# Patient Record
Sex: Male | Born: 1965 | Race: White | Hispanic: No | Marital: Married | Smoking: Never smoker
Health system: Southern US, Community
[De-identification: ages and names within clinical notes are randomized; demographics above are authoritative.]

## PROBLEM LIST (undated history)

## (undated) DIAGNOSIS — D700 Congenital agranulocytosis: Secondary | ICD-10-CM

## (undated) DIAGNOSIS — K219 Gastro-esophageal reflux disease without esophagitis: Secondary | ICD-10-CM

## (undated) DIAGNOSIS — T7840XA Allergy, unspecified, initial encounter: Secondary | ICD-10-CM

## (undated) DIAGNOSIS — Z79899 Other long term (current) drug therapy: Secondary | ICD-10-CM

## (undated) DIAGNOSIS — G546 Phantom limb syndrome with pain: Secondary | ICD-10-CM

## (undated) DIAGNOSIS — I10 Essential (primary) hypertension: Secondary | ICD-10-CM

## (undated) HISTORY — DX: Other long term (current) drug therapy: Z79.899

## (undated) HISTORY — DX: Congenital agranulocytosis: D70.0

## (undated) HISTORY — DX: Gastro-esophageal reflux disease without esophagitis: K21.9

## (undated) HISTORY — PX: AMPUTATION: SHX166

## (undated) HISTORY — DX: Allergy, unspecified, initial encounter: T78.40XA

## (undated) HISTORY — DX: Phantom limb syndrome with pain: G54.6

## (undated) HISTORY — DX: Essential (primary) hypertension: I10

## (undated) HISTORY — PX: APPENDECTOMY: SHX54

---

## 1990-08-27 DIAGNOSIS — D700 Congenital agranulocytosis: Secondary | ICD-10-CM | POA: Insufficient documentation

## 1991-08-02 HISTORY — PX: LEG AMPUTATION THROUGH KNEE: SHX696

## 1991-08-02 HISTORY — PX: ABOVE ELBOW ARM AMPUTATION: SUR27

## 1992-08-01 HISTORY — PX: COLON SURGERY: SHX602

## 2013-08-21 ENCOUNTER — Ambulatory Visit: Payer: Self-pay | Admitting: Family Medicine

## 2014-09-24 DIAGNOSIS — J029 Acute pharyngitis, unspecified: Secondary | ICD-10-CM | POA: Diagnosis not present

## 2014-10-14 DIAGNOSIS — D700 Congenital agranulocytosis: Secondary | ICD-10-CM | POA: Diagnosis not present

## 2014-12-17 DIAGNOSIS — J302 Other seasonal allergic rhinitis: Secondary | ICD-10-CM | POA: Diagnosis not present

## 2014-12-17 DIAGNOSIS — K219 Gastro-esophageal reflux disease without esophagitis: Secondary | ICD-10-CM | POA: Diagnosis not present

## 2014-12-17 DIAGNOSIS — I10 Essential (primary) hypertension: Secondary | ICD-10-CM | POA: Diagnosis not present

## 2014-12-17 DIAGNOSIS — D700 Congenital agranulocytosis: Secondary | ICD-10-CM | POA: Diagnosis not present

## 2014-12-17 DIAGNOSIS — G547 Phantom limb syndrome without pain: Secondary | ICD-10-CM | POA: Diagnosis not present

## 2015-02-22 ENCOUNTER — Other Ambulatory Visit: Payer: Self-pay | Admitting: Family Medicine

## 2015-03-10 DIAGNOSIS — H5500 Unspecified nystagmus: Secondary | ICD-10-CM | POA: Diagnosis not present

## 2015-03-31 ENCOUNTER — Other Ambulatory Visit: Payer: Self-pay | Admitting: Family Medicine

## 2015-04-22 ENCOUNTER — Ambulatory Visit: Payer: Self-pay | Admitting: Family Medicine

## 2015-04-27 ENCOUNTER — Ambulatory Visit (INDEPENDENT_AMBULATORY_CARE_PROVIDER_SITE_OTHER): Payer: Commercial Managed Care - HMO | Admitting: Family Medicine

## 2015-04-27 ENCOUNTER — Encounter (INDEPENDENT_AMBULATORY_CARE_PROVIDER_SITE_OTHER): Payer: Self-pay

## 2015-04-27 ENCOUNTER — Encounter: Payer: Self-pay | Admitting: Family Medicine

## 2015-04-27 VITALS — BP 130/80 | HR 73 | Temp 98.8°F | Resp 16 | Ht 68.0 in | Wt 172.0 lb

## 2015-04-27 DIAGNOSIS — I10 Essential (primary) hypertension: Secondary | ICD-10-CM | POA: Diagnosis not present

## 2015-04-27 DIAGNOSIS — Z23 Encounter for immunization: Secondary | ICD-10-CM | POA: Diagnosis not present

## 2015-04-27 DIAGNOSIS — K219 Gastro-esophageal reflux disease without esophagitis: Secondary | ICD-10-CM | POA: Insufficient documentation

## 2015-04-27 DIAGNOSIS — J302 Other seasonal allergic rhinitis: Secondary | ICD-10-CM

## 2015-04-27 MED ORDER — ENALAPRIL MALEATE 5 MG PO TABS: 5.0000 mg | ORAL_TABLET | Freq: Every day | ORAL | Status: DC

## 2015-04-27 NOTE — Patient Instructions (Signed)
Continue current meds 

## 2015-04-27 NOTE — Progress Notes (Signed)
Name: Brandon Myers   MRN: [id number]    DOB: [birth year]   Date:04/27/2015       Progress Note  Subjective  Chief Complaint  Chief Complaint  Patient presents with  . Hypertension    HPI  Here for f/u of HBP.  Not checked at ome.  He is taking BP med. And feels well.  Phantom pain still doing ok with Gabapentin. No problem-specific assessment & plan notes found for this encounter.   Past Medical History  Diagnosis Date  . Hypertension   . Allergy   . GERD (gastroesophageal reflux disease)   . Neutropenia, congenital     Social History  Substance Use Topics  . Smoking status: Never Smoker   . Smokeless tobacco: Never Used  . Alcohol Use: No     Current outpatient prescriptions:  .  acetaminophen-codeine (TYLENOL #3) 300-30 MG per tablet, Take 1 tablet by mouth at bedtime., Disp: , Rfl: 3 .  doxylamine, Sleep, (UNISOM) 25 MG tablet, Take 25 mg by mouth at bedtime., Disp: , Rfl:  .  enalapril (VASOTEC) 5 MG tablet, TAKE 1 TABLET BY MOUTH DAILY., Disp: 30 tablet, Rfl: 6 .  Filgrastim-sndz (ZARXIO) 480 MCG/0.8ML SOSY, Inject 0.8 mLs into the muscle every other day., Disp: , Rfl:  .  fluticasone (FLONASE) 50 MCG/ACT nasal spray, Place 2 sprays into both nostrils daily., Disp: , Rfl:  .  gabapentin (NEURONTIN) 800 MG tablet, TAKE 1 TABLET BY MOUTH THREE TIMES DAILY, Disp: 90 tablet, Rfl: 6 .  omeprazole (PRILOSEC) 40 MG capsule, Take 40 mg by mouth daily., Disp: , Rfl:  .  VENTOLIN HFA 108 (90 BASE) MCG/ACT inhaler, Inhale 2 puffs into the lungs as needed., Disp: , Rfl: 5  Allergies  Allergen Reactions  . Prochlorperazine     Other reaction(s): Other (See Comments) As a child unable to recall  . Sulfa Antibiotics     Other reaction(s): Other (See Comments) As a child unable to recall  . Strawberry Rash    Review of Systems  Constitutional: Negative for fever, chills, weight loss and malaise/fatigue.  HENT: Negative for hearing loss.   Eyes: Negative for blurred vision  and double vision.  Respiratory: Negative for cough, sputum production, shortness of breath and wheezing.   Cardiovascular: Negative for chest pain, palpitations, orthopnea and leg swelling.  Gastrointestinal: Negative for heartburn, nausea, vomiting, abdominal pain and blood in stool.  Genitourinary: Negative for dysuria, urgency and frequency.  Neurological: Negative for weakness and headaches.      Objective  Filed Vitals:   04/27/15 1340  BP: 156/83  Pulse: 73  Temp: 98.8 F (37.1 C)  TempSrc: Oral  Resp: 16  Height:  (1.727 m)  Weight: 172 lb (78.019 kg)     Physical Exam  Constitutional: He is oriented to person, place, and time and well-developed, well-nourished, and in no distress. No distress.  HENT:  Head: Normocephalic and atraumatic.  Eyes: Conjunctivae and EOM are normal. Pupils are equal, round, and reactive to light. No scleral icterus.  Neck: Normal range of motion. Neck supple. Carotid bruit is not present. No thyromegaly present.  Cardiovascular: Normal rate, regular rhythm, normal heart sounds and intact distal pulses.  Exam reveals no gallop and no friction rub.   No murmur heard. Pulmonary/Chest: Effort normal and breath sounds normal. No respiratory distress. He has no wheezes. He has no rales.  Abdominal: Soft. Bowel sounds are normal. He exhibits no distension, no abdominal bruit and no mass.  There is no tenderness.  Musculoskeletal: He exhibits no edema (Left).  R. Arm surgically absent above the elbow.  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  Lymphadenopathy:    He has no cervical adenopathy.  Neurological: He is alert and oriented to person, place, and time.  Vitals reviewed.     No results found for this or any previous visit (from the past 2160 hour(s)).   Assessment & Plan  1. Essential hypertension, malignant  - enalapril (VASOTEC) 5 MG tablet; Take 1 tablet (5 mg total) by mouth daily.  Dispense: 90  tablet; Refill: 3  2. Seasonal allergic rhinitis -stable  3. Gastroesophageal reflux disease, esophagitis presence not specified -cont. Current meds.  4. Need for influenza vaccination  - Flu Vaccine QUAD 36+ mos PF IM (Fluarix & Fluzone Quad PF)

## 2015-06-09 ENCOUNTER — Other Ambulatory Visit: Payer: Self-pay | Admitting: Family Medicine

## 2015-07-15 ENCOUNTER — Ambulatory Visit (INDEPENDENT_AMBULATORY_CARE_PROVIDER_SITE_OTHER): Payer: Commercial Managed Care - HMO | Admitting: Family Medicine

## 2015-07-15 ENCOUNTER — Encounter: Payer: Self-pay | Admitting: Family Medicine

## 2015-07-15 VITALS — BP 152/92 | HR 68 | Temp 97.5°F | Resp 16 | Ht 68.0 in | Wt 176.0 lb

## 2015-07-15 DIAGNOSIS — J069 Acute upper respiratory infection, unspecified: Secondary | ICD-10-CM

## 2015-07-15 DIAGNOSIS — I10 Essential (primary) hypertension: Secondary | ICD-10-CM | POA: Diagnosis not present

## 2015-07-15 DIAGNOSIS — J209 Acute bronchitis, unspecified: Secondary | ICD-10-CM

## 2015-07-15 MED ORDER — OXYMETAZOLINE HCL 0.05 % NA SOLN: 2.0000 | Freq: Two times a day (BID) | NASAL | Status: DC

## 2015-07-15 MED ORDER — BENZONATATE 100 MG PO CAPS: 100.0000 mg | ORAL_CAPSULE | Freq: Three times a day (TID) | ORAL | Status: DC | PRN

## 2015-07-15 MED ORDER — AZITHROMYCIN 250 MG PO TABS: ORAL_TABLET | ORAL | Status: DC

## 2015-07-15 NOTE — Assessment & Plan Note (Addendum)
Bp elevated likely due to systemic decongestant use. Pt encouraged to watch blood pressure carefully when using any sort of decongestant. Advised topically decongestant only such as Afrin.

## 2015-07-15 NOTE — Progress Notes (Signed)
Subjective:    Patient ID: Brandon Myers, male    DOB: 1966-07-23, 48 y.o.   MRN: [id number]  HPI: Brandon Myers is a 49 y.o. male presenting on 07/15/2015 for Nasal Congestion   HPI  Pt presents for nasal congestion and chest tightness. Symptoms began about last Monday (12/5)- first symptom was sinus drainage and congestion. Facial pressure and pain- relieved by sudafed. He feels symptoms have progressed to his chest- hard to take deep breath, chest is tight. No SOB.  Lots of sinus drainage. Some dry cough.  Home treatment: Sudafed, wal-tussin chest congestion.  Mild relief.   Past Medical History  Diagnosis Date  . Hypertension   . Allergy   . GERD (gastroesophageal reflux disease)   . Neutropenia, congenital (HCC)     Current Outpatient Prescriptions on File Prior to Visit  Medication Sig  . acetaminophen-codeine (TYLENOL #3) 300-30 MG tablet TAKE 1 TABLET BY MOUTH THREE TIMES DAILY AS NEEDED.  Marland Kitchen doxylamine, Sleep, (UNISOM) 25 MG tablet Take 25 mg by mouth at bedtime.  . enalapril (VASOTEC) 5 MG tablet Take 1 tablet (5 mg total) by mouth daily.  Seward Speck (ZARXIO) 480 MCG/0.8ML SOSY Inject 0.8 mLs into the muscle every other day.  . fluticasone (FLONASE) 50 MCG/ACT nasal spray Place 2 sprays into both nostrils daily.  Marland Kitchen gabapentin (NEURONTIN) 800 MG tablet TAKE 1 TABLET BY MOUTH THREE TIMES DAILY  . omeprazole (PRILOSEC) 40 MG capsule Take 40 mg by mouth daily.  . VENTOLIN HFA 108 (90 BASE) MCG/ACT inhaler Inhale 2 puffs into the lungs as needed.   No current facility-administered medications on file prior to visit.    Review of Systems  Constitutional: Negative for fever and chills.  HENT: Positive for congestion, rhinorrhea, sinus pressure and sore throat. Negative for ear pain, postnasal drip and sneezing.   Respiratory: Positive for cough and chest tightness. Negative for shortness of breath and wheezing.   Cardiovascular: Negative for chest pain, palpitations  and leg swelling.  Gastrointestinal: Negative for nausea and vomiting.  Musculoskeletal: Negative for neck pain and neck stiffness.  Skin: Negative.   Allergic/Immunologic: Positive for environmental allergies.  Neurological: Negative for headaches.   Per HPI unless specifically indicated above     Objective:    BP 152/92 mmHg  Pulse 68  Temp(Src) 97.5 F (36.4 C) (Oral)  Resp 16  Ht  (1.727 m)  Wt 176 lb (79.833 kg)  BMI 26.77 kg/m2  Wt Readings from Last 3 Encounters:  07/15/15 176 lb (79.833 kg)  04/27/15 172 lb (78.019 kg)    Physical Exam  Constitutional: He appears well-developed and well-nourished. No distress.  HENT:  Head: Normocephalic and atraumatic.  Right Ear: Hearing and tympanic membrane normal. Tympanic membrane is not erythematous and not bulging.  Left Ear: Hearing and tympanic membrane normal. Tympanic membrane is not erythematous and not bulging.  Nose: Mucosal edema and rhinorrhea present. No sinus tenderness. Right sinus exhibits no maxillary sinus tenderness and no frontal sinus tenderness. Left sinus exhibits no maxillary sinus tenderness and no frontal sinus tenderness.  Mouth/Throat: Uvula is midline and mucous membranes are normal. No uvula swelling. Posterior oropharyngeal erythema present. No posterior oropharyngeal edema.  Neck: Normal range of motion. Neck supple. No Brudzinski's sign and no Kernig's sign noted.  Cardiovascular: Normal rate, regular rhythm and normal heart sounds.   Pulmonary/Chest: No accessory muscle usage. No tachypnea. No respiratory distress. He has no wheezes. He has rhonchi (clear with coughing. )  in the right lower field and the left lower field. He has no rales.  Lymphadenopathy:    He has no cervical adenopathy.   No results found for this or any previous visit.    Assessment & Plan:   Problem List Items Addressed This Visit      Cardiovascular and Mediastinum   Essential hypertension, malignant    Bp  elevated likely due to systemic decongestant use. Pt encouraged to watch blood pressure carefully when using any sort of decongestant. Advised topically decongestant only such as Afrin.        Other Visit Diagnoses    Acute bronchitis, unspecified organism    -  Primary    Given duration of symptoms and clinical exam: treat with antibiotic- Zpak. Supportive care at home. Inhaler for chest tightness. Alarm symptoms reviewed.    Upper respiratory infection        Advised againt systemic decongestant due to history of hypertension. Topical decongestant such as AFrin suggested.     Relevant Medications    azithromycin (ZITHROMAX) 250 MG tablet       Meds ordered this encounter  Medications  . azithromycin (ZITHROMAX) 250 MG tablet    Sig: Dispense as Zpak: Take 2 pills today and 1 pill everyday until the bottle is empty.    Dispense:  6 tablet    Refill:  0    Order Specific Question:  Supervising Provider    Answer:  Janeann ForehandHAWKINS JR, JAMES H [[id number]]  . benzonatate (TESSALON) 100 MG capsule    Sig: Take 1 capsule (100 mg total) by mouth 3 (three) times daily as needed for cough.    Dispense:  30 capsule    Refill:  0    Order Specific Question:  Supervising Provider    Answer:  Janeann ForehandHAWKINS JR, JAMES H [[id number]]  . oxymetazoline (AFRIN NASAL SPRAY) 0.05 % nasal spray    Sig: Place 2 sprays into both nostrils 2 (two) times daily.    Dispense:  30 mL    Refill:  0    Order Specific Question:  Supervising Provider    Answer:  Janeann ForehandHAWKINS JR, JAMES H [[id number]]      Follow up plan: Return if symptoms worsen or fail to improve.

## 2015-07-15 NOTE — Patient Instructions (Signed)
You can use supportive care at home to help with your symptoms.You have generic Mucinex DM at home to help break up the congestion and soothe your cough. You can takes this twice daily.  I have also sent tesslon perles to your pharmacy to help with the cough- you can take these 3 times daily as needed. Honey is a natural cough suppressant- so add it to your tea in the morning.  If you have a humidifer, set that up in your bedroom at night.   Please seek immediate medical attention if you develop shortness of breath not relieve by inhaler, chest pain/tightness, fever > 103 F or other concerning symptoms.

## 2015-07-20 ENCOUNTER — Other Ambulatory Visit: Payer: Self-pay | Admitting: Family Medicine

## 2015-07-20 NOTE — Telephone Encounter (Signed)
Patient completed abx. Patient would like to be seen again. Patient scheduled for 07/21/15.

## 2015-07-20 NOTE — Telephone Encounter (Signed)
Pt. Wife  called wanted to  Know if  Pt could get something stronger    Pt was given  A z-pac pt call back # is  [phone]

## 2015-07-20 NOTE — Telephone Encounter (Signed)
Please let him know that the Zpak is the best antibiotic for the symptoms he presented with- it will cover for most lung infections including pneumonia. It is still active in his system for 5 days after he finishes the antibiotic- he should have finished today. If he is not having a fever, the infection is likely gone. The cough and congestion can linger after the infection is gone. Continue with mucinex DM OTC for cough. Humified air.  If he develops a fever, chest tightness despite inhaler use, he probably needs to be seen again. If symptoms are worsening or not better on Wednesday/thursday he may need to be seen again prior to the holiday.  Thanks! AK

## 2015-07-21 ENCOUNTER — Ambulatory Visit
Admission: RE | Admit: 2015-07-21 | Discharge: 2015-07-21 | Disposition: A | Discharge status: Home / Self Care | Payer: Commercial Managed Care - HMO | Source: Ambulatory Visit | Attending: Family Medicine | Admitting: Family Medicine

## 2015-07-21 ENCOUNTER — Ambulatory Visit (INDEPENDENT_AMBULATORY_CARE_PROVIDER_SITE_OTHER): Payer: Commercial Managed Care - HMO | Admitting: Family Medicine

## 2015-07-21 ENCOUNTER — Encounter: Payer: Self-pay | Admitting: Family Medicine

## 2015-07-21 DIAGNOSIS — R059 Cough, unspecified: Secondary | ICD-10-CM

## 2015-07-21 DIAGNOSIS — R0602 Shortness of breath: Secondary | ICD-10-CM | POA: Diagnosis not present

## 2015-07-21 DIAGNOSIS — D700 Congenital agranulocytosis: Secondary | ICD-10-CM

## 2015-07-21 DIAGNOSIS — J9811 Atelectasis: Secondary | ICD-10-CM | POA: Diagnosis not present

## 2015-07-21 DIAGNOSIS — R05 Cough: Secondary | ICD-10-CM

## 2015-07-21 MED ORDER — LEVOFLOXACIN 500 MG PO TABS: 500.0000 mg | ORAL_TABLET | Freq: Every day | ORAL | Status: DC

## 2015-07-21 MED ORDER — PREDNISONE 20 MG PO TABS: 40.0000 mg | ORAL_TABLET | Freq: Every day | ORAL | Status: DC

## 2015-07-21 NOTE — Progress Notes (Signed)
Subjective:    Patient ID: Brandon GrapesKevin L Eddie, male    DOB: 06/08/1966, 49 y.o.   MRN: [id number]  HPI: Brandon Myers is a 49 y.o. male presenting on 07/21/2015 for Cough   HPI  Pt was seen last week for cough/congestion bronchitis. Finished Zpak on Sunday. Still c/o shortness of breath. Cough with productive sputum- brown. Fevers unknown. Given inhaler last week- 4 times used yesterday and twice today. Using mucinex and tessalon perles at home.   Past Medical History  Diagnosis Date  . Hypertension   . Allergy   . GERD (gastroesophageal reflux disease)   . Neutropenia, congenital (HCC)     Current Outpatient Prescriptions on File Prior to Visit  Medication Sig  . acetaminophen-codeine (TYLENOL #3) 300-30 MG tablet TAKE 1 TABLET BY MOUTH THREE TIMES DAILY AS NEEDED.  . benzonatate (TESSALON) 100 MG capsule Take 1 capsule (100 mg total) by mouth 3 (three) times daily as needed for cough.  . doxylamine, Sleep, (UNISOM) 25 MG tablet Take 25 mg by mouth at bedtime.  . enalapril (VASOTEC) 5 MG tablet Take 1 tablet (5 mg total) by mouth daily.  Seward Speck. Filgrastim-sndz (ZARXIO) 480 MCG/0.8ML SOSY Inject 0.8 mLs into the muscle every other day.  . fluticasone (FLONASE) 50 MCG/ACT nasal spray Place 2 sprays into both nostrils daily.  Marland Kitchen. gabapentin (NEURONTIN) 800 MG tablet TAKE 1 TABLET BY MOUTH THREE TIMES DAILY  . omeprazole (PRILOSEC) 40 MG capsule Take 40 mg by mouth daily.  Marland Kitchen. oxymetazoline (AFRIN NASAL SPRAY) 0.05 % nasal spray Place 2 sprays into both nostrils 2 (two) times daily.  . VENTOLIN HFA 108 (90 BASE) MCG/ACT inhaler Inhale 2 puffs into the lungs as needed.   No current facility-administered medications on file prior to visit.    Review of Systems  Constitutional: Negative for fever and chills.  HENT: Positive for congestion and sinus pressure. Negative for dental problem, postnasal drip and sore throat.   Respiratory: Positive for cough, chest tightness, shortness of breath and  wheezing.   Cardiovascular: Negative for chest pain, palpitations and leg swelling.  Gastrointestinal: Negative.   Genitourinary: Negative.   Musculoskeletal: Negative for neck pain and neck stiffness.  Neurological: Negative for dizziness, light-headedness and numbness.  Psychiatric/Behavioral: Negative.    Per HPI unless specifically indicated above     Objective:    BP 138/94 mmHg  Pulse 108  Temp(Src) 98.2 F (36.8 C) (Oral)  Resp 16  Ht 5\' 8"  (1.727 m)  Wt 177 lb (80.287 kg)  BMI 26.92 kg/m2  SpO2 98%  Wt Readings from Last 3 Encounters:  07/21/15 177 lb (80.287 kg)  07/15/15 176 lb (79.833 kg)  04/27/15 172 lb (78.019 kg)    Physical Exam  Constitutional: He appears well-developed and well-nourished. No distress.  HENT:  Head: Normocephalic and atraumatic.  Nose: Mucosal edema and rhinorrhea present. Right sinus exhibits no maxillary sinus tenderness and no frontal sinus tenderness. Left sinus exhibits no maxillary sinus tenderness and no frontal sinus tenderness.  Mouth/Throat: Uvula is midline and oropharynx is clear and moist.  Neck: Normal range of motion. Neck supple.  Cardiovascular: Normal rate, regular rhythm and normal heart sounds.  Exam reveals no gallop and no friction rub.   No murmur heard. Pulmonary/Chest: No respiratory distress. He has decreased breath sounds in the right middle field and the right lower field. He has wheezes in the right upper field. He has no rhonchi. He has no rales. Chest wall is dull to  percussion (RLL). He exhibits no mass and no tenderness.  Lymphadenopathy:    He has no cervical adenopathy.  Skin: He is diaphoretic.  Psychiatric: His speech is normal and behavior is normal. Judgment and thought content normal. His mood appears anxious. Cognition and memory are normal.   No results found for this or any previous visit.    Assessment & Plan:   Problem List Items Addressed This Visit      Other   Congenital neutropenia  (HCC)    Other Visit Diagnoses    Cough        Prednisone  once daily for cough and inflammation.     Relevant Orders    DG Chest 2 View (Completed)    Atelectasis of right lung        CXR shows atelectatsis of R lung. Given decreased lung sounds and increasing sputum and immunosupression, treat for pneumonia. Discussed risks of Levaquin administration so close to finishing Zpak. Medication discussed with clinical pharmacist at [facility]- risk is still present, however given clinical presentation we will treat. Risk and symptoms to watch out for discussed with patient.   Risks vs. Benefits of levaquin in this case discussed with supervising physician.     Relevant Medications    predniSONE (DELTASONE) 20 MG tablet    levofloxacin (LEVAQUIN) 500 MG tablet    Other Relevant Orders    DG Chest 2 View (Completed)       Meds ordered this encounter  Medications  . predniSONE (DELTASONE) 20 MG tablet    Sig: Take 2 tablets (40 mg total) by mouth daily with breakfast.    Dispense:  10 tablet    Refill:  0    Order Specific Question:  Supervising Provider    Answer:  Janeann Forehand [phone]  . levofloxacin (LEVAQUIN) 500 MG tablet    Sig: Take 1 tablet (500 mg total) by mouth daily.    Dispense:  7 tablet    Refill:  0    Order Specific Question:  Supervising Provider    Answer:  Janeann Forehand [[id number]]      Follow up plan: Return if symptoms worsen or fail to improve.

## 2015-07-21 NOTE — Patient Instructions (Signed)
Please seek immediate medical attention if you develop shortness of breath not relieve by inhaler, chest pain/tightness, fever > 103 F or other concerning symptoms.   Please seek immediate medical attention at ER or Urgent Care if you develop: Chest pain, pressure or tightness. Shortness of breath accompanied by nausea or diaphoresis Visual changes Numbness or tingling on one side of the body Facial droop Altered mental status Or any concerning symptoms.  Please be aware of the risk of taking levaquin so close to the azithromycin. It can cause funny heart rhythms. I have spoken with a pharmacist and it should be okay taken together but there is still a risk. If you develop chest pain, SOB, dizziness or pass out, please go to the ER.

## 2015-08-12 ENCOUNTER — Telehealth: Payer: Self-pay | Admitting: Family Medicine

## 2015-08-12 NOTE — Telephone Encounter (Signed)
Pt needs a refill on acetaminophem-codeine [place] [facility] [place].  He is out

## 2015-08-12 NOTE — Telephone Encounter (Signed)
Let patient know that Dr. Juanetta GoslingHawkins out of office today.

## 2015-08-13 MED ORDER — ACETAMINOPHEN-CODEINE #3 300-30 MG PO TABS: 1.0000 | ORAL_TABLET | Freq: Two times a day (BID) | ORAL | Status: DC

## 2015-08-13 NOTE — Telephone Encounter (Signed)
I will write limited supply until he conmes in for next office visit.-jh

## 2015-08-13 NOTE — Telephone Encounter (Signed)
Notified patient RX ready for p/u.

## 2015-08-31 ENCOUNTER — Ambulatory Visit (INDEPENDENT_AMBULATORY_CARE_PROVIDER_SITE_OTHER): Payer: Commercial Managed Care - HMO | Admitting: Family Medicine

## 2015-08-31 ENCOUNTER — Encounter: Payer: Self-pay | Admitting: Family Medicine

## 2015-08-31 VITALS — BP 135/80 | HR 82 | Temp 98.2°F | Resp 16 | Ht 68.0 in | Wt 180.0 lb

## 2015-08-31 DIAGNOSIS — Z899 Acquired absence of limb, unspecified: Secondary | ICD-10-CM

## 2015-08-31 DIAGNOSIS — S88919A Complete traumatic amputation of unspecified lower leg, level unspecified, initial encounter: Secondary | ICD-10-CM | POA: Insufficient documentation

## 2015-08-31 DIAGNOSIS — K219 Gastro-esophageal reflux disease without esophagitis: Secondary | ICD-10-CM | POA: Diagnosis not present

## 2015-08-31 DIAGNOSIS — I10 Essential (primary) hypertension: Secondary | ICD-10-CM | POA: Diagnosis not present

## 2015-08-31 DIAGNOSIS — M778 Other enthesopathies, not elsewhere classified: Secondary | ICD-10-CM | POA: Diagnosis not present

## 2015-08-31 DIAGNOSIS — Z89201 Acquired absence of right upper limb, unspecified level: Secondary | ICD-10-CM

## 2015-08-31 DIAGNOSIS — S48911A Complete traumatic amputation of right shoulder and upper arm, level unspecified, initial encounter: Secondary | ICD-10-CM | POA: Insufficient documentation

## 2015-08-31 MED ORDER — ENALAPRIL MALEATE 5 MG PO TABS: 5.0000 mg | ORAL_TABLET | Freq: Every day | ORAL | Status: DC

## 2015-08-31 MED ORDER — ACETAMINOPHEN-CODEINE #3 300-30 MG PO TABS: ORAL_TABLET | ORAL | Status: DC

## 2015-08-31 MED ORDER — OMEPRAZOLE 40 MG PO CPDR: 40.0000 mg | DELAYED_RELEASE_CAPSULE | Freq: Every day | ORAL | Status: DC

## 2015-08-31 MED ORDER — MELOXICAM 15 MG PO TABS: 15.0000 mg | ORAL_TABLET | Freq: Every day | ORAL | Status: DC

## 2015-08-31 NOTE — Progress Notes (Signed)
Name: Brandon Myers   MRN: [id number]    DOB: 04-29-1966   Date:08/31/2015       Progress Note  Subjective  Chief Complaint  Chief Complaint  Patient presents with  . Hypertension    HPI Here for f/u of HBP.  Has hx. 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  No problem-specific assessment & plan notes found for this encounter.   Past Medical History  Diagnosis Date  . Hypertension   . Allergy   . GERD (gastroesophageal reflux disease)   . Neutropenia, congenital [facility])     Past Surgical History  Procedure Laterality Date  . Amputation    . Appendectomy      Family History  Problem Relation Age of Onset  . Heart Problems Mother   . Stroke Maternal Grandmother     Social History   Social History  . Marital Status: Married    Spouse Name: N/A  . Number of Children: N/A  . Years of Education: N/A   Occupational History  . Not on file.   Social History Main Topics  . Smoking status: Never Smoker   . Smokeless tobacco: Never Used  . Alcohol Use: No  . Drug Use: No  . Sexual Activity: Not on file   Other Topics Concern  . Not on file   Social History Narrative     Current outpatient prescriptions:  .  acetaminophen-codeine (TYLENOL #3) 300-30 MG tablet, Take 1 tablet at bedtime as needed for pain, Disp: 30 tablet, Rfl: 4 .  enalapril (VASOTEC) 5 MG tablet, Take 1 tablet (5 mg total) by mouth daily., Disp: 90 tablet, Rfl: 3 .  Filgrastim-sndz (ZARXIO) 480 MCG/0.8ML SOSY, Inject 0.8 mLs into the muscle every other day., Disp: , Rfl:  .  fluticasone (FLONASE) 50 MCG/ACT nasal spray, Place 2 sprays into both nostrils daily., Disp: , Rfl:  .  gabapentin (NEURONTIN) 800 MG tablet, TAKE 1 TABLET BY MOUTH THREE TIMES DAILY, Disp: 90 tablet, Rfl: 6 .  omeprazole (PRILOSEC) 40 MG capsule, Take 1 capsule (40 mg total) by mouth daily., Disp: 90 capsule, Rfl: 3 .  VENTOLIN HFA 108 (90 BASE) MCG/ACT inhaler, Inhale 2 puffs into the lungs as needed., Disp: , Rfl:  5 .  meloxicam (MOBIC) 15 MG tablet, Take 1 tablet (15 mg total) by mouth daily., Disp: 30 tablet, Rfl: 0  Allergies  Allergen Reactions  . Prochlorperazine     Other reaction(s): Other (See Comments) As a child unable to recall  . Sulfa Antibiotics     Other reaction(s): Other (See Comments) As a child unable to recall  . Strawberry Extract Rash     Review of Systems  Constitutional: Negative for fever, chills, weight loss and malaise/fatigue.  HENT: Negative for hearing loss.   Eyes: Negative for blurred vision and double vision.  Respiratory: Negative for cough, shortness of breath and wheezing.   Cardiovascular: Negative for chest pain, palpitations and leg swelling.  Gastrointestinal: Negative for heartburn, abdominal pain and blood in stool.  Genitourinary: Negative for dysuria, urgency and frequency.  Musculoskeletal:       Throbbing pain of R stump after taking prosthesis off.  C/o some pain in L wrist with movement over past 2 weeks.  Skin: Negative for rash.  Neurological: Negative for dizziness, tremors, weakness and headaches.      Objective  Filed Vitals:   08/31/15 1334 08/31/15 1400  BP: 137/91 135/80  Pulse: 82   Temp: 98.2  F (36.8 C)   TempSrc: Oral   Resp: 16   Height:  (1.727 m)   Weight: 180 lb (81.647 kg)     Physical Exam  Constitutional: He is oriented to person, place, and time and well-developed, well-nourished, and in no distress. No distress.  HENT:  Head: Normocephalic and atraumatic.  Eyes: Conjunctivae and EOM are normal. Pupils are equal, round, and reactive to light. No scleral icterus.  Neck: Normal range of motion. Neck supple. Carotid bruit is not present. No thyromegaly present.  Cardiovascular: Normal rate, regular rhythm and normal heart sounds.  Exam reveals no gallop and no friction rub.   No murmur heard. Pulmonary/Chest: Effort normal and breath sounds normal. No respiratory distress. He has no wheezes. He has no  rales.  Abdominal: Soft. Bowel sounds are normal. He exhibits no distension and no mass. There is no tenderness.  Musculoskeletal: He exhibits no edema.  S/p amputation of R arm above elbow and R leg above knee.  Lymphadenopathy:    He has no cervical adenopathy.  Neurological: He is alert and oriented to person, place, and time.  Vitals reviewed.      No results found for this or any previous visit (from the past 2160 hour(s)).   Assessment & Plan  Problem List Items Addressed This Visit      Cardiovascular and Mediastinum   Essential hypertension, malignant   Relevant Medications   enalapril (VASOTEC) 5 MG tablet     Digestive   Esophageal reflux   Relevant Medications   omeprazole (PRILOSEC) 40 MG capsule     Musculoskeletal and Integument   Tendonitis of wrist, left   Relevant Medications   meloxicam (MOBIC) 15 MG tablet     Other   Amputation of arm, right (HCC) - Primary   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Relevant Medications   acetaminophen-codeine (TYLENOL #3) 300-30 MG tablet      Meds ordered this encounter  Medications  . acetaminophen-codeine (TYLENOL #3) 300-30 MG tablet    Sig: Take 1 tablet at bedtime as needed for pain    Dispense:  30 tablet    Refill:  4  . meloxicam (MOBIC) 15 MG tablet    Sig: Take 1 tablet (15 mg total) by mouth daily.    Dispense:  30 tablet    Refill:  0  . enalapril (VASOTEC) 5 MG tablet    Sig: Take 1 tablet (5 mg total) by mouth daily.    Dispense:  90 tablet    Refill:  3  . omeprazole (PRILOSEC) 40 MG capsule    Sig: Take 1 capsule (40 mg total) by mouth daily.    Dispense:  90 capsule    Refill:  3  1. Amputation of arm, right (HCC) Cont. Gabapentin  2. 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- acetaminophen-codeine (TYLENOL #3) 300-30 MG tablet; Take 1 tablet at bedtime as needed for pain  Dispense: 30 tablet; Refill: 4  3. Essential hypertension, malignant  - enalapril (VASOTEC) 5 MG tablet; Take 1 tablet (5 mg total) by mouth daily.   Dispense: 90 tablet; Refill: 3  4. Gastroesophageal reflux disease, esophagitis presence not specified  - omeprazole (PRILOSEC) 40 MG capsule; Take 1 capsule (40 mg total) by mouth daily.  Dispense: 90 capsule; Refill: 3  5. Tendonitis of wrist, left  - meloxicam (MOBIC) 15 MG tablet; Take 1 tablet (15 mg total) by mouth daily.  Dispense: 30 tablet; Refill: 0

## 2015-10-01 ENCOUNTER — Other Ambulatory Visit: Payer: Self-pay | Admitting: Family Medicine

## 2015-10-29 ENCOUNTER — Encounter: Payer: Self-pay | Admitting: Family Medicine

## 2015-10-29 ENCOUNTER — Ambulatory Visit (INDEPENDENT_AMBULATORY_CARE_PROVIDER_SITE_OTHER): Payer: Commercial Managed Care - HMO | Admitting: Family Medicine

## 2015-10-29 VITALS — BP 150/90 | HR 87 | Temp 98.6°F | Resp 16 | Ht 68.0 in | Wt 179.0 lb

## 2015-10-29 DIAGNOSIS — G546 Phantom limb syndrome with pain: Secondary | ICD-10-CM | POA: Insufficient documentation

## 2015-10-29 DIAGNOSIS — R69 Illness, unspecified: Principal | ICD-10-CM

## 2015-10-29 DIAGNOSIS — J111 Influenza due to unidentified influenza virus with other respiratory manifestations: Secondary | ICD-10-CM | POA: Diagnosis not present

## 2015-10-29 DIAGNOSIS — J40 Bronchitis, not specified as acute or chronic: Secondary | ICD-10-CM

## 2015-10-29 MED ORDER — AZITHROMYCIN 250 MG PO TABS: ORAL_TABLET | ORAL | Status: AC

## 2015-10-29 MED ORDER — GABAPENTIN 800 MG PO TABS: 800.0000 mg | ORAL_TABLET | Freq: Three times a day (TID) | ORAL | Status: DC

## 2015-10-29 MED ORDER — OSELTAMIVIR PHOSPHATE 75 MG PO CAPS: 75.0000 mg | ORAL_CAPSULE | Freq: Two times a day (BID) | ORAL | Status: AC

## 2015-10-29 NOTE — Progress Notes (Signed)
Name: Brandon Myers   MRN: [id number]    DOB: 03/25/1966   Date:10/29/2015       Progress Note  Subjective  Chief Complaint  Chief Complaint  Patient presents with  . Cough    Congestion, sinus drainage, yellow phlegm; onset of sx Monday    HPI Feeling weak x 2-3 days.  Some aches and headache and some chills.  No fever.  Cough started 2 days ago.   Some yellow phlegm.  + nasal congestion.  Some scratchy throat.  No problem-specific assessment & plan notes found for this encounter.   Past Medical History  Diagnosis Date  . Hypertension   . Allergy   . GERD (gastroesophageal reflux disease)   . Neutropenia, congenital [facility])     Social History  Substance Use Topics  . Smoking status: Never Smoker   . Smokeless tobacco: Never Used  . Alcohol Use: No     Current outpatient prescriptions:  .  acetaminophen-codeine (TYLENOL #3) 300-30 MG tablet, Take 1 tablet at bedtime as needed for pain, Disp: 30 tablet, Rfl: 4 .  enalapril (VASOTEC) 5 MG tablet, Take 1 tablet (5 mg total) by mouth daily., Disp: 90 tablet, Rfl: 3 .  Filgrastim-sndz (ZARXIO) 480 MCG/0.8ML SOSY, Inject 0.8 mLs into the muscle every other day., Disp: , Rfl:  .  fluticasone (FLONASE) 50 MCG/ACT nasal spray, Place 2 sprays into both nostrils daily., Disp: , Rfl:  .  gabapentin (NEURONTIN) 800 MG tablet, Take 1 tablet (800 mg total) by mouth 3 (three) times daily., Disp: 270 tablet, Rfl: 3 .  meloxicam (MOBIC) 15 MG tablet, TAKE 1 TABLET(15 MG) BY MOUTH DAILY, Disp: 30 tablet, Rfl: 6 .  omeprazole (PRILOSEC) 40 MG capsule, Take 1 capsule (40 mg total) by mouth daily., Disp: 90 capsule, Rfl: 3 .  VENTOLIN HFA 108 (90 BASE) MCG/ACT inhaler, Inhale 2 puffs into the lungs as needed., Disp: , Rfl: 5 .  azithromycin (ZITHROMAX) 250 MG tablet, Take 2 tabs on day 1, hen 1 tab daily on days 2-5, Disp: 6 tablet, Rfl: 0 .  oseltamivir (TAMIFLU) 75 MG capsule, Take 1 capsule (75 mg total) by mouth 2 (two) times daily., Disp: 10  capsule, Rfl: 0  Allergies  Allergen Reactions  . Prochlorperazine     Other reaction(s): Other (See Comments) As a child unable to recall  . Sulfa Antibiotics     Other reaction(s): Other (See Comments) As a child unable to recall  . Strawberry Extract Rash    Review of Systems  Constitutional: Positive for chills and malaise/fatigue. Negative for fever and weight loss.  HENT: Positive for congestion.   Eyes: Negative for blurred vision and double vision.  Respiratory: Positive for cough. Negative for shortness of breath and wheezing.   Cardiovascular: Negative for chest pain, palpitations and leg swelling.  Gastrointestinal: Negative for heartburn, abdominal pain and blood in stool.  Genitourinary: Negative for dysuria, urgency and frequency.  Musculoskeletal: Positive for myalgias.  Skin: Negative for rash.  Neurological: Positive for weakness and headaches.      Objective  Filed Vitals:   10/29/15 1433 10/29/15 1454  BP: 139/101 150/90  Pulse: 87   Temp: 98.6 F (37 C)   TempSrc: Oral   Resp: 16   Height:  (1.727 m)   Weight: 179 lb (81.194 kg)   SpO2: 97%      Physical Exam  Constitutional: He is oriented to person, place, and time and well-developed, well-nourished, and in no  distress. No distress.  HENT:  Head: Normocephalic and atraumatic.  Right Ear: External ear normal.  Left Ear: External ear normal.  Nose: Rhinorrhea (clear) present.  Mouth/Throat: Oropharynx is clear and moist.  Eyes: Conjunctivae and EOM are normal. Pupils are equal, round, and reactive to light.  Cardiovascular: Normal rate, regular rhythm and normal heart sounds.   Pulmonary/Chest: Effort normal and breath sounds normal. No respiratory distress. He has no wheezes. He has no rales.  Some coarseness of breath sounds  Neurological: He is alert and oriented to person, place, and time.  Skin: Skin is warm and dry.  Vitals reviewed.     No results found for this or any  previous visit (from the past 2160 hour(s)).   Assessment & Plan  1. Influenza-like illness  - oseltamivir (TAMIFLU) 75 MG capsule; Take 1 capsule (75 mg total) by mouth 2 (two) times daily.  Dispense: 10 capsule; Refill: 0  2. Bronchitis  - azithromycin (ZITHROMAX) 250 MG tablet; Take 2 tabs on day 1, hen 1 tab daily on days 2-5  Dispense: 6 tablet; Refill: 0  3. Phantom limb pain (HCC)  - gabapentin (NEURONTIN) 800 MG tablet; Take 1 tablet (800 mg total) by mouth 3 (three) times daily.  Dispense: 270 tablet; Refill: 3

## 2015-10-29 NOTE — Patient Instructions (Signed)
Use Tylenol for pain, Mucinex DM for cough and Claritin for congestion.  Drink fluids and rest.

## 2015-11-23 ENCOUNTER — Other Ambulatory Visit: Payer: Self-pay | Admitting: Family Medicine

## 2016-01-07 ENCOUNTER — Ambulatory Visit (INDEPENDENT_AMBULATORY_CARE_PROVIDER_SITE_OTHER): Payer: Commercial Managed Care - HMO | Admitting: Family Medicine

## 2016-01-07 ENCOUNTER — Encounter: Payer: Self-pay | Admitting: Family Medicine

## 2016-01-07 VITALS — BP 125/80 | HR 90 | Temp 98.0°F | Resp 16 | Ht 68.0 in | Wt 185.0 lb

## 2016-01-07 DIAGNOSIS — I10 Essential (primary) hypertension: Secondary | ICD-10-CM

## 2016-01-07 DIAGNOSIS — S88919A Complete traumatic amputation of unspecified lower leg, level unspecified, initial encounter: Secondary | ICD-10-CM

## 2016-01-07 DIAGNOSIS — Z899 Acquired absence of limb, unspecified: Secondary | ICD-10-CM

## 2016-01-07 DIAGNOSIS — G546 Phantom limb syndrome with pain: Secondary | ICD-10-CM

## 2016-01-07 MED ORDER — ACETAMINOPHEN-CODEINE #3 300-30 MG PO TABS
ORAL_TABLET | ORAL | Status: DC
Start: 2016-01-07 — End: 2016-07-19

## 2016-01-07 NOTE — Progress Notes (Signed)
Name: Brandon GrapesKevin L Russum   MRN: [id number]    DOB: 01/15/1966   Date:01/07/2016       Progress Note  Subjective  Chief Complaint  Chief Complaint  Patient presents with  . Hypertension    HPI Here for f/u of HBP and phantom pain.  Phantom pain improved with the Gabapentin, but still having some stump pain .  Takes Tylenol #3 for this about every night for thjis stump pain. No problem-specific assessment & plan notes found for this encounter.   Past Medical History  Diagnosis Date  . Hypertension   . Allergy   . GERD (gastroesophageal reflux disease)   . Neutropenia, congenital [facility](HCC)     Past Surgical History  Procedure Laterality Date  . Amputation    . Appendectomy      Family History  Problem Relation Age of Onset  . Heart Problems Mother   . Stroke Maternal Grandmother     Social History   Social History  . Marital Status: Married    Spouse Name: N/A  . Number of Children: N/A  . Years of Education: N/A   Occupational History  . Not on file.   Social History Main Topics  . Smoking status: Never Smoker   . Smokeless tobacco: Never Used  . Alcohol Use: No  . Drug Use: No  . Sexual Activity: Not on file   Other Topics Concern  . Not on file   Social History Narrative     Current outpatient prescriptions:  .  acetaminophen-codeine (TYLENOL #3) 300-30 MG tablet, Take 1 tablet at bedtime as needed for pain, Disp: 30 tablet, Rfl: 3 .  enalapril (VASOTEC) 5 MG tablet, Take 1 tablet (5 mg total) by mouth daily., Disp: 90 tablet, Rfl: 3 .  Filgrastim-sndz (ZARXIO) 480 MCG/0.8ML SOSY, Inject 0.8 mLs into the muscle every other day., Disp: , Rfl:  .  fluticasone (FLONASE) 50 MCG/ACT nasal spray, INSTILL 1 TO 2 SPRAYS IN EACH NOSTRIL EVERY NIGHT AT BEDTIME, Disp: 16 g, Rfl: 12 .  gabapentin (NEURONTIN) 800 MG tablet, Take 1 tablet (800 mg total) by mouth 3 (three) times daily., Disp: 270 tablet, Rfl: 3 .  meloxicam (MOBIC) 15 MG tablet, TAKE 1 TABLET(15 MG) BY MOUTH  DAILY, Disp: 30 tablet, Rfl: 6 .  omeprazole (PRILOSEC) 40 MG capsule, Take 1 capsule (40 mg total) by mouth daily., Disp: 90 capsule, Rfl: 3 .  VENTOLIN HFA 108 (90 BASE) MCG/ACT inhaler, Inhale 2 puffs into the lungs as needed., Disp: , Rfl: 5  Allergies  Allergen Reactions  . Prochlorperazine     Other reaction(s): Other (See Comments) As a child unable to recall  . Sulfa Antibiotics     Other reaction(s): Other (See Comments) As a child unable to recall  . Strawberry Extract Rash     Review of Systems  Constitutional: Negative for fever, chills, weight loss and malaise/fatigue.  HENT: Negative for hearing loss.   Eyes: Negative for blurred vision and double vision.  Respiratory: Negative for cough, shortness of breath and wheezing.   Cardiovascular: Negative for chest pain, palpitations and leg swelling.  Gastrointestinal: Negative for heartburn, abdominal pain and blood in stool.  Genitourinary: Negative for dysuria, urgency and frequency.  Musculoskeletal:       R leg stump pain after removing prosthesis  Skin: Negative for rash.  Neurological: Negative for dizziness, tremors, weakness and headaches.      Objective  Filed Vitals:   01/07/16 1342 01/07/16 1406  BP:  154/90 125/80  Pulse: 90   Temp: 98 F (36.7 C)   TempSrc: Oral   Resp: 16   Height:  (1.727 m)   Weight: 185 lb (83.915 kg)     Physical Exam  Constitutional: He is oriented to person, place, and time and well-developed, well-nourished, and in no distress. No distress.  HENT:  Head: Normocephalic and atraumatic.  Neck: Normal range of motion. Neck supple. Carotid bruit is not present. No thyromegaly present.  Cardiovascular: Normal rate, regular rhythm and normal heart sounds.  Exam reveals no gallop and no friction rub.   No murmur heard. Pulmonary/Chest: Effort normal and breath sounds normal. No respiratory distress. He has no wheezes. He has no rales.  Musculoskeletal: He exhibits no  edema.  R arm ands leg s/p amputation, with prostehesis of R leg.  Lymphadenopathy:    He has no cervical adenopathy.  Neurological: He is alert and oriented to person, place, and time.  Vitals reviewed.      No results found for this or any previous visit (from the past 2160 hour(s)).   Assessment & Plan  Problem List Items Addressed This Visit      Cardiovascular and Mediastinum   Essential hypertension, malignant     Nervous and Auditory   Phantom limb pain (HCC)     Other   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- Primary   Relevant Medications   acetaminophen-codeine (TYLENOL #3) 300-30 MG tablet      Meds ordered this encounter  Medications  . acetaminophen-codeine (TYLENOL #3) 300-30 MG tablet    Sig: Take 1 tablet at bedtime as needed for pain    Dispense:  30 tablet    Refill:  3    Try to take every other night   1. 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- acetaminophen-codeine (TYLENOL #3) 300-30 MG tablet; Take 1 tablet at bedtime as needed for pain  Dispense: 30 tablet; Refill: 3  2. Essential hypertension, malignant  Cont Vasotec.  3. Phantom limb pain (HCC) Cont. Gabapentin

## 2016-05-10 ENCOUNTER — Ambulatory Visit (INDEPENDENT_AMBULATORY_CARE_PROVIDER_SITE_OTHER): Payer: Commercial Managed Care - HMO | Admitting: Family Medicine

## 2016-05-10 ENCOUNTER — Encounter: Payer: Self-pay | Admitting: Family Medicine

## 2016-05-10 VITALS — BP 140/90 | HR 76 | Temp 98.0°F | Ht 68.0 in | Wt 175.0 lb

## 2016-05-10 DIAGNOSIS — I10 Essential (primary) hypertension: Secondary | ICD-10-CM

## 2016-05-10 DIAGNOSIS — G546 Phantom limb syndrome with pain: Secondary | ICD-10-CM

## 2016-05-10 DIAGNOSIS — Z23 Encounter for immunization: Secondary | ICD-10-CM

## 2016-05-10 MED ORDER — ENALAPRIL MALEATE 10 MG PO TABS: 10.0000 mg | ORAL_TABLET | Freq: Every day | ORAL | 3 refills | Status: DC

## 2016-05-10 MED ORDER — GABAPENTIN 300 MG PO CAPS: ORAL_CAPSULE | ORAL | 3 refills | Status: DC

## 2016-05-10 NOTE — Progress Notes (Signed)
Name: Brandon Myers   MRN: [id number]    DOB: August 14, 1965   Date:05/10/2016       Progress Note  Subjective  Chief Complaint  Chief Complaint  Patient presents with  . Follow-up  . Leg Pain    HPI  Here for f/u of phantom pain in R stump.  Some trouble falling asleep secondary to stinging burning pain in R leg and "foot".  Keeps him from going to sleep.   No problem-specific Assessment & Plan notes found for this encounter.   Past Medical History:  Diagnosis Date  . Allergy   . GERD (gastroesophageal reflux disease)   . Hypertension   . Neutropenia, congenital [facility])     Past Surgical History:  Procedure Laterality Date  . AMPUTATION    . APPENDECTOMY      Family History  Problem Relation Age of Onset  . Heart Problems Mother   . Stroke Maternal Grandmother     Social History   Social History  . Marital status: Married    Spouse name: N/A  . Number of children: N/A  . Years of education: N/A   Occupational History  . Not on file.   Social History Main Topics  . Smoking status: Never Smoker  . Smokeless tobacco: Never Used  . Alcohol use No  . Drug use: No  . Sexual activity: Not on file   Other Topics Concern  . Not on file   Social History Narrative  . No narrative on file     Current Outpatient Prescriptions:  .  acetaminophen-codeine (TYLENOL #3) 300-30 MG tablet, Take 1 tablet at bedtime as needed for pain, Disp: 30 tablet, Rfl: 3 .  enalapril (VASOTEC) 10 MG tablet, Take 1 tablet (10 mg total) by mouth daily., Disp: 90 tablet, Rfl: 3 .  Filgrastim-sndz (ZARXIO) 480 MCG/0.8ML SOSY, Inject 0.8 mLs into the muscle every other day., Disp: , Rfl:  .  fluticasone (FLONASE) 50 MCG/ACT nasal spray, INSTILL 1 TO 2 SPRAYS IN EACH NOSTRIL EVERY NIGHT AT BEDTIME, Disp: 16 g, Rfl: 12 .  gabapentin (NEURONTIN) 800 MG tablet, Take 1 tablet (800 mg total) by mouth 3 (three) times daily., Disp: 270 tablet, Rfl: 3 .  meloxicam (MOBIC) 15 MG tablet, TAKE 1  TABLET(15 MG) BY MOUTH DAILY, Disp: 30 tablet, Rfl: 6 .  omeprazole (PRILOSEC) 40 MG capsule, Take 1 capsule (40 mg total) by mouth daily., Disp: 90 capsule, Rfl: 3 .  VENTOLIN HFA 108 (90 BASE) MCG/ACT inhaler, Inhale 2 puffs into the lungs as needed., Disp: , Rfl: 5 .  gabapentin (NEURONTIN) 300 MG capsule, Take 1 capsule once day in mid day, Disp: 90 capsule, Rfl: 3  Allergies  Allergen Reactions  . Prochlorperazine     Other reaction(s): Other (See Comments) As a child unable to recall  . Sulfa Antibiotics     Other reaction(s): Other (See Comments) As a child unable to recall  . Strawberry Extract Rash     Review of Systems  Constitutional: Negative for chills, diaphoresis, fever and weight loss.  HENT: Negative for hearing loss.   Eyes: Negative for blurred vision and double vision.  Respiratory: Negative for cough, shortness of breath and wheezing.   Cardiovascular: Negative for chest pain, palpitations and leg swelling.  Gastrointestinal: Negative for abdominal pain, constipation, heartburn and nausea.  Genitourinary: Negative for dysuria, frequency and urgency.  Musculoskeletal: Positive for myalgias. Negative for joint pain.  Skin: Negative for rash.  Neurological: Negative for dizziness,  tremors, weakness and headaches.       Phantom pain R leg/"foot"      Objective  Vitals:   05/10/16 1347 05/10/16 1421  BP: (!) 147/99 140/90  Pulse: 76   Temp: 98 F (36.7 C)   TempSrc: Oral   Weight: 175 lb (79.4 kg)   Height: 5\' 8"  (1.727 m)     Physical Exam  Constitutional: He is oriented to person, place, and time and well-developed, well-nourished, and in no distress. No distress.  HENT:  Head: Normocephalic and atraumatic.  Eyes: Conjunctivae and EOM are normal. Pupils are equal, round, and reactive to light. No scleral icterus.  Neck: Normal range of motion. Neck supple. Carotid bruit is not present. No thyromegaly present.  Cardiovascular: Normal rate, regular  rhythm and normal heart sounds.  Exam reveals no gallop and no friction rub.   No murmur heard. Pulmonary/Chest: Effort normal and breath sounds normal. No respiratory distress. He has no wheezes. He has no rales.  Musculoskeletal: He exhibits no edema.  R arm above elbow amputation .  R leg AK amputation with prosthesis.  Lymphadenopathy:    He has no cervical adenopathy.  Neurological: He is alert and oriented to person, place, and time.  Vitals reviewed.     No results found for this or any previous visit (from the past 2160 hour(s)).   Assessment & Plan  Problem List Items Addressed This Visit      Cardiovascular and Mediastinum   Essential hypertension, malignant   Relevant Medications   enalapril (VASOTEC) 10 MG tablet     Nervous and Auditory   Phantom limb pain (HCC) - Primary   Relevant Medications   gabapentin (NEURONTIN) 300 MG capsule    Other Visit Diagnoses    Immunization due       Relevant Orders   Flu Vaccine QUAD 36+ mos PF IM (Fluarix & Fluzone Quad PF)      Meds ordered this encounter  Medications  . enalapril (VASOTEC) 10 MG tablet    Sig: Take 1 tablet (10 mg total) by mouth daily.    Dispense:  90 tablet    Refill:  3  . gabapentin (NEURONTIN) 300 MG capsule    Sig: Take 1 capsule once day in mid day    Dispense:  90 capsule    Refill:  3   1. Essential hypertension, malignant  - enalapril (VASOTEC) 10 MG tablet; Take 1 tablet (10 mg total) by mouth daily.  Dispense: 90 tablet; Refill: 3  2. Phantom limb pain (HCC)  - gabapentin (NEURONTIN) 300 MG capsule; Take 1 capsule once day in mid day  Dispense: 90 capsule; Refill: 3 Add to 800 mg Gabapentin three times a day 3. Immunization due Flu shot

## 2016-06-07 ENCOUNTER — Ambulatory Visit (INDEPENDENT_AMBULATORY_CARE_PROVIDER_SITE_OTHER): Payer: Commercial Managed Care - HMO | Admitting: Family Medicine

## 2016-06-07 ENCOUNTER — Encounter: Payer: Self-pay | Admitting: Family Medicine

## 2016-06-07 VITALS — BP 135/88 | HR 93 | Temp 98.4°F | Resp 16 | Ht 68.0 in | Wt 177.0 lb

## 2016-06-07 DIAGNOSIS — G546 Phantom limb syndrome with pain: Secondary | ICD-10-CM

## 2016-06-07 DIAGNOSIS — I10 Essential (primary) hypertension: Secondary | ICD-10-CM | POA: Diagnosis not present

## 2016-06-07 DIAGNOSIS — J302 Other seasonal allergic rhinitis: Secondary | ICD-10-CM | POA: Diagnosis not present

## 2016-06-07 NOTE — Progress Notes (Signed)
Name: Brandon GrapesKevin L Covell   MRN: [id number]    DOB: 05/17/1966   Date:06/07/2016       Progress Note  Subjective  Chief Complaint  Chief Complaint  Patient presents with  . Hypertension    HPI Here for f/u of HBP.  Also has phantom pain that is pretty well controlled. Overall feeling pretty good. No problem-specific Assessment & Plan notes found for this encounter.   Past Medical History:  Diagnosis Date  . Allergy   . GERD (gastroesophageal reflux disease)   . Hypertension   . Neutropenia, congenital [facility](HCC)     Past Surgical History:  Procedure Laterality Date  . AMPUTATION    . APPENDECTOMY      Family History  Problem Relation Age of Onset  . Heart Problems Mother   . Stroke Maternal Grandmother     Social History   Social History  . Marital status: Married    Spouse name: N/A  . Number of children: N/A  . Years of education: N/A   Occupational History  . Not on file.   Social History Main Topics  . Smoking status: Never Smoker  . Smokeless tobacco: Never Used  . Alcohol use No  . Drug use: No  . Sexual activity: Not on file   Other Topics Concern  . Not on file   Social History Narrative  . No narrative on file     Current Outpatient Prescriptions:  .  acetaminophen-codeine (TYLENOL #3) 300-30 MG tablet, Take 1 tablet at bedtime as needed for pain, Disp: 30 tablet, Rfl: 3 .  enalapril (VASOTEC) 10 MG tablet, Take 1 tablet (10 mg total) by mouth daily., Disp: 90 tablet, Rfl: 3 .  Filgrastim-sndz (ZARXIO) 480 MCG/0.8ML SOSY, Inject 0.8 mLs into the muscle every other day., Disp: , Rfl:  .  fluticasone (FLONASE) 50 MCG/ACT nasal spray, INSTILL 1 TO 2 SPRAYS IN EACH NOSTRIL EVERY NIGHT AT BEDTIME, Disp: 16 g, Rfl: 12 .  gabapentin (NEURONTIN) 300 MG capsule, Take 1 capsule once day in mid day, Disp: 90 capsule, Rfl: 3 .  gabapentin (NEURONTIN) 800 MG tablet, Take 1 tablet (800 mg total) by mouth 3 (three) times daily., Disp: 270 tablet, Rfl: 3 .   meloxicam (MOBIC) 15 MG tablet, TAKE 1 TABLET(15 MG) BY MOUTH DAILY, Disp: 30 tablet, Rfl: 6 .  omeprazole (PRILOSEC) 40 MG capsule, Take 1 capsule (40 mg total) by mouth daily., Disp: 90 capsule, Rfl: 3 .  VENTOLIN HFA 108 (90 BASE) MCG/ACT inhaler, Inhale 2 puffs into the lungs as needed., Disp: , Rfl: 5  Allergies  Allergen Reactions  . Prochlorperazine     Other reaction(s): Other (See Comments) As a child unable to recall  . Sulfa Antibiotics     Other reaction(s): Other (See Comments) As a child unable to recall  . Strawberry Extract Rash     Review of Systems  Constitutional: Negative for chills, fever, malaise/fatigue and weight loss.  HENT: Negative for hearing loss.   Eyes: Negative for blurred vision and double vision.  Respiratory: Negative for cough, shortness of breath and wheezing.   Cardiovascular: Negative for chest pain, palpitations and leg swelling.  Gastrointestinal: Negative for abdominal pain, constipation and heartburn.  Genitourinary: Negative for dysuria, frequency and urgency.  Musculoskeletal: Negative for joint pain and myalgias.  Skin: Negative for rash.  Neurological: Positive for tingling (Phantom pain R arm and R leg). Negative for dizziness, tremors, weakness and headaches.      Objective  Vitals:   06/07/16 1344 06/07/16 1441  BP: (!) 143/98 135/88  Pulse: 93   Resp: 16   Temp: 98.4 F (36.9 C)   TempSrc: Oral   Weight: 177 lb (80.3 kg)   Height: 5\' 8"  (1.727 m)     Physical Exam  Constitutional: He is oriented to person, place, and time and well-developed, well-nourished, and in no distress. No distress.  HENT:  Head: Normocephalic and atraumatic.  Eyes: Conjunctivae and EOM are normal. Pupils are equal, round, and reactive to light. No scleral icterus.  Neck: Normal range of motion. Neck supple. Carotid bruit is not present. No thyromegaly present.  Cardiovascular: Normal rate, regular rhythm and normal heart sounds.  Exam  reveals no gallop and no friction rub.   No murmur heard. Pulmonary/Chest: Effort normal and breath sounds normal. No respiratory distress. He has no wheezes. He has no rales.  Musculoskeletal: He exhibits no edema.  R leg amputated above knee.  R arm amputated above elbow.  Lymphadenopathy:    He has no cervical adenopathy.  Neurological: He is alert and oriented to person, place, and time.  Vitals reviewed.      No results found for this or any previous visit (from the past 2160 hour(s)).   Assessment & Plan  Problem List Items Addressed This Visit      Cardiovascular and Mediastinum   Essential hypertension, malignant - Primary     Respiratory   Seasonal allergic rhinitis     Nervous and Auditory   Phantom limb pain (HCC)      No orders of the defined types were placed in this encounter.  1. Essential hypertension, malignant Cont Enalpril  2. Chronic seasonal allergic rhinitis due to other allergen  Cont meds 3. Phantom limb pain (HCC) cont meds.

## 2016-07-01 ENCOUNTER — Encounter: Payer: Self-pay | Admitting: Family Medicine

## 2016-07-01 ENCOUNTER — Ambulatory Visit (INDEPENDENT_AMBULATORY_CARE_PROVIDER_SITE_OTHER): Payer: Commercial Managed Care - HMO | Admitting: Family Medicine

## 2016-07-01 VITALS — BP 148/96 | HR 74 | Temp 98.4°F | Resp 16 | Ht 68.0 in | Wt 176.0 lb

## 2016-07-01 DIAGNOSIS — H43393 Other vitreous opacities, bilateral: Secondary | ICD-10-CM | POA: Insufficient documentation

## 2016-07-01 DIAGNOSIS — H538 Other visual disturbances: Secondary | ICD-10-CM | POA: Diagnosis not present

## 2016-07-01 NOTE — Progress Notes (Signed)
Subjective:    Patient ID: Brandon GrapesKevin L Hudnall, male    DOB: 11/18/1965, 50 y.o.   MRN: [id number]  Brandon Myers is a 50 y.o. male presenting on 07/01/2016 for Eye Pain (fuzzy looking Right side sister just  diagnosed with  cataract )  Patient presents for a same day appointment.  HPI   RIGHT EYE CLOUDY VISION: - Reports he has had some gradual worsening Right eye cloudy vision recently over past few weeks to months, unclear exact onset. Last saw Optometry in Spring 2017 for new glasses, has worn glasses for long time due to nearsighted vision. He describes current symptoms with "haziness" or "cloudiness" with some worsening night-time vision, with glare from lights, he is concerned about a cataract, as his sister was just recently diagnosed with a rapidly growing cataract, and he wanted to be evaluated. - Admits occasional floaters or "spidery spots" in his vision - Denies any flashes of light, eye pain, loss of vision, persistent blurry vision, trauma or eye injury   Social History  Substance Use Topics  . Smoking status: Never Smoker  . Smokeless tobacco: Never Used  . Alcohol use No    Review of Systems Per HPI unless specifically indicated above     Objective:    BP (!) 148/96   Pulse 74   Temp 98.4 F (36.9 C) (Oral)   Resp 16   Ht 5\' 8"  (1.727 m)   Wt 176 lb (79.8 kg)   BMI 26.76 kg/m   Wt Readings from Last 3 Encounters:  07/01/16 176 lb (79.8 kg)  06/07/16 177 lb (80.3 kg)  05/10/16 175 lb (79.4 kg)    Physical Exam  Constitutional: He appears well-developed and well-nourished. No distress.  Well-appearing, comfortable, cooperative  HENT:  Head: Normocephalic and atraumatic.  Mouth/Throat: Oropharynx is clear and moist.  Eyes: Conjunctivae and EOM are normal. Pupils are equal, round, and reactive to light. Right eye exhibits no discharge. Left eye exhibits no discharge.  No significant haziness or cloudy corneal appearance on direct ophthalmoscope exam today.    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  Skin: Skin is warm and dry. No rash noted. He is not diaphoretic. No erythema.  Psychiatric: His behavior is normal.  Nursing note and vitals reviewed.  No results found for this or any previous visit.     VISUAL ACUITY (07/01/16) in office Corrective Lens B 20/25 L 20/30 R 20/40  Assessment & Plan:   Problem List Items Addressed This Visit    Cloudy vision - Primary    Suspect possible early cataract formation, especially given similar description of sister with same condition. Does not seem to have high risk history for cataracts, no diabetes or other related eye problems. - Previously seen by Optometry  Plan: 1. Referral placed to see [facility] ophthalmology for further evaluation and management of R eye cloudy vision, patient has already scheduled this apt for this coming Monday 07/04/16, and needed referral today per insurance 2. Follow-up as needed      Relevant Orders   Ambulatory referral to Ophthalmology      No orders of the defined types were placed in this encounter.      Follow up plan: Return in about 1 month (around 08/01/2016), or if symptoms worsen or fail to improve, for vision changes.  Saralyn PilarAlexander Emelly Wurtz, DO [facility] 07/01/2016, 5:05 PM

## 2016-07-01 NOTE — Patient Instructions (Addendum)
Thank you for coming in to clinic today.  Referral ordered for [facility]. This should resolve the insurance problem. Let us know if there is anything else you need regarding this referral order  [facility]   [address], [place], [place] [address] Phone: [phone]  Vision today with glasses is good. Both eyes are 20/25, Left eye 20/30 and Right eye 20/40  Please schedule a follow-up appointment with Dr. Althea CharonKaramalegos and Dr Juanetta GoslingHawkins  1 month as needed for blurry vision  If you have any other questions or concerns, please feel free to call the clinic or send a message through MyChart. You may also schedule an earlier appointment if necessary.  Saralyn PilarAlexander Karamalegos, DO [facility]

## 2016-07-01 NOTE — Assessment & Plan Note (Signed)
Suspect possible early cataract formation, especially given similar description of sister with same condition. Does not seem to have high risk history for cataracts, no diabetes or other related eye problems. - Previously seen by Optometry  Plan: 1. Referral placed to see [facility] ophthalmology for further evaluation and management of R eye cloudy vision, patient has already scheduled this apt for this coming Monday 07/04/16, and needed referral today per insurance 2. Follow-up as needed

## 2016-07-04 ENCOUNTER — Telehealth: Payer: Self-pay | Admitting: Family Medicine

## 2016-07-04 DIAGNOSIS — H43393 Other vitreous opacities, bilateral: Secondary | ICD-10-CM | POA: Diagnosis not present

## 2016-07-04 NOTE — Telephone Encounter (Signed)
Pt's approval number is [id number] approved from 07/04/16 till 12/31/16 with 6 visit.

## 2016-07-04 NOTE — Telephone Encounter (Signed)
Pt had appt with Dr. Dellie Burnsingeldein at [facility] today.  They need a [employer] referral sent over before 5:00 today.

## 2016-07-11 ENCOUNTER — Other Ambulatory Visit: Payer: Self-pay | Admitting: Family Medicine

## 2016-07-19 ENCOUNTER — Other Ambulatory Visit: Payer: Self-pay | Admitting: Family Medicine

## 2016-07-19 DIAGNOSIS — S88919A Complete traumatic amputation of unspecified lower leg, level unspecified, initial encounter: Secondary | ICD-10-CM

## 2016-08-29 ENCOUNTER — Ambulatory Visit (INDEPENDENT_AMBULATORY_CARE_PROVIDER_SITE_OTHER): Payer: Commercial Managed Care - HMO | Admitting: Family Medicine

## 2016-08-29 ENCOUNTER — Encounter: Payer: Self-pay | Admitting: Family Medicine

## 2016-08-29 VITALS — BP 134/92 | HR 89 | Temp 98.9°F | Resp 16 | Ht 68.0 in | Wt 178.0 lb

## 2016-08-29 DIAGNOSIS — J069 Acute upper respiratory infection, unspecified: Secondary | ICD-10-CM | POA: Diagnosis not present

## 2016-08-29 DIAGNOSIS — B9789 Other viral agents as the cause of diseases classified elsewhere: Secondary | ICD-10-CM | POA: Diagnosis not present

## 2016-08-29 DIAGNOSIS — J208 Acute bronchitis due to other specified organisms: Secondary | ICD-10-CM | POA: Diagnosis not present

## 2016-08-29 MED ORDER — DM-GUAIFENESIN ER 30-600 MG PO TB12: 1.0000 | ORAL_TABLET | Freq: Two times a day (BID) | ORAL | 1 refills | Status: DC

## 2016-08-29 MED ORDER — LORATADINE 10 MG PO TABS: 10.0000 mg | ORAL_TABLET | Freq: Every day | ORAL | 11 refills | Status: DC

## 2016-08-29 MED ORDER — AZITHROMYCIN 250 MG PO TABS: ORAL_TABLET | ORAL | 0 refills | Status: DC

## 2016-08-29 NOTE — Progress Notes (Signed)
Name: Brandon Myers   MRN: [id number]    DOB: 11/16/1965   Date:08/29/2016       Progress Note  Subjective  Chief Complaint  Chief Complaint  Patient presents with  . URI  . Cough    HPI Here c/o congestion and cough for past 2-3 days.  Started as scratchy throat and some congestion, then cough worsening for past 2 days.  Cough not productive.  No fever or chills.  Mild HA today.  No aches.   Some fatigue.  He had his flu immunization this past fall.  No problem-specific Assessment & Plan notes found for this encounter.   Past Medical History:  Diagnosis Date  . Allergy   . GERD (gastroesophageal reflux disease)   . Hypertension   . Neutropenia, congenital [facility](HCC)     Social History  Substance Use Topics  . Smoking status: Never Smoker  . Smokeless tobacco: Never Used  . Alcohol use No     Current Outpatient Prescriptions:  .  acetaminophen-codeine (TYLENOL #3) 300-30 MG tablet, TAKE 1 TABLET BY MOUTH EVERY NIGHT AT BEDTIME AS NEEDED FOR PAIN(TRY TO TAKE EVERY OTHER NIGHT), Disp: 30 tablet, Rfl: 1 .  enalapril (VASOTEC) 10 MG tablet, Take 1 tablet (10 mg total) by mouth daily., Disp: 90 tablet, Rfl: 3 .  Filgrastim-sndz (ZARXIO) 480 MCG/0.8ML SOSY, Inject 0.8 mLs into the muscle every other day., Disp: , Rfl:  .  fluticasone (FLONASE) 50 MCG/ACT nasal spray, INSTILL 1 TO 2 SPRAYS IN EACH NOSTRIL EVERY NIGHT AT BEDTIME, Disp: 16 g, Rfl: 12 .  gabapentin (NEURONTIN) 300 MG capsule, Take 1 capsule once day in mid day, Disp: 90 capsule, Rfl: 3 .  gabapentin (NEURONTIN) 800 MG tablet, Take 1 tablet (800 mg total) by mouth 3 (three) times daily., Disp: 270 tablet, Rfl: 3 .  meloxicam (MOBIC) 15 MG tablet, TAKE 1 TABLET(15 MG) BY MOUTH DAILY, Disp: 30 tablet, Rfl: 6 .  omeprazole (PRILOSEC) 40 MG capsule, Take 1 capsule (40 mg total) by mouth daily., Disp: 90 capsule, Rfl: 3 .  VENTOLIN HFA 108 (90 Base) MCG/ACT inhaler, INHALE 2 PUFFS BY MOUTH FOUR TIMES DAILY AS NEEDED, Disp: 18 g,  Rfl: 12  Allergies  Allergen Reactions  . Prochlorperazine     Other reaction(s): Other (See Comments) As a child unable to recall  . Sulfa Antibiotics     Other reaction(s): Other (See Comments) As a child unable to recall  . Strawberry Extract Rash    Review of Systems  Constitutional: Positive for malaise/fatigue. Negative for chills, fever and weight loss.  HENT: Positive for congestion and sore throat. Negative for hearing loss, nosebleeds and tinnitus.   Eyes: Negative for blurred vision and double vision.  Respiratory: Positive for cough and shortness of breath (mild occ.). Negative for hemoptysis, sputum production and wheezing.   Cardiovascular: Negative for chest pain, palpitations and leg swelling.  Gastrointestinal: Positive for diarrhea. Negative for abdominal pain, blood in stool and heartburn.  Genitourinary: Negative for dysuria, frequency and urgency.  Musculoskeletal: Negative for joint pain and myalgias.  Skin: Negative for rash.  Neurological: Negative for dizziness, tingling, tremors, weakness and headaches.      Objective  Vitals:   08/29/16 1404  BP: (!) 134/92  Pulse: 89  Resp: 16  Temp: 98.9 F (37.2 C)  TempSrc: Oral  Weight: 178 lb (80.7 kg)  Height: 5\' 8"  (1.727 m)     Physical Exam  Constitutional: He is oriented to person, place,  and time. He appears distressed (appears to feel ill).  HENT:  Head: Normocephalic.  Right Ear: External ear normal.  Left Ear: External ear normal.  Nose: Mucosal edema and rhinorrhea present. Right sinus exhibits no maxillary sinus tenderness and no frontal sinus tenderness. Left sinus exhibits no maxillary sinus tenderness and no frontal sinus tenderness.  Mouth/Throat: Posterior oropharyngeal erythema (mild) present.  Cardiovascular: Normal rate, regular rhythm and normal heart sounds.   Pulmonary/Chest: Effort normal. No respiratory distress. He has no wheezes. He has no rales.  Mild coarse breath sounds  throughout.  Musculoskeletal: He exhibits no edema.  Neurological: He is alert and oriented to person, place, and time.  Skin: Skin is warm and dry.  Vitals reviewed.     No results found for this or any previous visit (from the past 2160 hour(s)).   Assessment & Plan  1. Viral upper respiratory tract infection  - loratadine (CLARITIN) 10 MG tablet; Take 1 tablet (10 mg total) by mouth daily.  Dispense: 30 tablet; Refill: 11  2. Acute viral bronchitis  - azithromycin (ZITHROMAX Z-PAK) 250 MG tablet; Take 2 tablets on day 1, then 1 tablet daily on days 2-5  Dispense: 6 each; Refill: 0 - dextromethorphan-guaiFENesin (MUCINEX DM) 30-600 MG 12hr tablet; Take 1 tablet by mouth 2 (two) times daily.  Dispense: 20 tablet; Refill: 1

## 2016-10-04 ENCOUNTER — Encounter: Payer: Self-pay | Admitting: Family Medicine

## 2016-10-04 ENCOUNTER — Ambulatory Visit (INDEPENDENT_AMBULATORY_CARE_PROVIDER_SITE_OTHER): Payer: Medicare HMO | Admitting: Family Medicine

## 2016-10-04 VITALS — BP 140/95 | HR 74 | Temp 98.2°F | Resp 16 | Ht 68.0 in | Wt 180.0 lb

## 2016-10-04 DIAGNOSIS — G8929 Other chronic pain: Secondary | ICD-10-CM | POA: Insufficient documentation

## 2016-10-04 DIAGNOSIS — K219 Gastro-esophageal reflux disease without esophagitis: Secondary | ICD-10-CM

## 2016-10-04 DIAGNOSIS — R05 Cough: Secondary | ICD-10-CM | POA: Diagnosis not present

## 2016-10-04 DIAGNOSIS — I10 Essential (primary) hypertension: Secondary | ICD-10-CM

## 2016-10-04 DIAGNOSIS — Z89201 Acquired absence of right upper limb, unspecified level: Secondary | ICD-10-CM | POA: Diagnosis not present

## 2016-10-04 DIAGNOSIS — M79604 Pain in right leg: Secondary | ICD-10-CM

## 2016-10-04 DIAGNOSIS — G546 Phantom limb syndrome with pain: Secondary | ICD-10-CM

## 2016-10-04 DIAGNOSIS — T464X5A Adverse effect of angiotensin-converting-enzyme inhibitors, initial encounter: Secondary | ICD-10-CM

## 2016-10-04 DIAGNOSIS — Z89619 Acquired absence of unspecified leg above knee: Secondary | ICD-10-CM | POA: Diagnosis not present

## 2016-10-04 DIAGNOSIS — S88919A Complete traumatic amputation of unspecified lower leg, level unspecified, initial encounter: Secondary | ICD-10-CM

## 2016-10-04 DIAGNOSIS — S48911A Complete traumatic amputation of right shoulder and upper arm, level unspecified, initial encounter: Secondary | ICD-10-CM

## 2016-10-04 MED ORDER — OMEPRAZOLE 40 MG PO CPDR: 40.0000 mg | DELAYED_RELEASE_CAPSULE | Freq: Every day | ORAL | 3 refills | Status: DC

## 2016-10-04 MED ORDER — TRAMADOL HCL 50 MG PO TABS: ORAL_TABLET | ORAL | 5 refills | Status: DC

## 2016-10-04 MED ORDER — LOSARTAN POTASSIUM 50 MG PO TABS: 50.0000 mg | ORAL_TABLET | Freq: Every day | ORAL | 3 refills | Status: DC

## 2016-10-04 MED ORDER — GABAPENTIN 800 MG PO TABS: 800.0000 mg | ORAL_TABLET | Freq: Three times a day (TID) | ORAL | 3 refills | Status: DC

## 2016-10-04 NOTE — Progress Notes (Signed)
Name: Brandon Myers   MRN: [id number]    DOB: 01/22/1966   Date:10/04/2016       Progress Note  Subjective  Chief Complaint  Chief Complaint  Patient presents with  . Hypertension    HPI  Here for f/u of HBP.  He has hx of phantom pain in R upper and lower extremity.   He still gets pain ini R leg stump after taking off prosthesis.  Also c/o cough with laughing and when lying down at night.  Cough is dry and tickling. No problem-specific Assessment & Plan notes found for this encounter.   Past Medical History:  Diagnosis Date  . Allergy   . GERD (gastroesophageal reflux disease)   . Hypertension   . Neutropenia, congenital [facility](HCC)     Past Surgical History:  Procedure Laterality Date  . AMPUTATION    . APPENDECTOMY      Family History  Problem Relation Age of Onset  . Heart Problems Mother   . Stroke Maternal Grandmother     Social History   Social History  . Marital status: Married    Spouse name: N/A  . Number of children: N/A  . Years of education: N/A   Occupational History  . Not on file.   Social History Main Topics  . Smoking status: Never Smoker  . Smokeless tobacco: Never Used  . Alcohol use No  . Drug use: No  . Sexual activity: Not on file   Other Topics Concern  . Not on file   Social History Narrative  . No narrative on file     Current Outpatient Prescriptions:  .  Filgrastim-sndz (ZARXIO) 480 MCG/0.8ML SOSY, Inject 0.8 mLs into the muscle every other day., Disp: , Rfl:  .  fluticasone (FLONASE) 50 MCG/ACT nasal spray, INSTILL 1 TO 2 SPRAYS IN EACH NOSTRIL EVERY NIGHT AT BEDTIME, Disp: 16 g, Rfl: 12 .  gabapentin (NEURONTIN) 300 MG capsule, Take 1 capsule once day in mid day, Disp: 90 capsule, Rfl: 3 .  gabapentin (NEURONTIN) 800 MG tablet, Take 1 tablet (800 mg total) by mouth 3 (three) times daily., Disp: 270 tablet, Rfl: 3 .  loratadine (CLARITIN) 10 MG tablet, Take 1 tablet (10 mg total) by mouth daily., Disp: 30 tablet, Rfl: 11 .   omeprazole (PRILOSEC) 40 MG capsule, Take 1 capsule (40 mg total) by mouth daily., Disp: 90 capsule, Rfl: 3 .  VENTOLIN HFA 108 (90 Base) MCG/ACT inhaler, INHALE 2 PUFFS BY MOUTH FOUR TIMES DAILY AS NEEDED, Disp: 18 g, Rfl: 12 .  losartan (COZAAR) 50 MG tablet, Take 1 tablet (50 mg total) by mouth daily., Disp: 90 tablet, Rfl: 3 .  meloxicam (MOBIC) 15 MG tablet, TAKE 1 TABLET(15 MG) BY MOUTH DAILY (Patient not taking: Reported on 10/04/2016), Disp: 30 tablet, Rfl: 6 .  traMADol (ULTRAM) 50 MG tablet, Take d1 in evening for leg pain, Disp: 30 tablet, Rfl: 5  Allergies  Allergen Reactions  . Prochlorperazine     Other reaction(s): Other (See Comments) As a child unable to recall  . Sulfa Antibiotics     Other reaction(s): Other (See Comments) As a child unable to recall  . Strawberry Extract Rash     Review of Systems  Constitutional: Negative for chills, fever, malaise/fatigue and weight loss.  HENT: Negative for hearing loss and tinnitus.   Eyes: Negative for blurred vision and double vision.  Respiratory: Positive for cough (dry). Negative for sputum production, shortness of breath and wheezing.  Cardiovascular: Negative for chest pain, palpitations and leg swelling.  Gastrointestinal: Negative for abdominal pain, blood in stool and heartburn.  Genitourinary: Negative for dysuria, frequency and urgency.  Musculoskeletal: Positive for myalgias (R stump pain). Negative for joint pain.  Skin: Negative for rash.  Neurological: Negative for dizziness, tingling, tremors, weakness and headaches.      Objective  Vitals:   10/04/16 1344 10/04/16 1426  BP: (!) 153/103 (!) 140/95  Pulse: 74   Resp: 16   Temp: 98.2 F (36.8 C)   TempSrc: Oral   Weight: 180 lb (81.6 kg)   Height: 5\' 8"  (1.727 m)     Physical Exam  Constitutional: He is oriented to person, place, and time and well-developed, well-nourished, and in no distress. No distress.  HENT:  Head: Normocephalic and  atraumatic.  Eyes: Conjunctivae and EOM are normal. Pupils are equal, round, and reactive to light. No scleral icterus.  Neck: Normal range of motion. Neck supple. Carotid bruit is not present. No thyromegaly present.  Cardiovascular: Normal rate, regular rhythm and normal heart sounds.  Exam reveals no gallop and no friction rub.   No murmur heard. Pulmonary/Chest: Effort normal and breath sounds normal. No respiratory distress. He has no wheezes. He has no rales.  Musculoskeletal: He exhibits no edema.  R arm amputated above elbow and R leg amputated above knee with leg prosthesis in place.  Lymphadenopathy:    He has no cervical adenopathy.  Neurological: He is alert and oriented to person, place, and time.  Vitals reviewed.      No results found for this or any previous visit (from the past 2160 hour(s)).   Assessment & Plan  Problem List Items Addressed This Visit      Cardiovascular and Mediastinum   Essential hypertension, malignant - Primary   Relevant Medications   losartan (COZAAR) 50 MG tablet     Digestive   Esophageal reflux   Relevant Medications   omeprazole (PRILOSEC) 40 MG capsule     Nervous and Auditory   Phantom limb pain (HCC)   Relevant Medications   gabapentin (NEURONTIN) 800 MG tablet     Other   Amputation of arm, right   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(HCC)   ACE-inhibitor cough   Chronic pain of right lower extremity   Relevant Medications   gabapentin (NEURONTIN) 800 MG tablet   traMADol (ULTRAM) 50 MG tablet      Meds ordered this encounter  Medications  . omeprazole (PRILOSEC) 40 MG capsule    Sig: Take 1 capsule (40 mg total) by mouth daily.    Dispense:  90 capsule    Refill:  3  . gabapentin (NEURONTIN) 800 MG tablet    Sig: Take 1 tablet (800 mg total) by mouth 3 (three) times daily.    Dispense:  270 tablet    Refill:  3  . losartan (COZAAR) 50 MG tablet    Sig: Take 1 tablet (50 mg total) by mouth daily.    Dispense:  90 tablet     Refill:  3  . traMADol (ULTRAM) 50 MG tablet    Sig: Take d1 in evening for leg pain    Dispense:  30 tablet    Refill:  5   1. Gastroesophageal reflux disease, esophagitis presence not specified  - omeprazole (PRILOSEC) 40 MG capsule; Take 1 capsule (40 mg total) by mouth daily.  Dispense: 90 capsule; Refill: 3  2. Phantom limb pain (HCC)  - gabapentin (NEURONTIN) 800  MG tablet; Take 1 tablet (800 mg total) by mouth 3 (three) times daily.  Dispense: 270 tablet; Refill: 3 cont Gabapentin 300 mg also 3. Essential hypertension, malignant  - losartan (COZAAR) 50 MG tablet; Take 1 tablet (50 mg total) by mouth daily.  Dispense: 90 tablet; Refill: 3  4. ACE-inhibitor cough   5. Amputation of arm, right   6. 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(HCC)   7. Chronic pain of right lower extremity  - traMADol (ULTRAM) 50 MG tablet; Take d1 in evening for leg pain  Dispense: 30 tablet; Refill: 5

## 2016-11-07 ENCOUNTER — Ambulatory Visit (INDEPENDENT_AMBULATORY_CARE_PROVIDER_SITE_OTHER): Payer: Medicare HMO | Admitting: Nurse Practitioner

## 2016-11-07 VITALS — BP 122/80 | HR 102 | Temp 98.2°F | Resp 16 | Ht 68.0 in | Wt 184.0 lb

## 2016-11-07 DIAGNOSIS — J0110 Acute frontal sinusitis, unspecified: Secondary | ICD-10-CM | POA: Diagnosis not present

## 2016-11-07 DIAGNOSIS — J301 Allergic rhinitis due to pollen: Secondary | ICD-10-CM | POA: Diagnosis not present

## 2016-11-07 MED ORDER — IPRATROPIUM BROMIDE 0.06 % NA SOLN: 2.0000 | Freq: Four times a day (QID) | NASAL | 0 refills | Status: DC

## 2016-11-07 MED ORDER — AMOXICILLIN 500 MG PO TABS: 500.0000 mg | ORAL_TABLET | Freq: Two times a day (BID) | ORAL | 0 refills | Status: AC

## 2016-11-07 NOTE — Patient Instructions (Signed)
Brandon Myers, Thank you for coming in to clinic today.  1. It sounds like you have an Upper Respiratory Bacterial infection.  Recommend good hand washing. - START taking amoxicillin 500 mg tablets every 12 hours for 10 days.  Make sure to take all doses of your antibiotic. - While you are on an antibiotic, take a probiotic.  Antibiotics kill good and bad bacteria.  A probiotic helps to replace your good bacteria. Probiotic pills can be found over the counter.  One brand is Florastor, but you can use any brand you prefer.  You can also get good bacteria from foods.  These foods are yogurt. - Drink plenty of fluids.  - Start Atrovent nasal spray decongestant 2 sprays each nostril up to 4 times daily for 5-7 days - Continue anti-histamine loratadine  daily. - You may also use Flonase 2 sprays each nostril daily for up to 4-6 weeks   Other over the counter medications you may try, if needed for symptoms are: - If congestion is worse, start OTC Mucinex (or may try Mucinex-DM for cough) up to 7-10 days then stop - You may try over the counter Nasal Saline spray (Simply Saline, Ocean Spray) as needed to reduce congestion. - Start taking Tylenol extra strength 1 to 2 tablets every 6-8 hours for aches or fever/chills for next few days as needed.  Do not take more than 3,000 mg in 24 hours from all medicines.  You may also take ibuprofen 200-400mg  every 8 hours as needed.   - Drink warm herbal tea with honey for sore throat.   If symptoms are significantly worse with persistent fevers/chills despite tylenol/ibpurofen, nausea, vomiting unable to tolerate food/fluids or medicine, body aches, or shortness of breath, sinus pain pressure or worsening productive cough, then follow-up for re-evaluation, may seek more immediate care at Urgent Care or the ED if you are more concerned that it is an emergency.    Please schedule a follow-up appointment with Wilhelmina Mcardle, AGNP in 7-10 days as needed if your symptoms  worsen or don't get better.  If you have any other questions or concerns, please feel free to call the clinic or send a message through MyChart. You may also schedule an earlier appointment if necessary.  Wilhelmina Mcardle, DNP, AGNP-BC Adult Gerontology Nurse Practitioner [facility]     Sinusitis, Adult Sinusitis is soreness and inflammation of your sinuses. Sinuses are hollow spaces in the bones around your face. Your sinuses are located:  Around your eyes.  In the middle of your forehead.  Behind your nose.  In your cheekbones. Your sinuses and nasal passages are lined with a stringy fluid (mucus). Mucus normally drains out of your sinuses. When your nasal tissues become inflamed or swollen, the mucus can become trapped or blocked so air cannot flow through your sinuses. This allows bacteria, viruses, and funguses to grow, which leads to infection. Sinusitis can develop quickly and last for 7?10 days (acute) or for more than 12 weeks (chronic). Sinusitis often develops after a cold. What are the causes? This condition is caused by anything that creates swelling in the sinuses or stops mucus from draining, including:  Allergies.  Asthma.  Bacterial or viral infection.  Abnormally shaped bones between the nasal passages.  Nasal growths that contain mucus (nasal polyps).  Narrow sinus openings.  Pollutants, such as chemicals or irritants in the air.  A foreign object stuck in the nose.  A fungal infection. This is rare. What  increases the risk? The following factors may make you more likely to develop this condition:  Having allergies or asthma.  Having had a recent cold or respiratory tract infection.  Having structural deformities or blockages in your nose or sinuses.  Having a weak immune system.  Doing a lot of swimming or diving.  Overusing nasal sprays.  Smoking. What are the signs or symptoms? The main symptoms of this condition  are pain and a feeling of pressure around the affected sinuses. Other symptoms include:  Upper toothache.  Earache.  Headache.  Bad breath.  Decreased sense of smell and taste.  A cough that may get worse at night.  Fatigue.  Fever.  Thick drainage from your nose. The drainage is often green and it may contain pus (purulent).  Stuffy nose or congestion.  Postnasal drip. This is when extra mucus collects in the throat or back of the nose.  Swelling and warmth over the affected sinuses.  Sore throat.  Sensitivity to light. How is this diagnosed? This condition is diagnosed based on symptoms, a medical history, and a physical exam. To find out if your condition is acute or chronic, your health care provider may:  Look in your nose for signs of nasal polyps.  Tap over the affected sinus to check for signs of infection.  View the inside of your sinuses using an imaging device that has a light attached (endoscope). If your health care provider suspects that you have chronic sinusitis, you may also:  Be tested for allergies.  Have a sample of mucus taken from your nose (nasal culture) and checked for bacteria.  Have a mucus sample examined to see if your sinusitis is related to an allergy. If your sinusitis does not respond to treatment and it lasts longer than 8 weeks, you may have an MRI or CT scan to check your sinuses. These scans also help to determine how severe your infection is. In rare cases, a bone biopsy may be done to rule out more serious types of fungal sinus disease. How is this treated? Treatment for sinusitis depends on the cause and whether your condition is chronic or acute. If a virus is causing your sinusitis, your symptoms will go away on their own within 10 days. You may be given medicines to relieve your symptoms, including:  Topical nasal decongestants. They shrink swollen nasal passages and let mucus drain from your sinuses.  Antihistamines. These  drugs block inflammation that is triggered by allergies. This can help to ease swelling in your nose and sinuses.  Topical nasal corticosteroids. These are nasal sprays that ease inflammation and swelling in your nose and sinuses.  Nasal saline washes. These rinses can help to get rid of thick mucus in your nose. If your condition is caused by bacteria, you will be given an antibiotic medicine. If your condition is caused by a fungus, you will be given an antifungal medicine. Surgery may be needed to correct underlying conditions, such as narrow nasal passages. Surgery may also be needed to remove polyps. Follow these instructions at home: Medicines   Take, use, or apply over-the-counter and prescription medicines only as told by your health care provider. These may include nasal sprays.  If you were prescribed an antibiotic medicine, take it as told by your health care provider. Do not stop taking the antibiotic even if you start to feel better. Hydrate and Humidify   Drink enough water to keep your urine clear or pale yellow. Staying hydrated  will help to thin your mucus.  Use a cool mist humidifier to keep the humidity level in your home above 50%.  Inhale steam for 10-15 minutes, 3-4 times a day or as told by your health care provider. You can do this in the bathroom while a hot shower is running.  Limit your exposure to cool or dry air. Rest   Rest as much as possible.  Sleep with your head raised (elevated).  Make sure to get enough sleep each night. General instructions   Apply a warm, moist washcloth to your face 3-4 times a day or as told by your health care provider. This will help with discomfort.  Wash your hands often with soap and water to reduce your exposure to viruses and other germs. If soap and water are not available, use hand sanitizer.  Do not smoke. Avoid being around people who are smoking (secondhand smoke).  Keep all follow-up visits as told by your  health care provider. This is important. Contact a health care provider if:  You have a fever.  Your symptoms get worse.  Your symptoms do not improve within 10 days. Get help right away if:  You have a severe headache.  You have persistent vomiting.  You have pain or swelling around your face or eyes.  You have vision problems.  You develop confusion.  Your neck is stiff.  You have trouble breathing. This information is not intended to replace advice given to you by your health care provider. Make sure you discuss any questions you have with your health care provider. Document Released: 07/18/2005 Document Revised: 03/13/2016 Document Reviewed: 05/13/2015 Elsevier Interactive Patient Education  2017 [employer].

## 2016-11-07 NOTE — Progress Notes (Signed)
Subjective:    Patient ID: Brandon Myers, male    DOB: 10/29/1965, 51 y.o.   MRN: [id number]  Brandon Myers is a 51 y.o. male presenting on 11/07/2016 for URI   HPI  Upper Respiratory Infection Symptoms started several weeks ago with pollen. Initially thought it was allergies, now he feels it is the "common cold"  X 1 week.  Symptoms include nasal congestion, non-productive cough, scratchy throat, sore throat, ear fullness, some intermittent headaches.   Denies ear pain and difficulty breathing.  Sick contacts: His son had cold and is getting better.  Taking mucinex DM, claritin and flonase that have helped sx some. But he stopped flonase r/t nose bleeds and noticed some brown mucous this am.   Social History  Substance Use Topics  . Smoking status: Never Smoker  . Smokeless tobacco: Never Used  . Alcohol use No    Review of Systems Per HPI unless specifically indicated above     Objective:    BP 122/80   Pulse (!) 102   Temp 98.2 F (36.8 C) (Oral)   Resp 16   Ht  (1.727 m)   Wt 184 lb (83.5 kg)   BMI 27.98 kg/m   Wt Readings from Last 3 Encounters:  11/07/16 184 lb (83.5 kg)  10/04/16 180 lb (81.6 kg)  08/29/16 178 lb (80.7 kg)    Physical Exam  Constitutional: He is oriented to person, place, and time. He appears well-developed and well-nourished.  Mild distress, appears unwell  HENT:  Head: Normocephalic and atraumatic.  Right Ear: Tympanic membrane, external ear and ear canal normal.  Left Ear: Tympanic membrane, external ear and ear canal normal.  Nose: Mucosal edema and rhinorrhea present. Right sinus exhibits frontal sinus tenderness. Right sinus exhibits no maxillary sinus tenderness. Left sinus exhibits frontal sinus tenderness. Left sinus exhibits no maxillary sinus tenderness.  Mouth/Throat: Uvula is midline and mucous membranes are normal. Posterior oropharyngeal edema present.  Cobblestoning, postnasal drip  Neck: Normal range of motion.  Neck supple.  Cardiovascular: Normal rate, regular rhythm, normal heart sounds and intact distal pulses.   Pulmonary/Chest: Effort normal and breath sounds normal.  Lymphadenopathy:    He has no cervical adenopathy.  Neurological: He is alert and oriented to person, place, and time.  Nystagmus, resting tremor  Skin: Skin is warm and dry.  Psychiatric: He has a normal mood and affect. His behavior is normal.   No results found for this or any previous visit.    Assessment & Plan:   Problem List Items Addressed This Visit      Respiratory   Seasonal allergic rhinitis    Other Visit Diagnoses    Acute non-recurrent frontal sinusitis    -  Primary Consistent with URI and secondary sinusitis with symptoms worsening over the past 7 days and initial symptoms of nasal congestion and sinus pressure over 2 weeks ago.   Plan: 1.START taking amoxicillin 500 mg tablets every 12 hours for 10 days.  Discussed completing antibiotic. - While on antibiotic, take a probiotic OTC or from food. - Start Atrovent nasal spray decongestant 2 sprays each nostril up to 4 times daily for 5-7 days - Continue anti-histamine loratadine  daily. - Can use Flonase 2 sprays each nostril daily for up to 4-6 weeks if no epistaxis. - Start Mucinex-DM OTC for  7-10 days prn congestion 2. Supportive care with nasal saline, warm herbal tea with honey, 3. Improve hydration 4. Tylenol / Motrin  PRN fevers  5. Return criteria given   Relevant Medications   amoxicillin (AMOXIL) 500 MG tablet   ipratropium (ATROVENT) 0.06 % nasal spray      Meds ordered this encounter  Medications  . amoxicillin (AMOXIL) 500 MG tablet    Sig: Take 1 tablet (500 mg total) by mouth 2 (two) times daily.    Dispense:  20 tablet    Refill:  0    Order Specific Question:   Supervising Provider    Answer:   Smitty Cords [[id number]]  . ipratropium (ATROVENT) 0.06 % nasal spray    Sig: Place 2 sprays into both nostrils 4 (four)  times daily.    Dispense:  15 mL    Refill:  0    Order Specific Question:   Supervising Provider    Answer:   Smitty Cords [[id number]]      Follow up plan: Return 7-10 days if symptoms worsen or fail to improve.   Wilhelmina Mcardle, DNP, AGPCNP-BC Adult Gerontology Primary Care Nurse Practitioner [facility] 11/08/2016, 5:00 PM

## 2016-11-08 ENCOUNTER — Encounter: Payer: Self-pay | Admitting: Nurse Practitioner

## 2016-11-08 NOTE — Progress Notes (Signed)
I have reviewed this encounter including the documentation in this note and/or discussed this patient with the provider, Wilhelmina Mcardle, AGPCNP-BC. I am certifying that I agree with the content of this note as supervising physician.  Saralyn Pilar, DO [facility] 11/08/2016, 7:16 PM

## 2016-11-14 DIAGNOSIS — Z89201 Acquired absence of right upper limb, unspecified level: Secondary | ICD-10-CM | POA: Diagnosis not present

## 2016-11-14 DIAGNOSIS — Z89611 Acquired absence of right leg above knee: Secondary | ICD-10-CM | POA: Diagnosis not present

## 2016-11-14 DIAGNOSIS — D700 Congenital agranulocytosis: Secondary | ICD-10-CM | POA: Diagnosis not present

## 2016-11-14 DIAGNOSIS — I10 Essential (primary) hypertension: Secondary | ICD-10-CM | POA: Diagnosis not present

## 2016-12-06 ENCOUNTER — Ambulatory Visit (INDEPENDENT_AMBULATORY_CARE_PROVIDER_SITE_OTHER): Payer: Medicare Other | Admitting: Family Medicine

## 2016-12-06 ENCOUNTER — Other Ambulatory Visit: Payer: Self-pay | Admitting: Family Medicine

## 2016-12-06 ENCOUNTER — Encounter: Payer: Self-pay | Admitting: Family Medicine

## 2016-12-06 VITALS — BP 152/98 | HR 79 | Temp 98.3°F | Resp 16 | Ht 68.0 in | Wt 182.0 lb

## 2016-12-06 DIAGNOSIS — E785 Hyperlipidemia, unspecified: Secondary | ICD-10-CM | POA: Insufficient documentation

## 2016-12-06 DIAGNOSIS — I10 Essential (primary) hypertension: Secondary | ICD-10-CM | POA: Diagnosis not present

## 2016-12-06 DIAGNOSIS — H43393 Other vitreous opacities, bilateral: Secondary | ICD-10-CM | POA: Diagnosis not present

## 2016-12-06 DIAGNOSIS — R519 Headache, unspecified: Secondary | ICD-10-CM

## 2016-12-06 DIAGNOSIS — R51 Headache: Secondary | ICD-10-CM

## 2016-12-06 DIAGNOSIS — E782 Mixed hyperlipidemia: Secondary | ICD-10-CM

## 2016-12-06 DIAGNOSIS — D700 Congenital agranulocytosis: Secondary | ICD-10-CM

## 2016-12-06 DIAGNOSIS — R7309 Other abnormal glucose: Secondary | ICD-10-CM

## 2016-12-06 DIAGNOSIS — Z0000 Encounter for general adult medical examination without abnormal findings: Secondary | ICD-10-CM

## 2016-12-06 MED ORDER — AMLODIPINE BESYLATE 10 MG PO TABS: ORAL_TABLET | ORAL | 5 refills | Status: DC

## 2016-12-06 NOTE — Progress Notes (Signed)
Subjective:    Patient ID: Brandon GrapesKevin L Myers, male    DOB: 11/05/1965, 51 y.o.   MRN: [id number]  Brandon Myers is a 51 y.o. male presenting on 12/06/2016 for Hypertension (pt is concerned about HA onset week as per B/P meds were changed in 03/18 don't know if HA caused by that)   HPI   CHRONIC HTN: Reports concern today with elevated BPs and headaches, see below. Last visit with PCP 10/04/16 he was discontinued off of ACEi (Enalapril) at that time due to persistent cough, he was started on ARB, but does not check BP outside office. Has had some still elevated readings at other doctors office up to 140/90s. Current Meds - Losartan 50mg  Reports good compliance, took meds today. Tolerating well, w/o complaints. Lifestyle: - No recent diet changes, only minimal salt added. Does not always eat out at restaurant, does not check food label - Drinks 3-4 cups of coffee daily, 8oz mug - No regular exercise, limited by s/p RLE amputation - No significant recent stressors, his mother is in a nursing home with dementia Denies CP, dyspnea, edema, dizziness / lightheadedness  Headaches Subacute: - Reports frontal headaches bilateral daily for past 1-2 weeks, described as constant sharp pain, up to 4-5/10, no clear known trigger for headache or worsening factors, improved by Ibuprofen 200mg  x 2 pills usually only one dose daily with good results. Tried Tylenol initially without relief. Not taking Excedrin. - Admits associated photosensitivity - He was seen by [facility] in 07/2016, was told he "floaters" and abnormal movements in eyes with chronic nystagmus, but no identified cataract or other problem   Social History  Substance Use Topics  . Smoking status: Never Smoker  . Smokeless tobacco: Never Used  . Alcohol use No    Review of Systems Per HPI unless specifically indicated above     Objective:    BP (!) 152/98 (BP Location: Left Arm, Cuff Size: Normal)   Pulse 79   Temp 98.3 F (36.8 C)  (Oral)   Resp 16   Ht 5\' 8"  (1.727 m)   Wt 182 lb (82.6 kg)   BMI 27.67 kg/m   Wt Readings from Last 3 Encounters:  12/06/16 182 lb (82.6 kg)  11/07/16 184 lb (83.5 kg)  10/04/16 180 lb (81.6 kg)    Physical Exam  Constitutional: He is oriented to person, place, and time. He appears well-developed and well-nourished. No distress.  Chronically ill but currently well-appearing, mild discomfort with frontal headache and photosensitivity, cooperative  HENT:  Head: Normocephalic and atraumatic.  Mouth/Throat: Oropharynx is clear and moist.  Eyes: Conjunctivae are normal. Right eye exhibits no discharge. Left eye exhibits no discharge.  Stable chronic abnormal nystagmus  Neck: Normal range of motion. Neck supple.  Cardiovascular: Normal rate, regular rhythm, normal heart sounds and intact distal pulses.   No murmur heard. Pulmonary/Chest: Effort normal and breath sounds normal. No respiratory distress. He has no wheezes. He has no rales.  Musculoskeletal: He exhibits no edema (Left lower ext).  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 No cranial nerve deficit.  Distal sensation to light touch intact  Skin: Skin is warm and dry. No rash noted. He is not diaphoretic. No erythema.  Psychiatric: He has a normal mood and affect. His behavior is normal.  Well groomed, good eye contact, normal speech and  thoughts. Does appear mildly anxious today  Nursing note and vitals reviewed.  No results found for this or any previous visit.    Assessment & Plan:   Problem List Items Addressed This Visit    Visual floaters, bilateral    Stable floaters with intermittent visual disturbance and blurry vision after evaluation by [facility] 07/2016. - Also with photosensitivity now with bilateral frontal headache and HTN uncontrolled - Check labs this week - Trial on  new Amlodipine CCB - Follow-up with [facility] if any significant worsening with vision      Essential hypertension - Primary    Significantly elevated BP >160/100, only mildly improved on manual re-check. No regular home readings to compare. - Symptomatic with headache, frontal possibly secondary to HTN, without history of migraine - No known complication, but no lab results available for chemistry (last is 2013), have other outside labs CBC  Plan: 1. Start new blood pressure pill in addition to Losartan. Take Amlodipine 10mg  HALF tablet (dose of 5mg ) for 1-2 weeks, check BP at home more regularly for now, write it down to keep record of it handout BP log given to patient. If BP is consistently >140/90 then increase to one WHOLE 10mg  tablet daily 2. WIll need to improve regular exercise, and monitor food labels for sodium 3. Check labs, chemistry, among others, to get within this week to ensure no complications secondary to HTN, also for baseline labs before physical 4. Follow-up within 4 weeks for Annual Physical      Relevant Medications   amLODipine (NORVASC) 10 MG tablet    Other Visit Diagnoses    Frontal headache      Suspect related to uncontrolled HTN, clinically less likely to be migraine headache based on history, but possible with photosensitivity and seems to be more persistent headache for few days without resolution - limited conservative therapy so far - Control HTN, add CCB Amlodipine, titrate up if needed - May increase Ibuprofen for now, check chemistry for Cr - May try Excedrin Migraine PRN - Follow-up 4 weeks Annual Physical - Return criteria given if worsening headache    Relevant Medications   amLODipine (NORVASC) 10 MG tablet      Meds ordered this encounter  Medications  .       Marland Kitchen amLODipine (NORVASC) 10 MG tablet    Sig: Start with half tablet (dose 5mg ) daily for 1-2 weeks, if BP >140/90 increase to whole pill 10mg  daily    Dispense:  30 tablet     Refill:  5    Follow up plan: Return in about 4 weeks (around 01/03/2017) for Annual Physical, BP/HA follow-up.  Saralyn Pilar, DO [facility] 12/07/2016, 6:06 AM

## 2016-12-06 NOTE — Patient Instructions (Addendum)
Thank you for coming to the clinic today.  1. Elevated blood pressure - Start new blood pressure pill in addition to Losartan. Take Amlodipine HALF tablet (dose of 5mg ) for 1-2 weeks, if you can check BP at home, write it down to keep record of it, bring to next visit. If BP is consistently >140/90 then increase to one WHOLE tablet daily until I see you back  Try to limit sodium or salt in diet, check food labels Try to increase regular exercise walking  2. For headache - Likely due to blood pressure, so should improve with new medicine - You may continue to take Ibuprofen 200mg  - take 2-3 pills per dose up to 3 times daily as needed for headache - May also consider Excedrin Migraine, can try this every few days as needed  If vision is not improving, need to call back [facility] and see if they can re-evaluate your vision  You will be due for FASTING BLOOD WORK (no food or drink after midnight before, only water or coffee without cream/sugar on the morning of)  - Please go ahead and schedule a "Lab Only" visit in the morning at the clinic for lab draw within 1 week (prefer later this week if possible) before next Annual Physical - Make sure Lab Only appointment is at least 1-2 weeks before your next appointment, so that results will be available  For Lab Results, once available within 2-3 days of blood draw, you can can log in to MyChart online to view your results and a brief explanation. Also, we can discuss results at next follow-up visit.  Please schedule a follow-up appointment with Dr. Althea CharonKaramalegos in 3-6 weeks for Annual Physical, BP/HA follow-up  If you have any other questions or concerns, please feel free to call the clinic or send a message through MyChart. You may also schedule an earlier appointment if necessary.  Brandon PilarAlexander Paytyn Mesta, DO [facility]

## 2016-12-07 NOTE — Assessment & Plan Note (Signed)
Stable floaters with intermittent visual disturbance and blurry vision after evaluation [facility] 07/2016. - Also with photosensitivity now with bilateral frontal headache and HTN uncontrolled - Check labs this week - Trial on new Amlodipine CCB - Follow-up with [facility] if any significant worsening with vision

## 2016-12-07 NOTE — Assessment & Plan Note (Signed)
Significantly elevated BP >160/100, only mildly improved on manual re-check. No regular home readings to compare. - Symptomatic with headache, frontal possibly secondary to HTN, without history of migraine - No known complication, but no lab results available for chemistry (last is 2013), have other outside labs CBC  Plan: 1. Start new blood pressure pill in addition to Losartan. Take Amlodipine 10mg  HALF tablet (dose of 5mg ) for 1-2 weeks, check BP at home more regularly for now, write it down to keep record of it handout BP log given to patient. If BP is consistently >140/90 then increase to one WHOLE 10mg  tablet daily 2. WIll need to improve regular exercise, and monitor food labels for sodium 3. Check labs, chemistry, among others, to get within this week to ensure no complications secondary to HTN, also for baseline labs before physical 4. Follow-up within 4 weeks for Annual Physical

## 2016-12-09 ENCOUNTER — Other Ambulatory Visit: Payer: Medicare Other

## 2016-12-10 LAB — COMPLETE METABOLIC PANEL WITH GFR
ALBUMIN: 4.2 g/dL (ref 3.6–5.1)
ALK PHOS: 117 U/L — AB (ref 40–115)
ALT: 61 U/L — ABNORMAL HIGH (ref 9–46)
AST: 40 U/L (ref 10–40)
BUN: 11 mg/dL (ref 7–25)
CO2: 24 mmol/L (ref 20–31)
Calcium: 9.4 mg/dL (ref 8.6–10.3)
Chloride: 104 mmol/L (ref 98–110)
Creat: 0.79 mg/dL (ref 0.60–1.35)
GFR, Est African American: >89 mL/min (ref >=60)
GFR, Est Non African American: >89 mL/min (ref >=60)
GLUCOSE: 93 mg/dL (ref 65–99)
POTASSIUM: 3.9 mmol/L (ref 3.5–5.3)
SODIUM: 139 mmol/L (ref 135–146)
Total Bilirubin: 0.3 mg/dL (ref 0.2–1.2)
Total Protein: 6.6 g/dL (ref 6.1–8.1)

## 2016-12-10 LAB — LIPID PANEL
Cholesterol: 128 mg/dL (ref <200)
HDL: 20 mg/dL — ABNORMAL LOW (ref >40)
LDL CALC: 79 mg/dL (ref <100)
Total CHOL/HDL Ratio: 6.4 Ratio — ABNORMAL HIGH (ref <5.0)
Triglycerides: 146 mg/dL (ref <150)
VLDL: 29 mg/dL (ref <30)

## 2016-12-10 LAB — HEMOGLOBIN A1C
Hgb A1c MFr Bld: 5.1 % (ref <5.7)
MEAN PLASMA GLUCOSE: 100 mg/dL

## 2016-12-27 ENCOUNTER — Telehealth: Payer: Self-pay | Admitting: Family Medicine

## 2016-12-27 DIAGNOSIS — I10 Essential (primary) hypertension: Secondary | ICD-10-CM

## 2016-12-27 MED ORDER — LOSARTAN POTASSIUM 100 MG PO TABS: 100.0000 mg | ORAL_TABLET | Freq: Every day | ORAL | 3 refills | Status: DC

## 2016-12-27 NOTE — Telephone Encounter (Signed)
Pt  Wanted to know if he can go back on losartan (maybe up dosage) Pt. Wife states that medication that was given to him amlodipine was causing his finger to swell

## 2016-12-27 NOTE — Telephone Encounter (Signed)
Pt advised.

## 2016-12-27 NOTE — Telephone Encounter (Signed)
Last visit 12/06/16, discussed HTN, had some uncontrolled readings only on Losartan 50mg  daily. He was started on Amlodipine 10mg  daily. Now complains of side effect with swelling in finger.  Please notify patient that he was initially instructed to take BOTH the new Amlodipine along with the Losartan, so he should not have stopped the Losartan. If he cannot tolerate Amlodipine due to swelling, then we can stop taking this one and try increasing dose of Losartan, will send in 100mg  tablets to pharmacy, take one Losartan 100mg  daily, monitor BP let us know if elevated >140/90 persistently.  Saralyn PilarAlexander Karamalegos, DO [facility] 12/27/2016, 12:41 PM

## 2016-12-30 ENCOUNTER — Other Ambulatory Visit: Payer: Self-pay | Admitting: Nurse Practitioner

## 2016-12-30 ENCOUNTER — Ambulatory Visit (INDEPENDENT_AMBULATORY_CARE_PROVIDER_SITE_OTHER): Payer: Medicare Other | Admitting: Nurse Practitioner

## 2016-12-30 ENCOUNTER — Encounter: Payer: Self-pay | Admitting: Nurse Practitioner

## 2016-12-30 VITALS — BP 164/105 | HR 79 | Temp 98.4°F | Ht 68.0 in | Wt 183.6 lb

## 2016-12-30 DIAGNOSIS — J301 Allergic rhinitis due to pollen: Secondary | ICD-10-CM | POA: Diagnosis not present

## 2016-12-30 DIAGNOSIS — H6981 Other specified disorders of Eustachian tube, right ear: Secondary | ICD-10-CM

## 2016-12-30 DIAGNOSIS — J0110 Acute frontal sinusitis, unspecified: Secondary | ICD-10-CM

## 2016-12-30 MED ORDER — IPRATROPIUM BROMIDE 0.06 % NA SOLN: 2.0000 | Freq: Four times a day (QID) | NASAL | 0 refills | Status: DC

## 2016-12-30 MED ORDER — FLUTICASONE PROPIONATE 50 MCG/ACT NA SUSP: NASAL | 12 refills | Status: DC

## 2016-12-30 NOTE — Patient Instructions (Addendum)
Brandon Myers, Thank you for coming in to clinic today.  1. You likely have an allergic response to environmental pollens.  You also have right side eustachian tube dysfunction.  This can be caused with allergies and it is making it hard for you to hear and balance your ear pressure.  - Start Atrovent nasal spray decongestant 2 sprays each nostril up to 4 times daily for 5-7 days - Continue anti-histamine loratadine 10mg  daily,  - also can use Flonase 2 sprays each nostril daily for up to 4-6 weeks - If congestion is worse, start OTC Mucinex (generic: guaifenesis) (or may try Mucinex-DM for cough) up to 7-10 days then stop - If you do not improve, or have fevers you may need an antibiotic or an Ear Nose and Throat specialist.    You may also try: - Drink plenty of fluids to improve congestion - You may try over the counter Nasal Saline spray (Simply Saline, Ocean Spray) as needed to reduce congestion. - Drink warm herbal tea with honey for sore throat. - Start taking Tylenol extra strength 1 to 2 tablets every 6-8 hours for aches or fever/chills for next few days as needed.  Do not take more than 3,000 mg in 24 hours from all medicines.  May take Ibuprofen as well if tolerated 200-400mg  every 8 hours as needed.  If symptoms significantly worsening with persistent fevers/chills despite tylenol/ibpurofen, nausea, vomiting unable to tolerate food/fluids or medicine, body aches, or shortness of breath, sinus pain pressure or worsening productive cough, then follow-up for re-evaluation, may seek more immediate care at Urgent Care or ED if more concerned for emergency.   Please schedule a follow-up appointment with Wilhelmina Mcardle, AGNP to Return 5-7 days if symptoms worsen or fail to improve.  If you have any other questions or concerns, please feel free to call the clinic or send a message through MyChart. You may also schedule an earlier appointment if necessary.  Wilhelmina Mcardle, DNP, AGNP-BC Adult  Gerontology Nurse Practitioner [facility]   Allergic Rhinitis Allergic rhinitis is when the mucous membranes in the nose respond to allergens. Allergens are particles in the air that cause your body to have an allergic reaction. This causes you to release allergic antibodies. Through a chain of events, these eventually cause you to release histamine into the blood stream. Although meant to protect the body, it is this release of histamine that causes your discomfort, such as frequent sneezing, congestion, and an itchy, runny nose. What are the causes? Seasonal allergic rhinitis (hay fever) is caused by pollen allergens that may come from grasses, trees, and weeds. Year-round allergic rhinitis (perennial allergic rhinitis) is caused by allergens such as house dust mites, pet dander, and mold spores. What are the signs or symptoms?  Nasal stuffiness (congestion).  Itchy, runny nose with sneezing and tearing of the eyes. How is this diagnosed? Your health care provider can help you determine the allergen or allergens that trigger your symptoms. If you and your health care provider are unable to determine the allergen, skin or blood testing may be used. Your health care provider will diagnose your condition after taking your health history and performing a physical exam. Your health care provider may assess you for other related conditions, such as asthma, pink eye, or an ear infection. How is this treated? Allergic rhinitis does not have a cure, but it can be controlled by:  Medicines that block allergy symptoms. These may include allergy shots, nasal sprays,  and oral antihistamines.  Avoiding the allergen.  Hay fever may often be treated with antihistamines in pill or nasal spray forms. Antihistamines block the effects of histamine. There are over-the-counter medicines that may help with nasal congestion and swelling around the eyes. Check with your health care provider  before taking or giving this medicine. If avoiding the allergen or the medicine prescribed do not work, there are many new medicines your health care provider can prescribe. Stronger medicine may be used if initial measures are ineffective. Desensitizing injections can be used if medicine and avoidance does not work. Desensitization is when a patient is given ongoing shots until the body becomes less sensitive to the allergen. Make sure you follow up with your health care provider if problems continue. Follow these instructions at home: It is not possible to completely avoid allergens, but you can reduce your symptoms by taking steps to limit your exposure to them. It helps to know exactly what you are allergic to so that you can avoid your specific triggers. Contact a health care provider if:  You have a fever.  You develop a cough that does not stop easily (persistent).  You have shortness of breath.  You start wheezing.  Symptoms interfere with normal daily activities. This information is not intended to replace advice given to you by your health care provider. Make sure you discuss any questions you have with your health care provider. Document Released: 04/12/2001 Document Revised: 03/18/2016 Document Reviewed: 03/25/2013 Elsevier Interactive Patient Education  2017 [employer]

## 2016-12-30 NOTE — Assessment & Plan Note (Signed)
Pt with ongoing allergic rhinitis.  Managed with loratadine 10 mg once daily and intermittent Flonase. Regular Flonase use complicated by epistaxis. No flonase in several weeks.  Acute worsening with eustachian tube dysfunction.  Plan: 1. Optimize symptom control with  - resuming Flonase 2 sprays each nostril 1x daily for 2 weeks.   - Use atrovent nasal spray 2 sprays each nostril 4x daily prn - Continue loratadine - Avoid using decongestant medications.  Only use medications without decongestants. (mucinex and mucinex-DM). 2.  Discussed no antibiotic at this time.  If symptoms persist for an additional 5-7 days will consider. 3. Possible indication for prednisone if no improvement with this treatment plan.   4. Pt to follow up by phone if no improvement in next 5-7 days. 5. If another recurrent sinus infection, pt will need to have referral to ENT.

## 2016-12-30 NOTE — Progress Notes (Addendum)
Subjective:    Patient ID: Brandon Myers, male    DOB: May 30, 1966, 51 y.o.   MRN: [id number]  DONTAI Myers is a 51 y.o. male presenting on 12/30/2016 for URI (sinus drainage, chest congestion, and throat irritation x 2days )   HPI  Sinuses Son was sick: 42 yo took augmentin for similar symptoms.  Pt's symptoms started 2-3 days ago w/ sinus drainage, pressure, chest congestion, itchy/scratchy throat, watery eyes. He feels some chest tightness.  Has only minimal cough r/t trying to clear throat and possible post nasal drip. Denies fever, chills, sweats, nausea, vomiting, diarrhea and constipation.  After his last URI in April, takes loratadine daily.  Took flonase, but made nose bleed so he stopped.  Has been taking OTC cold and sinus w/ decongestant with minimal relief.  Social History  Substance Use Topics  . Smoking status: Never Smoker  . Smokeless tobacco: Never Used  . Alcohol use No    Review of Systems Per HPI unless specifically indicated above     Objective:    BP (!) 164/105   Pulse 79   Temp 98.4 F (36.9 C) (Oral)   Ht 5\' 8"  (1.727 m)   Wt 183 lb 9.6 oz (83.3 kg)   SpO2 99%   BMI 27.92 kg/m    BP recheck 158/110  Wt Readings from Last 3 Encounters:  12/30/16 183 lb 9.6 oz (83.3 kg)  12/06/16 182 lb (82.6 kg)  11/07/16 184 lb (83.5 kg)    Physical Exam  Constitutional: He appears well-developed and well-nourished.  HENT:  Head: Normocephalic and atraumatic.  Right Ear: Hearing, external ear and ear canal normal. No mastoid tenderness. Tympanic membrane is bulging. Tympanic membrane is not injected, not perforated, not erythematous and not retracted. No middle ear effusion.  Left Ear: Hearing, tympanic membrane, external ear and ear canal normal. No mastoid tenderness. Tympanic membrane is not injected, not perforated, not erythematous, not retracted and not bulging.  No middle ear effusion.  Nose: Mucosal edema and rhinorrhea present. Right sinus  exhibits no maxillary sinus tenderness and no frontal sinus tenderness. Left sinus exhibits no maxillary sinus tenderness and no frontal sinus tenderness.  Mouth/Throat: Uvula is midline and mucous membranes are normal. No posterior oropharyngeal edema, posterior oropharyngeal erythema or tonsillar abscesses.  Clear drainage over oropharynx  Skin: He is diaphoretic.   Results for orders placed or performed in visit on 12/06/16  COMPLETE METABOLIC PANEL WITH GFR  Result Value Ref Range   Sodium 139 135 - 146 mmol/L   Potassium 3.9 3.5 - 5.3 mmol/L   Chloride 104 98 - 110 mmol/L   CO2 24 20 - 31 mmol/L   Glucose, Bld 93 65 - 99 mg/dL   BUN 11 7 - 25 mg/dL   Creat 4.09 8.11 - 9.14 mg/dL   Total Bilirubin 0.3 0.2 - 1.2 mg/dL   Alkaline Phosphatase 117 (H) 40 - 115 U/L   AST 40 10 - 40 U/L   ALT 61 (H) 9 - 46 U/L   Total Protein 6.6 6.1 - 8.1 g/dL   Albumin 4.2 3.6 - 5.1 g/dL   Calcium 9.4 8.6 - 78.2 mg/dL   GFR, Est African American >89 >=60 mL/min   GFR, Est Non African American >89 >=60 mL/min  Lipid panel  Result Value Ref Range   Cholesterol 128 <200 mg/dL   Triglycerides 956 <213 mg/dL   HDL 20 (L) >08 mg/dL   Total CHOL/HDL Ratio 6.4 (H) <5.0  Ratio   VLDL 29 <30 mg/dL   LDL Cholesterol 79 <161<100 mg/dL  Hemoglobin [id number]  Result Value Ref Range   Hgb A1c MFr Bld 5.1 <5.7 %   Mean Plasma Glucose 100 mg/dL      Assessment & Plan:   Problem List Items Addressed This Visit      Respiratory   Seasonal allergic rhinitis - Primary    Pt with ongoing allergic rhinitis.  Managed with loratadine 10 mg once daily and intermittent Flonase. Regular Flonase use complicated by epistaxis. No flonase in several weeks.  Acute worsening with eustachian tube dysfunction.  Plan: 1. Optimize symptom control with  - resuming Flonase 2 sprays each nostril 1x daily for 2 weeks.   - Use atrovent nasal spray 2 sprays each nostril 4x daily prn - Continue loratadine - Avoid using decongestant  medications.  Only use medications without decongestants. (mucinex and mucinex-DM). 2.  Discussed no antibiotic at this time.  If symptoms persist for an additional 5-7 days will consider. 3. Possible indication for prednisone if no improvement with this treatment plan.   4. Pt to follow up by phone if no improvement in next 5-7 days. 5. If another recurrent sinus infection, pt will need to have referral to ENT.      Relevant Medications   fluticasone (FLONASE) 50 MCG/ACT nasal spray    Other Visit Diagnoses    Acute non-recurrent frontal sinusitis       Relevant Medications   fluticasone (FLONASE) 50 MCG/ACT nasal spray   Dysfunction of right eustachian tube       Relevant Medications   fluticasone (FLONASE) 50 MCG/ACT nasal spray      Meds ordered this encounter  Medications  . DISCONTD: amLODipine (NORVASC) 10 MG tablet    Refill:  5  . DISCONTD: losartan (COZAAR) 50 MG tablet    Refill:  3  . ipratropium (ATROVENT) 0.06 % nasal spray    Sig: Place 2 sprays into both nostrils 4 (four) times daily.    Dispense:  15 mL    Refill:  0    Order Specific Question:   Supervising Provider    Answer:   Smitty CordsKARAMALEGOS, ALEXANDER J [[id number]]  . fluticasone (FLONASE) 50 MCG/ACT nasal spray    Sig: INSTILL 1 TO 2 SPRAYS IN EACH NOSTRIL EVERY NIGHT AT BEDTIME x 2-3 weeks    Dispense:  16 g    Refill:  12    Order Specific Question:   Supervising Provider    Answer:   Smitty CordsKARAMALEGOS, ALEXANDER J [[id number]]      Follow up plan: Return 5-7 days if symptoms worsen or fail to improve.   Wilhelmina McardleLauren Amazing Cowman, DNP, AGPCNP-BC Adult Gerontology Primary Care Nurse Practitioner [facility] 12/30/2016, 11:50 AM

## 2016-12-30 NOTE — Progress Notes (Signed)
I have reviewed this encounter including the documentation in this note and/or discussed this patient with the provider, Wilhelmina McardleLauren Kennedy, AGPCNP-BC. I am certifying that I agree with the content of this note as supervising physician.  Saralyn PilarAlexander Karamalegos, DO [facility] 12/30/2016, 6:26 PM

## 2017-01-23 ENCOUNTER — Ambulatory Visit: Payer: Medicare Other | Admitting: Family Medicine

## 2017-01-24 ENCOUNTER — Encounter: Payer: Medicare HMO | Admitting: Family Medicine

## 2017-01-30 ENCOUNTER — Ambulatory Visit: Payer: Self-pay | Admitting: Family Medicine

## 2017-02-06 ENCOUNTER — Other Ambulatory Visit: Payer: Self-pay | Admitting: Family Medicine

## 2017-02-06 ENCOUNTER — Ambulatory Visit (INDEPENDENT_AMBULATORY_CARE_PROVIDER_SITE_OTHER): Payer: Medicare Other | Admitting: Family Medicine

## 2017-02-06 ENCOUNTER — Encounter: Payer: Self-pay | Admitting: Family Medicine

## 2017-02-06 VITALS — BP 159/104 | HR 92 | Temp 98.6°F | Ht 68.0 in | Wt 178.0 lb

## 2017-02-06 DIAGNOSIS — M25532 Pain in left wrist: Secondary | ICD-10-CM

## 2017-02-06 DIAGNOSIS — G8929 Other chronic pain: Secondary | ICD-10-CM

## 2017-02-06 DIAGNOSIS — Z7689 Persons encountering health services in other specified circumstances: Secondary | ICD-10-CM | POA: Diagnosis not present

## 2017-02-06 DIAGNOSIS — M79604 Pain in right leg: Secondary | ICD-10-CM

## 2017-02-06 DIAGNOSIS — I10 Essential (primary) hypertension: Secondary | ICD-10-CM | POA: Diagnosis not present

## 2017-02-06 MED ORDER — METOPROLOL SUCCINATE ER 25 MG PO TB24: 25.0000 mg | ORAL_TABLET | Freq: Every day | ORAL | 1 refills | Status: DC

## 2017-02-06 MED ORDER — CYCLOBENZAPRINE HCL 10 MG PO TABS: 10.0000 mg | ORAL_TABLET | Freq: Three times a day (TID) | ORAL | 0 refills | Status: DC | PRN

## 2017-02-06 MED ORDER — MELOXICAM 7.5 MG PO TABS: 7.5000 mg | ORAL_TABLET | Freq: Every day | ORAL | 0 refills | Status: DC

## 2017-02-08 NOTE — Progress Notes (Signed)
BP (!) 159/104   Pulse 92   Temp 98.6 F (37 C)   Ht 5\' 8"  (1.727 m)   Wt 178 lb (80.7 kg)   SpO2 96%   BMI 27.06 kg/m    Subjective:    Patient ID: Brandon GrapesKevin L Myers, male    DOB: 11/15/1965, 51 y.o.   MRN: [id number]  HPI: Brandon Myers is a 51 y.o. male  Chief Complaint  Patient presents with  . Establish Care  . Hypertension    trying to cut back on sodium. Took Amlodipine and it caused swelling,  . Wrist Pain    left wrist. sometimes takes meloxicam for it but that gives him diarrhea.   Patient presents today to establish care.   Has been working with previous provider to get BPs under good control. Currently taking losartan 100 mg. Has been checking home BPs, diastolic consistently elevated over 90. Did not tolerate enalapril or amlodipine. Working on lifestyle modifications. Denies CP, SOB, HAs, syncope.   Only other concern today is some acute on chronic left wrist pain. Is s/p right arm amputation and has issues with overuse pain with his left hand and wrist. Was previously given meloxicam for this but gets diarrhea with the 15 mg dose. Denies numbness, tingling, weakness.   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 Under fairly good control with this regimen.    Relevant past medical, surgical, family and social history reviewed and updated as indicated. Interim medical history since our last visit reviewed. Allergies and medications reviewed and updated.  Review of Systems  Constitutional: Negative.   HENT: Negative.   Respiratory: Negative.   Cardiovascular: Negative.   Gastrointestinal: Negative.   Genitourinary: Negative.   Musculoskeletal: Positive for arthralgias and myalgias. Negative for joint swelling.  Neurological: Negative.   Psychiatric/Behavioral: Negative.    Per HPI unless specifically indicated above     Objective:    BP (!) 159/104   Pulse 92   Temp  98.6 F (37 C)   Ht 5\' 8"  (1.727 m)   Wt 178 lb (80.7 kg)   SpO2 96%   BMI 27.06 kg/m   Wt Readings from Last 3 Encounters:  02/06/17 178 lb (80.7 kg)  12/30/16 183 lb 9.6 oz (83.3 kg)  12/06/16 182 lb (82.6 kg)    Physical Exam  Constitutional: He is oriented to person, place, and time. He appears well-developed and well-nourished. No distress.  HENT:  Head: Atraumatic.  Eyes: Conjunctivae are normal. Pupils are equal, round, and reactive to light.  Neck: Normal range of motion. Neck supple.  Cardiovascular: Normal rate and intact distal pulses.   Pulmonary/Chest: Effort normal and breath sounds normal. No respiratory distress.  Musculoskeletal:  S/p R UE/LE amputations Left wrist ROM intact, strength 5/5, minimal ttp over distal forearm  Neurological: He is alert and oriented to person, place, and time.  Skin: Skin is warm and dry.  Psychiatric: He has a normal mood and affect. His behavior is normal.  Nursing note and vitals reviewed.   Results for orders placed or performed in visit on 12/06/16  COMPLETE METABOLIC PANEL WITH GFR  Result Value Ref Range   Sodium 139 135 - 146 mmol/L   Potassium 3.9 3.5 - 5.3 mmol/L   Chloride 104 98 - 110 mmol/L   CO2 24 20 - 31 mmol/L   Glucose, Bld 93 65 - 99 mg/dL   BUN 11  7 - 25 mg/dL   Creat 1.61 0.96 - 0.45 mg/dL   Total Bilirubin 0.3 0.2 - 1.2 mg/dL   Alkaline Phosphatase 117 (H) 40 - 115 U/L   AST 40 10 - 40 U/L   ALT 61 (H) 9 - 46 U/L   Total Protein 6.6 6.1 - 8.1 g/dL   Albumin 4.2 3.6 - 5.1 g/dL   Calcium 9.4 8.6 - 40.9 mg/dL   GFR, Est African American >89 >=60 mL/min   GFR, Est Non African American >89 >=60 mL/min  Lipid panel  Result Value Ref Range   Cholesterol 128 <200 mg/dL   Triglycerides 811 <914 mg/dL   HDL 20 (L) >78 mg/dL   Total CHOL/HDL Ratio 6.4 (H) <5.0 Ratio   VLDL 29 <30 mg/dL   LDL Cholesterol 79 <295 mg/dL  Hemoglobin A2Z  Result Value Ref Range   Hgb A1c MFr Bld 5.1 <5.7 %   Mean Plasma  Glucose 100 mg/dL      Assessment & Plan:   Problem List Items Addressed This Visit      Cardiovascular and Mediastinum   Essential hypertension    Will add 25 mg metoprolol and monitor closely. Continue home monitoring, call with persistent abnormal readings or side effects. Continue lifestyle modifications        Other   Chronic pain of right lower extremity    Phantom pains since amputations, continue tramadol and gabapentin. Discussed that we will refer to pain management for long term monitoring of these medications. Patient agreeable and referral generated.       Relevant Medications   cyclobenzaprine (FLEXERIL) 10 MG tablet   Other Relevant Orders   Ambulatory referral to Pain Clinic    Other Visit Diagnoses    Encounter to establish care    -  Primary   Due for CPE, will schedule in the near future   Left wrist pain       Suspect overuse. Epsom salt soaks, massage, 7.5 mg meloxicam trial, flexeril prn. F/u if worsening or no improvement       Follow up plan: Return in about 4 weeks (around 03/06/2017) for BP f/u.

## 2017-02-08 NOTE — Assessment & Plan Note (Signed)
Phantom pains since amputations, continue tramadol and gabapentin. Discussed that we will refer to pain management for long term monitoring of these medications. Patient agreeable and referral generated.

## 2017-02-08 NOTE — Assessment & Plan Note (Addendum)
Will add 25 mg metoprolol and monitor closely. Continue home monitoring, call with persistent abnormal readings or side effects. Continue lifestyle modifications

## 2017-02-23 ENCOUNTER — Ambulatory Visit (INDEPENDENT_AMBULATORY_CARE_PROVIDER_SITE_OTHER): Payer: Medicare Other

## 2017-02-23 VITALS — BP 122/78 | HR 89 | Temp 97.9°F | Resp 17 | Ht 68.0 in | Wt 179.5 lb

## 2017-02-23 DIAGNOSIS — Z114 Encounter for screening for human immunodeficiency virus [HIV]: Secondary | ICD-10-CM

## 2017-02-23 DIAGNOSIS — Z0000 Encounter for general adult medical examination without abnormal findings: Secondary | ICD-10-CM | POA: Diagnosis not present

## 2017-02-23 NOTE — Patient Instructions (Signed)
Brandon Myers , Thank you for taking time to come for your Medicare Wellness Visit. I appreciate your ongoing commitment to your health goals. Please review the following plan we discussed and let me know if I can assist you in the future.   Screening recommendations/referrals: Colonoscopy: due at age 51 Recommended yearly ophthalmology/optometry visit for glaucoma screening and checkup Recommended yearly dental visit for hygiene and checkup  Vaccinations: Influenza vaccine: up to date, due 04/2017 Pneumococcal vaccine: prevnar 13 done  Tdap vaccine: up to date Shingles vaccine: due at age 49  Advanced directives: Advance directive discussed with you today. I have provided a copy for you to complete at home and have notarized. Once this is complete please bring a copy in to our office so we can scan it into your chart.  Conditions/risks identified: Recommend drinking at least 4-5 glasses of water a day   Next appointment: Follow up on 03/06/2017 at 2:30pm with Margit Hanksachel Lane,PA. Follow up in one year for your annual wellness exam  Preventive Care 40-64 Years, Male Preventive care refers to lifestyle choices and visits with your health care provider that can promote health and wellness. What does preventive care include?  A yearly physical exam. This is also called an annual well check.  Dental exams once or twice a year.  Routine eye exams. Ask your health care provider how often you should have your eyes checked.  Personal lifestyle choices, including:  Daily care of your teeth and gums.  Regular physical activity.  Eating a healthy diet.  Avoiding tobacco and drug use.  Limiting alcohol use.  Practicing safe sex.  Taking low-dose aspirin every day starting at age 49. What happens during an annual well check? The services and screenings done by your health care provider during your annual well check will depend on your age, overall health, lifestyle risk factors, and family  history of disease. Counseling  Your health care provider may ask you questions about your:  Alcohol use.  Tobacco use.  Drug use.  Emotional well-being.  Home and relationship well-being.  Sexual activity.  Eating habits.  Work and work Astronomerenvironment. Screening  You may have the following tests or measurements:  Height, weight, and BMI.  Blood pressure.  Lipid and cholesterol levels. These may be checked every 5 years, or more frequently if you are over 49 years old.  Skin check.  Lung cancer screening. You may have this screening every year starting at age 49 if you have a 30-pack-year history of smoking and currently smoke or have quit within the past 15 years.  Fecal occult blood test (FOBT) of the stool. You may have this test every year starting at age 49.  Flexible sigmoidoscopy or colonoscopy. You may have a sigmoidoscopy every 5 years or a colonoscopy every 10 years starting at age 49.  Prostate cancer screening. Recommendations will vary depending on your family history and other risks.  Hepatitis C blood test.  Hepatitis B blood test.  Sexually transmitted disease (STD) testing.  Diabetes screening. This is done by checking your blood sugar (glucose) after you have not eaten for a while (fasting). You may have this done every 1-3 years. Discuss your test results, treatment options, and if necessary, the need for more tests with your health care provider. Vaccines  Your health care provider may recommend certain vaccines, such as:  Influenza vaccine. This is recommended every year.  Tetanus, diphtheria, and acellular pertussis (Tdap, Td) vaccine. You may need a Td booster  every 10 years.  Zoster vaccine. You may need this after age 62.  Pneumococcal 13-valent conjugate (PCV13) vaccine. You may need this if you have certain conditions and have not been vaccinated.  Pneumococcal polysaccharide (PPSV23) vaccine. You may need one or two doses if you smoke  cigarettes or if you have certain conditions. Talk to your health care provider about which screenings and vaccines you need and how often you need them. This information is not intended to replace advice given to you by your health care provider. Make sure you discuss any questions you have with your health care provider. Document Released: 08/14/2015 Document Revised: 04/06/2016 Document Reviewed: 05/19/2015 Elsevier Interactive Patient Education  2017 [place] Prevention in the Home Falls can cause injuries. They can happen to people of all ages. There are many things you can do to make your home safe and to help prevent falls. What can I do on the outside of my home?  Regularly fix the edges of walkways and driveways and fix any cracks.  Remove anything that might make you trip as you walk through a door, such as a raised step or threshold.  Trim any bushes or trees on the path to your home.  Use bright outdoor lighting.  Clear any walking paths of anything that might make someone trip, such as rocks or tools.  Regularly check to see if handrails are loose or broken. Make sure that both sides of any steps have handrails.  Any raised decks and porches should have guardrails on the edges.  Have any leaves, snow, or ice cleared regularly.  Use sand or salt on walking paths during winter.  Clean up any spills in your garage right away. This includes oil or grease spills. What can I do in the bathroom?  Use night lights.  Install grab bars by the toilet and in the tub and shower. Do not use towel bars as grab bars.  Use non-skid mats or decals in the tub or shower.  If you need to sit down in the shower, use a plastic, non-slip stool.  Keep the floor dry. Clean up any water that spills on the floor as soon as it happens.  Remove soap buildup in the tub or shower regularly.  Attach bath mats securely with double-sided non-slip rug tape.  Do not have throw rugs  and other things on the floor that can make you trip. What can I do in the bedroom?  Use night lights.  Make sure that you have a light by your bed that is easy to reach.  Do not use any sheets or blankets that are too big for your bed. They should not hang down onto the floor.  Have a firm chair that has side arms. You can use this for support while you get dressed.  Do not have throw rugs and other things on the floor that can make you trip. What can I do in the kitchen?  Clean up any spills right away.  Avoid walking on wet floors.  Keep items that you use a lot in easy-to-reach places.  If you need to reach something above you, use a strong step stool that has a grab bar.  Keep electrical cords out of the way.  Do not use floor polish or wax that makes floors slippery. If you must use wax, use non-skid floor wax.  Do not have throw rugs and other things on the floor that can make you trip. What  can I do with my stairs?  Do not leave any items on the stairs.  Make sure that there are handrails on both sides of the stairs and use them. Fix handrails that are broken or loose. Make sure that handrails are as long as the stairways.  Check any carpeting to make sure that it is firmly attached to the stairs. Fix any carpet that is loose or worn.  Avoid having throw rugs at the top or bottom of the stairs. If you do have throw rugs, attach them to the floor with carpet tape.  Make sure that you have a light switch at the top of the stairs and the bottom of the stairs. If you do not have them, ask someone to add them for you. What else can I do to help prevent falls?  Wear shoes that:  Do not have high heels.  Have rubber bottoms.  Are comfortable and fit you well.  Are closed at the toe. Do not wear sandals.  If you use a stepladder:  Make sure that it is fully opened. Do not climb a closed stepladder.  Make sure that both sides of the stepladder are locked into  place.  Ask someone to hold it for you, if possible.  Clearly mark and make sure that you can see:  Any grab bars or handrails.  First and last steps.  Where the edge of each step is.  Use tools that help you move around (mobility aids) if they are needed. These include:  Canes.  Walkers.  Scooters.  Crutches.  Turn on the lights when you go into a dark area. Replace any light bulbs as soon as they burn out.  Set up your furniture so you have a clear path. Avoid moving your furniture around.  If any of your floors are uneven, fix them.  If there are any pets around you, be aware of where they are.  Review your medicines with your doctor. Some medicines can make you feel dizzy. This can increase your chance of falling. Ask your doctor what other things that you can do to help prevent falls. This information is not intended to replace advice given to you by your health care provider. Make sure you discuss any questions you have with your health care provider. Document Released: 05/14/2009 Document Revised: 12/24/2015 Document Reviewed: 08/22/2014 Elsevier Interactive Patient Education  2017 [employer].

## 2017-02-23 NOTE — Progress Notes (Signed)
Subjective:   Brandon Myers is a 51 y.o. male who presents for an Initial Medicare Annual Wellness Visit.  Review of Systems  Cardiac Risk Factors include: male gender;advanced age (>3055men, 61>65 women);dyslipidemia;hypertension    Objective:    Today's Vitals   02/23/17 1431  BP: 122/78  Pulse: 89  Resp: 17  Temp: 97.9 F (36.6 C)  Weight: 179 lb 8 oz (81.4 kg)  Height: 5\' 8"  (1.727 m)   Body mass index is 27.29 kg/m.  Current Medications (verified) Outpatient Encounter Prescriptions as of 02/23/2017  Medication Sig  . cyclobenzaprine (FLEXERIL) 10 MG tablet Take 1 tablet (10 mg total) by mouth 3 (three) times daily as needed for muscle spasms.  Seward Speck. Filgrastim-sndz (ZARXIO) 480 MCG/0.8ML SOSY Inject 0.8 mLs into the muscle every other day.  . fluticasone (FLONASE) 50 MCG/ACT nasal spray INSTILL 1 TO 2 SPRAYS IN EACH NOSTRIL EVERY NIGHT AT BEDTIME x 2-3 weeks  . gabapentin (NEURONTIN) 300 MG capsule Take 1 capsule once day in mid day  . gabapentin (NEURONTIN) 800 MG tablet Take 1 tablet (800 mg total) by mouth 3 (three) times daily.  Marland Kitchen. ipratropium (ATROVENT) 0.06 % nasal spray USE 2 SPRAYS IN EACH NOSTRIL FOUR TIMES DAILY (Patient not taking: Reported on 02/06/2017)  . loratadine (CLARITIN) 10 MG tablet Take 1 tablet (10 mg total) by mouth daily.  Marland Kitchen. losartan (COZAAR) 100 MG tablet Take 1 tablet (100 mg total) by mouth daily.  . Melatonin 10 MG TABS Take 10 mg by mouth at bedtime.  . meloxicam (MOBIC) 7.5 MG tablet TAKE 1 TABLET(7.5 MG) BY MOUTH DAILY  . metoprolol succinate (TOPROL-XL) 25 MG 24 hr tablet TAKE 1 TABLET(25 MG) BY MOUTH DAILY  . omeprazole (PRILOSEC) 40 MG capsule Take 1 capsule (40 mg total) by mouth daily.  . traMADol (ULTRAM) 50 MG tablet Take d1 in evening for leg pain  . VENTOLIN HFA 108 (90 Base) MCG/ACT inhaler INHALE 2 PUFFS BY MOUTH FOUR TIMES DAILY AS NEEDED   No facility-administered encounter medications on file as of 02/23/2017.     Allergies  (verified) Enalapril; Prochlorperazine; Sulfa antibiotics; and Strawberry extract   History: Past Medical History:  Diagnosis Date  . Allergy   . GERD (gastroesophageal reflux disease)   . Hypertension   . 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 ABOVE ELBOW ARM AMPUTATION Right 1993  . AMPUTATION    . APPENDECTOMY    . COLON SURGERY  1994  . LEG AMPUTATION THROUGH KNEE Right 1993   Family History  Problem Relation Age of Onset  . Heart Problems Mother   . Asthma Mother   . Depression Mother   . Thyroid disease Mother   . Alzheimer's disease Mother   . Stroke Maternal Grandmother   . Cancer Father        skin  . Cancer Paternal Grandmother   . Cancer Paternal Grandfather    Social History   Occupational History  . Not on file.   Social History Main Topics  . Smoking status: Never Smoker  . Smokeless tobacco: Never Used  . Alcohol use No  . Drug use: No  . Sexual activity: Not on file   Tobacco Counseling Counseling given: Not Answered   Activities of Daily Living In your present state of health, do you have any difficulty performing the following activities: 02/23/2017 06/07/2016  Hearing? N N  Vision? N N  Difficulty concentrating or making  decisions? N N  Walking or climbing stairs? N Y  Dressing or bathing? N N  Doing errands, shopping? N N  Preparing Food and eating ? N -  Using the Toilet? N -  In the past six months, have you accidently leaked urine? N -  Do you have problems with loss of bowel control? N -  Managing your Medications? N -  Managing your Finances? N -  Housekeeping or managing your Housekeeping? N -  Some recent data might be hidden    Immunizations and Health Maintenance Immunization History  Administered Date(s) Administered  . Influenza,inj,Quad PF,36+ Mos 04/27/2015, 05/10/2016  . Pneumococcal Conjugate-13 01/10/2017   There are no preventive care reminders to display for this  patient.  Patient Care Team: Particia NearingLane, Rachel Elizabeth, PA-C as PCP - General (Family Medicine)  Indicate any recent Medical Services you may have received from other than [facility] providers in the past year (date may be approximate).    Assessment:   This is a routine wellness examination for Brandon Myers.   Hearing/Vision screen Vision Screening Comments: [facility] annually   Dietary issues and exercise activities discussed: Exercise limited by: orthopedic condition(s)  Goals    . Increase water intake          Recommend drinking at least 4-5 glasses of water a day       Depression Screen PHQ 2/9 Scores 02/23/2017 10/04/2016 01/07/2016 08/31/2015  PHQ - 2 Score 0 0 0 0    Fall Risk Fall Risk  02/23/2017 10/04/2016 01/07/2016 08/31/2015 04/27/2015  Falls in the past year? Yes No No No No  Number falls in past yr: 2 or more - - - -  Injury with Fall? No - - - -  Follow up Falls prevention discussed - - - -    Cognitive Function:     6CIT Screen 02/23/2017  What Year? 0 points  What month? 0 points  What time? 0 points  Count back from 20 0 points  Months in reverse 0 points  Repeat phrase 0 points  Total Score 0    Screening Tests Health Maintenance  Topic Date Due  . HIV Screening  02/06/2018 (Originally 05/02/1983)  . INFLUENZA VACCINE  03/01/2017  . TETANUS/TDAP  08/04/2024        Plan:    I have personally reviewed and addressed the Medicare Annual Wellness questionnaire and have noted the following in the patient's chart:  A. Medical and social history B. Use of alcohol, tobacco or illicit drugs  C. Current medications and supplements D. Functional ability and status E.  Nutritional status F.  Physical activity G. Advance directives H. List of other physicians I.  Hospitalizations, surgeries, and ER visits in previous 12 months J.  Vitals K. Screenings such as hearing and vision if needed, cognitive and depression L. Referrals and appointments   In addition,  I have reviewed and discussed with patient certain preventive protocols, quality metrics, and best practice recommendations. A written personalized care plan for preventive services as well as general preventive health recommendations were provided to patient.   Signed,  Marin Robertsiffany Hill, LPN Nurse Health Advisor   MD Recommendations: none

## 2017-03-06 ENCOUNTER — Other Ambulatory Visit: Payer: Self-pay | Admitting: Family Medicine

## 2017-03-06 ENCOUNTER — Ambulatory Visit (INDEPENDENT_AMBULATORY_CARE_PROVIDER_SITE_OTHER): Payer: Medicare Other | Admitting: Family Medicine

## 2017-03-06 ENCOUNTER — Encounter: Payer: Self-pay | Admitting: Family Medicine

## 2017-03-06 VITALS — BP 140/78 | HR 77 | Wt 179.0 lb

## 2017-03-06 DIAGNOSIS — I10 Essential (primary) hypertension: Secondary | ICD-10-CM

## 2017-03-06 MED ORDER — ONDANSETRON 4 MG PO TBDP: 4.0000 mg | ORAL_TABLET | Freq: Three times a day (TID) | ORAL | 0 refills | Status: DC | PRN

## 2017-03-06 MED ORDER — METOPROLOL SUCCINATE ER 50 MG PO TB24: 50.0000 mg | ORAL_TABLET | Freq: Every day | ORAL | 1 refills | Status: DC

## 2017-03-06 NOTE — Progress Notes (Signed)
   BP 140/78   Pulse 77   Wt 179 lb (81.2 kg)   SpO2 97%   BMI 27.22 kg/m    Subjective:    Patient ID: Brandon Myers, male    DOB: 03/16/1966, 51 y.o.   MRN: [id number]  HPI: Brandon Myers is a 51 y.o. male  Chief Complaint  Patient presents with  . Follow-up  . Hypertension   Patient presents today for BP follow up. Taking 100 mg losartan and newly added 25 mg metoprolol faithfully with no side effects. Denies CP, dizziness,syncope. Has been checking home BPs and getting 140s-150s/80-90s.   Relevant past medical, surgical, family and social history reviewed and updated as indicated. Interim medical history since our last visit reviewed. Allergies and medications reviewed and updated.  Review of Systems  Constitutional: Negative.   HENT: Negative.   Eyes: Negative.   Respiratory: Negative.   Cardiovascular: Negative.   Gastrointestinal: Negative.   Musculoskeletal: Negative.   Neurological: Negative.   Psychiatric/Behavioral: Negative.    Per HPI unless specifically indicated above     Objective:    BP 140/78   Pulse 77   Wt 179 lb (81.2 kg)   SpO2 97%   BMI 27.22 kg/m   Wt Readings from Last 3 Encounters:  03/06/17 179 lb (81.2 kg)  02/23/17 179 lb 8 oz (81.4 kg)  02/06/17 178 lb (80.7 kg)    Physical Exam  Constitutional: He is oriented to person, place, and time. He appears well-developed and well-nourished. No distress.  HENT:  Head: Atraumatic.  Eyes: Pupils are equal, round, and reactive to light. Conjunctivae are normal. No scleral icterus.  Neck: Normal range of motion. Neck supple.  Cardiovascular: Normal rate and normal heart sounds.   Pulmonary/Chest: Effort normal and breath sounds normal. No respiratory distress.  Musculoskeletal: Normal range of motion.  Neurological: He is alert and oriented to person, place, and time.  Skin: Skin is warm and dry.  Psychiatric: He has a normal mood and affect. His behavior is normal.  Nursing note and  vitals reviewed.     Assessment & Plan:   Problem List Items Addressed This Visit      Cardiovascular and Mediastinum   Essential hypertension - Primary    Improved with addition of 25 mg metoprolol but still consistently at high range of normal or just above. Pt agreeable to increasing to 50 mg metoprolol daily and continuing home monitoring. DASH diet, exercise as tolerated. Follow up with us for persistent abnormals.       Relevant Medications   metoprolol succinate (TOPROL XL) 50 MG 24 hr tablet       Follow up plan: Return in about 2 months (around 05/06/2017) for BP.

## 2017-03-06 NOTE — Patient Instructions (Signed)
Take two 25 mg tablets of the metoprolol daily until you run out, then pick up the 50 mg metoprolol and start one tablet daily

## 2017-03-06 NOTE — Assessment & Plan Note (Signed)
Improved with addition of 25 mg metoprolol but still consistently at high range of normal or just above. Pt agreeable to increasing to 50 mg metoprolol daily and continuing home monitoring. DASH diet, exercise as tolerated. Follow up with us for persistent abnormals.

## 2017-03-24 ENCOUNTER — Telehealth: Payer: Self-pay | Admitting: Family Medicine

## 2017-03-24 NOTE — Telephone Encounter (Signed)
Patient is [facility] right now for an appointment. Provider is requesting a prescription for "above the knee prosthetic" and also office notes.  Please Advise.  Thank you

## 2017-03-24 NOTE — Telephone Encounter (Signed)
Called and spoke with [facility] to get clarification on what they are needing from [place]. She states that the patient is there to have a consultation for a new prosthetic leg and she needs OV notes and an RX for a below the knee prosthetic. I explained that Fleet Contras was not in the office today but would be back Monday. Needs information faxed to [phone].

## 2017-03-27 NOTE — Telephone Encounter (Signed)
Routing to provider, needs a rx for prosthetic leg written.

## 2017-03-27 NOTE — Telephone Encounter (Signed)
Prescription signed by Fleet Contras and faxed to [facility] along with the OV note.

## 2017-03-30 ENCOUNTER — Ambulatory Visit (INDEPENDENT_AMBULATORY_CARE_PROVIDER_SITE_OTHER): Payer: Medicare Other | Admitting: Family Medicine

## 2017-03-30 ENCOUNTER — Encounter: Payer: Self-pay | Admitting: Family Medicine

## 2017-03-30 VITALS — BP 129/85 | HR 78 | Wt 180.0 lb

## 2017-03-30 DIAGNOSIS — M79604 Pain in right leg: Secondary | ICD-10-CM | POA: Diagnosis not present

## 2017-03-30 DIAGNOSIS — Z79899 Other long term (current) drug therapy: Secondary | ICD-10-CM

## 2017-03-30 DIAGNOSIS — G8929 Other chronic pain: Secondary | ICD-10-CM

## 2017-03-30 NOTE — Progress Notes (Signed)
BP 129/85   Pulse 78   Wt 180 lb (81.6 kg)   SpO2 97%   BMI 27.37 kg/m    Subjective:    Patient ID: Brandon Myers, male    DOB: 01-Oct-1965, 52 y.o.   MRN: [id number]  HPI: Brandon Myers is a 51 y.o. male  Chief Complaint  Patient presents with  . Follow-up   Patient presents today for pre-op forms for a prosthetic part-replacement procedure for RLE prosthesis. Part has been broken for some time now and it's causing pt a significant amount of pain and starting to break down the skin at the connection to his body. Needs a written prescription and form signed. No other concerns today.    Also states the pain clinic won't accept his insurance and he will be running out of his prn tramadol in the near future. Does not take every day, just some nights before bed when the pain is severe.   Relevant past medical, surgical, family and social history reviewed and updated as indicated. Interim medical history since our last visit reviewed. Allergies and medications reviewed and updated.  Review of Systems  Constitutional: Negative.   Respiratory: Negative.   Cardiovascular: Negative.   Gastrointestinal: Negative.   Genitourinary: Negative.   Musculoskeletal: Negative.   Skin: Positive for wound (from prosthesis).  Neurological: Negative.   Psychiatric/Behavioral: Negative.     Per HPI unless specifically indicated above     Objective:    BP 129/85   Pulse 78   Wt 180 lb (81.6 kg)   SpO2 97%   BMI 27.37 kg/m   Wt Readings from Last 3 Encounters:  03/30/17 180 lb (81.6 kg)  03/06/17 179 lb (81.2 kg)  02/23/17 179 lb 8 oz (81.4 kg)    Physical Exam  Constitutional: He is oriented to person, place, and time. He appears well-developed and well-nourished. No distress.  HENT:  Head: Atraumatic.  Eyes: Pupils are equal, round, and reactive to light. Conjunctivae are normal.  Neck: Normal range of motion. Neck supple.  Cardiovascular: Normal rate and normal heart sounds.     Pulmonary/Chest: Effort normal and breath sounds normal. No respiratory distress.  Musculoskeletal: Normal range of motion.  Neurological: He is alert and oriented to person, place, and time.  Skin: Skin is warm and dry.  Psychiatric: He has a normal mood and affect. His behavior is normal.  Nursing note and vitals reviewed.   Results for orders placed or performed in visit on 12/06/16  COMPLETE METABOLIC PANEL WITH GFR  Result Value Ref Range   Sodium 139 135 - 146 mmol/L   Potassium 3.9 3.5 - 5.3 mmol/L   Chloride 104 98 - 110 mmol/L   CO2 24 20 - 31 mmol/L   Glucose, Bld 93 65 - 99 mg/dL   BUN 11 7 - 25 mg/dL   Creat 7.84 6.96 - 2.95 mg/dL   Total Bilirubin 0.3 0.2 - 1.2 mg/dL   Alkaline Phosphatase 117 (H) 40 - 115 U/L   AST 40 10 - 40 U/L   ALT 61 (H) 9 - 46 U/L   Total Protein 6.6 6.1 - 8.1 g/dL   Albumin 4.2 3.6 - 5.1 g/dL   Calcium 9.4 8.6 - 28.4 mg/dL   GFR, Est African American >89 >=60 mL/min   GFR, Est Non African American >89 >=60 mL/min  Lipid panel  Result Value Ref Range   Cholesterol 128 <200 mg/dL   Triglycerides 132 <440 mg/dL  HDL 20 (L) >40 mg/dL   Total CHOL/HDL Ratio 6.4 (H) <5.0 Ratio   VLDL 29 <30 mg/dL   LDL Cholesterol 79 <161<100 mg/dL  Hemoglobin [id number]  Result Value Ref Range   Hgb A1c MFr Bld 5.1 <5.7 %   Mean Plasma Glucose 100 mg/dL      Assessment & Plan:   Problem List Items Addressed This Visit      Other   Chronic pain of right lower extremity - Primary    Paperwork completed for prosthetic procedure. Continue gabapentin, meloxicam, and prn tramadol.       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 Urine drug screen performed today. Will take over his tramadol management          Follow up plan: Return for as scheduled.

## 2017-03-31 LAB — URINE DRUGS OF ABUSE SCREEN W ALC, ROUTINE (REF LAB)
AMPHETAMINES, URINE: NEGATIVE ng/mL
Barbiturate Quant, Ur: NEGATIVE ng/mL
Benzodiazepine Quant, Ur: NEGATIVE ng/mL
COCAINE (METAB.): NEGATIVE ng/mL
Cannabinoid Quant, Ur: NEGATIVE ng/mL
ETHANOL, URINE: NEGATIVE %
METHADONE SCREEN, URINE: NEGATIVE ng/mL
Opiate Quant, Ur: NEGATIVE ng/mL
PCP Quant, Ur: NEGATIVE ng/mL
PROPOXYPHENE: NEGATIVE ng/mL

## 2017-04-01 ENCOUNTER — Other Ambulatory Visit: Payer: Self-pay | Admitting: Family Medicine

## 2017-04-02 DIAGNOSIS — Z79899 Other long term (current) drug therapy: Secondary | ICD-10-CM | POA: Insufficient documentation

## 2017-04-02 HISTORY — DX: Other long term (current) drug therapy: Z79.899

## 2017-04-02 NOTE — Patient Instructions (Signed)
Follow up as scheduled.  

## 2017-04-02 NOTE — Assessment & Plan Note (Signed)
Paperwork completed for prosthetic procedure. Continue gabapentin, meloxicam, and prn tramadol.

## 2017-04-02 NOTE — Assessment & Plan Note (Signed)
Contract reviewed and signed, pt agreeable to policy. Urine drug screen performed today. Will take over his tramadol management

## 2017-04-04 NOTE — Telephone Encounter (Signed)
Patient wants rx changed to a 90 day supply. Next appt on 05/08/17

## 2017-04-19 ENCOUNTER — Other Ambulatory Visit: Payer: Self-pay | Admitting: Family Medicine

## 2017-04-19 ENCOUNTER — Encounter: Payer: Self-pay | Admitting: Family Medicine

## 2017-04-19 DIAGNOSIS — G546 Phantom limb syndrome with pain: Secondary | ICD-10-CM

## 2017-04-19 MED ORDER — GABAPENTIN 800 MG PO TABS: 800.0000 mg | ORAL_TABLET | Freq: Three times a day (TID) | ORAL | 3 refills | Status: DC

## 2017-04-19 MED ORDER — GABAPENTIN 300 MG PO CAPS: ORAL_CAPSULE | ORAL | 1 refills | Status: DC

## 2017-04-27 ENCOUNTER — Other Ambulatory Visit: Payer: Self-pay | Admitting: Family Medicine

## 2017-05-08 ENCOUNTER — Encounter: Payer: Self-pay | Admitting: Family Medicine

## 2017-05-08 ENCOUNTER — Ambulatory Visit (INDEPENDENT_AMBULATORY_CARE_PROVIDER_SITE_OTHER): Payer: Medicare Other | Admitting: Family Medicine

## 2017-05-08 VITALS — BP 121/86 | HR 68 | Temp 98.1°F | Ht 68.0 in | Wt 180.3 lb

## 2017-05-08 DIAGNOSIS — M79604 Pain in right leg: Secondary | ICD-10-CM

## 2017-05-08 DIAGNOSIS — Z23 Encounter for immunization: Secondary | ICD-10-CM | POA: Diagnosis not present

## 2017-05-08 DIAGNOSIS — G8929 Other chronic pain: Secondary | ICD-10-CM | POA: Diagnosis not present

## 2017-05-08 MED ORDER — TRAMADOL HCL 50 MG PO TABS: 50.0000 mg | ORAL_TABLET | Freq: Two times a day (BID) | ORAL | 0 refills | Status: DC | PRN

## 2017-05-08 MED ORDER — PHENAZOPYRIDINE HCL 100 MG PO TABS: 100.0000 mg | ORAL_TABLET | Freq: Three times a day (TID) | ORAL | 1 refills | Status: DC | PRN

## 2017-05-08 MED ORDER — NITROFURANTOIN MONOHYD MACRO 100 MG PO CAPS: 100.0000 mg | ORAL_CAPSULE | Freq: Two times a day (BID) | ORAL | 0 refills | Status: DC

## 2017-05-08 NOTE — Assessment & Plan Note (Signed)
Will increase tramadol to BID prn. Continue gabapentin and flexeril. F/u in 3 months, sooner if no improvement in symptom control.

## 2017-05-08 NOTE — Patient Instructions (Addendum)
Follow up in 3 months

## 2017-05-08 NOTE — Progress Notes (Signed)
BP 121/86 (BP Location: Left Arm, Patient Position: Sitting, Cuff Size: Normal)   Pulse 68   Temp 98.1 F (36.7 C)   Ht  (1.727 m)   Wt 180 lb 4.8 oz (81.8 kg)   SpO2 99%   BMI 27.41 kg/m    Subjective:    Patient ID: Brandon Myers, male    DOB: 1965/08/03, 51 y.o.   MRN: [id number]  HPI: Brandon Myers is a 51 y.o. male  Chief Complaint  Patient presents with  . Follow-up  . Medication Management    Refill on Tramadol   Patient presents today for refill on his chronic pain medication for his phantom limb pains RLE. Tramadol has not been helping as much as he would like anymore. Taking 1 nightly as needed right now and still having a fair amount of pain. Compliant with gabapentin and flexeril.   Relevant past medical, surgical, family and social history reviewed and updated as indicated. Interim medical history since our last visit reviewed. Allergies and medications reviewed and updated.  Review of Systems  Constitutional: Negative.   HENT: Negative.   Respiratory: Negative.   Gastrointestinal: Negative.   Musculoskeletal: Positive for arthralgias (worst in RLE, phantom pains).  Neurological: Negative.   Psychiatric/Behavioral: Negative.    Per HPI unless specifically indicated above     Objective:    BP 121/86 (BP Location: Left Arm, Patient Position: Sitting, Cuff Size: Normal)   Pulse 68   Temp 98.1 F (36.7 C)   Ht  (1.727 m)   Wt 180 lb 4.8 oz (81.8 kg)   SpO2 99%   BMI 27.41 kg/m   Wt Readings from Last 3 Encounters:  05/08/17 180 lb 4.8 oz (81.8 kg)  03/30/17 180 lb (81.6 kg)  03/06/17 179 lb (81.2 kg)    Physical Exam  Constitutional: He is oriented to person, place, and time. He appears well-developed and well-nourished. No distress.  HENT:  Head: Atraumatic.  Eyes: Pupils are equal, round, and reactive to light. Conjunctivae are normal.  Neck: Normal range of motion. Neck supple.  Cardiovascular: Normal rate and normal heart sounds.    Musculoskeletal: Normal range of motion.  Neurological: He is alert and oriented to person, place, and time.  Skin: Skin is warm and dry.  Psychiatric: He has a normal mood and affect. His behavior is normal.  Nursing note and vitals reviewed.   Results for orders placed or performed in visit on 03/30/17  Urine drugs of abuse scrn w alc, routine (Ref Lab)  Result Value Ref Range   Amphetamines, Urine Negative Cutoff=1000 ng/mL   Barbiturate Quant, Ur Negative Cutoff=300 ng/mL   Benzodiazepine Quant, Ur Negative Cutoff=300 ng/mL   Cannabinoid Quant, Ur Negative Cutoff=50 ng/mL   Cocaine (Metab.) Negative Cutoff=300 ng/mL   Opiate Quant, Ur Negative Cutoff=300 ng/mL   PCP Quant, Ur Negative Cutoff=25 ng/mL   Methadone Screen, Urine Negative Cutoff=300 ng/mL   Propoxyphene Negative Cutoff=300 ng/mL   Ethanol, Urine Negative Cutoff=0.020 %      Assessment & Plan:   Problem List Items Addressed This Visit      Other   Chronic pain of right lower extremity - Primary    Will increase tramadol to BID prn. Continue gabapentin and flexeril. F/u in 3 months, sooner if no improvement in symptom control.       Relevant Medications   traMADol (ULTRAM) 50 MG tablet    Other Visit Diagnoses    Need for influenza  vaccination       Relevant Orders   Flu Vaccine QUAD 6+ mos PF IM (Fluarix Quad PF) (Completed)       Follow up plan: Return in about 3 months (around 08/08/2017) for Pain f/u.

## 2017-05-11 ENCOUNTER — Encounter: Payer: Self-pay | Admitting: Family Medicine

## 2017-05-30 ENCOUNTER — Other Ambulatory Visit: Payer: Self-pay | Admitting: Family Medicine

## 2017-06-01 ENCOUNTER — Telehealth: Payer: Self-pay | Admitting: Family Medicine

## 2017-06-01 NOTE — Telephone Encounter (Signed)
Left message to call back  

## 2017-06-01 NOTE — Telephone Encounter (Signed)
RX called back into the pharmacy. Patient notified.

## 2017-06-01 NOTE — Telephone Encounter (Signed)
RX was written on 04/04/17 for a qty of 90 and 1 refill.

## 2017-06-01 NOTE — Telephone Encounter (Signed)
Pt requesting refill on Metoprolol. Med was refilled per pt 04/27/17 for 30 tablets

## 2017-06-01 NOTE — Telephone Encounter (Signed)
Pt requesting refill on Metoprolol. Med was refilled per pt 04/27/17 for 30 tablets 

## 2017-06-01 NOTE — Telephone Encounter (Signed)
Not a patient at [facility]

## 2017-06-01 NOTE — Telephone Encounter (Signed)
Copied from CRM [phone]. Topic: Quick Communication - See Telephone Encounter >> Jun 01, 2017  1:53 PM Eston Mouldavis, Flem Enderle B wrote: CRM for notification. See Telephone encounter for:  06/01/17.  Refill metoprol

## 2017-06-09 ENCOUNTER — Ambulatory Visit: Payer: Medicare Other | Admitting: Family Medicine

## 2017-06-09 VITALS — BP 136/88 | HR 65 | Temp 98.1°F | Wt 182.7 lb

## 2017-06-09 DIAGNOSIS — J069 Acute upper respiratory infection, unspecified: Secondary | ICD-10-CM | POA: Diagnosis not present

## 2017-06-09 MED ORDER — GUAIFENESIN ER 600 MG PO TB12: 600.0000 mg | ORAL_TABLET | Freq: Two times a day (BID) | ORAL | 0 refills | Status: DC | PRN

## 2017-06-09 MED ORDER — HYDROCOD POLST-CPM POLST ER 10-8 MG/5ML PO SUER: 5.0000 mL | Freq: Two times a day (BID) | ORAL | 0 refills | Status: DC | PRN

## 2017-06-09 NOTE — Progress Notes (Signed)
   BP 136/88 (BP Location: Left Arm, Patient Position: Sitting, Cuff Size: Normal)   Pulse 65   Temp 98.1 F (36.7 C) (Oral)   Wt 182 lb 11.2 oz (82.9 kg)   SpO2 98%   BMI 27.78 kg/m    Subjective:    Patient ID: Joylene GrapesKevin L Allie, male    DOB: 08/07/1965, 51 y.o.   MRN: [id number]  HPI: Joylene GrapesKevin L Hoelting is a 51 y.o. male  Chief Complaint  Patient presents with  . Cough    x's 2 days. Children were sick previously.   . Chest Pain  . Sinusitis  . Nasal Congestion   Congestion, chest tightness, scratchy throat, hoarse voice, chills x 2 days. Denies fever, body aches, SOB, N/V/D. Both children have been sick lately. Still taking flonase and claritin. Tried delsym with minimal relief.   Relevant past medical, surgical, family and social history reviewed and updated as indicated. Interim medical history since our last visit reviewed. Allergies and medications reviewed and updated.  Review of Systems  Constitutional: Positive for chills.  HENT: Positive for congestion, sore throat and voice change.   Respiratory: Positive for cough and chest tightness.   Cardiovascular: Negative.   Gastrointestinal: Negative.   Musculoskeletal: Negative.   Neurological: Negative.   Psychiatric/Behavioral: Negative.    Per HPI unless specifically indicated above     Objective:    BP 136/88 (BP Location: Left Arm, Patient Position: Sitting, Cuff Size: Normal)   Pulse 65   Temp 98.1 F (36.7 C) (Oral)   Wt 182 lb 11.2 oz (82.9 kg)   SpO2 98%   BMI 27.78 kg/m   Wt Readings from Last 3 Encounters:  06/09/17 182 lb 11.2 oz (82.9 kg)  05/08/17 180 lb 4.8 oz (81.8 kg)  03/30/17 180 lb (81.6 kg)    Physical Exam  Constitutional: He is oriented to person, place, and time. He appears well-developed and well-nourished. No distress.  HENT:  Head: Atraumatic.  Nasal mucosa injected, rhinorrhea present Oropharynx injected  Eyes: Conjunctivae are normal. Pupils are equal, round, and reactive to  light.  Neck: Normal range of motion. Neck supple.  Pulmonary/Chest: Effort normal and breath sounds normal. No respiratory distress.  Neurological: He is alert and oriented to person, place, and time.  Skin: Skin is warm and dry. No rash noted.  Psychiatric: He has a normal mood and affect. His behavior is normal. Thought content normal.  Nursing note and vitals reviewed.     Assessment & Plan:   Problem List Items Addressed This Visit    None    Visit Diagnoses    Viral URI    -  Primary   Continue allergy regimen, add mucinex and tussionex prn. Discussed sedation precautions, space out with tramadol. F/u if worsening or no improvement       Follow up plan: Return for as scheduled.

## 2017-06-10 ENCOUNTER — Other Ambulatory Visit: Payer: Self-pay | Admitting: Family Medicine

## 2017-06-10 DIAGNOSIS — M79604 Pain in right leg: Principal | ICD-10-CM

## 2017-06-10 DIAGNOSIS — G8929 Other chronic pain: Secondary | ICD-10-CM

## 2017-06-11 ENCOUNTER — Encounter: Payer: Self-pay | Admitting: Family Medicine

## 2017-06-12 ENCOUNTER — Encounter: Payer: Self-pay | Admitting: Family Medicine

## 2017-06-12 ENCOUNTER — Other Ambulatory Visit: Payer: Self-pay | Admitting: Family Medicine

## 2017-06-12 MED ORDER — AMOXICILLIN-POT CLAVULANATE 875-125 MG PO TABS: 1.0000 | ORAL_TABLET | Freq: Two times a day (BID) | ORAL | 0 refills | Status: DC

## 2017-06-12 NOTE — Patient Instructions (Signed)
Follow up as needed

## 2017-06-26 ENCOUNTER — Ambulatory Visit: Payer: Medicare Other | Admitting: Family Medicine

## 2017-06-26 ENCOUNTER — Encounter: Payer: Self-pay | Admitting: Family Medicine

## 2017-06-26 VITALS — BP 134/87 | HR 68 | Temp 98.0°F | Wt 179.5 lb

## 2017-06-26 DIAGNOSIS — J069 Acute upper respiratory infection, unspecified: Secondary | ICD-10-CM | POA: Diagnosis not present

## 2017-06-26 MED ORDER — HYDROCOD POLST-CPM POLST ER 10-8 MG/5ML PO SUER: 5.0000 mL | Freq: Two times a day (BID) | ORAL | 0 refills | Status: DC | PRN

## 2017-06-26 MED ORDER — BENZONATATE 200 MG PO CAPS: 200.0000 mg | ORAL_CAPSULE | Freq: Two times a day (BID) | ORAL | 0 refills | Status: DC | PRN

## 2017-06-26 MED ORDER — DOXYCYCLINE HYCLATE 100 MG PO TABS: 100.0000 mg | ORAL_TABLET | Freq: Two times a day (BID) | ORAL | 0 refills | Status: DC

## 2017-06-26 NOTE — Progress Notes (Signed)
   BP 134/87 (BP Location: Left Arm, Patient Position: Sitting, Cuff Size: Normal)   Pulse 68   Temp 98 F (36.7 C) (Oral)   Wt 179 lb 8 oz (81.4 kg)   SpO2 99%   BMI 27.29 kg/m    Subjective:    Patient ID: Brandon Myers, male    DOB: 06/21/1966, 51 y.o.   MRN: [id number]  HPI: Brandon Myers is a 51 y.o. male  Chief Complaint  Patient presents with  . Cough    x's 10 days. Dry cough.   . Sinusitis  . Sore Throat  . Nasal Congestion   About 10 days of sore, swollen throat, malaise, congestion, dry hacking cough, hoarseness. Does not feel like it ever went fully away from recent URI despite tx. Has been trying mucinex, robitussin with no relief. Wife has been sick with similar sxs.   Relevant past medical, surgical, family and social history reviewed and updated as indicated. Interim medical history since our last visit reviewed. Allergies and medications reviewed and updated.  Review of Systems  Constitutional: Positive for fatigue.  HENT: Positive for congestion, sore throat and voice change.   Eyes: Negative.   Respiratory: Positive for cough and chest tightness.   Cardiovascular: Negative.   Gastrointestinal: Negative.   Genitourinary: Negative.   Musculoskeletal: Negative.   Neurological: Negative.   Psychiatric/Behavioral: Negative.     Per HPI unless specifically indicated above     Objective:    BP 134/87 (BP Location: Left Arm, Patient Position: Sitting, Cuff Size: Normal)   Pulse 68   Temp 98 F (36.7 C) (Oral)   Wt 179 lb 8 oz (81.4 kg)   SpO2 99%   BMI 27.29 kg/m   Wt Readings from Last 3 Encounters:  06/26/17 179 lb 8 oz (81.4 kg)  06/09/17 182 lb 11.2 oz (82.9 kg)  05/08/17 180 lb 4.8 oz (81.8 kg)    Physical Exam  Constitutional: He is oriented to person, place, and time. He appears well-developed and well-nourished. No distress.  HENT:  Head: Atraumatic.  Right Ear: External ear normal.  Left Ear: External ear normal.  Nasal mucosa  injected with discharge present Oropharynx erythematous with left tonsillar edema. No exudates noted  Eyes: Conjunctivae are normal. Pupils are equal, round, and reactive to light.  Neck: Normal range of motion. Neck supple.  Cardiovascular: Normal rate, regular rhythm and normal heart sounds.  Pulmonary/Chest: Effort normal and breath sounds normal. No respiratory distress. He has no wheezes. He has no rales.  Musculoskeletal: Normal range of motion.  Neurological: He is alert and oriented to person, place, and time.  Skin: Skin is warm and dry.  Psychiatric: He has a normal mood and affect. His behavior is normal.  Nursing note and vitals reviewed.     Assessment & Plan:   Problem List Items Addressed This Visit    None    Visit Diagnoses    Upper respiratory tract infection, unspecified type    -  Primary   No improvement with recent course of augmentin. Will try doxy, tessalon, tussionex. Continue OTC remedies prn, supportive care reviewed.     Precautions reviewed regarding tussionex, particularly use alongside tramadol. Pt will space the medications out at least 4 hours for safety.    Follow up plan: Return for as scheduled.

## 2017-06-26 NOTE — Patient Instructions (Signed)
Follow up as needed

## 2017-07-17 ENCOUNTER — Other Ambulatory Visit: Payer: Self-pay | Admitting: Family Medicine

## 2017-07-17 DIAGNOSIS — M79604 Pain in right leg: Principal | ICD-10-CM

## 2017-07-17 DIAGNOSIS — G8929 Other chronic pain: Secondary | ICD-10-CM

## 2017-07-18 ENCOUNTER — Encounter: Payer: Self-pay | Admitting: Family Medicine

## 2017-08-09 ENCOUNTER — Ambulatory Visit (INDEPENDENT_AMBULATORY_CARE_PROVIDER_SITE_OTHER): Payer: Medicare Other | Admitting: Family Medicine

## 2017-08-09 DIAGNOSIS — G546 Phantom limb syndrome with pain: Secondary | ICD-10-CM

## 2017-08-09 DIAGNOSIS — K219 Gastro-esophageal reflux disease without esophagitis: Secondary | ICD-10-CM | POA: Diagnosis not present

## 2017-08-09 MED ORDER — OMEPRAZOLE 40 MG PO CPDR: 40.0000 mg | DELAYED_RELEASE_CAPSULE | Freq: Every day | ORAL | 3 refills | Status: DC

## 2017-08-09 MED ORDER — GABAPENTIN 300 MG PO CAPS: ORAL_CAPSULE | ORAL | 1 refills | Status: DC

## 2017-08-09 NOTE — Progress Notes (Signed)
BP (!) 138/91 (BP Location: Left Arm, Patient Position: Sitting, Cuff Size: Normal)   Pulse 61   Temp 98.1 F (36.7 C) (Oral)   Wt 181 lb 1.6 oz (82.1 kg)   SpO2 98%   BMI 27.54 kg/m    Subjective:    Patient ID: Brandon GrapesKevin L Cobos, male    DOB: 02/29/1966, 52 y.o.   MRN: [id number]  HPI: Brandon Myers is a 52 y.o. male  Chief Complaint  Patient presents with  . Follow-up    Patient states no complaints  . Medication Refill   Patient presents to f/u on chronic pain from past amputations, currently controlled adequately with BID tramadol. No concerns or changes, side effects with medication.   Also needing refills on his prilosec and gabapentin today.   Relevant past medical, surgical, family and social history reviewed and updated as indicated. Interim medical history since our last visit reviewed. Allergies and medications reviewed and updated.  Review of Systems  Constitutional: Negative.   Respiratory: Negative.   Cardiovascular: Negative.   Gastrointestinal: Negative.   Musculoskeletal: Positive for arthralgias.  Neurological: Negative.   Psychiatric/Behavioral: Negative.    Per HPI unless specifically indicated above     Objective:    BP (!) 138/91 (BP Location: Left Arm, Patient Position: Sitting, Cuff Size: Normal)   Pulse 61   Temp 98.1 F (36.7 C) (Oral)   Wt 181 lb 1.6 oz (82.1 kg)   SpO2 98%   BMI 27.54 kg/m   Wt Readings from Last 3 Encounters:  08/09/17 181 lb 1.6 oz (82.1 kg)  06/26/17 179 lb 8 oz (81.4 kg)  06/09/17 182 lb 11.2 oz (82.9 kg)    Physical Exam  Constitutional: He is oriented to person, place, and time. He appears well-developed and well-nourished. No distress.  HENT:  Head: Atraumatic.  Eyes: Conjunctivae are normal. Pupils are equal, round, and reactive to light. No scleral icterus.  Neck: Normal range of motion. Neck supple.  Cardiovascular: Normal rate and normal heart sounds.  Pulmonary/Chest: Effort normal and breath sounds  normal. No respiratory distress.  Musculoskeletal:  S/p Right UE/LE amputations  Neurological: He is alert and oriented to person, place, and time.  Skin: Skin is warm and dry.  Psychiatric: He has a normal mood and affect. His behavior is normal.  Nursing note and vitals reviewed.   Results for orders placed or performed in visit on 03/30/17  Urine drugs of abuse scrn w alc, routine (Ref Lab)  Result Value Ref Range   Amphetamines, Urine Negative Cutoff=1000 ng/mL   Barbiturate Quant, Ur Negative Cutoff=300 ng/mL   Benzodiazepine Quant, Ur Negative Cutoff=300 ng/mL   Cannabinoid Quant, Ur Negative Cutoff=50 ng/mL   Cocaine (Metab.) Negative Cutoff=300 ng/mL   Opiate Quant, Ur Negative Cutoff=300 ng/mL   PCP Quant, Ur Negative Cutoff=25 ng/mL   Methadone Screen, Urine Negative Cutoff=300 ng/mL   Propoxyphene Negative Cutoff=300 ng/mL   Ethanol, Urine Negative Cutoff=0.020 %      Assessment & Plan:   Problem List Items Addressed This Visit      Digestive   Esophageal reflux    Stable on prilosec, continue current regimen      Relevant Medications   omeprazole (PRILOSEC) 40 MG capsule     Nervous and Auditory   Phantom limb pain (HCC)    Controlled fairly well with tramadol and gabapentin. Continue current regimen      Relevant Medications   gabapentin (NEURONTIN) 300 MG capsule  Follow up plan: Return in about 3 months (around 11/07/2017) for Pain f/u.

## 2017-08-12 NOTE — Assessment & Plan Note (Signed)
Controlled fairly well with tramadol and gabapentin. Continue current regimen

## 2017-08-12 NOTE — Patient Instructions (Signed)
Follow up in 3 months

## 2017-08-12 NOTE — Assessment & Plan Note (Signed)
Stable on prilosec, continue current regimen 

## 2017-08-24 ENCOUNTER — Telehealth: Payer: Self-pay | Admitting: Family Medicine

## 2017-08-24 DIAGNOSIS — M79604 Pain in right leg: Principal | ICD-10-CM

## 2017-08-24 DIAGNOSIS — G8929 Other chronic pain: Secondary | ICD-10-CM

## 2017-08-24 MED ORDER — TRAMADOL HCL 50 MG PO TABS: ORAL_TABLET | ORAL | 0 refills | Status: DC

## 2017-08-24 NOTE — Telephone Encounter (Signed)
Patient needs a refill on his Tramadol 50mg  [facility] [place]  Thank you  If any questions call Balen at [phone]

## 2017-08-24 NOTE — Telephone Encounter (Signed)
RX printed and ready to fax

## 2017-08-25 ENCOUNTER — Encounter: Payer: Self-pay | Admitting: Family Medicine

## 2017-08-30 ENCOUNTER — Other Ambulatory Visit: Payer: Self-pay | Admitting: Family Medicine

## 2017-08-30 DIAGNOSIS — J069 Acute upper respiratory infection, unspecified: Secondary | ICD-10-CM

## 2017-09-18 ENCOUNTER — Other Ambulatory Visit: Payer: Self-pay | Admitting: Family Medicine

## 2017-09-18 ENCOUNTER — Encounter: Payer: Self-pay | Admitting: Family Medicine

## 2017-09-18 DIAGNOSIS — K219 Gastro-esophageal reflux disease without esophagitis: Secondary | ICD-10-CM

## 2017-09-18 DIAGNOSIS — G546 Phantom limb syndrome with pain: Secondary | ICD-10-CM

## 2017-09-18 DIAGNOSIS — J069 Acute upper respiratory infection, unspecified: Secondary | ICD-10-CM

## 2017-09-18 MED ORDER — GABAPENTIN 300 MG PO CAPS
ORAL_CAPSULE | ORAL | 1 refills | Status: DC
Start: 2017-09-18 — End: 2018-01-09

## 2017-09-18 MED ORDER — LORATADINE 10 MG PO TABS: 10.0000 mg | ORAL_TABLET | Freq: Every day | ORAL | 11 refills | Status: DC

## 2017-09-18 MED ORDER — OMEPRAZOLE 40 MG PO CPDR: 40.0000 mg | DELAYED_RELEASE_CAPSULE | Freq: Every day | ORAL | 3 refills | Status: DC

## 2017-09-25 ENCOUNTER — Ambulatory Visit: Payer: Medicare Other | Admitting: Family Medicine

## 2017-09-25 ENCOUNTER — Encounter: Payer: Self-pay | Admitting: Family Medicine

## 2017-09-25 VITALS — BP 115/75 | HR 70 | Temp 98.2°F | Wt 180.3 lb

## 2017-09-25 DIAGNOSIS — H1131 Conjunctival hemorrhage, right eye: Secondary | ICD-10-CM | POA: Diagnosis not present

## 2017-09-25 DIAGNOSIS — G8929 Other chronic pain: Secondary | ICD-10-CM | POA: Diagnosis not present

## 2017-09-25 DIAGNOSIS — M79604 Pain in right leg: Secondary | ICD-10-CM | POA: Diagnosis not present

## 2017-09-25 MED ORDER — TRAMADOL HCL 50 MG PO TABS: ORAL_TABLET | ORAL | 0 refills | Status: DC

## 2017-09-25 MED ORDER — ERYTHROMYCIN 5 MG/GM OP OINT: 1.0000 "application " | TOPICAL_OINTMENT | Freq: Two times a day (BID) | OPHTHALMIC | 0 refills | Status: DC

## 2017-09-25 NOTE — Progress Notes (Signed)
BP 115/75 (BP Location: Right Arm, Patient Position: Sitting, Cuff Size: Normal)   Pulse 70   Temp 98.2 F (36.8 C) (Tympanic)   Wt 180 lb 4.8 oz (81.8 kg)   SpO2 95%   BMI 27.41 kg/m    Subjective:    Patient ID: Brandon Myers, male    DOB: 03/10/1966, 52 y.o.   MRN: [id number]  HPI: Brandon Myers is a 52 y.o. male  Chief Complaint  Patient presents with  . Eye Problem    Right. Severely red eye, draining, itching. x's 2 days.  . Medication Refill    Tramadol   Pt here for lateral right eye redness and irritation x 2 days. Minimal discharge or itching, just feels a bit swollen. Denies recent injuries or foreign objects entering eye, visual disturbance, headache, N/V, fevers, rashes around eye. Wife recently with conjunctivitis. Has only been using visine so far, minimal relief with that.   Also wanting refills on his tramadol for his lower extremity/phantom limb pains. Doing very well on it, taking it twice daily with no side effects.   Relevant past medical, surgical, family and social history reviewed and updated as indicated. Interim medical history since our last visit reviewed. Allergies and medications reviewed and updated.  Review of Systems  Per HPI unless specifically indicated above     Objective:    BP 115/75 (BP Location: Right Arm, Patient Position: Sitting, Cuff Size: Normal)   Pulse 70   Temp 98.2 F (36.8 C) (Tympanic)   Wt 180 lb 4.8 oz (81.8 kg)   SpO2 95%   BMI 27.41 kg/m   Wt Readings from Last 3 Encounters:  09/25/17 180 lb 4.8 oz (81.8 kg)  08/09/17 181 lb 1.6 oz (82.1 kg)  06/26/17 179 lb 8 oz (81.4 kg)    Physical Exam  Constitutional: He is oriented to person, place, and time. He appears well-developed and well-nourished. No distress.  HENT:  Head: Atraumatic.  Right Ear: External ear normal.  Left Ear: External ear normal.  Nose: Nose normal.  Mouth/Throat: Oropharynx is clear and moist. No oropharyngeal exudate.  Eyes: Pupils are  equal, round, and reactive to light. Right eye exhibits no discharge. Left eye exhibits no discharge.  Right subconjunctival hemorrhage present. No visible foreign body or corneal abrasions  Neck: Normal range of motion. Neck supple.  Cardiovascular: Normal rate and normal heart sounds.  Pulmonary/Chest: Effort normal and breath sounds normal. No respiratory distress.  Musculoskeletal:  ROM at baseline  Neurological: He is alert and oriented to person, place, and time.  Skin: Skin is warm and dry.  Psychiatric: He has a normal mood and affect. His behavior is normal.  Nursing note and vitals reviewed.   Visual Acuity with correction: R - 20/40 L - 20/30 Both - 20/30  Results for orders placed or performed in visit on 03/30/17  Urine drugs of abuse scrn w alc, routine (Ref Lab)  Result Value Ref Range   Amphetamines, Urine Negative Cutoff=1000 ng/mL   Barbiturate Quant, Ur Negative Cutoff=300 ng/mL   Benzodiazepine Quant, Ur Negative Cutoff=300 ng/mL   Cannabinoid Quant, Ur Negative Cutoff=50 ng/mL   Cocaine (Metab.) Negative Cutoff=300 ng/mL   Opiate Quant, Ur Negative Cutoff=300 ng/mL   PCP Quant, Ur Negative Cutoff=25 ng/mL   Methadone Screen, Urine Negative Cutoff=300 ng/mL   Propoxyphene Negative Cutoff=300 ng/mL   Ethanol, Urine Negative Cutoff=0.020 %      Assessment & Plan:   Problem List Items Addressed This  Visit      Other   Chronic pain of right lower extremity    Stable and well controlled with tramadol BID and gabapentin. Continue current regimen      Relevant Medications   traMADol (ULTRAM) 50 MG tablet    Other Visit Diagnoses    Subconjunctival hematoma, right    -  Primary   Exam reassuring, will tx with erythromycin ointment for comfort and infection prevention. Warm compresses, continue to monitor. Return precautions given       Follow up plan: Return for as scheduled.

## 2017-09-26 NOTE — Patient Instructions (Signed)
Follow up as scheduled.  

## 2017-09-26 NOTE — Assessment & Plan Note (Signed)
Stable and well controlled with tramadol BID and gabapentin. Continue current regimen

## 2017-09-27 NOTE — Telephone Encounter (Signed)
Will generate placard and have it ready for pick up  Copied from CRM [phone]. Topic: Inquiry >> Sep 27, 2017  1:13 PM Eston Mouldavis, Cheri B wrote: Reason for CRM:Pt wants to know if  Roosvelt MaserRachel Lane would be able to sign a handicap placard for him today

## 2017-10-23 ENCOUNTER — Other Ambulatory Visit: Payer: Self-pay | Admitting: Family Medicine

## 2017-10-23 DIAGNOSIS — G8929 Other chronic pain: Secondary | ICD-10-CM

## 2017-10-23 DIAGNOSIS — M79604 Pain in right leg: Principal | ICD-10-CM

## 2017-11-08 ENCOUNTER — Ambulatory Visit: Payer: Medicare Other | Admitting: Family Medicine

## 2017-11-08 ENCOUNTER — Encounter: Payer: Self-pay | Admitting: Family Medicine

## 2017-11-08 VITALS — BP 150/84 | HR 68 | Temp 98.3°F | Ht 67.6 in | Wt 183.8 lb

## 2017-11-08 DIAGNOSIS — M778 Other enthesopathies, not elsewhere classified: Secondary | ICD-10-CM | POA: Diagnosis not present

## 2017-11-08 DIAGNOSIS — G546 Phantom limb syndrome with pain: Secondary | ICD-10-CM

## 2017-11-08 DIAGNOSIS — G8929 Other chronic pain: Secondary | ICD-10-CM

## 2017-11-08 DIAGNOSIS — M79604 Pain in right leg: Secondary | ICD-10-CM | POA: Diagnosis not present

## 2017-11-08 MED ORDER — HYDROCODONE-ACETAMINOPHEN 10-325 MG PO TABS: 1.0000 | ORAL_TABLET | Freq: Three times a day (TID) | ORAL | 0 refills | Status: DC | PRN

## 2017-11-08 MED ORDER — TRAMADOL HCL 50 MG PO TABS: ORAL_TABLET | ORAL | 0 refills | Status: DC

## 2017-11-08 NOTE — Progress Notes (Signed)
BP (!) 150/84 (BP Location: Left Arm, Cuff Size: Normal)   Pulse 68   Temp 98.3 F (36.8 C) (Oral)   Ht 5' 7.6" (1.717 m)   Wt 183 lb 12.8 oz (83.4 kg)   SpO2 98%   BMI 28.28 kg/m    Subjective:    Patient ID: Brandon Myers, male    DOB: 1966-06-03, 52 y.o.   MRN: [id number]  HPI: Brandon Myers is a 52 y.o. male  Chief Complaint  Patient presents with  . Pain    3 month f/up- pt states he feels like the tramadol is not working anymore   Patient here today for 3 month f/u on his chronic pain s/p multiple amputations. Has been under good control historically with BID tramadol, but lately has been having severe short episodes of exacerbations especially in the right upper leg above amputation site from the nerve damage. Has been dealing with a flare x 2 days and barely able to sleep or get around. Continues to take max doses of gabapentin daily. Denies fevers, site wounds or erythema, side effects from the medicine.   Also having a flare of left elbow and wrist pain with some numbness down into hand. Has not been trying anything OTC for issue.   Relevant past medical, surgical, family and social history reviewed and updated as indicated. Interim medical history since our last visit reviewed. Allergies and medications reviewed and updated.  Review of Systems  Per HPI unless specifically indicated above     Objective:    BP (!) 150/84 (BP Location: Left Arm, Cuff Size: Normal)   Pulse 68   Temp 98.3 F (36.8 C) (Oral)   Ht 5' 7.6" (1.717 m)   Wt 183 lb 12.8 oz (83.4 kg)   SpO2 98%   BMI 28.28 kg/m   Wt Readings from Last 3 Encounters:  11/08/17 183 lb 12.8 oz (83.4 kg)  09/25/17 180 lb 4.8 oz (81.8 kg)  08/09/17 181 lb 1.6 oz (82.1 kg)    Physical Exam  Constitutional: He is oriented to person, place, and time. He appears well-developed and well-nourished. No distress.  HENT:  Head: Atraumatic.  Eyes: Conjunctivae are normal.  Neck: Normal range of motion. Neck  supple.  Cardiovascular: Normal rate and intact distal pulses.  Pulmonary/Chest: Effort normal and breath sounds normal.  Musculoskeletal:  S/p right UE/LE amputations   Neurological: He is alert and oriented to person, place, and time.  Skin: Skin is warm and dry.  Psychiatric: He has a normal mood and affect. His behavior is normal.  Nursing note and vitals reviewed.   Results for orders placed or performed in visit on 03/30/17  Urine drugs of abuse scrn w alc, routine (Ref Lab)  Result Value Ref Range   Amphetamines, Urine Negative Cutoff=1000 ng/mL   Barbiturate Quant, Ur Negative Cutoff=300 ng/mL   Benzodiazepine Quant, Ur Negative Cutoff=300 ng/mL   Cannabinoid Quant, Ur Negative Cutoff=50 ng/mL   Cocaine (Metab.) Negative Cutoff=300 ng/mL   Opiate Quant, Ur Negative Cutoff=300 ng/mL   PCP Quant, Ur Negative Cutoff=25 ng/mL   Methadone Screen, Urine Negative Cutoff=300 ng/mL   Propoxyphene Negative Cutoff=300 ng/mL   Ethanol, Urine Negative Cutoff=0.020 %      Assessment & Plan:   Problem List Items Addressed This Visit      Nervous and Auditory   Phantom limb pain (HCC)     Musculoskeletal and Integument   Tendonitis of wrist, left    Acute on  chronic left wrist and elbow pain. 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 Discussed NSAIDs, stretches, exercises, braces. Consider PT if no improvement.         Other   Chronic pain of right lower extremity - Primary    Continue tramadol BID prn, will add small supply of hydrocodone #15 for these severe short flares that pt is aware that this must last at least 3 months. If needing more than that, pt will have to establish with the pain clinic. Precautions reviewed at length      Relevant Medications   traMADol (ULTRAM) 50 MG tablet   HYDROcodone-acetaminophen (NORCO) 10-325 MG tablet       Follow up plan: Return in about 3 months (around 02/07/2018) for CPE, AWV 02/23/18.

## 2017-11-08 NOTE — Patient Instructions (Addendum)
Follow up in 3 months  [facility] Ask your health care provider which exercises are safe for you. Do exercises exactly as told by your health care provider and adjust them as directed. It is normal to feel mild stretching, pulling, tightness, or discomfort as you do these exercises, but you should stop right away if you feel sudden pain or your pain gets worse. Do not begin these exercises until told by your health care provider. Stretching and range of motion exercises These exercises warm up your muscles and joints and improve the movement and flexibility of your elbow. These exercises also help to relieve pain, numbness, and tingling. Exercise A: Wrist extensor stretch 1. Extend your left / right elbow with your fingers pointing down. 2. Gently pull the palm of your left / right hand toward you until you feel a gentle stretch on the top of your forearm. 3. To increase the stretch, push your left / right hand toward the outer edge or pinkie side of your forearm. 4. Hold this position for __________ seconds. Repeat __________ times. Complete this exercise __________ times a day. If directed by your health care provider, repeat this stretch except do it with a bent elbow this time. Exercise B: Wrist flexor stretch  1. Extend your left / right elbow and turn your palm upward. 2. Gently pull your left / right palm and fingertips back so your wrist extends and your fingers point more toward the ground. 3. You should feel a gentle stretch on the inside of your forearm. 4. Hold this position for __________ seconds. Repeat __________ times. Complete this exercise __________ times a day. If directed by your health care provider, repeat this stretch except do it with a bent elbow this time. Strengthening exercises These exercises build strength and endurance in your elbow. Endurance is the ability to use your muscles for a long time, even after they get tired. Exercise C: Wrist  extensors  1. Sit with your left / right forearm palm-down and fully supported on a table or countertop. Your elbow should be resting below the height of your shoulder. 2. Let your left / right wrist extend over the edge of the surface. 3. Loosely hold a __________ weight or a piece of rubber exercise band or tubing in your left / right hand. Slowly curl your left / right hand up toward your forearm. If you are using band or tubing, hold the band or tubing in place with your other hand to provide resistance. 4. Hold this position for __________ seconds. 5. Slowly return to the starting position. Repeat __________ times. Complete this exercise __________ times a day. Exercise D: Radial deviators  1. Stand with a __________ weight in your left / righthand. Or, sit while holding a rubber exercise band or tubing with your other arm supported on a table or countertop. Position your hand so your thumb is on top. 2. Raise your hand upward in front of you so your thumb travels toward your forearm, or pull up on the rubber tubing. 3. Hold this position for __________ seconds. 4. Slowly return to the starting position. Repeat __________ times. Complete this exercise __________ times a day. Exercise E: Eccentric wrist extensors 1. Sit with your left / right forearm palm-down and fully supported on a table or countertop. Your elbow should be resting below the height of your shoulder. 2. If told by your health care provider, hold a __________ weight in your hand. 3. Let your left / right  wrist extend over the edge of the surface. 4. Use your other hand to lift up your left / right hand toward your forearm. Keep your forearm on the table. 5. Using only the muscles in your left / right hand, slowly lower your hand back down to the starting position. Repeat __________ times. Complete this exercise __________ times a day. This information is not intended to replace advice given to you by your health care  provider. Make sure you discuss any questions you have with your health care provider. Document Released: 07/18/2005 Document Revised: 03/23/2016 Document Reviewed: 04/16/2015 Elsevier Interactive Patient Education  [employer]2018 [employer]

## 2017-11-11 NOTE — Assessment & Plan Note (Signed)
Continue tramadol BID prn, will add small supply of hydrocodone #15 for these severe short flares that pt is aware that this must last at least 3 months. If needing more than that, pt will have to establish with the pain clinic. Precautions reviewed at length

## 2017-11-11 NOTE — Assessment & Plan Note (Signed)
Acute on chronic left wrist and elbow pain. 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 Discussed NSAIDs, stretches, exercises, braces. Consider PT if no improvement.

## 2017-11-21 ENCOUNTER — Other Ambulatory Visit: Payer: Self-pay | Admitting: Family Medicine

## 2017-12-04 ENCOUNTER — Telehealth: Payer: Self-pay | Admitting: Family Medicine

## 2017-12-04 DIAGNOSIS — G894 Chronic pain syndrome: Secondary | ICD-10-CM

## 2017-12-04 NOTE — Telephone Encounter (Signed)
Copied from CRM [id number]. Topic: Referral - Request >> Dec 04, 2017 12:49 PM Oneal Grout wrote: Reason for CRM: Requesting referral for Pain management for chronic pain

## 2017-12-04 NOTE — Telephone Encounter (Signed)
Referral generated

## 2017-12-13 ENCOUNTER — Telehealth: Payer: Self-pay | Admitting: Family Medicine

## 2017-12-13 NOTE — Telephone Encounter (Signed)
Copied from CRM [id number]. Topic: Quick Communication - Rx Refill/Question >> Dec 13, 2017  8:57 AM Leafy Ro wrote: Medication: hydrocodone Has the patient contacted their pharmacy?no (Agent: If no, request that the patient contact the pharmacy for the refill.) Preferred Pharmacy (with phone number or street name): [facility]  [address] [phone] . The tramadol does not work for the patient per wife. Pt has an appt with pain management clinic on 01-18-18

## 2017-12-13 NOTE — Telephone Encounter (Signed)
Patient needs an appointment for medication per Dr. Laural Benes. Thanks.

## 2017-12-13 NOTE — Telephone Encounter (Signed)
Patient will be here tomorrow at 1:45  Thank you

## 2017-12-13 NOTE — Telephone Encounter (Signed)
Dr. Laural Benes, Fleet Contras has been giving patient Tramadol for his pain. Given 11/08/17 5 days supply of Hydrocodone.   Requesting refill. Appt with Pain Management 01/08/2018. Does patient need an appointment for this?

## 2017-12-13 NOTE — Telephone Encounter (Signed)
Yes please

## 2017-12-13 NOTE — Telephone Encounter (Signed)
I can see him at 1:45 tomorrow.

## 2017-12-13 NOTE — Telephone Encounter (Signed)
Spoke with patients wife she states he cannot wait for an appointment to get this med per patients wife he is suffering really bad and needs something to help him.  There are no openings please advise. Thank you

## 2017-12-14 ENCOUNTER — Encounter: Payer: Self-pay | Admitting: Family Medicine

## 2017-12-14 ENCOUNTER — Ambulatory Visit (INDEPENDENT_AMBULATORY_CARE_PROVIDER_SITE_OTHER): Payer: Medicare Other | Admitting: Family Medicine

## 2017-12-14 VITALS — BP 129/85 | HR 65 | Temp 98.0°F | Wt 181.4 lb

## 2017-12-14 DIAGNOSIS — G894 Chronic pain syndrome: Secondary | ICD-10-CM | POA: Diagnosis not present

## 2017-12-14 DIAGNOSIS — G546 Phantom limb syndrome with pain: Secondary | ICD-10-CM

## 2017-12-14 MED ORDER — NORTRIPTYLINE HCL 25 MG PO CAPS: 25.0000 mg | ORAL_CAPSULE | Freq: Every day | ORAL | 3 refills | Status: DC

## 2017-12-14 MED ORDER — HYDROCODONE-ACETAMINOPHEN 10-325 MG PO TABS: 1.0000 | ORAL_TABLET | Freq: Two times a day (BID) | ORAL | 0 refills | Status: DC | PRN

## 2017-12-14 NOTE — Assessment & Plan Note (Signed)
Not doing well on current regimen. No benefit from tramadol. Will stop it. Will give him 1 hydrocodone at night with 15 extra for the month- this should last at least 1 month as he only takes it before bed. Appointment is scheduled with pain management. Will need to be bridged until they decide if they are taking over, at least another month. Rx given today. Nortriptyline added to help with nerve pain. Recheck 1 month. Call with any concerns.  

## 2017-12-14 NOTE — Assessment & Plan Note (Signed)
Not doing well on current regimen. No benefit from tramadol. Will stop it. Will give him 1 hydrocodone at night with 15 extra for the month- this should last at least 1 month as he only takes it before bed. Appointment is scheduled with pain management. Will need to be bridged until they decide if they are taking over, at least another month. Rx given today. Nortriptyline added to help with nerve pain. Recheck 1 month. Call with any concerns.

## 2017-12-14 NOTE — Progress Notes (Signed)
BP 129/85 (BP Location: Left Arm, Patient Position: Sitting, Cuff Size: Normal)   Pulse 65   Temp 98 F (36.7 C)   Wt 181 lb 7 oz (82.3 kg)   SpO2 97%   BMI 27.91 kg/m    Subjective:    Patient ID: Brandon Myers, male    DOB: 1966/04/21, 52 y.o.   MRN: [id number]  HPI: Brandon Myers is a 52 y.o. male  Chief Complaint  Patient presents with  . Pain   CHRONIC PAIN- has appointment with Dr. Cherylann Ratel for 01/18/18- At his last appointment with PCP, he was given a short supply of hydrocodone (15 pills) He was instructed at that time that that needed to last 3 months. He is aware that he was told that he needed to have it last 3 months, but he was taking it. Had been taking 1 pill a day after he got the medicine and has been off of the hydrocodone. He doesn't feel like the tramadol has done anything for him. He feels like he is in a little bit of a flare today, but not a big one.   Has only been taking his tramadol at night  Present dose:  40 Morphine equivalents Pain control status: uncontrolled Duration: chronic Location: Phantom Limb pain in his legs and arm Quality: stabbing Current Pain Level: 5/10 Previous Pain Level: unable to give a number Breakthrough pain: yes Benefit from narcotic medications: yes What Activities task can be accomplished with current medication?: able to do ADLs, able to sleep Interested in weaning off narcotics:no   Stool softners/OTC fiber: no  Previous pain specialty evaluation: no Non-narcotic analgesic meds: yes- max dose gabapentin Narcotic contract: yes   Relevant past medical, surgical, family and social history reviewed and updated as indicated. Interim medical history since our last visit reviewed. Allergies and medications reviewed and updated.  Review of Systems  Per HPI unless specifically indicated above     Objective:    BP 129/85 (BP Location: Left Arm, Patient Position: Sitting, Cuff Size: Normal)   Pulse 65   Temp 98 F (36.7 C)    Wt 181 lb 7 oz (82.3 kg)   SpO2 97%   BMI 27.91 kg/m   Wt Readings from Last 3 Encounters:  12/14/17 181 lb 7 oz (82.3 kg)  11/08/17 183 lb 12.8 oz (83.4 kg)  09/25/17 180 lb 4.8 oz (81.8 kg)    Physical Exam  Constitutional: He is oriented to person, place, and time. He appears well-developed and well-nourished. No distress.  HENT:  Head: Normocephalic and atraumatic.  Right Ear: Hearing normal.  Left Ear: Hearing normal.  Nose: Nose normal.  Eyes: Conjunctivae and lids are normal. Right eye exhibits no discharge. Left eye exhibits no discharge. No scleral icterus.  Cardiovascular: Normal rate, regular rhythm, normal heart sounds and intact distal pulses. Exam reveals no gallop and no friction rub.  No murmur heard. Pulmonary/Chest: Effort normal and breath sounds normal. No stridor. No respiratory distress. He has no wheezes. He has no rales. He exhibits no tenderness.  Musculoskeletal: Normal range of motion.  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  Skin: Skin is warm, dry and intact. Capillary refill takes less than 2 seconds. No rash noted. He is not diaphoretic. No erythema. No pallor.  Psychiatric: He has a normal mood and affect. His speech is normal and behavior is normal. Judgment  and thought content normal. Cognition and memory are normal.    Results for orders placed or performed in visit on 03/30/17  Urine drugs of abuse scrn w alc, routine (Ref Lab)  Result Value Ref Range   Amphetamines, Urine Negative Cutoff=1000 ng/mL   Barbiturate Quant, Ur Negative Cutoff=300 ng/mL   Benzodiazepine Quant, Ur Negative Cutoff=300 ng/mL   Cannabinoid Quant, Ur Negative Cutoff=50 ng/mL   Cocaine (Metab.) Negative Cutoff=300 ng/mL   Opiate Quant, Ur Negative Cutoff=300 ng/mL   PCP Quant, Ur Negative Cutoff=25 ng/mL   Methadone Screen, Urine Negative Cutoff=300 ng/mL   Propoxyphene Negative Cutoff=300  ng/mL   Ethanol, Urine Negative Cutoff=0.020 %      Assessment & Plan:   Problem List Items Addressed This Visit      Nervous and Auditory   Phantom limb pain (HCC)    Not doing well on current regimen. No benefit from tramadol. Will stop it. Will give him 1 hydrocodone at night with 15 extra for the month- this should last at least 1 month as he only takes it before bed. Appointment is scheduled with pain management. Will need to be bridged until they decide if they are taking over, at least another month. Rx given today. Nortriptyline added to help with nerve pain. Recheck 1 month. Call with any concerns.       Relevant Medications   nortriptyline (PAMELOR) 25 MG capsule     Other   Chronic pain syndrome - Primary    Not doing well on current regimen. No benefit from tramadol. Will stop it. Will give him 1 hydrocodone at night with 15 extra for the month- this should last at least 1 month as he only takes it before bed. Appointment is scheduled with pain management. Will need to be bridged until they decide if they are taking over, at least another month. Rx given today. Nortriptyline added to help with nerve pain. Recheck 1 month. Call with any concerns.           Follow up plan: Return in about 1 month (around 01/11/2018) for follow up chronic pain.

## 2017-12-17 ENCOUNTER — Other Ambulatory Visit: Payer: Self-pay | Admitting: Family Medicine

## 2017-12-17 DIAGNOSIS — I10 Essential (primary) hypertension: Secondary | ICD-10-CM

## 2017-12-18 ENCOUNTER — Telehealth: Payer: Self-pay | Admitting: Family Medicine

## 2017-12-18 NOTE — Telephone Encounter (Signed)
Please advise 

## 2017-12-19 MED ORDER — NORTRIPTYLINE HCL 10 MG PO CAPS: 10.0000 mg | ORAL_CAPSULE | Freq: Every day | ORAL | 3 refills | Status: DC

## 2017-12-19 NOTE — Telephone Encounter (Signed)
Not sure why this is a note- can we please check on this.

## 2017-12-19 NOTE — Telephone Encounter (Signed)
Sorry I thought the message was included when I sent this the first time.    Pt's spouse called in to make provider aware that nortriptyline (PAMELOR) 25 MG capsule is causing pt to be sleepy during the day also. She would like to be advised on what they should do because he is unable to function during the day due to medication.

## 2017-12-19 NOTE — Telephone Encounter (Signed)
They are capsules. Please send in . And I'm not sure what you mean by a note. I did not open one.

## 2017-12-19 NOTE — Telephone Encounter (Signed)
If it's a tablet, he can cut it in 1/2, if it's a capsule, I'll send him through the 

## 2017-12-19 NOTE — Addendum Note (Signed)
Addended by: Dorcas Carrow on: 12/19/2017 02:11 PM   Modules accepted: Orders

## 2017-12-25 ENCOUNTER — Encounter: Payer: Self-pay | Admitting: Family Medicine

## 2017-12-25 DIAGNOSIS — I10 Essential (primary) hypertension: Secondary | ICD-10-CM

## 2017-12-26 MED ORDER — LOSARTAN POTASSIUM 100 MG PO TABS: 100.0000 mg | ORAL_TABLET | Freq: Every day | ORAL | 1 refills | Status: DC

## 2018-01-09 ENCOUNTER — Ambulatory Visit (INDEPENDENT_AMBULATORY_CARE_PROVIDER_SITE_OTHER): Payer: Medicare Other | Admitting: Family Medicine

## 2018-01-09 ENCOUNTER — Encounter: Payer: Self-pay | Admitting: Family Medicine

## 2018-01-09 VITALS — BP 139/90 | HR 81 | Temp 98.6°F | Wt 184.2 lb

## 2018-01-09 DIAGNOSIS — I10 Essential (primary) hypertension: Secondary | ICD-10-CM

## 2018-01-09 DIAGNOSIS — E782 Mixed hyperlipidemia: Secondary | ICD-10-CM

## 2018-01-09 DIAGNOSIS — G894 Chronic pain syndrome: Secondary | ICD-10-CM | POA: Diagnosis not present

## 2018-01-09 DIAGNOSIS — K219 Gastro-esophageal reflux disease without esophagitis: Secondary | ICD-10-CM | POA: Diagnosis not present

## 2018-01-09 DIAGNOSIS — D700 Congenital agranulocytosis: Secondary | ICD-10-CM | POA: Diagnosis not present

## 2018-01-09 DIAGNOSIS — G546 Phantom limb syndrome with pain: Secondary | ICD-10-CM | POA: Diagnosis not present

## 2018-01-09 DIAGNOSIS — J301 Allergic rhinitis due to pollen: Secondary | ICD-10-CM | POA: Diagnosis not present

## 2018-01-09 LAB — UA/M W/RFLX CULTURE, ROUTINE
BILIRUBIN UA: NEGATIVE
Glucose, UA: NEGATIVE
Ketones, UA: NEGATIVE
Leukocytes, UA: NEGATIVE
NITRITE UA: NEGATIVE
PH UA: 7 (ref 5.0–7.5)
Protein, UA: NEGATIVE
RBC, UA: NEGATIVE
Specific Gravity, UA: 1.01 (ref 1.005–1.030)
UUROB: 0.2 mg/dL (ref 0.2–1.0)

## 2018-01-09 LAB — MICROALBUMIN, URINE WAIVED
Creatinine, Urine Waived: 10 mg/dL (ref 10–300)
Microalb, Ur Waived: 10 mg/L (ref 0–19)
Microalb/Creat Ratio: <30 mg/g (ref <30)

## 2018-01-09 MED ORDER — NORTRIPTYLINE HCL 10 MG PO CAPS: 10.0000 mg | ORAL_CAPSULE | Freq: Every day | ORAL | 1 refills | Status: DC

## 2018-01-09 MED ORDER — OMEPRAZOLE 40 MG PO CPDR: 40.0000 mg | DELAYED_RELEASE_CAPSULE | Freq: Every day | ORAL | 3 refills | Status: DC

## 2018-01-09 MED ORDER — METOPROLOL SUCCINATE ER 50 MG PO TB24: 50.0000 mg | ORAL_TABLET | Freq: Every day | ORAL | 1 refills | Status: DC

## 2018-01-09 MED ORDER — GABAPENTIN 300 MG PO CAPS: ORAL_CAPSULE | ORAL | 1 refills | Status: DC

## 2018-01-09 MED ORDER — GABAPENTIN 800 MG PO TABS: 800.0000 mg | ORAL_TABLET | Freq: Three times a day (TID) | ORAL | 3 refills | Status: DC

## 2018-01-09 MED ORDER — LORATADINE 10 MG PO TABS: 10.0000 mg | ORAL_TABLET | Freq: Every day | ORAL | 3 refills | Status: DC

## 2018-01-09 MED ORDER — HYDROCODONE-ACETAMINOPHEN 10-325 MG PO TABS: 1.0000 | ORAL_TABLET | Freq: Two times a day (BID) | ORAL | 0 refills | Status: DC | PRN

## 2018-01-09 MED ORDER — MELOXICAM 7.5 MG PO TABS: ORAL_TABLET | ORAL | 1 refills | Status: DC

## 2018-01-09 NOTE — Assessment & Plan Note (Signed)
Doing better on current regimen. Continue current regimen. Refill given. Recheck 1 month. To see pain management on 01/18/18. 

## 2018-01-09 NOTE — Progress Notes (Signed)
BP 139/90 (BP Location: Left Arm, Patient Position: Sitting, Cuff Size: Normal)   Pulse 81   Temp 98.6 F (37 C)   Wt 184 lb 3 oz (83.5 kg)   SpO2 97%   BMI 28.34 kg/m    Subjective:    Patient ID: Brandon Myers, male    DOB: 05/11/1966, 52 y.o.   MRN: [id number]  HPI: Brandon Myers is a 52 y.o. male  Chief Complaint  Patient presents with  . Pain  . Hypertension  . Hyperlipidemia   CHRONIC PAIN- started on nortriptyline last time. The 25mg  made him sleepy during the day, so we decreased it to 10mg  daily. He is scheduled to see pain management on 01/18/18 Present dose: 20 Morphine equivalents Pain control status: stable Duration: chronic Location: phantom limp pain in his arm and leg Quality: stabbing Current Pain Level: 4/10 Previous Pain Level: 5/10 Breakthrough pain: yes- when he doesn't have his leg on Benefit from narcotic medications: yes What Activities task can be accomplished with current medication? Able to do his ADLs, able to sleep Interested in weaning off narcotics:no   Stool softners/OTC fiber: no  Previous pain specialty evaluation: no Non-narcotic analgesic meds: yes- max dose gabapentin, nortriptyline Narcotic contract: yes  HYPERTENSION / HYPERLIPIDEMIA Satisfied with current treatment? yes Duration of hypertension: chronic BP monitoring frequency: not checking BP medication side effects: no Past BP meds: losartan, metoprolol Duration of hyperlipidemia: chronic Cholesterol medication side effects: not on anything Cholesterol supplements: none Medication compliance: excellent compliance Aspirin: no Recent stressors: no Recurrent headaches: no Visual changes: no Palpitations: no Dyspnea: no Chest pain: no Lower extremity edema: no Dizzy/lightheaded: no   Relevant past medical, surgical, family and social history reviewed and updated as indicated. Interim medical history since our last visit reviewed. Allergies and medications reviewed and  updated.  Review of Systems  Constitutional: Negative.   Respiratory: Negative.   Cardiovascular: Negative.   Musculoskeletal: Negative.   Skin: Negative.   Neurological: Negative.   Psychiatric/Behavioral: Negative.     Per HPI unless specifically indicated above     Objective:    BP 139/90 (BP Location: Left Arm, Patient Position: Sitting, Cuff Size: Normal)   Pulse 81   Temp 98.6 F (37 C)   Wt 184 lb 3 oz (83.5 kg)   SpO2 97%   BMI 28.34 kg/m   Wt Readings from Last 3 Encounters:  01/09/18 184 lb 3 oz (83.5 kg)  12/14/17 181 lb 7 oz (82.3 kg)  11/08/17 183 lb 12.8 oz (83.4 kg)    Physical Exam  Constitutional: He is oriented to person, place, and time. He appears well-developed and well-nourished. No distress.  R leg and R arm amputated   HENT:  Head: Normocephalic and atraumatic.  Right Ear: Hearing normal.  Left Ear: Hearing normal.  Nose: Nose normal.  Eyes: Conjunctivae and lids are normal. Right eye exhibits no discharge. Left eye exhibits no discharge. No scleral icterus.  Cardiovascular: Normal rate, regular rhythm, normal heart sounds and intact distal pulses. Exam reveals no gallop and no friction rub.  No murmur heard. Pulmonary/Chest: Effort normal and breath sounds normal. No stridor. No respiratory distress. He has no wheezes. He has no rales. He exhibits no tenderness.  Musculoskeletal: Normal range of motion.  Neurological: He is alert and oriented to person, place, and time.  Skin: Skin is warm, dry and intact. Capillary refill takes less than 2 seconds. No rash noted. He is not diaphoretic. No erythema. No  pallor.  Psychiatric: He has a normal mood and affect. His speech is normal and behavior is normal. Judgment and thought content normal. Cognition and memory are normal.  Nursing note and vitals reviewed.   Results for orders placed or performed in visit on 03/30/17  Urine drugs of abuse scrn w alc, routine (Ref Lab)  Result Value Ref Range     Amphetamines, Urine Negative Cutoff=1000 ng/mL   Barbiturate Quant, Ur Negative Cutoff=300 ng/mL   Benzodiazepine Quant, Ur Negative Cutoff=300 ng/mL   Cannabinoid Quant, Ur Negative Cutoff=50 ng/mL   Cocaine (Metab.) Negative Cutoff=300 ng/mL   Opiate Quant, Ur Negative Cutoff=300 ng/mL   PCP Quant, Ur Negative Cutoff=25 ng/mL   Methadone Screen, Urine Negative Cutoff=300 ng/mL   Propoxyphene Negative Cutoff=300 ng/mL   Ethanol, Urine Negative Cutoff=0.020 %      Assessment & Plan:   Problem List Items Addressed This Visit      Cardiovascular and Mediastinum   Essential hypertension    Under good control. Continue current regimen. Continue to monitor. Labs checked today.      Relevant Medications   metoprolol succinate (TOPROL-XL) 50 MG 24 hr tablet   Other Relevant Orders   CBC with Differential/Platelet   Comprehensive metabolic panel   Microalbumin, Urine Waived   TSH   UA/M w/rflx Culture, Routine     Respiratory   Seasonal allergic rhinitis   Relevant Medications   loratadine (CLARITIN) 10 MG tablet     Digestive   Esophageal reflux    Stable on current regimen. Continue current regimen. Continue to monitor. Call with any concerns. Refills up to date.       Relevant Medications   omeprazole (PRILOSEC) 40 MG capsule   Other Relevant Orders   CBC with Differential/Platelet   Comprehensive metabolic panel   TSH   UA/M w/rflx Culture, Routine     Nervous and Auditory   Phantom limb pain (HCC) - Primary    Doing better on current regimen. Continue current regimen. Refill given. Recheck 1 month. To see pain management on 01/18/18.      Relevant Medications   gabapentin (NEURONTIN) 300 MG capsule   gabapentin (NEURONTIN) 800 MG tablet   nortriptyline (PAMELOR) 10 MG capsule   Other Relevant Orders   CBC with Differential/Platelet   Comprehensive metabolic panel   TSH   UA/M w/rflx Culture, Routine     Other   Congenital agranulocytosis (HCC)     Continue to follow with hematology. Checking labs today. Await results.       Congenital neutropenia (HCC)    Continue to follow with hematology. Checking labs today. Await results.       Relevant Orders   CBC with Differential/Platelet   Comprehensive metabolic panel   TSH   UA/M w/rflx Culture, Routine   Hyperlipidemia    Rechecking levels today. Await results. Treat as needed.       Relevant Medications   metoprolol succinate (TOPROL-XL) 50 MG 24 hr tablet   Other Relevant Orders   CBC with Differential/Platelet   Comprehensive metabolic panel   Lipid Panel w/o Chol/HDL Ratio   TSH   UA/M w/rflx Culture, Routine   Chronic pain syndrome    Doing better on current regimen. Continue current regimen. Refill given. Recheck 1 month. To see pain management on 01/18/18.          Follow up plan: Return in about 1 month (around 02/06/2018) for Follow up pain.

## 2018-01-09 NOTE — Assessment & Plan Note (Signed)
Continue to follow with hematology. Checking labs today. Await results.  

## 2018-01-09 NOTE — Assessment & Plan Note (Signed)
Doing better on current regimen. Continue current regimen. Refill given. Recheck 1 month. To see pain management on 01/18/18.

## 2018-01-09 NOTE — Assessment & Plan Note (Signed)
Rechecking levels today. Await results. Treat as needed.  

## 2018-01-09 NOTE — Assessment & Plan Note (Signed)
Under good control. Continue current regimen. Continue to monitor. Labs checked today. 

## 2018-01-09 NOTE — Assessment & Plan Note (Signed)
Stable on current regimen. Continue current regimen. Continue to monitor. Call with any concerns. Refills up to date.

## 2018-01-09 NOTE — Assessment & Plan Note (Signed)
Continue to follow with hematology. Checking labs today. Await results.

## 2018-01-10 LAB — COMPREHENSIVE METABOLIC PANEL
ALT: 44 IU/L (ref 0–44)
AST: 37 IU/L (ref 0–40)
Albumin/Globulin Ratio: 2 (ref 1.2–2.2)
Albumin: 4.7 g/dL (ref 3.5–5.5)
Alkaline Phosphatase: 132 IU/L — ABNORMAL HIGH (ref 39–117)
BUN / CREAT RATIO: 20 (ref 9–20)
BUN: 17 mg/dL (ref 6–24)
Bilirubin Total: 0.6 mg/dL (ref 0.0–1.2)
CO2: 23 mmol/L (ref 20–29)
CREATININE: 0.86 mg/dL (ref 0.76–1.27)
Calcium: 9.8 mg/dL (ref 8.7–10.2)
Chloride: 102 mmol/L (ref 96–106)
GFR calc non Af Amer: 102 mL/min/{1.73_m2} (ref >59)
GFR, EST AFRICAN AMERICAN: 118 mL/min/{1.73_m2} (ref >59)
GLUCOSE: 87 mg/dL (ref 65–99)
Globulin, Total: 2.4 g/dL (ref 1.5–4.5)
Potassium: 4.3 mmol/L (ref 3.5–5.2)
Sodium: 142 mmol/L (ref 134–144)
TOTAL PROTEIN: 7.1 g/dL (ref 6.0–8.5)

## 2018-01-10 LAB — LIPID PANEL W/O CHOL/HDL RATIO
CHOLESTEROL TOTAL: 135 mg/dL (ref 100–199)
HDL: 26 mg/dL — AB (ref >39)
LDL CALC: 63 mg/dL (ref 0–99)
Triglycerides: 228 mg/dL — ABNORMAL HIGH (ref 0–149)
VLDL CHOLESTEROL CAL: 46 mg/dL — AB (ref 5–40)

## 2018-01-10 LAB — TSH: TSH: 3.58 u[IU]/mL (ref 0.450–4.500)

## 2018-01-10 LAB — CBC WITH DIFFERENTIAL/PLATELET
BASOS ABS: 0.1 10*3/uL (ref 0.0–0.2)
Basos: 2 %
EOS (ABSOLUTE): 0.5 10*3/uL — ABNORMAL HIGH (ref 0.0–0.4)
EOS: 9 %
HEMOGLOBIN: 14.1 g/dL (ref 13.0–17.7)
Hematocrit: 42.7 % (ref 37.5–51.0)
IMMATURE GRANULOCYTES: 0 %
Immature Grans (Abs): 0 10*3/uL (ref 0.0–0.1)
LYMPHS: 41 %
Lymphocytes Absolute: 2 10*3/uL (ref 0.7–3.1)
MCH: 28.6 pg (ref 26.6–33.0)
MCHC: 33 g/dL (ref 31.5–35.7)
MCV: 87 fL (ref 79–97)
MONOCYTES: 22 %
Monocytes Absolute: 1.1 10*3/uL — ABNORMAL HIGH (ref 0.1–0.9)
NEUTROS PCT: 26 %
Neutrophils Absolute: 1.3 10*3/uL — ABNORMAL LOW (ref 1.4–7.0)
PLATELETS: 183 10*3/uL (ref 150–450)
RBC: 4.93 x10E6/uL (ref 4.14–5.80)
RDW: 14.1 % (ref 12.3–15.4)
WBC: 4.9 10*3/uL (ref 3.4–10.8)

## 2018-01-18 ENCOUNTER — Other Ambulatory Visit: Payer: Self-pay

## 2018-01-18 ENCOUNTER — Ambulatory Visit
Payer: Medicare Other | Attending: Student in an Organized Health Care Education/Training Program | Admitting: Student in an Organized Health Care Education/Training Program

## 2018-01-18 ENCOUNTER — Encounter: Payer: Self-pay | Admitting: Student in an Organized Health Care Education/Training Program

## 2018-01-18 VITALS — BP 124/98 | HR 82 | Temp 98.5°F | Resp 16 | Ht 68.0 in | Wt 185.0 lb

## 2018-01-18 DIAGNOSIS — Z89619 Acquired absence of unspecified leg above knee: Secondary | ICD-10-CM | POA: Diagnosis not present

## 2018-01-18 DIAGNOSIS — Z79899 Other long term (current) drug therapy: Secondary | ICD-10-CM | POA: Insufficient documentation

## 2018-01-18 DIAGNOSIS — I10 Essential (primary) hypertension: Secondary | ICD-10-CM | POA: Diagnosis not present

## 2018-01-18 DIAGNOSIS — D700 Congenital agranulocytosis: Secondary | ICD-10-CM | POA: Diagnosis not present

## 2018-01-18 DIAGNOSIS — G894 Chronic pain syndrome: Secondary | ICD-10-CM | POA: Diagnosis present

## 2018-01-18 DIAGNOSIS — K219 Gastro-esophageal reflux disease without esophagitis: Secondary | ICD-10-CM | POA: Diagnosis not present

## 2018-01-18 DIAGNOSIS — E785 Hyperlipidemia, unspecified: Secondary | ICD-10-CM | POA: Diagnosis not present

## 2018-01-18 DIAGNOSIS — M79601 Pain in right arm: Secondary | ICD-10-CM | POA: Diagnosis not present

## 2018-01-18 DIAGNOSIS — S88919A Complete traumatic amputation of unspecified lower leg, level unspecified, initial encounter: Secondary | ICD-10-CM

## 2018-01-18 DIAGNOSIS — Z888 Allergy status to other drugs, medicaments and biological substances status: Secondary | ICD-10-CM | POA: Insufficient documentation

## 2018-01-18 DIAGNOSIS — Z882 Allergy status to sulfonamides status: Secondary | ICD-10-CM | POA: Insufficient documentation

## 2018-01-18 DIAGNOSIS — G546 Phantom limb syndrome with pain: Secondary | ICD-10-CM | POA: Diagnosis not present

## 2018-01-18 DIAGNOSIS — M79604 Pain in right leg: Secondary | ICD-10-CM | POA: Insufficient documentation

## 2018-01-18 DIAGNOSIS — S48911A Complete traumatic amputation of right shoulder and upper arm, level unspecified, initial encounter: Secondary | ICD-10-CM

## 2018-01-18 DIAGNOSIS — G8929 Other chronic pain: Secondary | ICD-10-CM | POA: Diagnosis not present

## 2018-01-18 NOTE — Progress Notes (Signed)
Patient's Name: Brandon Myers  MRN: [id number]  Referring Provider: Valerie Roys, DO  DOB: 05/11/1966  PCP: Brandon American, PA-C  DOS: 01/18/2018  Note by: [name], MD  Service setting: Ambulatory outpatient  Specialty: Interventional Pain Management  Location: [facility] (AMB) Pain Management Facility  Visit type: Initial Patient Evaluation  Patient type: New Patient   Primary Reason(s) for Visit: Encounter for initial evaluation of one or more chronic problems (new to examiner) potentially causing chronic pain, and posing a threat to normal musculoskeletal function. (Level of risk: High) CC: Leg Pain (right upper leg) and Arm Pain (right upper )  HPI  [name] is a 52 y.o. year old, male patient, who comes today to see [place] for the first time for an initial evaluation of his chronic pain. 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 Today he comes in for evaluation of his Leg Pain (right upper leg) and Arm Pain (right upper )  Pain Assessment: Location: Right, Upper Leg Radiating: denies Onset: More than a month ago Duration: Chronic pain, Other (Comment)(phantom pain) Quality: Tingling, Shooting Severity: 6 /10 (subjective, self-reported pain score)  Note: Reported level is inconsistent with clinical observations.                         When using our objective Pain Scale, levels between 6 and 10/10 are said to belong in an emergency room, as it progressively worsens from a 6/10, described as severely limiting, requiring emergency care not usually available at an outpatient pain management  facility. At a 6/10 level, communication becomes difficult and requires great effort. Assistance to reach the emergency department may be required. Facial flushing and profuse sweating along with potentially dangerous increases in heart rate and blood pressure will be evident. Effect on ADL: difficulty performing daily activities Timing: Constant Modifying factors: using prothesis BP: (!) 124/98  HR: 82  Onset and Duration: Present longer than 3 months Cause of pain: amputation   Severity: Getting worse, NAS-11 at its worse: 10/10, NAS-11 at its best: 4/10, NAS-11 now: 5/10 and NAS-11 on the average: 7/10 Timing: Night Aggravating Factors: Prolonged standing and Walking Alleviating Factors: Medications and Sleeping Associated Problems: Spasms, Tingling and Pain that does not allow patient to sleep Quality of Pain: Exhausting, Sharp, Shooting, Stabbing and Throbbing Previous Examinations or Tests: The patient denies any previous exams or tests Previous Treatments: Narcotic medications  The patient comes into the clinics today for the first time for a chronic pain management evaluation.   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  Patient also has a history of congenital neutropenia.  Patient does experience phantom pain in both of his amputated extremities.  His current medications include 800 mg of gabapentin 3 times a day plus an extra dose of 200 mg equaling a total dose of 2700 mg.  Patient was recently started on nortriptyline 10 mg nightly.  Initially found this medication to result in sedation but that has now gotten better.  The patient was previously utilizing tramadol 50 mg twice daily but over the last 3  months, he states that his phantom limb pain is worsened.  His primary care provider has prescribed him a short course of hydrocodone which he does find somewhat effective.  He finds it more  effective than tramadol.  He is referred here for chronic opioid therapy.  Patient denies misusing/abusing/diverting his medications.  We will obtain UDS today.   Today I took the time to provide the patient with information regarding my pain practice. The patient was informed that my practice is divided into two sections: an interventional pain management section, as well as a completely separate and distinct medication management section. I explained that I have procedure days for my interventional therapies, and evaluation days for follow-ups and medication management. Because of the amount of documentation required during both, they are kept separated. This means that there is the possibility that he may be scheduled for a procedure on one day, and medication management the next. I have also informed him that because of staffing and facility limitations, I no longer take patients for medication management only. To illustrate the reasons for this, I gave the patient the example of surgeons, and how inappropriate it would be to refer a patient to his/her care, just to write for the post-surgical antibiotics on a surgery done by a different surgeon.   Because interventional pain management is my board-certified specialty, the patient was informed that joining my practice means that they are open to any and all interventional therapies. I made it clear that this does not mean that they will be forced to have any procedures done. What this means is that I believe interventional therapies to be essential part of the diagnosis and proper management of chronic pain conditions. Therefore, patients not interested in these interventional alternatives will be better served under the care of a different practitioner.  The patient was also made aware of my Comprehensive Pain Management Safety Guidelines where by joining my practice, they limit all of their nerve blocks and joint injections to those done by our practice,  for as long as we are retained to manage their care.   Historic Controlled Substance Pharmacotherapy Review  PMP and historical list of controlled substances:  Tramadol 50 mg twice daily as needed, quantity 60/month, last filled 10/23/2017.  Most recent dose of hydrocodone Provided 12/14/2017 MME/day: Tramadol MME was 10; recent hydrocodone MME of 10 twice daily as needed is equal to approximately 20 mg/day Medications: The patient did not bring the medication(s) to the appointment, as requested in our "New Patient Package" Pharmacodynamics: Desired effects: Analgesia: The patient reports >50% benefit. Reported improvement in function: The patient reports medication allows him to accomplish basic ADLs. Clinically meaningful improvement in function (CMIF): Sustained CMIF goals met Perceived effectiveness: Described as relatively effective, allowing for increase in activities of daily living (ADL) Undesirable effects: Side-effects or Adverse reactions: None reported Historical Monitoring: The patient  reports that he does not use drugs. List of all UDS Test(s): Lab Results  Component Value Date   COCAINSCRNUR Negative 03/30/2017   PCPQUANT Negative 03/30/2017   CANNABQUANT Negative 03/30/2017   List of other Serum/Urine Drug Screening Test(s):  Lab Results  Component Value Date   COCAINSCRNUR Negative 03/30/2017   PCPQUANT Negative 03/30/2017   CANNABQUANT Negative 03/30/2017   Historical Background Evaluation: [place] PMP: Six (6) year initial data search conducted.             [place] Department of public safety, offender search: Editor, commissioning Information) Non-contributory Risk Assessment Profile: Aberrant behavior: None  observed or detected today Risk factors for fatal opioid overdose: None identified today Fatal overdose hazard ratio (HR): Calculation deferred Non-fatal overdose hazard ratio (HR): Calculation deferred Risk of opioid abuse or dependence: 0.7-3.0% with doses ? 36 MME/day and  6.1-26% with doses ? 120 MME/day. Substance use disorder (SUD) risk level: Moderate Opioid risk tool (ORT) (Total Score):    ORT Scoring interpretation table:  Score <3 = Low Risk for SUD  Score between 4-7 = Moderate Risk for SUD  Score >8 = High Risk for Opioid Abuse   PHQ-2 Depression Scale:  Total score: 0  PHQ-2 Scoring interpretation table: (Score and probability of major depressive disorder)  Score 0 = No depression  Score 1 = 15.4% Probability  Score 2 = 21.1% Probability  Score 3 = 38.4% Probability  Score 4 = 45.5% Probability  Score 5 = 56.4% Probability  Score 6 = 78.6% Probability   PHQ-9 Depression Scale:  Total score: 0  PHQ-9 Scoring interpretation table:  Score 0-4 = No depression  Score 5-9 = Mild depression  Score 10-14 = Moderate depression  Score 15-19 = Moderately severe depression  Score 20-27 = Severe depression (2.4 times higher risk of SUD and 2.89 times higher risk of overuse)   Pharmacologic Plan: As per protocol, I have not taken over any controlled substance management, pending the results of ordered tests and/or consults.            Initial impression: Pending review of available data and ordered tests.  Meds   Current Outpatient Medications:  .  Filgrastim-sndz (ZARXIO) 480 MCG/0.8ML SOSY, Inject 0.8 mLs into the muscle every other day., Disp: , Rfl:  .  fluticasone (FLONASE) 50 MCG/ACT nasal spray, INSTILL 1 TO 2 SPRAYS IN EACH NOSTRIL EVERY NIGHT AT BEDTIME x 2-3 weeks, Disp: 16 g, Rfl: 12 .  gabapentin (NEURONTIN) 300 MG capsule, Take 1 capsule once day in mid day, Disp: 90 capsule, Rfl: 1 .  gabapentin (NEURONTIN) 800 MG tablet, Take 1 tablet (800 mg total) by mouth 3 (three) times daily., Disp: 270 tablet, Rfl: 3 .  HYDROcodone-acetaminophen (NORCO) 10-325 MG tablet, Take 1 tablet by mouth 2 (two) times daily as needed for severe pain., Disp: 45 tablet, Rfl: 0 .  loratadine (CLARITIN) 10 MG tablet, Take 1 tablet (10 mg total) by mouth  daily., Disp: 90 tablet, Rfl: 3 .  losartan (COZAAR) 100 MG tablet, Take 1 tablet (100 mg total) by mouth daily., Disp: 90 tablet, Rfl: 1 .  Melatonin 10 MG TABS, Take 10 mg by mouth at bedtime., Disp: , Rfl:  .  metoprolol succinate (TOPROL-XL) 50 MG 24 hr tablet, Take 1 tablet (50 mg total) by mouth daily. Take with or immediately following a meal., Disp: 90 tablet, Rfl: 1 .  nortriptyline (PAMELOR) 10 MG capsule, Take 1 capsule (10 mg total) by mouth at bedtime., Disp: 90 capsule, Rfl: 1 .  omeprazole (PRILOSEC) 40 MG capsule, Take 1 capsule (40 mg total) by mouth daily., Disp: 90 capsule, Rfl: 3 .  VENTOLIN HFA 108 (90 Base) MCG/ACT inhaler, INHALE 2 PUFFS BY MOUTH FOUR TIMES DAILY AS NEEDED, Disp: 18 g, Rfl: 12  Imaging Review    ROS  Cardiovascular: High blood pressure Pulmonary or Respiratory: Coughing up mucus (Bronchitis) Neurological: No reported neurological signs or symptoms such as seizures, abnormal skin sensations, urinary and/or fecal incontinence, being born with an abnormal open spine and/or a tethered spinal cord Review of Past Neurological Studies: No results found for  this or any previous visit. Psychological-Psychiatric: No reported psychological or psychiatric signs or symptoms such as difficulty sleeping, anxiety, depression, delusions or hallucinations (schizophrenial), mood swings (bipolar disorders) or suicidal ideations or attempts Gastrointestinal: Reflux or heatburn Genitourinary: No reported renal or genitourinary signs or symptoms such as difficulty voiding or producing urine, peeing blood, non-functioning kidney, kidney stones, difficulty emptying the bladder, difficulty controlling the flow of urine, or chronic kidney disease Hematological: No reported hematological signs or symptoms such as prolonged bleeding, low or poor functioning platelets, bruising or bleeding easily, hereditary bleeding problems, low energy levels due to low hemoglobin or being  anemic Endocrine: No reported endocrine signs or symptoms such as high or low blood sugar, rapid heart rate due to high thyroid levels, obesity or weight gain due to slow thyroid or thyroid disease Rheumatologic: No reported rheumatological signs and symptoms such as fatigue, joint pain, tenderness, swelling, redness, heat, stiffness, decreased range of motion, with or without associated rash Musculoskeletal: Negative for myasthenia gravis, muscular dystrophy, multiple sclerosis or malignant hyperthermia Work History: Disabled  Allergies  Mr. Wos is allergic to enalapril; prochlorperazine; sulfa antibiotics; benadryl [diphenhydramine]; and strawberry extract.  Laboratory Chemistry  Inflammation Markers (CRP: Acute Phase) (ESR: Chronic Phase) No results found for: CRP, ESRSEDRATE, LATICACIDVEN                       Rheumatology Markers No results found for: RF, ANA, LABURIC, URICUR, LYMEIGGIGMAB, LYMEABIGMQN, HLAB27                      Renal Function Markers Lab Results  Component Value Date   BUN 17 01/09/2018   CREATININE 0.86 01/09/2018   BCR 20 01/09/2018   GFRAA 118 01/09/2018   GFRNONAA 102 01/09/2018                             Hepatic Function Markers Lab Results  Component Value Date   AST 37 01/09/2018   ALT 44 01/09/2018   ALBUMIN 4.7 01/09/2018   ALKPHOS 132 (H) 01/09/2018                        Electrolytes Lab Results  Component Value Date   NA 142 01/09/2018   K 4.3 01/09/2018   CL 102 01/09/2018   CALCIUM 9.8 01/09/2018                        Neuropathy Markers Lab Results  Component Value Date   HGBA1C 5.1 12/09/2016                        Bone Pathology Markers No results found for: [place], VD125OH2TOT, EX5170YF7, CB4496PR9, 25OHVITD1, 25OHVITD2, 25OHVITD3, TESTOFREE, TESTOSTERONE                       Coagulation Parameters Lab Results  Component Value Date   PLT 183 01/09/2018                        Cardiovascular Markers Lab Results   Component Value Date   HGB 14.1 01/09/2018   HCT 42.7 01/09/2018                         CA Markers No results found for: CEA, CA125, LABCA2  Note: Lab results reviewed.  [place]  Drug: Mr. Thoma  reports that he does not use drugs. Alcohol:  reports that he does not drink alcohol. Tobacco:  reports that he has never smoked. He has never used smokeless tobacco. Medical:  has a past medical history of Allergy, GERD (gastroesophageal reflux disease), Hypertension, and Neutropenia, congenital ([place]). Family: family history includes Alzheimer's disease in his mother; Asthma in his mother; Cancer in his father, paternal grandfather, and paternal grandmother; Depression in his mother; Heart Problems in his mother; Stroke in his maternal grandmother; Thyroid disease in his mother.  Past Surgical History:  Procedure Laterality Date  . ABOVE ELBOW ARM AMPUTATION Right 1993  . AMPUTATION    . APPENDECTOMY    . COLON SURGERY  1994  . LEG AMPUTATION THROUGH KNEE Right 1993   Active Ambulatory Problems    Diagnosis Date Noted  . 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 Essential hypertension 04/27/2015  . Seasonal allergic rhinitis 04/27/2015  . Esophageal reflux 04/27/2015  . 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 Amputation of arm, right ([place]) 08/31/2015  . 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([place]) 08/31/2015  . Tendonitis of wrist, left 08/31/2015  . Phantom limb pain ([place]) 10/29/2015  . Visual floaters, bilateral 07/01/2016  . ACE-inhibitor cough 10/04/2016  . Chronic pain of right lower extremity 10/04/2016  . Hyperlipidemia 12/06/2016  . Controlled substance agreement signed 04/02/2017  . Chronic pain syndrome 12/14/2017   Resolved Ambulatory Problems    Diagnosis Date Noted  . No Resolved Ambulatory Problems   Past Medical History:  Diagnosis Date  . Allergy   . GERD (gastroesophageal reflux disease)   . Hypertension   . Neutropenia, congenital ([place])     Constitutional Exam  General appearance: Well nourished, well developed, and well hydrated. In no apparent acute distress Vitals:   01/18/18 1059  BP: (!) 124/98  Pulse: 82  Resp: 16  Temp: 98.5 F (36.9 C)  TempSrc: Oral  SpO2: 96%  Weight: 185 lb (83.9 kg)  Height: '5\' 8"'$  (1.727 m)   BMI Assessment: Estimated body mass index is 28.13 kg/m as calculated from the following:   Height as of this encounter: '5\' 8"'$  (1.727 m).   Weight as of this encounter: 185 lb (83.9 kg).  BMI interpretation table: BMI level Category Range association with higher incidence of chronic pain  <18 kg/m2 Underweight   18.5-24.9 kg/m2 Ideal body weight   25-29.9 kg/m2 Overweight Increased incidence by 20%  30-34.9 kg/m2 Obese (Class I) Increased incidence by 68%  35-39.9 kg/m2 Severe obesity (Class II) Increased incidence by 136%  >40 kg/m2 Extreme obesity (Class III) Increased incidence by 254%   Patient's current BMI Ideal Body weight  Body mass index is 28.13 kg/m. Ideal body weight: 68.4 kg (150 lb 12.7 oz) Adjusted ideal body weight: 74.6 kg (164 lb 7.6 oz)   BMI Readings from Last 4 Encounters:  01/18/18 28.13 kg/m  01/09/18 28.34 kg/m  12/14/17 27.91 kg/m  11/08/17 28.28 kg/m   Wt Readings from Last 4 Encounters:  01/18/18 185 lb (83.9 kg)  01/09/18 184 lb 3 oz (83.5 kg)  12/14/17 181 lb 7 oz (82.3 kg)  11/08/17 183 lb 12.8 oz (83.4 kg)  Psych/Mental status: Alert, oriented x 3 (person, place, & time)       Eyes: PERLA Respiratory: No evidence of acute respiratory distress  Cervical Spine Area Exam  Skin & Axial Inspection: No masses, redness, edema, swelling, or associated skin lesions Alignment: Symmetrical Functional ROM:  Unrestricted ROM      Stability: No instability detected Muscle Tone/Strength: Functionally intact. No obvious neuro-muscular anomalies detected. Sensory (Neurological): Unimpaired Palpation: No palpable anomalies              Upper Extremity (UE)  Exam    Side: Right upper extremity  Side: Left upper extremity  Skin & Extremity Inspection: Above elbow amputation (AEA)  Skin & Extremity Inspection: Skin color, temperature, and hair growth are WNL. No peripheral edema or cyanosis. No masses, redness, swelling, asymmetry, or associated skin lesions. No contractures.  Functional ROM: Decreased ROM          Functional ROM: Unrestricted ROM          Muscle Tone/Strength: Deconditioned  Muscle Tone/Strength: Functionally intact. No obvious neuro-muscular anomalies detected.  Sensory (Neurological): Neuropathic pain pattern          Sensory (Neurological): Unimpaired          Palpation: No palpable anomalies              Palpation: No palpable anomalies              Provocative Test(s):  Phalen's test: deferred Tinel's test: deferred Apley's scratch test (touch opposite shoulder):  Action 1 (Across chest): deferred Action 2 (Overhead): deferred Action 3 (LB reach): deferred   Provocative Test(s):  Phalen's test: deferred Tinel's test: deferred Apley's scratch test (touch opposite shoulder):  Action 1 (Across chest): deferred Action 2 (Overhead): deferred Action 3 (LB reach): deferred    Thoracic Spine Area Exam  Skin & Axial Inspection: No masses, redness, or swelling Alignment: Symmetrical Functional ROM: Unrestricted ROM Stability: No instability detected Muscle Tone/Strength: Functionally intact. No obvious neuro-muscular anomalies detected. Sensory (Neurological): Unimpaired Muscle strength & Tone: No palpable anomalies  Lumbar Spine Area Exam  Skin & Axial Inspection: No masses, redness, or swelling Alignment: Symmetrical Functional ROM: Unrestricted ROM       Stability: No instability detected Muscle Tone/Strength: Functionally intact. No obvious neuro-muscular anomalies detected. Sensory (Neurological): Unimpaired Palpation: No palpable anomalies       Provocative Tests: Lumbar Hyperextension/rotation test: deferred  today       Lumbar quadrant test (Kemp's test): deferred today       Lumbar Lateral bending test: deferred today       Patrick's Maneuver: deferred today                   FABER test: deferred today       Thigh-thrust test: deferred today       S-I compression test: deferred today       S-I distraction test: deferred today        Gait & Posture Assessment  Ambulation: Limited Gait: Antalgic gait (limping) Posture: Difficulty with positional changes   Lower Extremity Exam    Side: Right lower extremity  Side: Left lower extremity  Stability: No instability observed          Stability: No instability observed          Skin & Extremity Inspection: Above knee amputation (AKA)  Skin & Extremity Inspection: Skin color, temperature, and hair growth are WNL. No peripheral edema or cyanosis. No masses, redness, swelling, asymmetry, or associated skin lesions. No contractures.  Functional ROM: Decreased ROM                  Functional ROM: Unrestricted ROM  Muscle Tone/Strength: Functionally intact. No obvious neuro-muscular anomalies detected.  Muscle Tone/Strength: Functionally intact. No obvious neuro-muscular anomalies detected.  Sensory (Neurological): Neuropathic pain pattern  Sensory (Neurological): Unimpaired  Palpation: No palpable anomalies  Palpation: No palpable anomalies   Assessment  Primary Diagnosis & Pertinent Problem List: The primary encounter diagnosis was Chronic pain syndrome. 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  Visit Diagnosis (New problems to examiner): 1. Chronic pain syndrome   2. Amputation of arm, right ([place])   3. 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([place])   4. Congenital neutropenia ([facility])   5. Phantom limb pain ([place])   6. Chronic pain of right lower extremity    General Recommendations: The pain  condition that the patient suffers from is best treated with a multidisciplinary approach that involves an increase in physical activity to prevent de-conditioning and worsening of the pain cycle, as well as psychological counseling (formal and/or informal) to address the co-morbid psychological affects of pain. Treatment will often involve judicious use of pain medications and interventional procedures to decrease the pain, allowing the patient to participate in the physical activity that will ultimately produce long-lasting pain reductions. The goal of the multidisciplinary approach is to return the patient to a higher level of overall function and to restore their ability to perform activities of daily living.  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  Patient also has a history of congenital neutropenia.  Patient does experience phantom pain in both of his amputated extremities.  His current medications include 800 mg of gabapentin 3 times a day plus an extra dose of 200 mg equaling a total dose of 2700 mg.  Patient was recently started on nortriptyline 10 mg nightly.  Initially found this medication to result in sedation but that has now gotten better.  The patient was previously utilizing tramadol 50 mg twice daily but over the last 3 months, he states that his phantom limb pain is worsened.  His primary care provider has prescribed him a short course of hydrocodone which he does find somewhat effective.  He finds it more effective than tramadol.  He is referred here for chronic opioid therapy.  Patient denies misusing/abusing/diverting his medications.    We will obtain UDS today.  Pending UDS screen, will consider hydrocodone at a dose of 5 mg 3 times daily as needed or 10 mg twice daily as needed.  Patient to continue gabapentin total daily dose of 2700 mg, nortriptyline 10 mg.  Plan of Care (Initial workup  plan)  Note: Please be advised that as per protocol, today's visit has been an evaluation only. We have not taken over the patient's controlled substance management.  Ordered Lab-work, Procedure(s), Referral(s), & Consult(s): Orders Placed This Encounter  Procedures  . Compliance Drug Analysis, Ur    Pharmacological management options:  Opioid Analgesics: The patient was informed that there is no guarantee that he would be a candidate for opioid analgesics. The decision will be made following CDC guidelines. This decision will be based on the results of diagnostic studies, as well as Mr. Bakke's risk profile.   Membrane stabilizer: Continue gabapentin daily dose 2700 mg (800 mill grams 3 times daily +300 mg) can consider Cymbalta, Lyrica in the future.  Muscle relaxant: To be determined  at a later time  NSAID: Medically contraindicated  Other analgesic(s): To be determined at a later time   Provider-requested follow-up: Return in about 1 month (around 02/15/2018) for Medication Management.   Future Appointments  Date Time Provider [place]  02/14/2018  1:45 PM [name], MD [facility] None  02/15/2018  1:15 PM Brandon Roys, DO [facility]  03/09/2018  1:00 PM CFP NURSE HEALTH ADVISOR [facility]  03/09/2018  2:00 PM Brandon American, PA-C [facility]    Primary Care Physician: Brandon American, PA-C Location: [facility] Outpatient Pain Management Facility Note by: [name], M.D, Date: 01/18/2018; Time: 12:39 PM  There are no Patient Instructions on file for this visit.

## 2018-01-18 NOTE — Progress Notes (Signed)
Safety precautions to be maintained throughout the outpatient stay will include: orient to surroundings, keep bed in low position, maintain call bell within reach at all times, provide assistance with transfer out of bed and ambulation.  

## 2018-01-19 ENCOUNTER — Encounter: Payer: Self-pay | Admitting: Family Medicine

## 2018-01-19 MED ORDER — HYDROCODONE-ACETAMINOPHEN 10-325 MG PO TABS: 1.0000 | ORAL_TABLET | Freq: Two times a day (BID) | ORAL | 0 refills | Status: DC | PRN

## 2018-01-24 LAB — COMPLIANCE DRUG ANALYSIS, UR

## 2018-02-13 ENCOUNTER — Other Ambulatory Visit: Payer: Self-pay | Admitting: Family Medicine

## 2018-02-14 ENCOUNTER — Ambulatory Visit
Payer: Medicare Other | Attending: Student in an Organized Health Care Education/Training Program | Admitting: Student in an Organized Health Care Education/Training Program

## 2018-02-14 ENCOUNTER — Other Ambulatory Visit: Payer: Self-pay

## 2018-02-14 ENCOUNTER — Encounter: Payer: Self-pay | Admitting: Student in an Organized Health Care Education/Training Program

## 2018-02-14 VITALS — BP 151/98 | HR 79 | Temp 98.2°F | Resp 16 | Ht 68.0 in | Wt 185.0 lb

## 2018-02-14 DIAGNOSIS — D700 Congenital agranulocytosis: Secondary | ICD-10-CM | POA: Diagnosis not present

## 2018-02-14 DIAGNOSIS — M79604 Pain in right leg: Secondary | ICD-10-CM | POA: Insufficient documentation

## 2018-02-14 DIAGNOSIS — G8929 Other chronic pain: Secondary | ICD-10-CM

## 2018-02-14 DIAGNOSIS — S88919A Complete traumatic amputation of unspecified lower leg, level unspecified, initial encounter: Secondary | ICD-10-CM

## 2018-02-14 DIAGNOSIS — K219 Gastro-esophageal reflux disease without esophagitis: Secondary | ICD-10-CM | POA: Insufficient documentation

## 2018-02-14 DIAGNOSIS — S48911A Complete traumatic amputation of right shoulder and upper arm, level unspecified, initial encounter: Secondary | ICD-10-CM

## 2018-02-14 DIAGNOSIS — Z79899 Other long term (current) drug therapy: Secondary | ICD-10-CM | POA: Diagnosis not present

## 2018-02-14 DIAGNOSIS — I10 Essential (primary) hypertension: Secondary | ICD-10-CM | POA: Insufficient documentation

## 2018-02-14 DIAGNOSIS — G894 Chronic pain syndrome: Secondary | ICD-10-CM | POA: Diagnosis not present

## 2018-02-14 DIAGNOSIS — G546 Phantom limb syndrome with pain: Secondary | ICD-10-CM | POA: Diagnosis not present

## 2018-02-14 DIAGNOSIS — Z89619 Acquired absence of unspecified leg above knee: Secondary | ICD-10-CM | POA: Diagnosis not present

## 2018-02-14 DIAGNOSIS — J302 Other seasonal allergic rhinitis: Secondary | ICD-10-CM | POA: Insufficient documentation

## 2018-02-14 DIAGNOSIS — E785 Hyperlipidemia, unspecified: Secondary | ICD-10-CM | POA: Diagnosis not present

## 2018-02-14 MED ORDER — HYDROCODONE-ACETAMINOPHEN 10-325 MG PO TABS: 1.0000 | ORAL_TABLET | Freq: Two times a day (BID) | ORAL | 0 refills | Status: DC | PRN

## 2018-02-14 NOTE — Progress Notes (Signed)
Patient's Name: Brandon Myers  MRN: [id number]  Referring Provider: Volney American,*  DOB: [birth year]  PCP: Volney American, PA-C  DOS: 02/14/2018  Note by: [name], MD  Service setting: Ambulatory outpatient  Specialty: Interventional Pain Management  Location: [facility] (AMB) Pain Management Facility    Patient type: Established   Primary Reason(s) for Visit: Encounter for evaluation before starting new chronic pain management plan of care (Level of risk: moderate) CC: Leg Pain (RIGHT, "phantom limb pain")  HPI  Brandon Myers is a 52 y.o. year old, male patient, who comes today for a follow-up evaluation to review the test results and decide on a treatment plan. 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 His primarily concern today is the Leg Pain (RIGHT, "phantom limb pain")  Pain Assessment: Location: Right, Upper Leg Radiating: denies Onset: More than a month ago Duration: Chronic pain Quality: Tingling, Shooting, Sharp Severity: 5 /10 (subjective, self-reported pain score)  Note: Reported level is compatible with observation.                         When using our objective Pain Scale, levels between 6 and 10/10 are said to belong in an emergency room, as it progressively worsens from a 6/10, described as severely limiting, requiring emergency care not usually available at an outpatient pain management facility. At a 6/10 level, communication becomes difficult and requires great effort. Assistance to reach the emergency department may be required. Facial flushing and profuse sweating along  with potentially dangerous increases in heart rate and blood pressure will be evident. Effect on ADL: difficulty performing daily activities Timing: Constant Modifying factors: keeping pressure off stump BP: (!) 151/98  HR: 79  Brandon Myers comes in today for a follow-up visit after his initial evaluation on 01/18/2018. Today we went over the results of his tests. These were explained in "[place]man's terms". During today's appointment we went over my diagnostic impression, as well as the proposed treatment plan.  Further details on both, my assessment(s), as well as the proposed treatment plan, please see below.  Controlled Substance Pharmacotherapy Assessment REMS (Risk Evaluation and Mitigation Strategy)  Analgesic: Hydrocodone 10 mg twice daily as needed, quantity 60/month MME/day: 20 mg/day. Pill Count: None expected due to no prior prescriptions written by our practice. Rise Patience, RN  02/14/2018  1:55 PM  Signed Safety precautions to be maintained throughout the outpatient stay will include: orient to surroundings, keep bed in low position, maintain call bell within reach at all times, provide assistance with transfer out of bed and ambulation.    Pharmacokinetics: Liberation and absorption (onset of action): WNL Distribution (time to peak effect): WNL Metabolism and excretion (duration of action): WNL         Pharmacodynamics: Desired effects: Analgesia: Brandon Myers reports >50% benefit. Functional ability: Patient reports that medication allows him to accomplish basic ADLs Clinically meaningful improvement in function (CMIF): Sustained CMIF goals met Perceived effectiveness: Described as relatively effective, allowing for increase in activities of daily living (ADL) Undesirable effects: Side-effects or Adverse reactions: None reported Monitoring: [place] PMP: Online review of the past 78-monthperiod previously conducted. Not applicable at this point since we  have not taken over the  patient's medication management yet. List of other Serum/Urine Drug Screening Test(s):  Lab Results  Component Value Date   COCAINSCRNUR Negative 03/30/2017   CANNABQUANT Negative 03/30/2017   List of all UDS test(s) done:  Lab Results  Component Value Date   SUMMARY FINAL 01/18/2018   Last UDS on record: Summary  Date Value Ref Range Status  01/18/2018 FINAL  Final    Comment:    ==================================================================== TOXASSURE COMP DRUG ANALYSIS,UR ==================================================================== Test                             Result       Flag       Units Drug Present   Hydrocodone                    1782                    ng/mg creat   Dihydrocodeine                 90                      ng/mg creat   Norhydrocodone                 1107                    ng/mg creat    Sources of hydrocodone include scheduled prescription    medications. Dihydrocodeine and norhydrocodone are expected    metabolites of hydrocodone. Dihydrocodeine is also available as a    scheduled prescription medication.   N-Desmethyltramadol            2557                    ng/mg creat    N-desmethyltramadol is an expected metabolite of tramadol. Source    of tramadol is a prescription medication.   Gabapentin                     PRESENT   Nortriptyline                  PRESENT    Nortriptyline may be administered as a prescription drug; it is    also an expected metabolite of amitriptyline.   Acetaminophen                  PRESENT   Metoprolol                     PRESENT ==================================================================== Test                      Result    Flag   Units      Ref Range   Creatinine              68               mg/dL      >=20 ==================================================================== Declared Medications:  Medication list was not  provided. ==================================================================== For clinical consultation, please call [phone]. ====================================================================    UDS interpretation: No unexpected findings.          Medication Assessment Form: Patient introduced to form today Treatment compliance: Treatment may start today if patient agrees with proposed plan. Evaluation of compliance is not  applicable at this point Risk Assessment Profile: Aberrant behavior: See initial evaluations. None observed or detected today Comorbid factors increasing risk of overdose: See initial evaluation. No additional risks detected today Medical Psychology Evaluation: Low Risk Opioid Risk Tool - 02/14/18 1353      Family History of Substance Abuse   Alcohol  Negative    Illegal Drugs  Negative    Rx Drugs  Negative      Personal History of Substance Abuse   Alcohol  Negative    Illegal Drugs  Negative    Rx Drugs  Negative      Age   Age between 42-45 years   No      History of Preadolescent Sexual Abuse   History of Preadolescent Sexual Abuse  Negative or Male      Psychological Disease   Psychological Disease  Negative    Depression  Negative      Total Score   Opioid Risk Tool Scoring  0    Opioid Risk Interpretation  Low Risk      ORT Scoring interpretation table:  Score <3 = Low Risk for SUD  Score between 4-7 = Moderate Risk for SUD  Score >8 = High Risk for Opioid Abuse   Risk Mitigation Strategies:  Patient opioid safety counseling: Completed today. Counseling provided to patient as per "Patient Counseling Document". Document signed by patient, attesting to counseling and understanding Patient-Prescriber Agreement (PPA): Obtained today.  Controlled substance notification to other providers: Written and sent today.  Pharmacologic Plan: Today we may be taking over the patient's pharmacological regimen. See below.             Laboratory  Chemistry  Inflammation Markers (CRP: Acute Phase) (ESR: Chronic Phase) No results found for: CRP, ESRSEDRATE, LATICACIDVEN                       Rheumatology Markers No results found for: RF, ANA, LABURIC, URICUR, LYMEIGGIGMAB, LYMEABIGMQN, HLAB27                      Renal Function Markers Lab Results  Component Value Date   BUN 17 01/09/2018   CREATININE 0.86 01/09/2018   BCR 20 01/09/2018   GFRAA 118 01/09/2018   GFRNONAA 102 01/09/2018                             Hepatic Function Markers Lab Results  Component Value Date   AST 37 01/09/2018   ALT 44 01/09/2018   ALBUMIN 4.7 01/09/2018   ALKPHOS 132 (H) 01/09/2018                        Electrolytes Lab Results  Component Value Date   NA 142 01/09/2018   K 4.3 01/09/2018   CL 102 01/09/2018   CALCIUM 9.8 01/09/2018                        Neuropathy Markers Lab Results  Component Value Date   HGBA1C 5.1 12/09/2016                        Bone Pathology Markers No results found for: [place], [id number], BS4967RF1, MB8466ZL9, [place], 25OHVITD2, 25OHVITD3, TESTOFREE, TESTOSTERONE  Coagulation Parameters Lab Results  Component Value Date   PLT 183 01/09/2018                        Cardiovascular Markers Lab Results  Component Value Date   HGB 14.1 01/09/2018   HCT 42.7 01/09/2018                         CA Markers No results found for: CEA, CA125, LABCA2                      Note: Lab results reviewed.   Meds   Current Outpatient Medications:  .  Filgrastim-sndz (ZARXIO) 480 MCG/0.8ML SOSY, Inject 0.8 mLs into the muscle every other day., Disp: , Rfl:  .  fluticasone (FLONASE) 50 MCG/ACT nasal spray, INSTILL 1 TO 2 SPRAYS IN EACH NOSTRIL EVERY NIGHT AT BEDTIME x 2-3 weeks, Disp: 16 g, Rfl: 12 .  gabapentin (NEURONTIN) 300 MG capsule, Take 1 capsule once day in mid day, Disp: 90 capsule, Rfl: 1 .  gabapentin (NEURONTIN) 800 MG tablet, Take 1 tablet (800 mg total) by mouth 3  (three) times daily., Disp: 270 tablet, Rfl: 3 .  HYDROcodone-acetaminophen (NORCO) 10-325 MG tablet, Take 1 tablet by mouth 2 (two) times daily as needed for severe pain. For chronic pain To last for 30 days from fill date To fill on or after: 02/14/18, 03/16/18, Disp: 60 tablet, Rfl: 0 .  loratadine (CLARITIN) 10 MG tablet, Take 1 tablet (10 mg total) by mouth daily., Disp: 90 tablet, Rfl: 3 .  losartan (COZAAR) 100 MG tablet, Take 1 tablet (100 mg total) by mouth daily., Disp: 90 tablet, Rfl: 1 .  Melatonin 10 MG TABS, Take 10 mg by mouth at bedtime., Disp: , Rfl:  .  meloxicam (MOBIC) 7.5 MG tablet, TAKE 1 TABLET(7.5 MG) BY MOUTH DAILY, Disp: 30 tablet, Rfl: 0 .  metoprolol succinate (TOPROL-XL) 50 MG 24 hr tablet, Take 1 tablet (50 mg total) by mouth daily. Take with or immediately following a meal., Disp: 90 tablet, Rfl: 1 .  nortriptyline (PAMELOR) 10 MG capsule, Take 1 capsule (10 mg total) by mouth at bedtime., Disp: 90 capsule, Rfl: 1 .  omeprazole (PRILOSEC) 40 MG capsule, Take 1 capsule (40 mg total) by mouth daily., Disp: 90 capsule, Rfl: 3 .  VENTOLIN HFA 108 (90 Base) MCG/ACT inhaler, INHALE 2 PUFFS BY MOUTH FOUR TIMES DAILY AS NEEDED, Disp: 18 g, Rfl: 12  ROS  Constitutional: Denies any fever or chills Gastrointestinal: No reported hemesis, hematochezia, vomiting, or acute GI distress Musculoskeletal: Denies any acute onset joint swelling, redness, loss of ROM, or weakness Neurological: No reported episodes of acute onset apraxia, aphasia, dysarthria, agnosia, amnesia, paralysis, loss of coordination, or loss of consciousness  Allergies  Brandon Myers is allergic to enalapril; prochlorperazine; sulfa antibiotics; benadryl [diphenhydramine]; and strawberry extract.  [place]  Drug: Brandon Myers  reports that he does not use drugs. Alcohol:  reports that he does not drink alcohol. Tobacco:  reports that he has never smoked. He has never used smokeless tobacco. Medical:  has a past medical  history of Allergy, GERD (gastroesophageal reflux disease), Hypertension, and Neutropenia, congenital ([place]). Surgical: Brandon Myers  has a past surgical history that includes Amputation; Appendectomy; Colon surgery (1994); Above elbow arm amputation (Right, 1993); and Leg amputation through knee (Right, 1993). Family: family history includes Alzheimer's disease in his mother; Asthma in his  mother; Cancer in his father, paternal grandfather, and paternal grandmother; Depression in his mother; Heart Problems in his mother; Stroke in his maternal grandmother; Thyroid disease in his mother.  Constitutional Exam  General appearance: Well nourished, well developed, and well hydrated. In no apparent acute distress Vitals:   02/14/18 1345  BP: (!) 151/98  Pulse: 79  Resp: 16  Temp: 98.2 F (36.8 C)  TempSrc: Oral  SpO2: 96%  Weight: 185 lb (83.9 kg)  Height: '5\' 8"'$  (1.727 m)   BMI Assessment: Estimated body mass index is 28.13 kg/m as calculated from the following:   Height as of this encounter: '5\' 8"'$  (1.727 m).   Weight as of this encounter: 185 lb (83.9 kg).  BMI interpretation table: BMI level Category Range association with higher incidence of chronic pain  <18 kg/m2 Underweight   18.5-24.9 kg/m2 Ideal body weight   25-29.9 kg/m2 Overweight Increased incidence by 20%  30-34.9 kg/m2 Obese (Class I) Increased incidence by 68%  35-39.9 kg/m2 Severe obesity (Class II) Increased incidence by 136%  >40 kg/m2 Extreme obesity (Class III) Increased incidence by 254%   Patient's current BMI Ideal Body weight  Body mass index is 28.13 kg/m. Ideal body weight: 68.4 kg (150 lb 12.7 oz) Adjusted ideal body weight: 74.6 kg (164 lb 7.6 oz)   BMI Readings from Last 4 Encounters:  02/14/18 28.13 kg/m  01/18/18 28.13 kg/m  01/09/18 28.34 kg/m  12/14/17 27.91 kg/m   Wt Readings from Last 4 Encounters:  02/14/18 185 lb (83.9 kg)  01/18/18 185 lb (83.9 kg)  01/09/18 184 lb 3 oz (83.5 kg)   12/14/17 181 lb 7 oz (82.3 kg)  Psych/Mental status: Alert, oriented x 3 (person, place, & time)       Eyes: PERLA Respiratory: No evidence of acute respiratory distress  Cervical Spine Area Exam  Skin & Axial Inspection: No masses, redness, edema, swelling, or associated skin lesions Alignment: Symmetrical Functional ROM: Unrestricted ROM      Stability: No instability detected Muscle Tone/Strength: Functionally intact. No obvious neuro-muscular anomalies detected. Sensory (Neurological): Unimpaired Palpation: No palpable anomalies             Upper Extremity (UE) Exam    Side: Right upper extremity  Side: Left upper extremity   Skin & Extremity Inspection: Above elbow amputation (AEA)  Skin & Extremity Inspection: Skin color, temperature, and hair growth are WNL. No peripheral edema or cyanosis. No masses, redness, swelling, asymmetry, or associated skin lesions. No contractures.   Functional ROM: Decreased ROM          Functional ROM: Unrestricted ROM           Muscle Tone/Strength: Deconditioned  Muscle Tone/Strength: Functionally intact. No obvious neuro-muscular anomalies detected.   Sensory (Neurological): Neuropathic pain pattern          Sensory (Neurological): Unimpaired           Palpation: No palpable anomalies              Palpation: No palpable anomalies               Provocative Test(s):  Phalen's test: deferred Tinel's test: deferred Apley's scratch test (touch opposite shoulder):  Action 1 (Across chest): deferred Action 2 (Overhead): deferred Action 3 (LB reach): deferred   Provocative Test(s):  Phalen's test: deferred Tinel's test: deferred Apley's scratch test (touch opposite shoulder):  Action 1 (Across chest): deferred Action 2 (Overhead): deferred Action 3 (LB reach): deferred  Thoracic Spine Area Exam  Skin & Axial Inspection: No masses, redness, or swelling Alignment: Symmetrical Functional ROM: Unrestricted ROM Stability: No  instability detected Muscle Tone/Strength: Functionally intact. No obvious neuro-muscular anomalies detected. Sensory (Neurological): Unimpaired Muscle strength & Tone: No palpable anomalies  Lumbar Spine Area Exam  Skin & Axial Inspection: No masses, redness, or swelling Alignment: Symmetrical Functional ROM: Unrestricted ROM       Stability: No instability detected Muscle Tone/Strength: Functionally intact. No obvious neuro-muscular anomalies detected. Sensory (Neurological): Unimpaired Palpation: No palpable anomalies       Provocative Tests: Lumbar Hyperextension/rotation test: deferred today       Lumbar quadrant test (Kemp's test): deferred today       Lumbar Lateral bending test: deferred today       Patrick's Maneuver: deferred today                   FABER test: deferred today       Thigh-thrust test: deferred today       S-I compression test: deferred today       S-I distraction test: deferred today        Gait & Posture Assessment  Ambulation: Limited, prosthesis in place Gait: Antalgic gait (limping) Posture: Difficulty with positional changes   Lower Extremity Exam    Side: Right lower extremity  Side: Left lower extremity  Stability: No instability observed          Stability: No instability observed          Skin & Extremity Inspection: Above knee amputation (AKA)  Skin & Extremity Inspection: Skin color, temperature, and hair growth are WNL. No peripheral edema or cyanosis. No masses, redness, swelling, asymmetry, or associated skin lesions. No contractures.  Functional ROM: Decreased ROM                  Functional ROM: Unrestricted ROM                  Muscle Tone/Strength: Functionally intact. No obvious neuro-muscular anomalies detected.  Muscle Tone/Strength: Functionally intact. No obvious neuro-muscular anomalies detected.  Sensory (Neurological): Neuropathic pain pattern  Sensory (Neurological): Unimpaired  Palpation: No palpable anomalies   Palpation: No palpable anomalies   Assessment & Plan  Primary Diagnosis & Pertinent Problem List: The primary encounter diagnosis was Chronic pain syndrome. 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  Visit Diagnosis: 1. Chronic pain syndrome   2. Amputation of arm, right ([place])   3. 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 Congenital neutropenia ([facility])   5. Phantom limb pain ([place])   6. Chronic pain of right lower extremity     General Recommendations: The pain condition that the patient suffers from is best treated with a multidisciplinary approach that involves an increase in physical activity to prevent de-conditioning and worsening of the pain cycle, as well as psychological counseling (formal and/or informal) to address the co-morbid psychological affects of pain. Treatment will often involve judicious use of pain medications and interventional procedures to decrease the pain, allowing the patient to participate in the physical activity that will ultimately produce long-lasting pain reductions. The goal of the multidisciplinary approach is to return the patient to a higher level of overall function and to restore their ability to perform activities of daily living.  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  Patient also has a history of congenital neutropenia.  Patient does experience phantom pain in both of his amputated extremities.  His current medications include 800 mg of gabapentin 3 times a day plus an extra dose of 200 mg equaling a total dose of 2700 mg.  Patient also continues his nortriptyline 10 mg nightly.  Patient was previously on tramadol 50 mg twice daily but states that the medication started to become ineffective especially since his  phantom limb pain has worsened.  Patient's urine drug screen was completed and appropriate.  Today we will prescribe the patient hydrocodone 10 mg twice daily as needed, quantity 60/month.  Patient will complete opioid contract with our clinic.  Plan: -Opioid contract -Prescription for hydrocodone as below -Continue gabapentin and nortriptyline as prescribed   Plan of Care  Pharmacotherapy (Medications Ordered): Meds ordered this encounter  Medications  . DISCONTD: HYDROcodone-acetaminophen (NORCO) 10-325 MG tablet    Sig: Take 1 tablet by mouth 2 (two) times daily as needed for severe pain. For chronic pain To last for 30 days from fill date To fill on or after: 02/14/18, 03/16/18    Dispense:  60 tablet    Refill:  0  . HYDROcodone-acetaminophen (NORCO) 10-325 MG tablet    Sig: Take 1 tablet by mouth 2 (two) times daily as needed for severe pain. For chronic pain To last for 30 days from fill date To fill on or after: 02/14/18, 03/16/18    Dispense:  60 tablet    Refill:  0   Lab-work, procedure(s), and/or referral(s): No orders of the defined types were placed in this encounter.   Pharmacological management options:  Opioid Analgesics: We'll take over management today. See above orders Membrane stabilizer: Continue gabapentin daily dose 2700 mg (800 mill grams 3 times daily +300 mg) can consider Cymbalta, Lyrica in the future.  Muscle relaxant: To be determined at a later time  NSAID: Medically contraindicated  Other analgesic(s): To be determined at a later time     Time Note: Greater than 50% of the 25 minute(s) of face-to-face time spent with Mr. Harty, was spent in counseling/coordination of care regarding: the appropriate use of the pain scale, opioid tolerance, Mr. Jackson's primary cause of pain, the treatment plan, medication side effects, the opioid analgesic risks and possible complications, the appropriate use of his medications, realistic expectations, the goals of pain  management (increased in functionality), the medication agreement and the patient's responsibilities when it comes to controlled substances.  Provider-requested follow-up: Return in about 8 weeks (around 04/11/2018) for Medication Management.  Future Appointments  Date Time Provider [place]  02/15/2018  1:15 PM Valerie Roys, DO [facility]  03/09/2018  1:00 PM CFP NURSE HEALTH ADVISOR [facility]  03/09/2018  2:00 PM Nyra Capes [facility]  04/11/2018 12:30 PM [name], MD [facility] None    Primary Care Physician: Volney American, PA-C Location: [facility] Outpatient Pain Management Facility Note by: [name], M.D Date: 02/14/2018; Time: 5:05 PM  Patient Instructions  1. Sign opioid agreement 2. Rx for 2 months of Hydrocodone. Continue Gabapentin and Nortriptyline as prescribed

## 2018-02-14 NOTE — Telephone Encounter (Signed)
Mobic 7.5 mg refill request  LOV 01/09/18 with Dr. Chanda BusingJohnson  [facility] [phone] [address]

## 2018-02-14 NOTE — Progress Notes (Signed)
Safety precautions to be maintained throughout the outpatient stay will include: orient to surroundings, keep bed in low position, maintain call bell within reach at all times, provide assistance with transfer out of bed and ambulation.  

## 2018-02-14 NOTE — Patient Instructions (Signed)
1. Sign opioid agreement 2. Rx for 2 months of Hydrocodone. Continue Gabapentin and Nortriptyline as prescribed

## 2018-02-15 ENCOUNTER — Encounter: Payer: Self-pay | Admitting: Family Medicine

## 2018-02-15 ENCOUNTER — Ambulatory Visit: Payer: Medicare Other | Admitting: Family Medicine

## 2018-02-15 VITALS — BP 118/68 | HR 67 | Temp 97.8°F | Wt 186.1 lb

## 2018-02-15 DIAGNOSIS — Z79899 Other long term (current) drug therapy: Secondary | ICD-10-CM

## 2018-02-15 DIAGNOSIS — G546 Phantom limb syndrome with pain: Secondary | ICD-10-CM

## 2018-02-15 NOTE — Progress Notes (Signed)
BP 118/68 (BP Location: Left Arm, Cuff Size: Normal)   Pulse 67   Temp 97.8 F (36.6 C)   Wt 186 lb 1 oz (84.4 kg)   SpO2 97%   BMI 28.29 kg/m    Subjective:    Patient ID: Brandon Myers, male    DOB: 07/27/1966, 52 y.o.   MRN: [id number]  HPI: Brandon Myers is a 52 y.o. male  Chief Complaint  Patient presents with  . Pain    Patient was seen at the pain clinic yesterday, and given pain medication   CHRONIC PAIN- saw pain management yesterday for a follow up after his initial evaluation on 01/18/18- Has been on gabapentin and nortriptyline, Pain management took over yesterday and is now giving him hydrocodone.  Present dose: 20mg  Morphine Equivalent Pain control status: better Duration: chronic Location: Phantom limb pain Quality: stabbing Current Pain Level: 3/10 Previous Pain Level: 5/10 Breakthrough pain: no Benefit from narcotic medications: yes What Activities task can be accomplished with current medication?: able to do his ADLs, able to sleep Interested in weaning off narcotics:no   Stool softners/OTC fiber: no  Previous pain specialty evaluation: yes Non-narcotic analgesic meds: yes Narcotic contract: yes  Relevant past medical, surgical, family and social history reviewed and updated as indicated. Interim medical history since our last visit reviewed. Allergies and medications reviewed and updated.  Review of Systems  Per HPI unless specifically indicated above     Objective:    BP 118/68 (BP Location: Left Arm, Cuff Size: Normal)   Pulse 67   Temp 97.8 F (36.6 C)   Wt 186 lb 1 oz (84.4 kg)   SpO2 97%   BMI 28.29 kg/m   Wt Readings from Last 3 Encounters:  02/15/18 186 lb 1 oz (84.4 kg)  02/14/18 185 lb (83.9 kg)  01/18/18 185 lb (83.9 kg)    Physical Exam  Constitutional: He is oriented to person, place, and time. He appears well-developed and well-nourished. No distress.  HENT:  Head: Normocephalic and atraumatic.  Right Ear: Hearing  normal.  Left Ear: Hearing normal.  Nose: Nose normal.  Eyes: Conjunctivae and lids are normal. Right eye exhibits no discharge. Left eye exhibits no discharge. No scleral icterus.  Cardiovascular: Normal rate, regular rhythm, normal heart sounds and intact distal pulses. Exam reveals no gallop and no friction rub.  No murmur heard. Pulmonary/Chest: Effort normal and breath sounds normal. No stridor. No respiratory distress. He has no wheezes. He has no rales. He exhibits no tenderness.  Musculoskeletal: Normal range of motion.  Amputation R lower leg  Neurological: He is alert and oriented to person, place, and time.  Skin: Skin is warm, dry and intact. Capillary refill takes less than 2 seconds. No rash noted. No erythema. No pallor.  Psychiatric: He has a normal mood and affect. His speech is normal and behavior is normal. Judgment and thought content normal. Cognition and memory are normal.  Nursing note and vitals reviewed.   Results for orders placed or performed in visit on 01/18/18  Compliance Drug Analysis, Ur  Result Value Ref Range   Summary FINAL       Assessment & Plan:   Problem List Items Addressed This Visit      Nervous and Auditory   Phantom limb pain (HCC) - Primary    Has been seen by pain management who have started on pain medicine. Now has a contract with them. Continue to monitor. Call with any concerns.  Other   RESOLVED: Controlled substance agreement signed    Now through Pain management. Will resolve off list.           Follow up plan: Return As scheduled.

## 2018-02-15 NOTE — Assessment & Plan Note (Signed)
Now through Pain management. Will resolve off list.

## 2018-02-15 NOTE — Assessment & Plan Note (Signed)
Has been seen by pain management who have started on pain medicine. Now has a contract with them. Continue to monitor. Call with any concerns.

## 2018-02-23 ENCOUNTER — Ambulatory Visit: Payer: Self-pay

## 2018-02-28 ENCOUNTER — Ambulatory Visit (INDEPENDENT_AMBULATORY_CARE_PROVIDER_SITE_OTHER): Payer: Medicare Other

## 2018-02-28 VITALS — BP 142/82 | HR 74 | Temp 98.3°F | Resp 17 | Ht 68.0 in | Wt 185.1 lb

## 2018-02-28 DIAGNOSIS — Z114 Encounter for screening for human immunodeficiency virus [HIV]: Secondary | ICD-10-CM

## 2018-02-28 DIAGNOSIS — Z0000 Encounter for general adult medical examination without abnormal findings: Secondary | ICD-10-CM | POA: Diagnosis not present

## 2018-02-28 NOTE — Progress Notes (Signed)
Subjective:   Brandon Myers is a 52 y.o. male who presents for Medicare Annual/Subsequent preventive examination.  Review of Systems:   Cardiac Risk Factors include: advanced age (>79men, >85 women);hypertension;male gender;dyslipidemia     Objective:    Vitals: BP (!) 142/82 (BP Location: Left Arm, Patient Position: Sitting)   Pulse 74   Temp 98.3 F (36.8 C) (Temporal)   Resp 17   Ht 5\' 8"  (1.727 m)   Wt 185 lb 1.6 oz (84 kg)   BMI 28.14 kg/m   Body mass index is 28.14 kg/m.  Advanced Directives 02/28/2018 02/14/2018 02/23/2017  Does Patient Have a Medical Advance Directive? Yes No Yes  Type of Advance Directive Living will - -  Does patient want to make changes to medical advance directive? - - Yes (MAU/Ambulatory/Procedural Areas - Information given)  Would patient like information on creating a medical advance directive? - Yes (MAU/Ambulatory/Procedural Areas - Information given) -    Tobacco Social History   Tobacco Use  Smoking Status Never Smoker  Smokeless Tobacco Never Used     Counseling given: Not Answered   Clinical Intake:  Pre-visit preparation completed: Yes  Pain : No/denies pain     Nutritional Status: BMI 25 -29 Overweight Nutritional Risks: None Diabetes: No  How often do you need to have someone help you when you read instructions, pamphlets, or other written materials from your doctor or pharmacy?: 1 - Never What is the last grade level you completed in school?: associates     Information entered by :: Marin Roberts, LPN   Past Medical History:  Diagnosis Date  . Allergy   . Controlled substance agreement signed 04/02/2017  . GERD (gastroesophageal reflux disease)   . Hypertension   . 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 ABOVE ELBOW ARM AMPUTATION Right 1993  . AMPUTATION    . APPENDECTOMY    . COLON SURGERY  1994  . LEG AMPUTATION THROUGH KNEE Right 1993   Family History    Problem Relation Age of Onset  . Heart Problems Mother   . Asthma Mother   . Depression Mother   . Thyroid disease Mother   . Alzheimer's disease Mother   . Stroke Maternal Grandmother   . Cancer Father        skin  . Cancer Paternal Grandmother   . Cancer Paternal Grandfather    Social History   Socioeconomic History  . Marital status: Married    Spouse name: Not on file  . Number of children: Not on file  . Years of education: Not on file  . Highest education level: Associate degree: academic program  Occupational History  . Not on file  Social Needs  . Financial resource strain: Not very hard  . Food insecurity:    Worry: Never true    Inability: Never true  . Transportation needs:    Medical: No    Non-medical: No  Tobacco Use  . Smoking status: Never Smoker  . Smokeless tobacco: Never Used  Substance and Sexual Activity  . Alcohol use: No    Alcohol/week: 0.0 oz  . Drug use: No  . Sexual activity: Not on file  Lifestyle  . Physical activity:    Days per week: 0 days    Minutes per session: 0 min  . Stress: Not at all  Relationships  . Social connections:    Talks on phone: More than three times a  week    Gets together: More than three times a week    Attends religious service: More than 4 times per year    Active member of club or organization: No    Attends meetings of clubs or organizations: Never    Relationship status: Married  Other Topics Concern  . Not on file  Social History Narrative  . Not on file    Outpatient Encounter Medications as of 02/28/2018  Medication Sig  . Filgrastim-sndz (ZARXIO) 480 MCG/0.8ML SOSY Inject 0.8 mLs into the muscle every other day.  . fluticasone (FLONASE) 50 MCG/ACT nasal spray INSTILL 1 TO 2 SPRAYS IN EACH NOSTRIL EVERY NIGHT AT BEDTIME x 2-3 weeks  . gabapentin (NEURONTIN) 300 MG capsule Take 1 capsule once day in mid day  . gabapentin (NEURONTIN) 800 MG tablet Take 1 tablet (800 mg total) by mouth 3 (three)  times daily.  Marland Kitchen. HYDROcodone-acetaminophen (NORCO) 10-325 MG tablet Take 1 tablet by mouth 2 (two) times daily as needed for severe pain. For chronic pain To last for 30 days from fill date To fill on or after: 02/14/18, 03/16/18  . loratadine (CLARITIN) 10 MG tablet Take 1 tablet (10 mg total) by mouth daily.  Marland Kitchen. losartan (COZAAR) 100 MG tablet Take 1 tablet (100 mg total) by mouth daily.  . Melatonin 10 MG TABS Take 10 mg by mouth at bedtime.  . meloxicam (MOBIC) 7.5 MG tablet TAKE 1 TABLET(7.5 MG) BY MOUTH DAILY  . metoprolol succinate (TOPROL-XL) 50 MG 24 hr tablet Take 1 tablet (50 mg total) by mouth daily. Take with or immediately following a meal.  . nortriptyline (PAMELOR) 10 MG capsule Take 1 capsule (10 mg total) by mouth at bedtime.  Marland Kitchen. omeprazole (PRILOSEC) 40 MG capsule Take 1 capsule (40 mg total) by mouth daily.  . VENTOLIN HFA 108 (90 Base) MCG/ACT inhaler INHALE 2 PUFFS BY MOUTH FOUR TIMES DAILY AS NEEDED   No facility-administered encounter medications on file as of 02/28/2018.     Activities of Daily Living In your present state of health, do you have any difficulty performing the following activities: 02/28/2018  Hearing? N  Vision? N  Difficulty concentrating or making decisions? N  Walking or climbing stairs? N  Dressing or bathing? N  Doing errands, shopping? N  Preparing Food and eating ? N  Using the Toilet? N  In the past six months, have you accidently leaked urine? N  Do you have problems with loss of bowel control? N  Managing your Medications? N  Managing your Finances? N  Housekeeping or managing your Housekeeping? N  Some recent data might be hidden    Patient Care Team: Particia NearingLane, Rachel Elizabeth, PA-C as PCP - General (Family Medicine) Edward JollyLateef, Bilal, MD as Consulting Physician (Pain Medicine) Wallace KellerWehbie, Robert S (Inactive) as Referring Physician   Assessment:   This is a routine wellness examination for Brandon BeeKevin.  Exercise Activities and Dietary  recommendations Current Exercise Habits: The patient does not participate in regular exercise at present, Exercise limited by: None identified  Goals    . DIET - INCREASE WATER INTAKE     Recommend drinking at least 6-8 glasses of water a day        Fall Risk Fall Risk  02/28/2018 02/14/2018 01/18/2018 03/30/2017 03/06/2017  Falls in the past year? No No No Yes Yes  Comment - - - - -  Number falls in past yr: - - - 2 or more 2 or more  Injury with  Fall? - - - No No  Risk Factor Category  - - - High Fall Risk High Fall Risk  Risk for fall due to : - - - History of fall(s);Impaired mobility -  Follow up - - - Falls evaluation completed -   Is the patient's home free of loose throw rugs in walkways, pet beds, electrical cords, etc?   yes      Grab bars in the bathroom? yes      Handrails on the stairs?   no stairs       Adequate lighting?   yes  Timed Get Up and Go Performed: Completed in 8 seconds with no use of assistive devices, steady gait. No intervention needed at this time.   Depression Screen PHQ 2/9 Scores 02/28/2018 02/14/2018 01/18/2018 03/06/2017  PHQ - 2 Score 0 0 0 0    Cognitive Function     6CIT Screen 02/28/2018 02/23/2017  What Year? 0 points 0 points  What month? 0 points 0 points  What time? 0 points 0 points  Count back from 20 0 points 0 points  Months in reverse 0 points 0 points  Repeat phrase 0 points 0 points  Total Score 0 0    Immunization History  Administered Date(s) Administered  . Influenza,inj,Quad PF,6+ Mos 04/27/2015, 05/10/2016, 05/08/2017  . Pneumococcal Conjugate-13 01/10/2017    Qualifies for Shingles Vaccine?  no  Screening Tests Health Maintenance  Topic Date Due  . HIV Screening  05/02/1983  . INFLUENZA VACCINE  03/01/2018  . TETANUS/TDAP  08/04/2024   Cancer Screenings: Lung: Low Dose CT Chest recommended if Age 50-80 years, 30 pack-year currently smoking OR have quit w/in 15years. Patient does not qualify. Colorectal: not  indicated   Additional Screenings:  Hepatitis C Screening: not indicated   HIV screening ordered for future lab draw    Plan:    I have personally reviewed and addressed the Medicare Annual Wellness questionnaire and have noted the following in the patient's chart:  A. Medical and social history B. Use of alcohol, tobacco or illicit drugs  C. Current medications and supplements D. Functional ability and status E.  Nutritional status F.  Physical activity G. Advance directives H. List of other physicians I.  Hospitalizations, surgeries, and ER visits in previous 12 months J.  Vitals K. Screenings such as hearing and vision if needed, cognitive and depression L. Referrals and appointments   In addition, I have reviewed and discussed with patient certain preventive protocols, quality metrics, and best practice recommendations. A written personalized care plan for preventive services as well as general preventive health recommendations were provided to patient.   Signed,  Marin Roberts, LPN Nurse Health Advisor   Nurse Notes:none

## 2018-02-28 NOTE — Patient Instructions (Addendum)
[name] , Thank you for taking time to come for yourMedicare Wellness Visit. I appreciate your ongoing commitment to your health goals. Please review the following plan we discussed and let me know if I can assist you in the future.   Screening recommendations/referrals: Colonoscopy: due at age 50 Recommended yearly ophthalmology/optometry visit for glaucoma screening and checkup Recommended yearly dental visit for hygiene and checkup  Vaccinations: Influenza vaccine: up to date, due 04/2018 Pneumococcal vaccine: prevnar 13 due at age 85 Tdap vaccine: up to date Shingles vaccine: eligible at age 61 for shingrix   Advanced directives: Advance directive discussed with you today. Even though you declined this today please call our office should you change your mind and we can give you the proper paperwork for you to fill out.  Conditions/risks identified: Recommend drinking at least 6-8 glasses of water a day   Next appointment: Follow up in one year for your annual wellness exam   Preventive Care 40-64 Years, Male Preventive care refers to lifestyle choices and visits with your health care provider that can promote health and wellness. What does preventive care include?  A yearly physical exam. This is also called an annual well check.  Dental exams once or twice a year.  Routine eye exams. Ask your health care provider how often you should have your eyes checked.  Personal lifestyle choices, including:  Daily care of your teeth and gums.  Regular physical activity.  Eating a healthy diet.  Avoiding tobacco and drug use.  Limiting alcohol use.  Practicing safe sex.  Taking low-dose aspirin every day starting at age 34. What happens during an annual well check? The services and screenings done by your health care provider during your annual well check will depend on your age, overall health, lifestyle risk factors, and family history of disease. Counseling  Your  health care provider may ask you questions about your:  Alcohol use.  Tobacco use.  Drug use.  Emotional well-being.  Home and relationship well-being.  Sexual activity.  Eating habits.  Work and work Astronomer. Screening  You may have the following tests or measurements:  Height, weight, and BMI.  Blood pressure.  Lipid and cholesterol levels. These may be checked every 5 years, or more frequently if you are over 41 years old.  Skin check.  Lung cancer screening. You may have this screening every year starting at age 42 if you have a 30-pack-year history of smoking and currently smoke or have quit within the past 15 years.  Fecal occult blood test (FOBT) of the stool. You may have this test every year starting at age 61.  Flexible sigmoidoscopy or colonoscopy. You may have a sigmoidoscopy every 5 years or a colonoscopy every 10 years starting at age 67.  Prostate cancer screening. Recommendations will vary depending on your family history and other risks.  Hepatitis C blood test.  Hepatitis B blood test.  Sexually transmitted disease (STD) testing.  Diabetes screening. This is done by checking your blood sugar (glucose) after you have not eaten for a while (fasting). You may have this done every 1-3 years. Discuss your test results, treatment options, and if necessary, the need for more tests with your health care provider. Vaccines  Your health care provider may recommend certain vaccines, such as:  Influenza vaccine. This is recommended every year.  Tetanus, diphtheria, and acellular pertussis (Tdap, Td) vaccine. You may need a Td booster every 10 years.  Zoster vaccine. You may need this  after age 7.  Pneumococcal 13-valent conjugate (PCV13) vaccine. You may need this if you have certain conditions and have not been vaccinated.  Pneumococcal polysaccharide (PPSV23) vaccine. You may need one or two doses if you smoke cigarettes or if you have certain  conditions. Talk to your health care provider about which screenings and vaccines you need and how often you need them. This information is not intended to replace advice given to you by your health care provider. Make sure you discuss any questions you have with your health care provider. Document Released: 08/14/2015 Document Revised: 04/06/2016 Document Reviewed: 05/19/2015 Elsevier Interactive Patient Education  2017 [employer].  Fall Prevention in the Home Falls can cause injuries. They can happen to people of all ages. There are many things you can do to make your home safe and to help prevent falls. What can I do on the outside of my home?  Regularly fix the edges of walkways and driveways and fix any cracks.  Remove anything that might make you trip as you walk through a door, such as a raised step or threshold.  Trim any bushes or trees on the path to your home.  Use bright outdoor lighting.  Clear any walking paths of anything that might make someone trip, such as rocks or tools.  Regularly check to see if handrails are loose or broken. Make sure that both sides of any steps have handrails.  Any raised decks and porches should have guardrails on the edges.  Have any leaves, snow, or ice cleared regularly.  Use sand or salt on walking paths during winter.  Clean up any spills in your garage right away. This includes oil or grease spills. What can I do in the bathroom?  Use night lights.  Install grab bars by the toilet and in the tub and shower. Do not use towel bars as grab bars.  Use non-skid mats or decals in the tub or shower.  If you need to sit down in the shower, use a plastic, non-slip stool.  Keep the floor dry. Clean up any water that spills on the floor as soon as it happens.  Remove soap buildup in the tub or shower regularly.  Attach bath mats securely with double-sided non-slip rug tape.  Do not have throw rugs and other things on the floor that  can make you trip. What can I do in the bedroom?  Use night lights.  Make sure that you have a light by your bed that is easy to reach.  Do not use any sheets or blankets that are too big for your bed. They should not hang down onto the floor.  Have a firm chair that has side arms. You can use this for support while you get dressed.  Do not have throw rugs and other things on the floor that can make you trip. What can I do in the kitchen?  Clean up any spills right away.  Avoid walking on wet floors.  Keep items that you use a lot in easy-to-reach places.  If you need to reach something above you, use a strong step stool that has a grab bar.  Keep electrical cords out of the way.  Do not use floor polish or wax that makes floors slippery. If you must use wax, use non-skid floor wax.  Do not have throw rugs and other things on the floor that can make you trip. What can I do with my stairs?  Do not leave  any items on the stairs.  Make sure that there are handrails on both sides of the stairs and use them. Fix handrails that are broken or loose. Make sure that handrails are as long as the stairways.  Check any carpeting to make sure that it is firmly attached to the stairs. Fix any carpet that is loose or worn.  Avoid having throw rugs at the top or bottom of the stairs. If you do have throw rugs, attach them to the floor with carpet tape.  Make sure that you have a light switch at the top of the stairs and the bottom of the stairs. If you do not have them, ask someone to add them for you. What else can I do to help prevent falls?  Wear shoes that:  Do not have high heels.  Have rubber bottoms.  Are comfortable and fit you well.  Are closed at the toe. Do not wear sandals.  If you use a stepladder:  Make sure that it is fully opened. Do not climb a closed stepladder.  Make sure that both sides of the stepladder are locked into place.  Ask someone to hold it for  you, if possible.  Clearly mark and make sure that you can see:  Any grab bars or handrails.  First and last steps.  Where the edge of each step is.  Use tools that help you move around (mobility aids) if they are needed. These include:  Canes.  Walkers.  Scooters.  Crutches.  Turn on the lights when you go into a dark area. Replace any light bulbs as soon as they burn out.  Set up your furniture so you have a clear path. Avoid moving your furniture around.  If any of your floors are uneven, fix them.  If there are any pets around you, be aware of where they are.  Review your medicines with your doctor. Some medicines can make you feel dizzy. This can increase your chance of falling. Ask your doctor what other things that you can do to help prevent falls. This information is not intended to replace advice given to you by your health care provider. Make sure you discuss any questions you have with your health care provider. Document Released: 05/14/2009 Document Revised: 12/24/2015 Document Reviewed: 08/22/2014 Elsevier Interactive Patient Education  2017 [employer]

## 2018-03-09 ENCOUNTER — Encounter: Payer: Self-pay | Admitting: Family Medicine

## 2018-03-09 ENCOUNTER — Other Ambulatory Visit: Payer: Self-pay

## 2018-03-09 ENCOUNTER — Ambulatory Visit: Payer: Medicare Other

## 2018-03-09 ENCOUNTER — Encounter: Payer: Medicare Other | Admitting: Family Medicine

## 2018-03-09 ENCOUNTER — Ambulatory Visit (INDEPENDENT_AMBULATORY_CARE_PROVIDER_SITE_OTHER): Payer: Medicare Other | Admitting: Family Medicine

## 2018-03-09 VITALS — BP 130/88 | HR 74 | Temp 98.4°F | Ht 68.0 in | Wt 183.0 lb

## 2018-03-09 DIAGNOSIS — I10 Essential (primary) hypertension: Secondary | ICD-10-CM

## 2018-03-09 DIAGNOSIS — K219 Gastro-esophageal reflux disease without esophagitis: Secondary | ICD-10-CM

## 2018-03-09 DIAGNOSIS — Z0000 Encounter for general adult medical examination without abnormal findings: Secondary | ICD-10-CM | POA: Diagnosis not present

## 2018-03-09 DIAGNOSIS — G546 Phantom limb syndrome with pain: Secondary | ICD-10-CM

## 2018-03-09 DIAGNOSIS — J301 Allergic rhinitis due to pollen: Secondary | ICD-10-CM

## 2018-03-09 DIAGNOSIS — E782 Mixed hyperlipidemia: Secondary | ICD-10-CM | POA: Diagnosis not present

## 2018-03-09 LAB — UA/M W/RFLX CULTURE, ROUTINE
Bilirubin, UA: NEGATIVE
Glucose, UA: NEGATIVE
Ketones, UA: NEGATIVE
LEUKOCYTES UA: NEGATIVE
Nitrite, UA: NEGATIVE
PH UA: 7 (ref 5.0–7.5)
PROTEIN UA: NEGATIVE
RBC UA: NEGATIVE
SPEC GRAV UA: 1.01 (ref 1.005–1.030)
Urobilinogen, Ur: 0.2 mg/dL (ref 0.2–1.0)

## 2018-03-09 NOTE — Progress Notes (Signed)
BP 130/88   Pulse 74   Temp 98.4 F (36.9 C) (Oral)   Ht 5\' 8"  (1.727 m)   Wt 183 lb (83 kg)   SpO2 93%   BMI 27.83 kg/m    Subjective:    Patient ID: Brandon Myers, male    DOB: 08/21/1965, 52 y.o.   MRN: [id number]  HPI: Brandon Myers is a 52 y.o. male presenting on 03/09/2018 for comprehensive medical examination. Current medical complaints include:none  Now followed by pain management for his phantom limb pain and doing very well on hydrocodone, gabapentin, and nortriptyline.   BPs not checked at home, but compliant with medication without side effects. Denies CP, SOB, dizziness, syncope.   He currently lives with: Interim Problems from his last visit: no  Depression Screen done today and results listed below:  [facility] screen [facility] 03/09/2018 02/28/2018 02/14/2018 01/18/2018 03/06/2017  Decreased Interest 0 0 0 0 0  Down, Depressed, Hopeless 0 0 0 0 0  PHQ - 2 Score 0 0 0 0 0  Altered sleeping 0 - - - -  Tired, decreased energy 1 - - - -  Change in appetite 0 - - - -  Feeling bad or failure about yourself  0 - - - -  Trouble concentrating 0 - - - -  Moving slowly or fidgety/restless 0 - - - -  Suicidal thoughts 0 - - - -  PHQ-9 Score 1 - - - -    The patient does not have a history of falls. I did not complete a risk assessment for falls. A plan of care for falls was not documented.   Past Medical History:  Past Medical History:  Diagnosis Date  . Allergy   . Controlled substance agreement signed 04/02/2017  . GERD (gastroesophageal reflux disease)   . Hypertension   . 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 ABOVE ELBOW ARM AMPUTATION Right 1993  . AMPUTATION    . APPENDECTOMY    . COLON SURGERY  1994  . LEG AMPUTATION THROUGH KNEE Right 1993    Medications:  Current Outpatient Medications on File Prior to Visit  Medication Sig  . Filgrastim-sndz (ZARXIO) 480 MCG/0.8ML SOSY Inject 0.8  mLs into the muscle every other day.  . fluticasone (FLONASE) 50 MCG/ACT nasal spray INSTILL 1 TO 2 SPRAYS IN EACH NOSTRIL EVERY NIGHT AT BEDTIME x 2-3 weeks  . gabapentin (NEURONTIN) 300 MG capsule Take 1 capsule once day in mid day  . gabapentin (NEURONTIN) 800 MG tablet Take 1 tablet (800 mg total) by mouth 3 (three) times daily.  Marland Kitchen. HYDROcodone-acetaminophen (NORCO) 10-325 MG tablet Take 1 tablet by mouth 2 (two) times daily as needed for severe pain. For chronic pain To last for 30 days from fill date To fill on or after: 02/14/18, 03/16/18  . loratadine (CLARITIN) 10 MG tablet Take 1 tablet (10 mg total) by mouth daily.  Marland Kitchen. losartan (COZAAR) 100 MG tablet Take 1 tablet (100 mg total) by mouth daily.  . Melatonin 10 MG TABS Take 10 mg by mouth at bedtime.  . meloxicam (MOBIC) 7.5 MG tablet TAKE 1 TABLET(7.5 MG) BY MOUTH DAILY  . metoprolol succinate (TOPROL-XL) 50 MG 24 hr tablet Take 1 tablet (50 mg total) by mouth daily. Take with or immediately following a meal.  . nortriptyline (PAMELOR) 10 MG capsule Take 1 capsule (10 mg total) by mouth at bedtime.  .Marland Kitchen  omeprazole (PRILOSEC) 40 MG capsule Take 1 capsule (40 mg total) by mouth daily.  . VENTOLIN HFA 108 (90 Base) MCG/ACT inhaler INHALE 2 PUFFS BY MOUTH FOUR TIMES DAILY AS NEEDED   No current facility-administered medications on file prior to visit.     Allergies:  Allergies  Allergen Reactions  . Enalapril Cough    ACEi Cough  . Prochlorperazine     Other reaction(s): Other (See Comments) As a child unable to recall  . Sulfa Antibiotics     Other reaction(s): Other (See Comments) As a child unable to recall  . Benadryl [Diphenhydramine]     Increased phantom pain  . Strawberry Extract Rash    Social History:  Social History   Socioeconomic History  . Marital status: Married    Spouse name: Not on file  . Number of children: Not on file  . Years of education: Not on file  . Highest education level: Associate degree:  academic program  Occupational History  . Not on file  Social Needs  . Financial resource strain: Not very hard  . Food insecurity:    Worry: Never true    Inability: Never true  . Transportation needs:    Medical: No    Non-medical: No  Tobacco Use  . Smoking status: Never Smoker  . Smokeless tobacco: Never Used  Substance and Sexual Activity  . Alcohol use: No    Alcohol/week: 0.0 standard drinks  . Drug use: No  . Sexual activity: Not on file  Lifestyle  . Physical activity:    Days per week: 0 days    Minutes per session: 0 min  . Stress: Not at all  Relationships  . Social connections:    Talks on phone: More than three times a week    Gets together: More than three times a week    Attends religious service: More than 4 times per year    Active member of club or organization: No    Attends meetings of clubs or organizations: Never    Relationship status: Married  . Intimate partner violence:    Fear of current or ex partner: No    Emotionally abused: No    Physically abused: No    Forced sexual activity: No  Other Topics Concern  . Not on file  Social History Narrative  . Not on file   Social History   Tobacco Use  Smoking Status Never Smoker  Smokeless Tobacco Never Used   Social History   Substance and Sexual Activity  Alcohol Use No  . Alcohol/week: 0.0 standard drinks    Family History:  Family History  Problem Relation Age of Onset  . Heart Problems Mother   . Asthma Mother   . Depression Mother   . Thyroid disease Mother   . Alzheimer's disease Mother   . Stroke Maternal Grandmother   . Cancer Father        skin  . Cancer Paternal Grandmother   . Cancer Paternal Grandfather     Past medical history, surgical history, medications, allergies, family history and social history reviewed with patient today and changes made to appropriate areas of the chart.   Review of Systems - General ROS: negative Psychological ROS:  negative Ophthalmic ROS: negative ENT ROS: negative Allergy and Immunology ROS: negative Respiratory ROS: no cough, shortness of breath, or wheezing Cardiovascular ROS: no chest pain or dyspnea on exertion Gastrointestinal ROS: no abdominal pain, change in bowel habits, or black or bloody  stools Genito-Urinary ROS: no dysuria, trouble voiding, or hematuria Musculoskeletal ROS: negative Neurological ROS: no TIA or stroke symptoms Dermatological ROS: negative All other ROS negative except what is listed above and in the HPI.      Objective:    BP 130/88   Pulse 74   Temp 98.4 F (36.9 C) (Oral)   Ht 5\' 8"  (1.727 m)   Wt 183 lb (83 kg)   SpO2 93%   BMI 27.83 kg/m   Wt Readings from Last 3 Encounters:  03/09/18 183 lb (83 kg)  02/28/18 185 lb 1.6 oz (84 kg)  02/15/18 186 lb 1 oz (84.4 kg)    Physical Exam  Constitutional: He is oriented to person, place, and time. He appears well-developed and well-nourished. No distress.  HENT:  Head: Atraumatic.  Right Ear: External ear normal.  Left Ear: External ear normal.  Nose: Nose normal.  Mouth/Throat: Oropharynx is clear and moist.  Eyes: Pupils are equal, round, and reactive to light. Conjunctivae are normal. No scleral icterus.  Neck: Normal range of motion. Neck supple.  Cardiovascular: Normal rate, regular rhythm, normal heart sounds and intact distal pulses.  No murmur heard. Pulmonary/Chest: Effort normal and breath sounds normal. No respiratory distress.  Abdominal: Soft. Bowel sounds are normal. He exhibits no distension and no mass. There is no tenderness. There is no guarding.  Genitourinary:  Genitourinary Comments: GU exam declined  Musculoskeletal: He exhibits no edema or tenderness.  S/p right UE/LE amputation  Neurological: He is alert and oriented to person, place, and time. He has normal reflexes.  Skin: Skin is warm and dry. No rash noted.  Psychiatric: He has a normal mood and affect. His behavior is normal.   Nursing note and vitals reviewed.   Results for orders placed or performed in visit on 03/09/18  CBC with Differential/Platelet  Result Value Ref Range   WBC 6.6 3.4 - 10.8 x10E3/uL   RBC 4.90 4.14 - 5.80 x10E6/uL   Hemoglobin 14.0 13.0 - 17.7 g/dL   Hematocrit 78.2 95.6 - 51.0 %   MCV 87 79 - 97 fL   MCH 28.6 26.6 - 33.0 pg   MCHC 32.7 31.5 - 35.7 g/dL   RDW 21.3 08.6 - 57.8 %   Platelets 196 150 - 450 x10E3/uL   Neutrophils 27 Not Estab. %   Lymphs 38 Not Estab. %   Monocytes 26 Not Estab. %   Eos 8 Not Estab. %   Basos 1 Not Estab. %   Neutrophils Absolute 1.7 1.4 - 7.0 x10E3/uL   Lymphocytes Absolute 2.6 0.7 - 3.1 x10E3/uL   Monocytes Absolute 1.7 (H) 0.1 - 0.9 x10E3/uL   EOS (ABSOLUTE) 0.5 (H) 0.0 - 0.4 x10E3/uL   Basophils Absolute 0.1 0.0 - 0.2 x10E3/uL   Immature Granulocytes 0 Not Estab. %   Immature Grans (Abs) 0.0 0.0 - 0.1 x10E3/uL   Hematology Comments: Note:   Comprehensive metabolic panel  Result Value Ref Range   Glucose 76 65 - 99 mg/dL   BUN 13 6 - 24 mg/dL   Creatinine, Ser 4.69 0.76 - 1.27 mg/dL   GFR calc non Af Amer 103 >59 mL/min/1.73   GFR calc Af Amer 119 >59 mL/min/1.73   BUN/Creatinine Ratio 16 9 - 20   Sodium 139 134 - 144 mmol/L   Potassium 4.5 3.5 - 5.2 mmol/L   Chloride 101 96 - 106 mmol/L   CO2 24 20 - 29 mmol/L   Calcium 9.7 8.7 - 10.2  mg/dL   Total Protein 7.2 6.0 - 8.5 g/dL   Albumin 4.7 3.5 - 5.5 g/dL   Globulin, Total 2.5 1.5 - 4.5 g/dL   Albumin/Globulin Ratio 1.9 1.2 - 2.2   Bilirubin Total 0.4 0.0 - 1.2 mg/dL   Alkaline Phosphatase 123 (H) 39 - 117 IU/L   AST 34 0 - 40 IU/L   ALT 36 0 - 44 IU/L  Lipid Panel w/o Chol/HDL Ratio  Result Value Ref Range   Cholesterol, Total 119 100 - 199 mg/dL   Triglycerides 161 (H) 0 - 149 mg/dL   HDL 20 (L) >09 mg/dL   VLDL Cholesterol Cal 59 (H) 5 - 40 mg/dL   LDL Calculated 40 0 - 99 mg/dL  UA/M w/rflx Culture, Routine  Result Value Ref Range   Specific Gravity, UA 1.010 1.005 -  1.030   pH, UA 7.0 5.0 - 7.5   Color, UA Yellow Yellow   Appearance Ur Clear Clear   Leukocytes, UA Negative Negative   Protein, UA Negative Negative/Trace   Glucose, UA Negative Negative   Ketones, UA Negative Negative   RBC, UA Negative Negative   Bilirubin, UA Negative Negative   Urobilinogen, Ur 0.2 0.2 - 1.0 mg/dL   Nitrite, UA Negative Negative      Assessment & Plan:   Problem List Items Addressed This Visit      Cardiovascular and Mediastinum   Essential hypertension - Primary    Stable and WNL, continue current regimen      Relevant Orders   CBC with Differential/Platelet (Completed)   Comprehensive metabolic panel (Completed)   UA/M w/rflx Culture, Routine (Completed)     Respiratory   Seasonal allergic rhinitis    Stable and under good control, continue current regimen        Digestive   Esophageal reflux    Stable, continue current regimen        Nervous and Auditory   Phantom limb pain (HCC)     Other   Hyperlipidemia    Currently diet controlled, recheck lipids and tx as needed      Relevant Orders   Lipid Panel w/o Chol/HDL Ratio (Completed)    Other Visit Diagnoses    Annual physical exam           Discussed aspirin prophylaxis for myocardial infarction prevention and decision was it was not indicated  LABORATORY TESTING:  Health maintenance labs ordered today as discussed above.    IMMUNIZATIONS:   - Tdap: Tetanus vaccination status reviewed: last tetanus booster within 10 years. - Influenza: Postponed to flu season  PATIENT COUNSELING:    Sexuality: Discussed sexually transmitted diseases, partner selection, use of condoms, avoidance of unintended pregnancy  and contraceptive alternatives.   Advised to avoid cigarette smoking.  I discussed with the patient that most people either abstain from alcohol or drink within safe limits (<=14/week and <=4 drinks/occasion for males, <=7/weeks and <= 3 drinks/occasion for females) and  that the risk for alcohol disorders and other health effects rises proportionally with the number of drinks per week and how often a drinker exceeds daily limits.  Discussed cessation/primary prevention of drug use and availability of treatment for abuse.   Diet: Encouraged to adjust caloric intake to maintain  or achieve ideal body weight, to reduce intake of dietary saturated fat and total fat, to limit sodium intake by avoiding high sodium foods and not adding table salt, and to maintain adequate dietary potassium and calcium preferably from fresh  fruits, vegetables, and low-fat dairy products.    stressed the importance of regular exercise  Injury prevention: Discussed safety belts, safety helmets, smoke detector, smoking near bedding or upholstery.   Dental health: Discussed importance of regular tooth brushing, flossing, and dental visits.   Follow up plan: NEXT PREVENTATIVE PHYSICAL DUE IN 1 YEAR. Return in about 6 months (around 09/09/2018) for BP, chol f/u.

## 2018-03-10 LAB — CBC WITH DIFFERENTIAL/PLATELET
Basophils Absolute: 0.1 10*3/uL (ref 0.0–0.2)
Basos: 1 %
EOS (ABSOLUTE): 0.5 10*3/uL — ABNORMAL HIGH (ref 0.0–0.4)
EOS: 8 %
HEMATOCRIT: 42.8 % (ref 37.5–51.0)
Hemoglobin: 14 g/dL (ref 13.0–17.7)
IMMATURE GRANS (ABS): 0 10*3/uL (ref 0.0–0.1)
Immature Granulocytes: 0 %
LYMPHS: 38 %
Lymphocytes Absolute: 2.6 10*3/uL (ref 0.7–3.1)
MCH: 28.6 pg (ref 26.6–33.0)
MCHC: 32.7 g/dL (ref 31.5–35.7)
MCV: 87 fL (ref 79–97)
MONOCYTES: 26 %
Monocytes Absolute: 1.7 10*3/uL — ABNORMAL HIGH (ref 0.1–0.9)
NEUTROS ABS: 1.7 10*3/uL (ref 1.4–7.0)
Neutrophils: 27 %
Platelets: 196 10*3/uL (ref 150–450)
RBC: 4.9 x10E6/uL (ref 4.14–5.80)
RDW: 13.3 % (ref 12.3–15.4)
WBC: 6.6 10*3/uL (ref 3.4–10.8)

## 2018-03-10 LAB — LIPID PANEL W/O CHOL/HDL RATIO
Cholesterol, Total: 119 mg/dL (ref 100–199)
HDL: 20 mg/dL — ABNORMAL LOW (ref >39)
LDL Calculated: 40 mg/dL (ref 0–99)
TRIGLYCERIDES: 297 mg/dL — AB (ref 0–149)
VLDL Cholesterol Cal: 59 mg/dL — ABNORMAL HIGH (ref 5–40)

## 2018-03-10 LAB — COMPREHENSIVE METABOLIC PANEL
A/G RATIO: 1.9 (ref 1.2–2.2)
ALK PHOS: 123 IU/L — AB (ref 39–117)
ALT: 36 IU/L (ref 0–44)
AST: 34 IU/L (ref 0–40)
Albumin: 4.7 g/dL (ref 3.5–5.5)
BILIRUBIN TOTAL: 0.4 mg/dL (ref 0.0–1.2)
BUN/Creatinine Ratio: 16 (ref 9–20)
BUN: 13 mg/dL (ref 6–24)
CHLORIDE: 101 mmol/L (ref 96–106)
CO2: 24 mmol/L (ref 20–29)
Calcium: 9.7 mg/dL (ref 8.7–10.2)
Creatinine, Ser: 0.83 mg/dL (ref 0.76–1.27)
GFR calc Af Amer: 119 mL/min/{1.73_m2} (ref >59)
GFR calc non Af Amer: 103 mL/min/{1.73_m2} (ref >59)
Globulin, Total: 2.5 g/dL (ref 1.5–4.5)
Glucose: 76 mg/dL (ref 65–99)
POTASSIUM: 4.5 mmol/L (ref 3.5–5.2)
SODIUM: 139 mmol/L (ref 134–144)
Total Protein: 7.2 g/dL (ref 6.0–8.5)

## 2018-03-12 NOTE — Assessment & Plan Note (Signed)
Stable and WNL, continue current regimen 

## 2018-03-12 NOTE — Assessment & Plan Note (Signed)
Currently diet controlled, recheck lipids and tx as needed

## 2018-03-12 NOTE — Assessment & Plan Note (Signed)
Stable, continue current regimen 

## 2018-03-12 NOTE — Assessment & Plan Note (Signed)
Stable and under good control, continue current regimen 

## 2018-03-12 NOTE — Patient Instructions (Signed)
Follow up in 6 months 

## 2018-04-11 ENCOUNTER — Encounter: Payer: Self-pay | Admitting: Student in an Organized Health Care Education/Training Program

## 2018-04-11 ENCOUNTER — Other Ambulatory Visit: Payer: Self-pay

## 2018-04-11 ENCOUNTER — Ambulatory Visit
Payer: Medicare Other | Attending: Student in an Organized Health Care Education/Training Program | Admitting: Student in an Organized Health Care Education/Training Program

## 2018-04-11 VITALS — BP 121/94 | HR 83 | Temp 98.1°F | Resp 16 | Ht 68.0 in | Wt 168.0 lb

## 2018-04-11 DIAGNOSIS — Z79899 Other long term (current) drug therapy: Secondary | ICD-10-CM | POA: Diagnosis not present

## 2018-04-11 DIAGNOSIS — M79604 Pain in right leg: Secondary | ICD-10-CM

## 2018-04-11 DIAGNOSIS — Z7951 Long term (current) use of inhaled steroids: Secondary | ICD-10-CM | POA: Insufficient documentation

## 2018-04-11 DIAGNOSIS — Z89619 Acquired absence of unspecified leg above knee: Secondary | ICD-10-CM | POA: Diagnosis not present

## 2018-04-11 DIAGNOSIS — S88919A Complete traumatic amputation of unspecified lower leg, level unspecified, initial encounter: Secondary | ICD-10-CM

## 2018-04-11 DIAGNOSIS — G546 Phantom limb syndrome with pain: Secondary | ICD-10-CM | POA: Diagnosis not present

## 2018-04-11 DIAGNOSIS — E785 Hyperlipidemia, unspecified: Secondary | ICD-10-CM | POA: Insufficient documentation

## 2018-04-11 DIAGNOSIS — Z89211 Acquired absence of right upper limb below elbow: Secondary | ICD-10-CM | POA: Diagnosis not present

## 2018-04-11 DIAGNOSIS — G894 Chronic pain syndrome: Secondary | ICD-10-CM

## 2018-04-11 DIAGNOSIS — I10 Essential (primary) hypertension: Secondary | ICD-10-CM | POA: Insufficient documentation

## 2018-04-11 DIAGNOSIS — D700 Congenital agranulocytosis: Secondary | ICD-10-CM | POA: Diagnosis not present

## 2018-04-11 DIAGNOSIS — J302 Other seasonal allergic rhinitis: Secondary | ICD-10-CM | POA: Diagnosis not present

## 2018-04-11 DIAGNOSIS — G8929 Other chronic pain: Secondary | ICD-10-CM

## 2018-04-11 DIAGNOSIS — K219 Gastro-esophageal reflux disease without esophagitis: Secondary | ICD-10-CM | POA: Diagnosis not present

## 2018-04-11 DIAGNOSIS — S48911A Complete traumatic amputation of right shoulder and upper arm, level unspecified, initial encounter: Secondary | ICD-10-CM | POA: Diagnosis not present

## 2018-04-11 MED ORDER — HYDROCODONE-ACETAMINOPHEN 10-325 MG PO TABS: 1.0000 | ORAL_TABLET | Freq: Two times a day (BID) | ORAL | 0 refills | Status: DC | PRN

## 2018-04-11 NOTE — Progress Notes (Signed)
Patient's Name: Brandon Myers  MRN: [id number]  Referring Provider: Volney American,*  DOB: [birth year]  PCP: Volney American, PA-C  DOS: 04/11/2018  Note by: [name], MD  Service setting: Ambulatory outpatient  Specialty: Interventional Pain Management  Location: [facility] (AMB) Pain Management Facility    Patient type: Established   Primary Reason(s) for Visit: Encounter for prescription drug management. (Level of risk: moderate)  CC: Leg Pain (right, phantom) and Arm Pain (right, phantom)  HPI  Brandon Myers is a 52 y.o. year old, male patient, who comes today for a medication management evaluation. 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 His primarily concern today is the Leg Pain (right, phantom) and Arm Pain (right, phantom)  Pain Assessment: Location: Right, Upper Leg Radiating: denies Onset: More than a month ago Duration: Chronic pain Quality: Tingling, Shooting, Sharp Severity: 5 /10 (subjective, self-reported pain score)  Note: Reported level is compatible with observation.                         When using our objective Pain Scale, levels between 6 and 10/10 are said to belong in an emergency room, as it progressively worsens from a 6/10, described as severely limiting, requiring emergency care not usually available at an outpatient pain management facility. At a 6/10 level, communication becomes difficult and requires great effort. Assistance to reach the emergency department may be required. Facial flushing and profuse sweating along with potentially dangerous increases in heart rate and blood pressure will be  evident. Effect on ADL: sometimes difficult to perform daily activities Timing: Constant Modifying factors: having prosthetic on leg helps BP: (!) 121/94  HR: 83  Brandon Myers was last scheduled for an appointment on 02/14/2018 for medication management. During today's appointment we reviewed Brandon Myers's chronic pain status, as well as his outpatient medication regimen.  The patient  reports that he does not use drugs. His body mass index is 25.54 kg/m.  Further details on both, my assessment(s), as well as the proposed treatment plan, please see below.  Controlled Substance Pharmacotherapy Assessment REMS (Risk Evaluation and Mitigation Strategy)  Analgesic: Hydrocodone 10 mg twice daily as needed, quantity 60/month MME/day: 20 mg/day. Rise Patience, RN  04/11/2018 12:40 PM  Signed Nursing Pain Medication Assessment:  Safety precautions to be maintained throughout the outpatient stay will include: orient to surroundings, keep bed in low position, maintain call bell within reach at all times, provide assistance with transfer out of bed and ambulation.  Medication Inspection Compliance: Pill count conducted under aseptic conditions, in front of the patient. Neither the pills nor the bottle was removed from the patient's sight at any time. Once count was completed pills were immediately returned to the patient in their original bottle.  Medication: Hydrocodone/APAP Pill/Patch Count: 17 of 60 pills remain Pill/Patch Appearance: Markings consistent with prescribed medication Bottle Appearance: Standard pharmacy container. Clearly labeled. Filled Date: 8 / 17 / 2019 Last Medication intake:  Yesterday   Pharmacokinetics: Liberation and absorption (onset of action): WNL Distribution (time to peak effect): WNL Metabolism and excretion (duration of action): WNL         Pharmacodynamics: Desired effects: Analgesia: Brandon Myers reports >50% benefit. Functional ability:  Patient reports that  medication allows him to accomplish basic ADLs Clinically meaningful improvement in function (CMIF): Sustained CMIF goals met Perceived effectiveness: Described as relatively effective, allowing for increase in activities of daily living (ADL) Undesirable effects: Side-effects or Adverse reactions: None reported Monitoring: [place] PMP: Online review of the past 12-monthperiod conducted. Compliant with practice rules and regulations Last UDS on record: Summary  Date Value Ref Range Status  01/18/2018 FINAL  Final    Comment:    ==================================================================== TOXASSURE COMP DRUG ANALYSIS,UR ==================================================================== Test                             Result       Flag       Units Drug Present   Hydrocodone                    1782                    ng/mg creat   Dihydrocodeine                 90                      ng/mg creat   Norhydrocodone                 1107                    ng/mg creat    Sources of hydrocodone include scheduled prescription    medications. Dihydrocodeine and norhydrocodone are expected    metabolites of hydrocodone. Dihydrocodeine is also available as a    scheduled prescription medication.   N-Desmethyltramadol            2557                    ng/mg creat    N-desmethyltramadol is an expected metabolite of tramadol. Source    of tramadol is a prescription medication.   Gabapentin                     PRESENT   Nortriptyline                  PRESENT    Nortriptyline may be administered as a prescription drug; it is    also an expected metabolite of amitriptyline.   Acetaminophen                  PRESENT   Metoprolol                     PRESENT ==================================================================== Test                      Result    Flag   Units      Ref Range   Creatinine              68               mg/dL       >=20 ==================================================================== Declared Medications:  Medication list was not provided. ==================================================================== For clinical consultation, please call ([phone] ====================================================================    UDS interpretation: Compliant          Medication Assessment Form: Reviewed. Patient indicates being compliant with therapy Treatment compliance: Compliant Risk Assessment Profile: Aberrant behavior: See prior evaluations. None  observed or detected today Comorbid factors increasing risk of overdose: See prior notes. No additional risks detected today Opioid risk tool (ORT) (Total Score): 0 Personal History of Substance Abuse (SUD-Substance use disorder):  Alcohol: Negative  Illegal Drugs: Negative  Rx Drugs: Negative  ORT Risk Level calculation: Low Risk Risk of substance use disorder (SUD): Low Opioid Risk Tool - 04/11/18 1238      Family History of Substance Abuse   Alcohol  Negative    Illegal Drugs  Negative    Rx Drugs  Negative      Personal History of Substance Abuse   Alcohol  Negative    Illegal Drugs  Negative    Rx Drugs  Negative      Age   Age between 31-45 years   No      History of Preadolescent Sexual Abuse   History of Preadolescent Sexual Abuse  Negative or Male      Psychological Disease   Psychological Disease  Negative    Depression  Negative      Total Score   Opioid Risk Tool Scoring  0    Opioid Risk Interpretation  Low Risk      ORT Scoring interpretation table:  Score <3 = Low Risk for SUD  Score between 4-7 = Moderate Risk for SUD  Score >8 = High Risk for Opioid Abuse   Risk Mitigation Strategies:  Patient Counseling: Covered Patient-Prescriber Agreement (PPA): Present and active  Notification to other healthcare providers: Done  Pharmacologic Plan: No change in therapy, at this time.             Laboratory  Chemistry  Inflammation Markers (CRP: Acute Phase) (ESR: Chronic Phase) No results found for: CRP, ESRSEDRATE, LATICACIDVEN                       Rheumatology Markers No results found for: RF, ANA, LABURIC, URICUR, LYMEIGGIGMAB, LYMEABIGMQN, HLAB27                      Renal Function Markers Lab Results  Component Value Date   BUN 13 03/09/2018   CREATININE 0.83 03/09/2018   BCR 16 03/09/2018   GFRAA 119 03/09/2018   GFRNONAA 103 03/09/2018                             Hepatic Function Markers Lab Results  Component Value Date   AST 34 03/09/2018   ALT 36 03/09/2018   ALBUMIN 4.7 03/09/2018   ALKPHOS 123 (H) 03/09/2018                        Electrolytes Lab Results  Component Value Date   NA 139 03/09/2018   K 4.5 03/09/2018   CL 101 03/09/2018   CALCIUM 9.7 03/09/2018                        Neuropathy Markers Lab Results  Component Value Date   HGBA1C 5.1 12/09/2016                        CNS Tests No results found for: SDES, GRAMSTAIN, CULT, COLORCSF, APPEARCSF, RBCCOUNTCSF, WBCCSF, POLYSCSF, LYMPHSCSF, EOSCSF, PROTEINCSF, GLUCCSF, JCVIRUS, CSFOLI, IGGCSF, IGGALB, IGGIND  Bone Pathology Markers No results found for: VD25OH, [id number], [id number], [id number], 25OHVITD1, 25OHVITD2, 25OHVITD3, TESTOFREE, TESTOSTERONE                       Coagulation Parameters Lab Results  Component Value Date   PLT 196 03/09/2018                        Cardiovascular Markers Lab Results  Component Value Date   HGB 14.0 03/09/2018   HCT 42.8 03/09/2018                         CA Markers No results found for: CEA, CA125, LABCA2                      Note: Lab results reviewed.    Meds   Current Outpatient Medications:  .  Filgrastim-sndz (ZARXIO) 480 MCG/0.8ML SOSY, Inject 0.8 mLs into the muscle every other day., Disp: , Rfl:  .  fluticasone (FLONASE) 50 MCG/ACT nasal spray, INSTILL 1 TO 2 SPRAYS IN EACH NOSTRIL EVERY NIGHT AT BEDTIME x 2-3  weeks, Disp: 16 g, Rfl: 12 .  gabapentin (NEURONTIN) 300 MG capsule, Take 1 capsule once day in mid day, Disp: 90 capsule, Rfl: 1 .  gabapentin (NEURONTIN) 800 MG tablet, Take 1 tablet (800 mg total) by mouth 3 (three) times daily., Disp: 270 tablet, Rfl: 3 .  HYDROcodone-acetaminophen (NORCO) 10-325 MG tablet, Take 1 tablet by mouth 2 (two) times daily as needed for severe pain. For chronic pain To last for 30 days from fill date To fill on or after: 04/16/18, 05/15/18, 06/14/18, Disp: 60 tablet, Rfl: 0 .  loratadine (CLARITIN) 10 MG tablet, Take 1 tablet (10 mg total) by mouth daily., Disp: 90 tablet, Rfl: 3 .  losartan (COZAAR) 100 MG tablet, Take 1 tablet (100 mg total) by mouth daily., Disp: 90 tablet, Rfl: 1 .  Melatonin 10 MG TABS, Take 10 mg by mouth at bedtime., Disp: , Rfl:  .  meloxicam (MOBIC) 7.5 MG tablet, TAKE 1 TABLET(7.5 MG) BY MOUTH DAILY, Disp: 30 tablet, Rfl: 0 .  metoprolol succinate (TOPROL-XL) 50 MG 24 hr tablet, Take 1 tablet (50 mg total) by mouth daily. Take with or immediately following a meal., Disp: 90 tablet, Rfl: 1 .  nortriptyline (PAMELOR) 10 MG capsule, Take 1 capsule (10 mg total) by mouth at bedtime., Disp: 90 capsule, Rfl: 1 .  omeprazole (PRILOSEC) 40 MG capsule, Take 1 capsule (40 mg total) by mouth daily., Disp: 90 capsule, Rfl: 3 .  VENTOLIN HFA 108 (90 Base) MCG/ACT inhaler, INHALE 2 PUFFS BY MOUTH FOUR TIMES DAILY AS NEEDED, Disp: 18 g, Rfl: 12  ROS  Constitutional: Denies any fever or chills Gastrointestinal: No reported hemesis, hematochezia, vomiting, or acute GI distress Musculoskeletal: Denies any acute onset joint swelling, redness, loss of ROM, or weakness Neurological: No reported episodes of acute onset apraxia, aphasia, dysarthria, agnosia, amnesia, paralysis, loss of coordination, or loss of consciousness  Allergies  Mr. Forge is allergic to enalapril; prochlorperazine; sulfa antibiotics; benadryl [diphenhydramine]; and strawberry  extract.  [place]  Drug: Mr. Cassedy  reports that he does not use drugs. Alcohol:  reports that he does not drink alcohol. Tobacco:  reports that he has never smoked. He has never used smokeless tobacco. Medical:  has a past medical history of Allergy, Controlled substance agreement signed (04/02/2017), GERD (gastroesophageal reflux disease), Hypertension,  and Neutropenia, congenital ([place]). Surgical: Mr. Rafalski  has a past surgical history that includes Amputation; Appendectomy; Colon surgery (1994); Above elbow arm amputation (Right, 1993); and Leg amputation through knee (Right, 1993). Family: family history includes Alzheimer's disease in his mother; Asthma in his mother; Cancer in his father, paternal grandfather, and paternal grandmother; Depression in his mother; Heart Problems in his mother; Stroke in his maternal grandmother; Thyroid disease in his mother.  Constitutional Exam  General appearance: Well nourished, well developed, and well hydrated. In no apparent acute distress Vitals:   04/11/18 1231  BP: (!) 121/94  Pulse: 83  Resp: 16  Temp: 98.1 F (36.7 C)  TempSrc: Oral  SpO2: 97%  Weight: 168 lb (76.2 kg)  Height: _0  (1.727 m)   BMI Assessment: Estimated body mass index is 25.54 kg/m as calculated from the following:   Height as of this encounter: _1  (1.727 m).   Weight as of this encounter: 168 lb (76.2 kg).  BMI interpretation table: BMI level Category Range association with higher incidence of chronic pain  <18 kg/m2 Underweight   18.5-24.9 kg/m2 Ideal body weight   25-29.9 kg/m2 Overweight Increased incidence by 20%  30-34.9 kg/m2 Obese (Class I) Increased incidence by 68%  35-39.9 kg/m2 Severe obesity (Class II) Increased incidence by 136%  >40 kg/m2 Extreme obesity (Class III) Increased incidence by 254%   Patient's current BMI Ideal Body weight  Body mass index is 25.54 kg/m. Ideal body weight: 68.4 kg (150 lb 12.7 oz) Adjusted ideal body weight: 71.5 kg  (157 lb 10.8 oz)   BMI Readings from Last 4 Encounters:  04/11/18 25.54 kg/m  03/09/18 27.83 kg/m  02/28/18 28.14 kg/m  02/15/18 28.29 kg/m   Wt Readings from Last 4 Encounters:  04/11/18 168 lb (76.2 kg)  03/09/18 183 lb (83 kg)  02/28/18 185 lb 1.6 oz (84 kg)  02/15/18 186 lb 1 oz (84.4 kg)  Psych/Mental status: Alert, oriented x 3 (person, place, & time)       Eyes: PERLA Respiratory: No evidence of acute respiratory distress  Cervical Spine Area Exam  Skin & Axial Inspection: No masses, redness, edema, swelling, or associated skin lesions Alignment: Symmetrical Functional ROM: Unrestricted ROM      Stability: No instability detected Muscle Tone/Strength: Functionally intact. No obvious neuro-muscular anomalies detected. Sensory (Neurological): Unimpaired Palpation: No palpable anomalies               Upper Extremity (UE) Exam    Side:Right upper extremity  Side:Left upper extremity   Skin & Extremity Inspection:Above elbow amputation (AEA)  Skin & Extremity Inspection:Skin color, temperature, and hair growth are WNL. No peripheral edema or cyanosis. No masses, redness, swelling, asymmetry, or associated skin lesions. No contractures.   Functional ZHG:DJMEQASTM ROM  Functional HDQ:QIWLNLGXQJJH ROM   Muscle Tone/Strength:Deconditioned  Muscle Tone/Strength:Functionally intact. No obvious neuro-muscular anomalies detected.   Sensory (Neurological):Neuropathic pain pattern  Sensory (Neurological):Unimpaired   Palpation:No palpable anomalies  Palpation:No palpable anomalies   Provocative Test(s): Phalen's test:deferred Tinel's test:deferred Apley's scratch test (touch opposite shoulder): Action 1 (Across chest):deferred Action 2 (Overhead):deferred Action 3 (LB reach):deferred   Provocative Test(s): Phalen's test:deferred Tinel's test:deferred Apley's scratch test (touch  opposite shoulder): Action 1 (Across chest):deferred Action 2 (Overhead):deferred Action 3 (LB reach):deferred     Thoracic Spine Area Exam  Skin & Axial Inspection:No masses, redness, or swelling Alignment:Symmetrical Functional ERD:EYCXKGYJEHUD ROM Stability:No instability detected Muscle Tone/Strength:Functionally intact. No obvious neuro-muscular anomalies detected. Sensory (Neurological):Unimpaired Muscle strength &  Tone:No palpable anomalies  Lumbar Spine Area Exam  Skin & Axial Inspection:No masses, redness, or swelling Alignment:Symmetrical Functional ZOX:WRUEAVWUJWJX ROM Stability:No instability detected Muscle Tone/Strength:Functionally intact. No obvious neuro-muscular anomalies detected. Sensory (Neurological):Unimpaired Palpation:No palpable anomalies Provocative Tests: Lumbar Hyperextension/rotation test:deferred today Lumbar quadrant test (Kemp's test):deferred today Lumbar Lateral bending test:deferred today Patrick's Maneuver:deferred today FABER test:deferred today Thigh-thrust test:deferred today S-I compression test:deferred today S-I distraction test:deferred today  Gait & Posture Assessment  Ambulation:Limited, prosthesis in place Gait:Antalgic gait (limping) Posture:Difficulty with positional changes  Lower Extremity Exam    Side:Right lower extremity  Side:Left lower extremity  Stability:No instability observed  Stability:No instability observed  Skin & Extremity Inspection:Above knee amputation (AKA)  Skin & Extremity Inspection:Skin color, temperature, and hair growth are WNL. No peripheral edema or cyanosis. No masses, redness, swelling, asymmetry, or associated skin lesions. No contractures.  Functional BJY:NWGNFAOZH ROM   Functional YQM:VHQIONGEXBMW ROM   Muscle Tone/Strength:Functionally  intact. No obvious neuro-muscular anomalies detected.  Muscle Tone/Strength:Functionally intact. No obvious neuro-muscular anomalies detected.  Sensory (Neurological):Neuropathic pain pattern  Sensory (Neurological):Unimpaired  Palpation:No palpable anomalies  Palpation:No palpable anomalies     Assessment  Primary Diagnosis & Pertinent Problem List: The primary encounter diagnosis was Chronic pain syndrome. 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  Status Diagnosis  Persistent Persistent Persistent 1. Chronic pain syndrome   2. Amputation of arm, right ([place])   3. 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 Phantom limb pain ([place])   5. Congenital neutropenia ([facility])   6. Chronic pain of right lower extremity      General Recommendations: The pain condition that the patient suffers from is best treated with a multidisciplinary approach that involves an increase in physical activity to prevent de-conditioning and worsening of the pain cycle, as well as psychological counseling (formal and/or informal) to address the co-morbid psychological affects of pain. Treatment will often involve judicious use of pain medications and interventional procedures to decrease the pain, allowing the patient to participate in the physical activity that will ultimately produce long-lasting pain reductions. The goal of the multidisciplinary approach is to return the patient to a higher level of overall function and to restore their ability to perform activities of daily living.  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 Patient also has a history of congenital neutropenia. Patient does experience phantom pain in both of his amputated extremities. His current medications  include 800 mg of gabapentin 3 times a day plus an extra dose of 200 mg equaling a total dose of 2700 mg.  Patient also continues his nortriptyline 10 mg nightly.  Patient was previously on tramadol 50 mg twice daily but states that the medication started to become ineffective especially since his phantom limb pain has worsened  Today we will refill the patient's hydrocodone as below. PMP checked and ok, UDS up to date. -Continue gabapentin and nortriptyline as prescribed  Plan of Care  Pharmacotherapy (Medications Ordered): Meds ordered this encounter  Medications  . DISCONTD: HYDROcodone-acetaminophen (NORCO) 10-325 MG tablet    Sig: Take 1 tablet by mouth 2 (two) times daily as needed for severe pain. For chronic pain To last for 30 days from fill date To fill on or after: 04/16/18, 05/15/18, 06/14/18    Dispense:  60 tablet    Refill:  0  . DISCONTD: HYDROcodone-acetaminophen (NORCO) 10-325 MG tablet    Sig: Take 1 tablet by mouth 2 (two) times daily as needed for severe pain. For chronic pain To last for 30 days from fill date To fill on or after: 04/16/18, 05/15/18, 06/14/18    Dispense:  60 tablet    Refill:  0  . HYDROcodone-acetaminophen (NORCO) 10-325 MG tablet    Sig: Take 1 tablet by mouth 2 (two) times daily as needed for severe pain. For chronic pain To last for 30 days from fill date To fill on or after: 04/16/18, 05/15/18, 06/14/18    Dispense:  60 tablet    Refill:  0   Time Note: Greater than 50% of the 25 minute(s) of face-to-face time spent with Mr. Rabalais, was spent in counseling/coordination of care regarding: the appropriate use of the pain scale, opioid tolerance, Mr. Kneebone's primary cause of pain, the treatment plan, medication side effects, the opioid analgesic risks and possible complications, the appropriate use of his medications, realistic expectations, the goals of pain management (increased in functionality), the medication agreement and the patient's  responsibilities when it comes to controlled substances.  Provider-requested follow-up: Return in about 3 months (around 07/11/2018) for Medication Management.  Future Appointments  Date Time Provider [place]  07/10/2018 11:45 AM [name], MD [facility] None    Primary Care Physician: Volney American, PA-C Location: [facility] Out[facility] Management Facility Note by: [name], M.D Date: 04/11/2018; Time: 2:49 PM  Patient Instructions  You have been given 3 rx for Hydrocodone to last until 07/14/18.

## 2018-04-11 NOTE — Progress Notes (Signed)
Nursing Pain Medication Assessment:  Safety precautions to be maintained throughout the outpatient stay will include: orient to surroundings, keep bed in low position, maintain call bell within reach at all times, provide assistance with transfer out of bed and ambulation.  Medication Inspection Compliance: Pill count conducted under aseptic conditions, in front of the patient. Neither the pills nor the bottle was removed from the patient's sight at any time. Once count was completed pills were immediately returned to the patient in their original bottle.  Medication: Hydrocodone/APAP Pill/Patch Count: 17 of 60 pills remain Pill/Patch Appearance: Markings consistent with prescribed medication Bottle Appearance: Standard pharmacy container. Clearly labeled. Filled Date: 8 / 17 / 2019 Last Medication intake:  Yesterday

## 2018-04-11 NOTE — Patient Instructions (Signed)
You have been given 3 rx for Hydrocodone to last until 07/14/18.

## 2018-04-25 ENCOUNTER — Ambulatory Visit (INDEPENDENT_AMBULATORY_CARE_PROVIDER_SITE_OTHER): Payer: Medicare Other | Admitting: Family Medicine

## 2018-04-25 ENCOUNTER — Other Ambulatory Visit: Payer: Self-pay | Admitting: Family Medicine

## 2018-04-25 ENCOUNTER — Other Ambulatory Visit: Payer: Self-pay

## 2018-04-25 ENCOUNTER — Encounter: Payer: Self-pay | Admitting: Family Medicine

## 2018-04-25 VITALS — BP 164/95 | HR 64 | Temp 97.9°F | Ht 68.0 in | Wt 184.5 lb

## 2018-04-25 DIAGNOSIS — Z23 Encounter for immunization: Secondary | ICD-10-CM | POA: Diagnosis not present

## 2018-04-25 DIAGNOSIS — S88919A Complete traumatic amputation of unspecified lower leg, level unspecified, initial encounter: Secondary | ICD-10-CM

## 2018-04-25 DIAGNOSIS — Z89619 Acquired absence of unspecified leg above knee: Secondary | ICD-10-CM

## 2018-04-25 DIAGNOSIS — I10 Essential (primary) hypertension: Secondary | ICD-10-CM | POA: Diagnosis not present

## 2018-04-25 MED ORDER — HYDROCHLOROTHIAZIDE 12.5 MG PO TABS: 12.5000 mg | ORAL_TABLET | Freq: Every day | ORAL | 1 refills | Status: DC

## 2018-04-25 NOTE — Telephone Encounter (Signed)
Pt requesting a 90 day supply of medication. Last refilled for a 30 day supply with one refill on 04/25/18.  LOV: 04/25/18 with Margit Hanksachel Lane,PA

## 2018-04-25 NOTE — Progress Notes (Addendum)
BP (!) 164/95   Pulse 64   Temp 97.9 F (36.6 C) (Oral)   Ht 5\' 8"  (1.727 m)   Wt 184 lb 8 oz (83.7 kg)   SpO2 96%   BMI 28.05 kg/m    Subjective:    Patient ID: Brandon Myers, male    DOB: 28-Jun-1966, 52 y.o.   MRN: [id number]  HPI: Brandon Myers is a 52 y.o. male  Chief Complaint  Patient presents with  . 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 The current prosthetic is causing him significant pain and is very difficult for him to put on and take off. States the clinic will fax over forms to be completed.   Does not check BPs at home. Compliant with medications, no side effects reported. Does not follow particular diet and cannot exercise due to UE/LE amputations. Currently taking metoprolol and losartan. Tried amlodipine in the past which caused swelling. Intolerant to ACEi d/t cough. Denies CP, SOB, dizziness, syncope.   Relevant past medical, surgical, family and social history reviewed and updated as indicated. Interim medical history since our last visit reviewed. Allergies and medications reviewed and updated.  Review of Systems  Per HPI unless specifically indicated above     Objective:    BP (!) 164/95   Pulse 64   Temp 97.9 F (36.6 C) (Oral)   Ht 5\' 8"  (1.727 m)   Wt 184 lb 8 oz (83.7 kg)   SpO2 96%   BMI 28.05 kg/m   Wt Readings from Last 3 Encounters:  04/25/18 184 lb 8 oz (83.7 kg)  04/11/18 168 lb (76.2 kg)  03/09/18 183 lb (83 kg)    Physical Exam  Constitutional: He is oriented to person, place, and time. He appears well-developed and well-nourished.  HENT:  Head: Atraumatic.  Eyes: Conjunctivae and EOM are normal.  Neck: Normal range of motion. Neck supple.  Cardiovascular: Normal rate and regular rhythm.  Pulmonary/Chest: Effort normal and breath sounds  normal.  Musculoskeletal: He exhibits no edema.  ROM at baseline d/t R UE/LE amputations  Neurological: He is alert and oriented to person, place, and time.  Skin: Skin is warm and dry.  Psychiatric: He has a normal mood and affect. His behavior is normal.  Nursing note and vitals reviewed.   Results for orders placed or performed in visit on 03/09/18  CBC with Differential/Platelet  Result Value Ref Range   WBC 6.6 3.4 - 10.8 x10E3/uL   RBC 4.90 4.14 - 5.80 x10E6/uL   Hemoglobin 14.0 13.0 - 17.7 g/dL   Hematocrit 84.6 96.2 - 51.0 %   MCV 87 79 - 97 fL   MCH 28.6 26.6 - 33.0 pg   MCHC 32.7 31.5 - 35.7 g/dL   RDW 95.2 84.1 - 32.4 %   Platelets 196 150 - 450 x10E3/uL   Neutrophils 27 Not Estab. %   Lymphs 38 Not Estab. %   Monocytes 26 Not Estab. %   Eos 8 Not Estab. %   Basos 1 Not Estab. %   Neutrophils Absolute 1.7 1.4 - 7.0 x10E3/uL   Lymphocytes Absolute 2.6 0.7 - 3.1 x10E3/uL   Monocytes Absolute 1.7 (H) 0.1 - 0.9 x10E3/uL   EOS (ABSOLUTE) 0.5 (H) 0.0 - 0.4 x10E3/uL   Basophils  Absolute 0.1 0.0 - 0.2 x10E3/uL   Immature Granulocytes 0 Not Estab. %   Immature Grans (Abs) 0.0 0.0 - 0.1 x10E3/uL   Hematology Comments: Note:   Comprehensive metabolic panel  Result Value Ref Range   Glucose 76 65 - 99 mg/dL   BUN 13 6 - 24 mg/dL   Creatinine, Ser 1.61 0.76 - 1.27 mg/dL   GFR calc non Af Amer 103 >59 mL/min/1.73   GFR calc Af Amer 119 >59 mL/min/1.73   BUN/Creatinine Ratio 16 9 - 20   Sodium 139 134 - 144 mmol/L   Potassium 4.5 3.5 - 5.2 mmol/L   Chloride 101 96 - 106 mmol/L   CO2 24 20 - 29 mmol/L   Calcium 9.7 8.7 - 10.2 mg/dL   Total Protein 7.2 6.0 - 8.5 g/dL   Albumin 4.7 3.5 - 5.5 g/dL   Globulin, Total 2.5 1.5 - 4.5 g/dL   Albumin/Globulin Ratio 1.9 1.2 - 2.2   Bilirubin Total 0.4 0.0 - 1.2 mg/dL   Alkaline Phosphatase 123 (H) 39 - 117 IU/L   AST 34 0 - 40 IU/L   ALT 36 0 - 44 IU/L  Lipid Panel w/o Chol/HDL Ratio  Result Value Ref Range   Cholesterol,  Total 119 100 - 199 mg/dL   Triglycerides 096 (H) 0 - 149 mg/dL   HDL 20 (L) >04 mg/dL   VLDL Cholesterol Cal 59 (H) 5 - 40 mg/dL   LDL Calculated 40 0 - 99 mg/dL  UA/M w/rflx Culture, Routine  Result Value Ref Range   Specific Gravity, UA 1.010 1.005 - 1.030   pH, UA 7.0 5.0 - 7.5   Color, UA Yellow Yellow   Appearance Ur Clear Clear   Leukocytes, UA Negative Negative   Protein, UA Negative Negative/Trace   Glucose, UA Negative Negative   Ketones, UA Negative Negative   RBC, UA Negative Negative   Bilirubin, UA Negative Negative   Urobilinogen, Ur 0.2 0.2 - 1.0 mg/dL   Nitrite, UA Negative Negative      Assessment & Plan:   Problem List Items Addressed This Visit      Cardiovascular and Mediastinum   Essential hypertension - Primary    Will add HCTZ and follow up via mychart in 2 weeks if readings still elevated. Work on [employer].         Other   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(HCC)    Await forms from [facility]       Other Visit Diagnoses    Flu vaccine need       Relevant Orders   Flu Vaccine QUAD 36+ mos IM (Completed)    Patient is a Right Transfemoral Amputee. He does not have any comorbidities that will impact his mobility or ability to function with a prosthesis. He currently has a prosthesis that is broken and not meeting his functional needs. 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 Patient is having difficulty donning his prosthesis resulting in back pain and would benefit from a new socket with an easier to donn suspension system. Patient verbally communicates a strong desire to get a new prosthesis. He is not currently using any mobility aides and is not expected to do so with a new prosthesis. He is a K3 level ambulator that spends a lot of time walking around on uneven terrain over obstacles, up and down stairs, and sometimes has to walk backwards. Patient will benefit from the continued use of microprocessor  knee technology to reduce fall risk.     Follow up plan: Return in about 4 weeks (around 05/23/2018) for BP.

## 2018-04-25 NOTE — Patient Instructions (Signed)
Follow up in 1 month   

## 2018-04-25 NOTE — Assessment & Plan Note (Signed)
Will add HCTZ and follow up via mychart in 2 weeks if readings still elevated. Work on [employer].

## 2018-04-25 NOTE — Assessment & Plan Note (Signed)
Await forms from [facility]

## 2018-05-25 ENCOUNTER — Encounter: Payer: Self-pay | Admitting: Family Medicine

## 2018-05-25 ENCOUNTER — Ambulatory Visit (INDEPENDENT_AMBULATORY_CARE_PROVIDER_SITE_OTHER): Payer: Medicare Other | Admitting: Family Medicine

## 2018-05-25 VITALS — BP 124/85 | HR 90 | Wt 184.0 lb

## 2018-05-25 DIAGNOSIS — I10 Essential (primary) hypertension: Secondary | ICD-10-CM

## 2018-05-25 NOTE — Progress Notes (Signed)
   BP 124/85   Pulse 90   Wt 184 lb (83.5 kg)   SpO2 96%   BMI 27.98 kg/m    Subjective:    Patient ID: Brandon Myers, male    DOB: Feb 24, 1966, 52 y.o.   MRN: [id number]  HPI: Brandon Myers is a 52 y.o. male  Chief Complaint  Patient presents with  . Follow-up  . Hypertension   Here today for 1 month BP check after adding HCTZ to regimen. Tolerating well, some increased urination but no other side effects. Home BPs WNL when he's checked at home, typically 120s/70s. Denies CP, SOB, HAs, dizziness.   Relevant past medical, surgical, family and social history reviewed and updated as indicated. Interim medical history since our last visit reviewed. Allergies and medications reviewed and updated.  Review of Systems  Per HPI unless specifically indicated above     Objective:    BP 124/85   Pulse 90   Wt 184 lb (83.5 kg)   SpO2 96%   BMI 27.98 kg/m   Wt Readings from Last 3 Encounters:  05/25/18 184 lb (83.5 kg)  04/25/18 184 lb 8 oz (83.7 kg)  04/11/18 168 lb (76.2 kg)    Physical Exam  Constitutional: He is oriented to person, place, and time. He appears well-developed and well-nourished. No distress.  HENT:  Head: Atraumatic.  Eyes: Conjunctivae and EOM are normal.  Neck: Normal range of motion. Neck supple.  Cardiovascular: Normal rate and regular rhythm.  Pulmonary/Chest: Effort normal and breath sounds normal.  Musculoskeletal: He exhibits no edema.  Neurological: He is alert and oriented to person, place, and time.  Skin: Skin is warm and dry.  Psychiatric: He has a normal mood and affect. His behavior is normal.  Nursing note and vitals reviewed.   Results for orders placed or performed in visit on 05/25/18  Basic Metabolic Panel (BMET)  Result Value Ref Range   Glucose 94 65 - 99 mg/dL   BUN 15 6 - 24 mg/dL   Creatinine, Ser 4.09 0.76 - 1.27 mg/dL   GFR calc non Af Amer 99 >59 mL/min/1.73   GFR calc Af Amer 115 >59 mL/min/1.73   BUN/Creatinine Ratio  17 9 - 20   Sodium 141 134 - 144 mmol/L   Potassium 4.5 3.5 - 5.2 mmol/L   Chloride 102 96 - 106 mmol/L   CO2 26 20 - 29 mmol/L   Calcium 10.0 8.7 - 10.2 mg/dL      Assessment & Plan:   Problem List Items Addressed This Visit      Cardiovascular and Mediastinum   Essential hypertension - Primary    BPs stable and WNL. Will recheck BMP today. Continue current regimen      Relevant Orders   Basic Metabolic Panel (BMET) (Completed)       Follow up plan: Return in about 6 months (around 11/24/2018) for BP, chol.

## 2018-05-26 LAB — BASIC METABOLIC PANEL
BUN / CREAT RATIO: 17 (ref 9–20)
BUN: 15 mg/dL (ref 6–24)
CO2: 26 mmol/L (ref 20–29)
Calcium: 10 mg/dL (ref 8.7–10.2)
Chloride: 102 mmol/L (ref 96–106)
Creatinine, Ser: 0.9 mg/dL (ref 0.76–1.27)
GFR, EST AFRICAN AMERICAN: 115 mL/min/{1.73_m2} (ref >59)
GFR, EST NON AFRICAN AMERICAN: 99 mL/min/{1.73_m2} (ref >59)
Glucose: 94 mg/dL (ref 65–99)
Potassium: 4.5 mmol/L (ref 3.5–5.2)
SODIUM: 141 mmol/L (ref 134–144)

## 2018-05-26 NOTE — Assessment & Plan Note (Signed)
BPs stable and WNL. Will recheck BMP today. Continue current regimen

## 2018-05-26 NOTE — Patient Instructions (Signed)
Follow up in 6 months 

## 2018-06-10 ENCOUNTER — Other Ambulatory Visit: Payer: Self-pay | Admitting: Family Medicine

## 2018-06-10 DIAGNOSIS — I10 Essential (primary) hypertension: Secondary | ICD-10-CM

## 2018-06-11 NOTE — Telephone Encounter (Signed)
Requested Prescriptions  Pending Prescriptions Disp Refills  . losartan (COZAAR) 100 MG tablet [Pharmacy Med Name: LOSARTAN 100MG  TABLETS] 90 tablet 1    Sig: TAKE 1 TABLET(100 MG) BY MOUTH DAILY     Cardiovascular:  Angiotensin Receptor Blockers Passed - 06/10/2018  5:34 PM      Passed - Cr in normal range and within 180 days    Creat  Date Value Ref Range Status  12/09/2016 0.79 0.60 - 1.35 mg/dL Final   Creatinine, Ser  Date Value Ref Range Status  05/25/2018 0.90 0.76 - 1.27 mg/dL Final         Passed - K in normal range and within 180 days    Potassium  Date Value Ref Range Status  05/25/2018 4.5 3.5 - 5.2 mmol/L Final         Passed - Patient is not pregnant      Passed - Last BP in normal range    BP Readings from Last 1 Encounters:  05/25/18 124/85         Passed - Valid encounter within last 6 months    Recent Outpatient Visits          2 weeks ago Essential hypertension   [facility] Particia Nearing, [place]   1 month ago Essential hypertension   [facility] Roosvelt Maser [place], [place]   3 months ago Essential hypertension   [facility] Roosvelt Maser [place], [place]   3 months ago Phantom limb pain [facility])   [facility] Coy, Megan P, DO   5 months ago Phantom limb pain [facility])   [facility] Dorcas Carrow, DO      Future Appointments            In 5 months Maurice March, Salley Hews, PA-C [facility]

## 2018-06-19 ENCOUNTER — Other Ambulatory Visit: Payer: Self-pay | Admitting: Family Medicine

## 2018-06-25 ENCOUNTER — Encounter: Payer: Self-pay | Admitting: Family Medicine

## 2018-07-10 ENCOUNTER — Other Ambulatory Visit: Payer: Self-pay

## 2018-07-10 ENCOUNTER — Encounter: Payer: Self-pay | Admitting: Student in an Organized Health Care Education/Training Program

## 2018-07-10 ENCOUNTER — Ambulatory Visit
Payer: Medicare Other | Attending: Student in an Organized Health Care Education/Training Program | Admitting: Student in an Organized Health Care Education/Training Program

## 2018-07-10 VITALS — BP 133/92 | HR 80 | Temp 98.1°F | Resp 18 | Ht 68.0 in | Wt 185.0 lb

## 2018-07-10 DIAGNOSIS — Z791 Long term (current) use of non-steroidal anti-inflammatories (NSAID): Secondary | ICD-10-CM | POA: Insufficient documentation

## 2018-07-10 DIAGNOSIS — G546 Phantom limb syndrome with pain: Secondary | ICD-10-CM | POA: Diagnosis not present

## 2018-07-10 DIAGNOSIS — Z888 Allergy status to other drugs, medicaments and biological substances status: Secondary | ICD-10-CM | POA: Insufficient documentation

## 2018-07-10 DIAGNOSIS — D700 Congenital agranulocytosis: Secondary | ICD-10-CM | POA: Diagnosis not present

## 2018-07-10 DIAGNOSIS — G8929 Other chronic pain: Secondary | ICD-10-CM

## 2018-07-10 DIAGNOSIS — Z91018 Allergy to other foods: Secondary | ICD-10-CM | POA: Insufficient documentation

## 2018-07-10 DIAGNOSIS — Z882 Allergy status to sulfonamides status: Secondary | ICD-10-CM | POA: Insufficient documentation

## 2018-07-10 DIAGNOSIS — G894 Chronic pain syndrome: Secondary | ICD-10-CM

## 2018-07-10 DIAGNOSIS — K219 Gastro-esophageal reflux disease without esophagitis: Secondary | ICD-10-CM | POA: Diagnosis not present

## 2018-07-10 DIAGNOSIS — I10 Essential (primary) hypertension: Secondary | ICD-10-CM | POA: Insufficient documentation

## 2018-07-10 DIAGNOSIS — Z89611 Acquired absence of right leg above knee: Secondary | ICD-10-CM | POA: Insufficient documentation

## 2018-07-10 DIAGNOSIS — Z79899 Other long term (current) drug therapy: Secondary | ICD-10-CM | POA: Diagnosis not present

## 2018-07-10 DIAGNOSIS — Z9889 Other specified postprocedural states: Secondary | ICD-10-CM | POA: Diagnosis not present

## 2018-07-10 DIAGNOSIS — S88919A Complete traumatic amputation of unspecified lower leg, level unspecified, initial encounter: Secondary | ICD-10-CM | POA: Diagnosis not present

## 2018-07-10 DIAGNOSIS — M79604 Pain in right leg: Secondary | ICD-10-CM | POA: Diagnosis not present

## 2018-07-10 DIAGNOSIS — Z760 Encounter for issue of repeat prescription: Secondary | ICD-10-CM | POA: Insufficient documentation

## 2018-07-10 MED ORDER — HYDROCODONE-ACETAMINOPHEN 10-325 MG PO TABS: 1.0000 | ORAL_TABLET | Freq: Two times a day (BID) | ORAL | 0 refills | Status: DC | PRN

## 2018-07-10 NOTE — Progress Notes (Signed)
Patient's Name: Brandon Myers  MRN: [id number]  Referring Provider: Volney American,*  DOB: 25-May-1966  PCP: Volney American, PA-C  DOS: 07/10/2018  Note by: [name], MD  Service setting: Ambulatory outpatient  Specialty: Interventional Pain Management  Location: [facility] (AMB) Pain Management Facility    Patient type: Established   Primary Reason(s) for Visit: Encounter for prescription drug management. (Level of risk: moderate)  CC: Medication Refill (Hydrocodone/acetaminophen )  HPI  Brandon Myers is a 52 y.o. year old, male patient, who comes today for a medication management evaluation. 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 His primarily concern today is the Medication Refill (Hydrocodone/acetaminophen )  Pain Assessment: Location: Right, Upper Leg Radiating: Denies  Onset: More than a month ago Duration: Phantom pain Quality: Tingling, Pins and needles, Stabbing, Sharp Severity: 4 /10 (subjective, self-reported pain score)  Note: Reported level is compatible with observation.                         When using our objective Pain Scale, levels between 6 and 10/10 are said to belong in an emergency room, as it progressively worsens from a 6/10, described as severely limiting, requiring emergency care not usually available at an outpatient pain management facility. At a 6/10 level, communication becomes difficult and requires great effort. Assistance to reach the emergency department may be required. Facial flushing and profuse sweating along with potentially dangerous increases in heart rate and blood pressure will be  evident. Effect on ADL: "Not too much"  Timing: Constant Modifying factors: medication (hydrocodone)  BP: (!) 133/92  HR: 80  Brandon Myers was last scheduled for an appointment on 04/11/2018 for medication management. During today's appointment we reviewed Brandon Myers's chronic pain status, as well as his outpatient medication regimen.  The patient  reports that he does not use drugs. His body mass index is 28.13 kg/m.  Further details on both, my assessment(s), as well as the proposed treatment plan, please see below.  Controlled Substance Pharmacotherapy Assessment REMS (Risk Evaluation and Mitigation Strategy)  Analgesic:Hydrocodone 10 mg twice daily as needed, quantity 60/month MME/day:'20mg'$ /day. Janne Napoleon, RN  07/10/2018 11:46 AM  Sign at close encounter Nursing Pain Medication Assessment:  Safety precautions to be maintained throughout the outpatient stay will include: orient to surroundings, keep bed in low position, maintain call bell within reach at all times, provide assistance with transfer out of bed and ambulation.  Medication Inspection Compliance: Pill count conducted under aseptic conditions, in front of the patient. Neither the pills nor the bottle was removed from the patient's sight at any time. Once count was completed pills were immediately returned to the patient in their original bottle.  Medication: Hydrocodone/APAP Pill/Patch Count: 16 of 60 pills remain Pill/Patch Appearance: Markings consistent with prescribed medication Bottle Appearance: Standard pharmacy container. Clearly labeled. Filled Date: 32 / 16 / 2019 Last Medication intake:  Yesterday   Pharmacokinetics: Liberation and absorption (onset of action): WNL Distribution (time to peak effect): WNL Metabolism and excretion (duration of action): WNL         Pharmacodynamics: Desired effects: Analgesia: Brandon Myers reports >50% benefit. Functional ability: Patient reports that medication allows him  to accomplish  basic ADLs Clinically meaningful improvement in function (CMIF): Sustained CMIF goals met Perceived effectiveness: Described as relatively effective, allowing for increase in activities of daily living (ADL) Undesirable effects: Side-effects or Adverse reactions: None reported Monitoring: [place] PMP: Online review of the past 78-monthperiod conducted. Compliant with practice rules and regulations Last UDS on record: Summary  Date Value Ref Range Status  01/18/2018 FINAL  Final    Comment:    ==================================================================== TOXASSURE COMP DRUG ANALYSIS,UR ==================================================================== Test                             Result       Flag       Units Drug Present   Hydrocodone                    1782                    ng/mg creat   Dihydrocodeine                 90                      ng/mg creat   Norhydrocodone                 1107                    ng/mg creat    Sources of hydrocodone include scheduled prescription    medications. Dihydrocodeine and norhydrocodone are expected    metabolites of hydrocodone. Dihydrocodeine is also available as a    scheduled prescription medication.   N-Desmethyltramadol            2557                    ng/mg creat    N-desmethyltramadol is an expected metabolite of tramadol. Source    of tramadol is a prescription medication.   Gabapentin                     PRESENT   Nortriptyline                  PRESENT    Nortriptyline may be administered as a prescription drug; it is    also an expected metabolite of amitriptyline.   Acetaminophen                  PRESENT   Metoprolol                     PRESENT ==================================================================== Test                      Result    Flag   Units      Ref Range   Creatinine              68               mg/dL       >=20 ==================================================================== Declared Medications:  Medication list was not provided. ==================================================================== For clinical consultation, please call [phone] ====================================================================    UDS interpretation: Compliant          Medication Assessment Form: Reviewed. Patient indicates being compliant with therapy Treatment compliance: Compliant Risk Assessment Profile: Aberrant behavior: See prior evaluations. None observed or detected today Comorbid factors increasing risk of  overdose: See prior notes. No additional risks detected today Opioid risk tool (ORT) (Total Score): 0 Personal History of Substance Abuse (SUD-Substance use disorder):  Alcohol: Negative  Illegal Drugs: Negative  Rx Drugs: Negative  ORT Risk Level calculation: Low Risk Risk of substance use disorder (SUD): Low Opioid Risk Tool - 07/10/18 1145      Family History of Substance Abuse   Alcohol  Negative    Illegal Drugs  Negative    Rx Drugs  Negative      Personal History of Substance Abuse   Alcohol  Negative    Illegal Drugs  Negative    Rx Drugs  Negative      Age   Age between 76-45 years   No      Psychological Disease   Psychological Disease  Negative    Depression  Negative      Total Score   Opioid Risk Tool Scoring  0    Opioid Risk Interpretation  Low Risk      ORT Scoring interpretation table:  Score <3 = Low Risk for SUD  Score between 4-7 = Moderate Risk for SUD  Score >8 = High Risk for Opioid Abuse   Risk Mitigation Strategies:  Patient Counseling: Covered Patient-Prescriber Agreement (PPA): Present and active  Notification to other healthcare providers: Done  Pharmacologic Plan: No change in therapy, at this time.             Laboratory Chemistry  Inflammation Markers (CRP: Acute Phase) (ESR: Chronic Phase) No results found for: CRP,  ESRSEDRATE, LATICACIDVEN                       Rheumatology Markers No results found for: RF, ANA, LABURIC, URICUR, LYMEIGGIGMAB, LYMEABIGMQN, HLAB27                      Renal Function Markers Lab Results  Component Value Date   BUN 15 05/25/2018   CREATININE 0.90 05/25/2018   BCR 17 05/25/2018   GFRAA 115 05/25/2018   GFRNONAA 99 05/25/2018                             Hepatic Function Markers Lab Results  Component Value Date   AST 34 03/09/2018   ALT 36 03/09/2018   ALBUMIN 4.7 03/09/2018   ALKPHOS 123 (H) 03/09/2018                        Electrolytes Lab Results  Component Value Date   NA 141 05/25/2018   K 4.5 05/25/2018   CL 102 05/25/2018   CALCIUM 10.0 05/25/2018                        Neuropathy Markers Lab Results  Component Value Date   HGBA1C 5.1 12/09/2016                        CNS Tests No results found for: COLORCSF, APPEARCSF, RBCCOUNTCSF, WBCCSF, POLYSCSF, LYMPHSCSF, EOSCSF, PROTEINCSF, GLUCCSF, JCVIRUS, CSFOLI, IGGCSF                      Bone Pathology Markers No results found for: [place], [id number], CB6384TX6, IW8032ZY2, 25OHVITD1, 25OHVITD2, 25OHVITD3, TESTOFREE, TESTOSTERONE  Coagulation Parameters Lab Results  Component Value Date   PLT 196 03/09/2018   LABHEMA Note: 03/09/2018                        Cardiovascular Markers Lab Results  Component Value Date   HGB 14.0 03/09/2018   HCT 42.8 03/09/2018                         CA Markers No results found for: CEA, CA125, LABCA2                      Note: Lab results reviewed.   Meds   Current Outpatient Medications:  .  Filgrastim-sndz (ZARXIO) 480 MCG/0.8ML SOSY, Inject 0.8 mLs into the muscle every other day., Disp: , Rfl:  .  fluticasone (FLONASE) 50 MCG/ACT nasal spray, INSTILL 1 TO 2 SPRAYS IN EACH NOSTRIL EVERY NIGHT AT BEDTIME x 2-3 weeks, Disp: 16 g, Rfl: 12 .  gabapentin (NEURONTIN) 300 MG capsule, Take 1 capsule once day in mid day, Disp: 90  capsule, Rfl: 1 .  gabapentin (NEURONTIN) 800 MG tablet, Take 1 tablet (800 mg total) by mouth 3 (three) times daily., Disp: 270 tablet, Rfl: 3 .  hydrochlorothiazide (HYDRODIURIL) 12.5 MG tablet, TAKE 1 TABLET(12.5 MG) BY MOUTH DAILY, Disp: 90 tablet, Rfl: 1 .  HYDROcodone-acetaminophen (NORCO) 10-325 MG tablet, Take 1 tablet by mouth 2 (two) times daily as needed for severe pain. For chronic pain To last for 30 days from fill date To fill on or after: 07/15/2018, 08/14/2018, 09/13/2018, Disp: 60 tablet, Rfl: 0 .  loratadine (CLARITIN) 10 MG tablet, Take 1 tablet (10 mg total) by mouth daily., Disp: 90 tablet, Rfl: 3 .  losartan (COZAAR) 100 MG tablet, TAKE 1 TABLET(100 MG) BY MOUTH DAILY, Disp: 90 tablet, Rfl: 1 .  Melatonin 10 MG TABS, Take 10 mg by mouth at bedtime., Disp: , Rfl:  .  meloxicam (MOBIC) 7.5 MG tablet, TAKE 1 TABLET(7.5 MG) BY MOUTH DAILY, Disp: 30 tablet, Rfl: 0 .  metoprolol succinate (TOPROL-XL) 50 MG 24 hr tablet, Take 1 tablet (50 mg total) by mouth daily. Take with or immediately following a meal., Disp: 90 tablet, Rfl: 1 .  nortriptyline (PAMELOR) 10 MG capsule, Take 1 capsule (10 mg total) by mouth at bedtime., Disp: 90 capsule, Rfl: 1 .  omeprazole (PRILOSEC) 40 MG capsule, Take 1 capsule (40 mg total) by mouth daily., Disp: 90 capsule, Rfl: 3 .  VENTOLIN HFA 108 (90 Base) MCG/ACT inhaler, INHALE 2 PUFFS BY MOUTH FOUR TIMES DAILY AS NEEDED, Disp: 18 g, Rfl: 12  ROS  Constitutional: Denies any fever or chills Gastrointestinal: No reported hemesis, hematochezia, vomiting, or acute GI distress Musculoskeletal: Denies any acute onset joint swelling, redness, loss of ROM, or weakness Neurological: No reported episodes of acute onset apraxia, aphasia, dysarthria, agnosia, amnesia, paralysis, loss of coordination, or loss of consciousness  Allergies  Brandon Myers is allergic to enalapril; prochlorperazine; sulfa antibiotics; benadryl [diphenhydramine]; and strawberry  extract.  [place]  Drug: Brandon Myers  reports that he does not use drugs. Alcohol:  reports that he does not drink alcohol. Tobacco:  reports that he has never smoked. He has never used smokeless tobacco. Medical:  has a past medical history of Allergy, Controlled substance agreement signed (04/02/2017), GERD (gastroesophageal reflux disease), Hypertension, and Neutropenia, congenital ([place]). Surgical: Brandon Myers  has a past surgical history that includes Amputation; Appendectomy;  Colon surgery (1994); Above elbow arm amputation (Right, 1993); and Leg amputation through knee (Right, 1993). Family: family history includes Alzheimer's disease in his mother; Asthma in his mother; Cancer in his father, paternal grandfather, and paternal grandmother; Depression in his mother; Heart Problems in his mother; Stroke in his maternal grandmother; Thyroid disease in his mother.  Constitutional Exam  General appearance: Well nourished, well developed, and well hydrated. In no apparent acute distress Vitals:   07/10/18 1135  BP: (!) 133/92  Pulse: 80  Resp: 18  Temp: 98.1 F (36.7 C)  SpO2: 98%  Weight: 185 lb (83.9 kg)  Height: '5\' 8"'$  (1.727 m)   BMI Assessment: Estimated body mass index is 28.13 kg/m as calculated from the following:   Height as of this encounter: '5\' 8"'$  (1.727 m).   Weight as of this encounter: 185 lb (83.9 kg).  BMI interpretation table: BMI level Category Range association with higher incidence of chronic pain  <18 kg/m2 Underweight   18.5-24.9 kg/m2 Ideal body weight   25-29.9 kg/m2 Overweight Increased incidence by 20%  30-34.9 kg/m2 Obese (Class I) Increased incidence by 68%  35-39.9 kg/m2 Severe obesity (Class II) Increased incidence by 136%  >40 kg/m2 Extreme obesity (Class III) Increased incidence by 254%   Patient's current BMI Ideal Body weight  Body mass index is 28.13 kg/m. Ideal body weight: 68.4 kg (150 lb 12.7 oz) Adjusted ideal body weight: 74.6 kg (164 lb 7.6 oz)    BMI Readings from Last 4 Encounters:  07/10/18 28.13 kg/m  05/25/18 27.98 kg/m  04/25/18 28.05 kg/m  04/11/18 25.54 kg/m   Wt Readings from Last 4 Encounters:  07/10/18 185 lb (83.9 kg)  05/25/18 184 lb (83.5 kg)  04/25/18 184 lb 8 oz (83.7 kg)  04/11/18 168 lb (76.2 kg)  Psych/Mental status: Alert, oriented x 3 (person, place, & time)       Eyes: PERLA Respiratory: No evidence of acute respiratory distress  Cervical Spine Area Exam  Skin & Axial Inspection: No masses, redness, edema, swelling, or associated skin lesions Alignment: Symmetrical Functional ROM: Unrestricted ROM      Stability: No instability detected Muscle Tone/Strength: Functionally intact. No obvious neuro-muscular anomalies detected. Sensory (Neurological): Unimpaired Palpation: No palpable anomalies               Upper Extremity (UE) Exam    Side:Right upper extremity  Side:Left upper extremity   Skin & Extremity Inspection:Above elbow amputation (AEA)  Skin & Extremity Inspection:Skin color, temperature, and hair growth are WNL. No peripheral edema or cyanosis. No masses, redness, swelling, asymmetry, or associated skin lesions. No contractures.   Functional IHK:VQQVZDGLO ROM  Functional VFI:EPPIRJJOACZY ROM   Muscle Tone/Strength:Deconditioned  Muscle Tone/Strength:Functionally intact. No obvious neuro-muscular anomalies detected.   Sensory (Neurological):Neuropathic pain pattern  Sensory (Neurological):Unimpaired   Palpation:No palpable anomalies  Palpation:No palpable anomalies   Provocative Test(s): Phalen's test:deferred Tinel's test:deferred Apley's scratch test (touch opposite shoulder): Action 1 (Across chest):deferred Action 2 (Overhead):deferred Action 3 (LB reach):deferred   Provocative Test(s): Phalen's test:deferred Tinel's test:deferred Apley's scratch test (touch opposite  shoulder): Action 1 (Across chest):deferred Action 2 (Overhead):deferred Action 3 (LB reach):deferred     Thoracic Spine Area Exam  Skin & Axial Inspection:No masses, redness, or swelling Alignment:Symmetrical Functional SAY:[id number] ROM Stability:No instability detected Muscle Tone/Strength:Functionally intact. No obvious neuro-muscular anomalies detected. Sensory (Neurological):Unimpaired Muscle strength & Tone:No palpable anomalies  Lumbar Spine Area Exam  Skin & Axial Inspection:No masses, redness, or swelling Alignment:Symmetrical Functional TDD:[id number] ROM Stability:No  instability detected Muscle Tone/Strength:Functionally intact. No obvious neuro-muscular anomalies detected. Sensory (Neurological):Unimpaired Palpation:No palpable anomalies Provocative Tests: Lumbar Hyperextension/rotation test:deferred today Lumbar quadrant test (Kemp's test):deferred today Lumbar Lateral bending test:deferred today Patrick's Maneuver:deferred today FABER test:deferred today Thigh-thrust test:deferred today S-I compression test:deferred today S-I distraction test:deferred today  Gait & Posture Assessment  Ambulation:Limited, prosthesis in place Gait:Antalgic gait (limping) Posture:Difficulty with positional changes  Lower Extremity Exam    Side:Right lower extremity  Side:Left lower extremity  Stability:No instability observed  Stability:No instability observed  Skin & Extremity Inspection:Above knee amputation (AKA)  Skin & Extremity Inspection:Skin color, temperature, and hair growth are WNL. No peripheral edema or cyanosis. No masses, redness, swelling, asymmetry, or associated skin lesions. No contractures.  Functional UYQ:IHKVQQVZD ROM   Functional GLO:VFIEPPIRJJOA ROM   Muscle Tone/Strength:Functionally intact.  No obvious neuro-muscular anomalies detected.  Muscle Tone/Strength:Functionally intact. No obvious neuro-muscular anomalies detected.  Sensory (Neurological):Neuropathic pain pattern  Sensory (Neurological):Unimpaired  Palpation:No palpable anomalies  Palpation:No palpable anomalies    Assessment  Primary Diagnosis & Pertinent Problem List: The primary encounter diagnosis was Chronic pain syndrome. 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  Status Diagnosis  Controlled Controlled Controlled 1. Chronic pain syndrome   2. 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 Phantom limb pain ([place])   4. Congenital neutropenia ([facility])   5. Chronic pain of right lower extremity      General Recommendations: The pain condition that the patient suffers from is best treated with a multidisciplinary approach that involves an increase in physical activity to prevent de-conditioning and worsening of the pain cycle, as well as psychological counseling (formal and/or informal) to address the co-morbid psychological affects of pain. Treatment will often involve judicious use of pain medications and interventional procedures to decrease the pain, allowing the patient to participate in the physical activity that will ultimately produce long-lasting pain reductions. The goal of the multidisciplinary approach is to return the patient to a higher level of overall function and to restore their ability to perform activities of daily living.  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 Patient also has a history of congenital neutropenia. Patient does experience phantom pain in both of his amputated extremities. His current medications include 800 mg of gabapentin 3 times a day plus an extra dose of 200 mg  equaling a total dose of 2700 mg.Patient also continues his nortriptyline10 mg nightly. Patient was previously on tramadol 50 mg twice daily but states that the medication started to become ineffective especially since his phantom limb pain has worsened  Today we will refill the patient's hydrocodone as below. PMP checked and ok, UDS up to date.  -Continue gabapentin and nortriptyline as prescribed  Plan of Care  Pharmacotherapy (Medications Ordered): Meds ordered this encounter  Medications  . DISCONTD: HYDROcodone-acetaminophen (NORCO) 10-325 MG tablet    Sig: Take 1 tablet by mouth 2 (two) times daily as needed for severe pain. For chronic pain To last for 30 days from fill date To fill on or after: 07/15/2018, 08/14/2018, 09/13/2018    Dispense:  60 tablet    Refill:  0  . DISCONTD: HYDROcodone-acetaminophen (NORCO) 10-325 MG tablet    Sig: Take 1 tablet by mouth 2 (two) times daily as needed for severe pain. For chronic pain To last  for 30 days from fill date To fill on or after: 07/15/2018, 08/14/2018, 09/13/2018    Dispense:  60 tablet    Refill:  0  . HYDROcodone-acetaminophen (NORCO) 10-325 MG tablet    Sig: Take 1 tablet by mouth 2 (two) times daily as needed for severe pain. For chronic pain To last for 30 days from fill date To fill on or after: 07/15/2018, 08/14/2018, 09/13/2018    Dispense:  60 tablet    Refill:  0   Time Note:Greater than 50% of the 10mnute(s) of face-to-face time spent with BrandonMyers, was spent in counseling/coordination of care regarding:the appropriate use of the pain scale, opioid tolerance,BrandonMyers's primary cause of pain, the treatment plan, medication side effects, the opioid analgesic risks and possible complications, the appropriate use ofhismedications, realistic expectations, the goals of pain management (increased in functionality), the medication agreement and the patient's responsibilities when it comes to controlled  substances.  Provider-requested follow-up: Return in about 3 months (around 10/09/2018) for Medication Management.  Future Appointments  Date Time Provider DCove 10/09/2018  1:30 PM LGillis Santa MD [facility] None  11/23/2018  1:30 PM LVolney American [facility]-C [facility]   Primary Care Physician: LVolney American PA-C [facility] Note by: BGillis Santa M.D Date: 07/10/2018; Time: 3:29 PM  Patient Instructions  You were given 3 prescriptions for Hydrocodone today.

## 2018-07-10 NOTE — Progress Notes (Signed)
Nursing Pain Medication Assessment:  Safety precautions to be maintained throughout the outpatient stay will include: orient to surroundings, keep bed in low position, maintain call bell within reach at all times, provide assistance with transfer out of bed and ambulation.  Medication Inspection Compliance: Pill count conducted under aseptic conditions, in front of the patient. Neither the pills nor the bottle was removed from the patient's sight at any time. Once count was completed pills were immediately returned to the patient in their original bottle.  Medication: Hydrocodone/APAP Pill/Patch Count: 16 of 60 pills remain Pill/Patch Appearance: Markings consistent with prescribed medication Bottle Appearance: Standard pharmacy container. Clearly labeled. Filled Date: 6411 / 16 / 2019 Last Medication intake:  Yesterday

## 2018-07-10 NOTE — Patient Instructions (Signed)
You were given 3 prescriptions for Hydrocodone today. 

## 2018-08-23 ENCOUNTER — Other Ambulatory Visit: Payer: Self-pay | Admitting: Family Medicine

## 2018-09-09 ENCOUNTER — Other Ambulatory Visit: Payer: Self-pay | Admitting: Family Medicine

## 2018-09-10 NOTE — Telephone Encounter (Signed)
Patient has appointment scheduled in March. Requested Prescriptions  Pending Prescriptions Disp Refills  . nortriptyline (PAMELOR) 10 MG capsule [Pharmacy Med Name: NORTRIPTYLINE 10MG  CAPSULES] 90 capsule 1    Sig: TAKE 1 CAPSULE(10 MG) BY MOUTH AT BEDTIME     Psychiatry:  Antidepressants - Heterocyclics (TCAs) Passed - 09/09/2018 12:35 AM      Passed - Valid encounter within last 6 months    Recent Outpatient Visits          3 months ago Essential hypertension   [facility] Maser [place], [place]   4 months ago Essential hypertension   [facility] Maser [place], [place]   6 months ago Essential hypertension   [facility] Ro[place] Maser [place], [place]   6 months ago Phantom limb pain [facility])   [facility] [place], Megan P, DO   8 months ago Phantom limb pain [facility])   [facility] Dorcas Carrow, DO      Future Appointments            In 2 months Maurice March, Salley Hews, PA-C [facility]

## 2018-09-19 ENCOUNTER — Ambulatory Visit (INDEPENDENT_AMBULATORY_CARE_PROVIDER_SITE_OTHER): Payer: Medicare Other | Admitting: Family Medicine

## 2018-09-19 ENCOUNTER — Encounter: Payer: Self-pay | Admitting: Family Medicine

## 2018-09-19 ENCOUNTER — Ambulatory Visit: Payer: Medicare Other | Admitting: Family Medicine

## 2018-09-19 VITALS — BP 160/99 | HR 80 | Temp 98.0°F | Ht 68.0 in | Wt 194.0 lb

## 2018-09-19 DIAGNOSIS — J0100 Acute maxillary sinusitis, unspecified: Secondary | ICD-10-CM

## 2018-09-19 MED ORDER — AMOXICILLIN-POT CLAVULANATE 875-125 MG PO TABS: 1.0000 | ORAL_TABLET | Freq: Two times a day (BID) | ORAL | 0 refills | Status: DC

## 2018-09-19 NOTE — Progress Notes (Signed)
BP (!) 160/99   Pulse 80   Temp 98 F (36.7 C) (Oral)   Ht 5\' 8"  (1.727 m)   Wt 194 lb (88 kg)   SpO2 99%   BMI 29.50 kg/m    Subjective:    Patient ID: Brandon Myers, male    DOB: 1965-12-25, 53 y.o.   MRN: [id number]  HPI: Brandon Myers is a 53 y.o. male  Chief Complaint  Patient presents with  . Nasal Congestion    Has been taking dayquil and asprin   2 weeks of sinus pain and pressure, congestion, headaches, fatigue, achiness off and on. Taking dayquil, afrin with no relief. Several sick contacts. Hx of allergies on claritin daily. Denies CP, SOB, wheezing, N/V/D.   Relevant past medical, surgical, family and social history reviewed and updated as indicated. Interim medical history since our last visit reviewed. Allergies and medications reviewed and updated.  Review of Systems  Per HPI unless specifically indicated above     Objective:    BP (!) 160/99   Pulse 80   Temp 98 F (36.7 C) (Oral)   Ht 5\' 8"  (1.727 m)   Wt 194 lb (88 kg)   SpO2 99%   BMI 29.50 kg/m   Wt Readings from Last 3 Encounters:  09/19/18 194 lb (88 kg)  07/10/18 185 lb (83.9 kg)  05/25/18 184 lb (83.5 kg)    Physical Exam Vitals signs and nursing note reviewed.  Constitutional:      Appearance: He is well-developed.  HENT:     Head: Atraumatic.     Right Ear: External ear normal.     Left Ear: External ear normal.     Nose: Congestion present.     Mouth/Throat:     Pharynx: Posterior oropharyngeal erythema present. No oropharyngeal exudate.  Eyes:     Conjunctiva/sclera: Conjunctivae normal.     Pupils: Pupils are equal, round, and reactive to light.  Neck:     Musculoskeletal: Normal range of motion and neck supple.  Cardiovascular:     Rate and Rhythm: Normal rate and regular rhythm.  Pulmonary:     Effort: Pulmonary effort is normal. No respiratory distress.     Breath sounds: No wheezing or rales.  Musculoskeletal: Normal range of motion.  Lymphadenopathy:   Cervical: No cervical adenopathy.  Skin:    General: Skin is warm and dry.  Neurological:     Mental Status: He is alert and oriented to person, place, and time.  Psychiatric:        Behavior: Behavior normal.     Results for orders placed or performed in visit on 05/25/18  Basic Metabolic Panel (BMET)  Result Value Ref Range   Glucose 94 65 - 99 mg/dL   BUN 15 6 - 24 mg/dL   Creatinine, Ser 3.66 0.76 - 1.27 mg/dL   GFR calc non Af Amer 99 >59 mL/min/1.73   GFR calc Af Amer 115 >59 mL/min/1.73   BUN/Creatinine Ratio 17 9 - 20   Sodium 141 134 - 144 mmol/L   Potassium 4.5 3.5 - 5.2 mmol/L   Chloride 102 96 - 106 mmol/L   CO2 26 20 - 29 mmol/L   Calcium 10.0 8.7 - 10.2 mg/dL      Assessment & Plan:   Problem List Items Addressed This Visit    None    Visit Diagnoses    Acute maxillary sinusitis, recurrence not specified    -  Primary  Tx with augmentin, mucinex, sinus rinses. Supportive care and return precautions reviewed. F/u if not improving   Relevant Medications   amoxicillin-clavulanate (AUGMENTIN) 875-125 MG tablet       Follow up plan: Return if symptoms worsen or fail to improve.

## 2018-10-09 ENCOUNTER — Ambulatory Visit
Payer: Medicare Other | Attending: Student in an Organized Health Care Education/Training Program | Admitting: Student in an Organized Health Care Education/Training Program

## 2018-10-09 ENCOUNTER — Other Ambulatory Visit: Payer: Self-pay

## 2018-10-09 ENCOUNTER — Encounter: Payer: Self-pay | Admitting: Student in an Organized Health Care Education/Training Program

## 2018-10-09 VITALS — BP 155/100 | HR 76 | Temp 98.3°F | Resp 18 | Ht 68.0 in | Wt 185.0 lb

## 2018-10-09 DIAGNOSIS — G546 Phantom limb syndrome with pain: Secondary | ICD-10-CM | POA: Insufficient documentation

## 2018-10-09 DIAGNOSIS — G894 Chronic pain syndrome: Secondary | ICD-10-CM | POA: Insufficient documentation

## 2018-10-09 DIAGNOSIS — S88919A Complete traumatic amputation of unspecified lower leg, level unspecified, initial encounter: Secondary | ICD-10-CM | POA: Diagnosis present

## 2018-10-09 DIAGNOSIS — M79604 Pain in right leg: Secondary | ICD-10-CM | POA: Diagnosis present

## 2018-10-09 DIAGNOSIS — D700 Congenital agranulocytosis: Secondary | ICD-10-CM | POA: Insufficient documentation

## 2018-10-09 DIAGNOSIS — G8929 Other chronic pain: Secondary | ICD-10-CM

## 2018-10-09 MED ORDER — HYDROCODONE-ACETAMINOPHEN 10-325 MG PO TABS: 1.0000 | ORAL_TABLET | Freq: Two times a day (BID) | ORAL | 0 refills | Status: AC | PRN

## 2018-10-09 MED ORDER — HYDROCODONE-ACETAMINOPHEN 10-325 MG PO TABS: 1.0000 | ORAL_TABLET | Freq: Two times a day (BID) | ORAL | 0 refills | Status: DC | PRN

## 2018-10-09 NOTE — Patient Instructions (Addendum)
You have been escribed 3 scripts for Hydrocodone to last until 01-12-2019

## 2018-10-09 NOTE — Progress Notes (Signed)
Patient's Name: Brandon Myers  MRN: [id number]  Referring Provider: Volney American,*  DOB: 1966/02/13  PCP: Volney American, PA-C  DOS: 10/09/2018  Note by: [name], MD  Service setting: Ambulatory outpatient  Specialty: Interventional Pain Management  Location: [facility] (AMB) Pain Management Facility    Patient type: Established   Primary Reason(s) for Visit: Encounter for prescription drug management. (Level of risk: moderate)  CC: Leg Pain  HPI  Brandon Myers is a 53 y.o. year old, male patient, who comes today for a medication management evaluation. 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 His primarily concern today is the Leg Pain  Pain Assessment: Location: Right Leg Radiating:  yes phantom pain Onset: More than a month ago Duration: Chronic pain Quality: Tingling, Constant, Throbbing Severity: 5 /10 (subjective, self-reported pain score)  Note: Reported level is compatible with observation.                         When using our objective Pain Scale, levels between 6 and 10/10 are said to belong in an emergency room, as it progressively worsens from a 6/10, described as severely limiting, requiring emergency care not usually available at an outpatient pain management facility. At a 6/10 level, communication becomes difficult and requires great effort. Assistance to reach the emergency department may be required. Facial flushing and profuse sweating along with potentially dangerous increases in heart rate and blood pressure will be evident. Effect on ADL: Limits activities Timing: Constant Modifying factors: medicine BP:  (!) 155/100  HR: 76  Brandon Myers was last scheduled for an appointment on 07/10/2018 for medication management. During today's appointment we reviewed Brandon Myers's chronic pain status, as well as his outpatient medication regimen.  No changes in medical history.  Chronic pain at baseline.  Discussed bowel health.  The patient  reports no history of drug use. His body mass index is 28.13 kg/m.  Further details on both, my assessment(s), as well as the proposed treatment plan, please see below.  Controlled Substance Pharmacotherapy Assessment REMS (Risk Evaluation and Mitigation Strategy)  Analgesic:Hydrocodone 10 mg twice daily as needed, quantity 60/month MME/day:'20mg'$ /day.  Dewayne Shorter, RN  10/09/2018  1:39 PM  Signed Nursing Pain Medication Assessment:  Safety precautions to be maintained throughout the outpatient stay will include: orient to surroundings, keep bed in low position, maintain call bell within reach at all times, provide assistance with transfer out of bed and ambulation.  Medication Inspection Compliance: Pill count conducted under aseptic conditions, in front of the patient. Neither the pills nor the bottle was removed from the patient's sight at any time. Once count was completed pills were immediately returned to the patient in their original bottle.  Medication: Hydrocodone/APAP Pill/Patch Count: 15 of 60 pills remain Pill/Patch Appearance: Markings consistent with prescribed medication Bottle Appearance: Standard pharmacy container. Clearly labeled. Filled Date: 02 / 15 / 2020 Last Medication intake:  Yesterday   Pharmacokinetics: Liberation and absorption (onset of action): WNL Distribution (time to peak effect): WNL Metabolism and excretion (duration of action): WNL         Pharmacodynamics: Desired effects: Analgesia: Brandon Myers reports >50% benefit. Functional ability: Patient reports that medication allows him  to accomplish basic ADLs Clinically meaningful  improvement in function (CMIF): Sustained CMIF goals met Perceived effectiveness: Described as relatively effective, allowing for increase in activities of daily living (ADL) Undesirable effects: Side-effects or Adverse reactions: None reported Monitoring: [place] PMP: Online review of the past 71-monthperiod conducted. Compliant with practice rules and regulations Last UDS on record: Summary  Date Value Ref Range Status  01/18/2018 FINAL  Final    Comment:    ==================================================================== TOXASSURE COMP DRUG ANALYSIS,UR ==================================================================== Test                             Result       Flag       Units Drug Present   Hydrocodone                    1782                    ng/mg creat   Dihydrocodeine                 90                      ng/mg creat   Norhydrocodone                 1107                    ng/mg creat    Sources of hydrocodone include scheduled prescription    medications. Dihydrocodeine and norhydrocodone are expected    metabolites of hydrocodone. Dihydrocodeine is also available as a    scheduled prescription medication.   N-Desmethyltramadol            2557                    ng/mg creat    N-desmethyltramadol is an expected metabolite of tramadol. Source    of tramadol is a prescription medication.   Gabapentin                     PRESENT   Nortriptyline                  PRESENT    Nortriptyline may be administered as a prescription drug; it is    also an expected metabolite of amitriptyline.   Acetaminophen                  PRESENT   Metoprolol                     PRESENT ==================================================================== Test                      Result    Flag   Units      Ref Range   Creatinine              68               mg/dL      >=20 ==================================================================== Declared Medications:  Medication list was not  provided. ==================================================================== For clinical consultation, please call ([phone] ====================================================================    UDS interpretation: Compliant          Medication Assessment Form: Reviewed. Patient indicates being compliant with therapy Treatment compliance: Compliant Risk Assessment Profile: Aberrant behavior: See initial evaluations. None observed or detected today Comorbid factors  increasing risk of overdose: See initial evaluation. No additional risks detected today Opioid risk tool (ORT):  Opioid Risk  07/10/2018  Alcohol 0  Illegal Drugs 0  Rx Drugs 0  Alcohol 0  Illegal Drugs 0  Rx Drugs 0  Age between 16-45 years  0  History of Preadolescent Sexual Abuse -  Psychological Disease 0  Depression 0  Opioid Risk Tool Scoring 0  Opioid Risk Interpretation Low Risk    ORT Scoring interpretation table:  Score <3 = Low Risk for SUD  Score between 4-7 = Moderate Risk for SUD  Score >8 = High Risk for Opioid Abuse   Risk of substance use disorder (SUD): Low  Risk Mitigation Strategies:  Patient Counseling: Covered Patient-Prescriber Agreement (PPA): Present and active  Notification to other healthcare providers: Done  Pharmacologic Plan: No change in therapy, at this time.             Laboratory Chemistry  Inflammation Markers (CRP: Acute Phase) (ESR: Chronic Phase) No results found for: CRP, ESRSEDRATE, LATICACIDVEN                       Rheumatology Markers No results found for: RF, ANA, LABURIC, URICUR, LYMEIGGIGMAB, LYMEABIGMQN, HLAB27                      Renal Function Markers Lab Results  Component Value Date   BUN 15 05/25/2018   CREATININE 0.90 05/25/2018   BCR 17 05/25/2018   GFRAA 115 05/25/2018   GFRNONAA 99 05/25/2018                             Hepatic Function Markers Lab Results  Component Value Date   AST 34 03/09/2018   ALT 36 03/09/2018    ALBUMIN 4.7 03/09/2018   ALKPHOS 123 (H) 03/09/2018                        Electrolytes Lab Results  Component Value Date   NA 141 05/25/2018   K 4.5 05/25/2018   CL 102 05/25/2018   CALCIUM 10.0 05/25/2018                        Neuropathy Markers Lab Results  Component Value Date   HGBA1C 5.1 12/09/2016                        CNS Tests No results found for: COLORCSF, APPEARCSF, RBCCOUNTCSF, WBCCSF, POLYSCSF, LYMPHSCSF, EOSCSF, PROTEINCSF, GLUCCSF, JCVIRUS, CSFOLI, IGGCSF                      Bone Pathology Markers No results found for: VD25OH, VD125OH2TOT, [id number], [id number], 25OHVITD1, 25OHVITD2, 25OHVITD3, TESTOFREE, TESTOSTERONE                       Coagulation Parameters Lab Results  Component Value Date   PLT 196 03/09/2018   LABHEMA Note: 03/09/2018                        Cardiovascular Markers Lab Results  Component Value Date   HGB 14.0 03/09/2018   HCT 42.8 03/09/2018                         CA Markers No  results found for: CEA, CA125, LABCA2                      Endocrine Markers Lab Results  Component Value Date   TSH 3.580 01/09/2018                        Note: Lab results reviewed.   Meds   Current Outpatient Medications:  .  Filgrastim-sndz (ZARXIO) 480 MCG/0.8ML SOSY, Inject 0.8 mLs into the muscle every other day., Disp: , Rfl:  .  fluticasone (FLONASE) 50 MCG/ACT nasal spray, INSTILL 1 TO 2 SPRAYS IN EACH NOSTRIL EVERY NIGHT AT BEDTIME x 2-3 weeks, Disp: 16 g, Rfl: 12 .  gabapentin (NEURONTIN) 300 MG capsule, Take 1 capsule once day in mid day, Disp: 90 capsule, Rfl: 1 .  gabapentin (NEURONTIN) 800 MG tablet, Take 1 tablet (800 mg total) by mouth 3 (three) times daily., Disp: 270 tablet, Rfl: 3 .  hydrochlorothiazide (HYDRODIURIL) 12.5 MG tablet, TAKE 1 TABLET(12.5 MG) BY MOUTH DAILY, Disp: 90 tablet, Rfl: 1 .  [START ON 10/14/2018] HYDROcodone-acetaminophen (NORCO) 10-325 MG tablet, Take 1 tablet by mouth 2 (two) times daily as needed  for up to 30 days for severe pain. For chronic pain To last for 30 days from fill date, Disp: 60 tablet, Rfl: 0 .  loratadine (CLARITIN) 10 MG tablet, Take 1 tablet (10 mg total) by mouth daily., Disp: 90 tablet, Rfl: 3 .  losartan (COZAAR) 100 MG tablet, TAKE 1 TABLET(100 MG) BY MOUTH DAILY, Disp: 90 tablet, Rfl: 1 .  Melatonin 10 MG TABS, Take 10 mg by mouth at bedtime., Disp: , Rfl:  .  meloxicam (MOBIC) 7.5 MG tablet, TAKE 1 TABLET(7.5 MG) BY MOUTH DAILY, Disp: 30 tablet, Rfl: 0 .  metoprolol succinate (TOPROL-XL) 50 MG 24 hr tablet, TAKE 1 TABLET BY MOUTH DAILY WITH OR IMMEDIATELY FOLLOWING A MEAL, Disp: 90 tablet, Rfl: 0 .  nortriptyline (PAMELOR) 10 MG capsule, TAKE 1 CAPSULE(10 MG) BY MOUTH AT BEDTIME, Disp: 90 capsule, Rfl: 0 .  omeprazole (PRILOSEC) 40 MG capsule, Take 1 capsule (40 mg total) by mouth daily., Disp: 90 capsule, Rfl: 3 .  VENTOLIN HFA 108 (90 Base) MCG/ACT inhaler, INHALE 2 PUFFS BY MOUTH FOUR TIMES DAILY AS NEEDED, Disp: 18 g, Rfl: 12 .  amoxicillin-clavulanate (AUGMENTIN) 875-125 MG tablet, Take 1 tablet by mouth 2 (two) times daily. (Patient not taking: Reported on 10/09/2018), Disp: 20 tablet, Rfl: 0 .  [START ON 11/13/2018] HYDROcodone-acetaminophen (NORCO) 10-325 MG tablet, Take 1 tablet by mouth every 12 (twelve) hours as needed for up to 30 days for severe pain. Must last 30 days., Disp: 60 tablet, Rfl: 0 .  [START ON 12/13/2018] HYDROcodone-acetaminophen (NORCO) 10-325 MG tablet, Take 1 tablet by mouth every 12 (twelve) hours as needed for up to 30 days for severe pain. Must last 30 days., Disp: 60 tablet, Rfl: 0  ROS  Constitutional: Denies any fever or chills Gastrointestinal: No reported hemesis, hematochezia, vomiting, or acute GI distress Musculoskeletal: Denies any acute onset joint swelling, redness, loss of ROM, or weakness Neurological: No reported episodes of acute onset apraxia, aphasia, dysarthria, agnosia, amnesia, paralysis, loss of coordination, or loss  of consciousness  Allergies  Mr. Bazin is allergic to enalapril; prochlorperazine; sulfa antibiotics; benadryl [diphenhydramine]; and strawberry extract.  PFSH  Drug: Mr. Dilworth  reports no history of drug use. Alcohol:  reports no history of alcohol use. Tobacco:  reports that he  has never smoked. He has never used smokeless tobacco. Medical:  has a past medical history of Allergy, Controlled substance agreement signed (04/02/2017), GERD (gastroesophageal reflux disease), Hypertension, and Neutropenia, congenital ([place]). Surgical: Mr. Head  has a past surgical history that includes Amputation; Appendectomy; Colon surgery (1994); Above elbow arm amputation (Right, 1993); and Leg amputation through knee (Right, 1993). Family: family history includes Alzheimer's disease in his mother; Asthma in his mother; Cancer in his father, paternal grandfather, and paternal grandmother; Depression in his mother; Heart Problems in his mother; Stroke in his maternal grandmother; Thyroid disease in his mother.  Constitutional Exam  General appearance: Well nourished, well developed, and well hydrated. In no apparent acute distress Vitals:   10/09/18 1332  BP: (!) 155/100  Pulse: 76  Resp: 18  Temp: 98.3 F (36.8 C)  SpO2: 100%  Weight: 185 lb (83.9 kg)  Height: '5\' 8"'$  (1.727 m)   BMI Assessment: Estimated body mass index is 28.13 kg/m as calculated from the following:   Height as of this encounter: '5\' 8"'$  (1.727 m).   Weight as of this encounter: 185 lb (83.9 kg).  BMI interpretation table: BMI level Category Range association with higher incidence of chronic pain  <18 kg/m2 Underweight   18.5-24.9 kg/m2 Ideal body weight   25-29.9 kg/m2 Overweight Increased incidence by 20%  30-34.9 kg/m2 Obese (Class I) Increased incidence by 68%  35-39.9 kg/m2 Severe obesity (Class II) Increased incidence by 136%  >40 kg/m2 Extreme obesity (Class III) Increased incidence by 254%   Patient's current BMI Ideal  Body weight  Body mass index is 28.13 kg/m. Ideal body weight: 68.4 kg (150 lb 12.7 oz) Adjusted ideal body weight: 74.6 kg (164 lb 7.6 oz)   BMI Readings from Last 4 Encounters:  10/09/18 28.13 kg/m  09/19/18 29.50 kg/m  07/10/18 28.13 kg/m  05/25/18 27.98 kg/m   Wt Readings from Last 4 Encounters:  10/09/18 185 lb (83.9 kg)  09/19/18 194 lb (88 kg)  07/10/18 185 lb (83.9 kg)  05/25/18 184 lb (83.5 kg)  Psych/Mental status: Alert, oriented x 3 (person, place, & time)       Eyes: PERLA Respiratory: No evidence of acute respiratory distress Cervical Spine Area Exam  Skin & Axial Inspection: No masses, redness, edema, swelling, or associated skin lesions Alignment: Symmetrical Functional ROM: Unrestricted ROM      Stability: No instability detected Muscle Tone/Strength: Functionally intact. No obvious neuro-muscular anomalies detected. Sensory (Neurological): Unimpaired Palpation: No palpable anomalies                      Upper Extremity (UE) Exam    Side:Right upper extremity  Side:Left upper extremity   Skin & Extremity Inspection:Above elbow amputation (AEA)  Skin & Extremity Inspection:Skin color, temperature, and hair growth are WNL. No peripheral edema or cyanosis. No masses, redness, swelling, asymmetry, or associated skin lesions. No contractures.   Functional FMB:WGYKZLDJT ROM  Functional TSV:XBLTJQZESPQZ ROM   Muscle Tone/Strength:Deconditioned  Muscle Tone/Strength:Functionally intact. No obvious neuro-muscular anomalies detected.   Sensory (Neurological):Neuropathic pain pattern  Sensory (Neurological):Unimpaired   Palpation:No palpable anomalies  Palpation:No palpable anomalies   Provocative Test(s): Phalen's test:deferred Tinel's test:deferred Apley's scratch test (touch opposite shoulder): Action 1 (Across chest):deferred Action 2 (Overhead):deferred Action 3 (LB  reach):deferred   Provocative Test(s): Phalen's test:deferred Tinel's test:deferred Apley's scratch test (touch opposite shoulder): Action 1 (Across chest):deferred Action 2 (Overhead):deferred Action 3 (LB reach):deferred     Thoracic Spine Area Exam  Skin &  Axial Inspection:No masses, redness, or swelling Alignment:Symmetrical Functional SVX:BLTJQZESPQZR ROM Stability:No instability detected Muscle Tone/Strength:Functionally intact. No obvious neuro-muscular anomalies detected. Sensory (Neurological):Unimpaired Muscle strength & Tone:No palpable anomalies  Lumbar Spine Area Exam  Skin & Axial Inspection:No masses, redness, or swelling Alignment:Symmetrical Functional AQT:[id number] ROM Stability:No instability detected Muscle Tone/Strength:Functionally intact. No obvious neuro-muscular anomalies detected. Sensory (Neurological):Unimpaired Palpation:No palpable anomalies Provocative Tests: Lumbar Hyperextension/rotation test:deferred today Lumbar quadrant test (Kemp's test):deferred today Lumbar Lateral bending test:deferred today Patrick's Maneuver:deferred today FABER test:deferred today Thigh-thrust test:deferred today S-I compression test:deferred today S-I distraction test:deferred today  Gait & Posture Assessment  Ambulation:Limited, prosthesis in place Gait:Antalgic gait (limping) Posture:Difficulty with positional changes  Lower Extremity Exam    Side:Right lower extremity  Side:Left lower extremity  Stability:No instability observed  Stability:No instability observed  Skin & Extremity Inspection:Above knee amputation (AKA)  Skin & Extremity Inspection:Skin color, temperature, and hair growth are WNL. No peripheral edema or cyanosis. No masses, redness, swelling, asymmetry, or associated skin lesions. No contractures.   Functional BWL:SLHTDSKAJ ROM   Functional GOT:[id number] ROM   Muscle Tone/Strength:Functionally intact. No obvious neuro-muscular anomalies detected.  Muscle Tone/Strength:Functionally intact. No obvious neuro-muscular anomalies detected.  Sensory (Neurological):Neuropathic pain pattern  Sensory (Neurological):Unimpaired  Palpation:No palpable anomalies  Palpation:No palpable anomalies    Assessment   Status Diagnosis  Controlled Controlled Controlled 1. Chronic pain syndrome   2. 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 Phantom limb pain ([place])   4. Congenital neutropenia ([facility])   5. Chronic pain of right lower extremity      General Recommendations: The pain condition that the patient suffers from is best treated with a multidisciplinary approach that involves an increase in physical activity to prevent de-conditioning and worsening of the pain cycle, as well as psychological counseling (formal and/or informal) to address the co-morbid psychological affects of pain. Treatment will often involve judicious use of pain medications and interventional procedures to decrease the pain, allowing the patient to participate in the physical activity that will ultimately produce long-lasting pain reductions. The goal of the multidisciplinary approach is to return the patient to a higher level of overall function and to restore their ability to perform activities of daily living.  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 Patient also has a history of congenital neutropenia. Patient does experience phantom pain in both of his amputated extremities. His current medications include 800 mg of gabapentin 3 times a day plus an extra dose of 200 mg equaling a total dose of 2700 mg.Patient also continues his nortriptyline10 mg nightly.   Today we will refill the patient's hydrocodone as  below. PMP checked and ok, UDS up to date (will need repeat at next visit). Continue gabapentin and nortriptyline as prescribed  Plan of Care  Pharmacotherapy (Medications Ordered): Meds ordered this encounter  Medications  . HYDROcodone-acetaminophen (NORCO) 10-325 MG tablet    Sig: Take 1 tablet by mouth 2 (two) times daily as needed for up to 30 days for severe pain. For chronic pain To last for 30 days from fill date    Dispense:  60 tablet    Refill:  0  . HYDROcodone-acetaminophen (NORCO) 10-325 MG tablet    Sig: Take 1 tablet by mouth every 12 (twelve) hours as needed for up to 30 days for severe pain. Must last 30 days.    Dispense:  60 tablet    Refill:  0  [place] STOP ACT - Not applicable. Fill one day early if pharmacy is closed on scheduled refill date.  [place] HYDROcodone-acetaminophen (NORCO) 10-325 MG tablet    Sig: Take 1 tablet by mouth every 12 (twelve) hours as needed for up to 30 days for severe pain. Must last 30 days.    Dispense:  60 tablet    Refill:  0    [place] STOP ACT - Not applicable. Fill one day early if pharmacy is closed on scheduled refill date.    Provider-requested follow-up: Return in about 3 months (around 01/09/2019) for Medication Management.  Future Appointments  Date Time Provider [place]  11/23/2018  1:30 PM Volney American, [place] [facility]    Primary Care Physician: Volney American, PA-C Location: [facility] Outpatient Pain Management Facility Note by: [name], M.D Date: 10/09/2018; Time: 1:51 PM  Patient Instructions  You have been escribed 3 scripts for Hydrocodone to last until 01-12-2019

## 2018-10-09 NOTE — Progress Notes (Signed)
Nursing Pain Medication Assessment:  Safety precautions to be maintained throughout the outpatient stay will include: orient to surroundings, keep bed in low position, maintain call bell within reach at all times, provide assistance with transfer out of bed and ambulation.  Medication Inspection Compliance: Pill count conducted under aseptic conditions, in front of the patient. Neither the pills nor the bottle was removed from the patient's sight at any time. Once count was completed pills were immediately returned to the patient in their original bottle.  Medication: Hydrocodone/APAP Pill/Patch Count: 15 of 60 pills remain Pill/Patch Appearance: Markings consistent with prescribed medication Bottle Appearance: Standard pharmacy container. Clearly labeled. Filled Date: 02 / 15 / 2020 Last Medication intake:  Yesterday

## 2018-10-15 ENCOUNTER — Other Ambulatory Visit: Payer: Self-pay | Admitting: Family Medicine

## 2018-10-15 DIAGNOSIS — G546 Phantom limb syndrome with pain: Secondary | ICD-10-CM

## 2018-10-26 ENCOUNTER — Other Ambulatory Visit: Payer: Self-pay

## 2018-10-26 ENCOUNTER — Emergency Department
Admission: EM | Admit: 2018-10-26 | Discharge: 2018-10-27 | Disposition: A | Discharge status: Home / Self Care | Payer: Medicare Other | Attending: Emergency Medicine | Admitting: Emergency Medicine

## 2018-10-26 DIAGNOSIS — T18108A Unspecified foreign body in esophagus causing other injury, initial encounter: Secondary | ICD-10-CM

## 2018-10-26 DIAGNOSIS — Z79899 Other long term (current) drug therapy: Secondary | ICD-10-CM | POA: Diagnosis not present

## 2018-10-26 DIAGNOSIS — I10 Essential (primary) hypertension: Secondary | ICD-10-CM | POA: Insufficient documentation

## 2018-10-26 DIAGNOSIS — Z89221 Acquired absence of right upper limb above elbow: Secondary | ICD-10-CM | POA: Diagnosis not present

## 2018-10-26 DIAGNOSIS — R0989 Other specified symptoms and signs involving the circulatory and respiratory systems: Secondary | ICD-10-CM | POA: Diagnosis present

## 2018-10-26 DIAGNOSIS — Z89511 Acquired absence of right leg below knee: Secondary | ICD-10-CM | POA: Insufficient documentation

## 2018-10-26 DIAGNOSIS — K222 Esophageal obstruction: Secondary | ICD-10-CM | POA: Diagnosis not present

## 2018-10-26 LAB — CBC WITH DIFFERENTIAL/PLATELET
Abs Immature Granulocytes: 0.04 10*3/uL (ref 0.00–0.07)
Basophils Absolute: 0.1 10*3/uL (ref 0.0–0.1)
Basophils Relative: 1 %
EOS PCT: 6 %
Eosinophils Absolute: 0.4 10*3/uL (ref 0.0–0.5)
HEMATOCRIT: 39.2 % (ref 39.0–52.0)
Hemoglobin: 12.4 g/dL — ABNORMAL LOW (ref 13.0–17.0)
Immature Granulocytes: 1 %
LYMPHS PCT: 41 %
Lymphs Abs: 2.8 10*3/uL (ref 0.7–4.0)
MCH: 27.4 pg (ref 26.0–34.0)
MCHC: 31.6 g/dL (ref 30.0–36.0)
MCV: 86.5 fL (ref 80.0–100.0)
MONO ABS: 1.4 10*3/uL — AB (ref 0.1–1.0)
Monocytes Relative: 20 %
Neutro Abs: 2.1 10*3/uL (ref 1.7–7.7)
Neutrophils Relative %: 31 %
Platelets: 177 10*3/uL (ref 150–400)
RBC: 4.53 MIL/uL (ref 4.22–5.81)
RDW: 13.3 % (ref 11.5–15.5)
WBC: 6.8 10*3/uL (ref 4.0–10.5)
nRBC: 0 % (ref 0.0–0.2)

## 2018-10-26 LAB — COMPREHENSIVE METABOLIC PANEL
ALT: 55 U/L — ABNORMAL HIGH (ref 0–44)
AST: 59 U/L — ABNORMAL HIGH (ref 15–41)
Albumin: 4.4 g/dL (ref 3.5–5.0)
Alkaline Phosphatase: 99 U/L (ref 38–126)
Anion gap: 12 (ref 5–15)
BILIRUBIN TOTAL: 0.9 mg/dL (ref 0.3–1.2)
BUN: 13 mg/dL (ref 6–20)
CO2: 22 mmol/L (ref 22–32)
Calcium: 9.2 mg/dL (ref 8.9–10.3)
Chloride: 107 mmol/L (ref 98–111)
Creatinine, Ser: 0.82 mg/dL (ref 0.61–1.24)
GFR calc Af Amer: >60 mL/min (ref >60)
Glucose, Bld: 114 mg/dL — ABNORMAL HIGH (ref 70–99)
Potassium: 4.7 mmol/L (ref 3.5–5.1)
Sodium: 141 mmol/L (ref 135–145)
TOTAL PROTEIN: 7.3 g/dL (ref 6.5–8.1)

## 2018-10-26 MED ORDER — GLUCAGON HCL RDNA (DIAGNOSTIC) 1 MG IJ SOLR
1.0000 mg | Freq: Once | INTRAMUSCULAR | Status: AC
  Administered 2018-10-26: 1 mg via INTRAVENOUS
  Filled 2018-10-26 (×2): qty 1

## 2018-10-26 MED ORDER — ONDANSETRON HCL 4 MG/2ML IJ SOLN
4.0000 mg | Freq: Once | INTRAMUSCULAR | Status: AC
  Administered 2018-10-26: 4 mg via INTRAVENOUS
  Filled 2018-10-26: qty 2

## 2018-10-26 MED ORDER — SODIUM CHLORIDE 0.9 % IV BOLUS
1000.0000 mL | Freq: Once | INTRAVENOUS | Status: AC
  Administered 2018-10-26: 1000 mL via INTRAVENOUS

## 2018-10-26 NOTE — ED Provider Notes (Signed)
[facility] Emergency Department Provider Note   ____________________________________________   First MD Initiated Contact with Patient 10/26/18 2345     (approximate)  I have reviewed the triage vital signs and the nursing notes.   HISTORY  Chief Complaint sensation of food stuck in esophagus    HPI Brandon Myers is a 53 y.o. male who presents to the ED from home with a chief complaint of esophageal food bolus.  Patient has a history of esophageal stricture requiring dilatation; it has been several years since he is he last had this done.  States he was eating chicken tonight at 6:30 PM when he felt like chicken "got hung up in my esophagus".  Patient tried to drink some water and induce vomiting.  States he is having difficulty swallowing his saliva.  Denies associated fever, chills, cough, congestion, chest pain, shortness of breath, abdominal pain, diarrhea.  Denies recent travel or trauma.  Denies exposure to persons diagnosed with coronavirus.       Past Medical History:  Diagnosis Date  . Allergy   . Controlled substance agreement signed 04/02/2017  . GERD (gastroesophageal reflux disease)   . Hypertension   . Neutropenia, congenital [facility])     Patient Active Problem List   Diagnosis Date Noted  . Chronic pain syndrome 12/14/2017  . Hyperlipidemia 12/06/2016  . ACE-inhibitor cough 10/04/2016  . Chronic pain of right lower extremity 10/04/2016  . Visual floaters, bilateral 07/01/2016  . Phantom limb pain (HCC) 10/29/2015  . 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 Tendonitis of wrist, left 08/31/2015  . Essential hypertension 04/27/2015  . Seasonal allergic rhinitis 04/27/2015  . Esophageal reflux 04/27/2015  . 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 ABOVE ELBOW ARM AMPUTATION Right 1993  .  AMPUTATION    . APPENDECTOMY    . COLON SURGERY  1994  . LEG AMPUTATION THROUGH KNEE Right 1993    Prior to Admission medications   Medication Sig Start Date End Date Taking? Authorizing Provider  amoxicillin-clavulanate (AUGMENTIN) 875-125 MG tablet Take 1 tablet by mouth 2 (two) times daily. Patient not taking: Reported on 10/09/2018 09/19/18   Particia Nearing, PA-C  Filgrastim-sndz [facility]) 480 MCG/0.8ML SOSY Inject 0.8 mLs into the muscle every other day. 10/29/14   [provider]  fluticasone (FLONASE) 50 MCG/ACT nasal spray INSTILL 1 TO 2 SPRAYS IN EACH NOSTRIL EVERY NIGHT AT BEDTIME x 2-3 weeks 12/30/16   Galen Manila, NP  gabapentin (NEURONTIN) 300 MG capsule TAKE ONE CAPSULE BY MOUTH ONCE DAILY( MID DAY) 10/15/18   Olevia Perches P, DO  gabapentin (NEURONTIN) 800 MG tablet Take 1 tablet (800 mg total) by mouth 3 (three) times daily. 01/09/18   Johnson, Megan P, DO  hydrochlorothiazide (HYDRODIURIL) 12.5 MG tablet TAKE 1 TABLET(12.5 MG) BY MOUTH DAILY 04/26/18   Particia Nearing, PA-C  HYDROcodone-acetaminophen [facility]) 10-325 MG tablet Take 1 tablet by mouth 2 (two) times daily as needed for up to 30 days for severe pain. For chronic pain To last for 30 days from fill date 10/14/18 11/13/18  Edward Jolly, MD  HYDROcodone-acetaminophen (NORCO) 10-325 MG tablet Take 1 tablet by mouth every 12 (twelve) hours as needed for up to 30 days for severe pain. Must last 30 days. 11/13/18 12/13/18  Edward Jolly, MD  HYDROcodone-acetaminophen (NORCO) 10-325 MG tablet Take 1  tablet by mouth every 12 (twelve) hours as needed for up to 30 days for severe pain. Must last 30 days. 12/13/18 01/12/19  Edward Jolly, MD  loratadine (CLARITIN) 10 MG tablet Take 1 tablet (10 mg total) by mouth daily. 01/09/18   Johnson, Megan P, DO  losartan (COZAAR) 100 MG tablet TAKE 1 TABLET(100 MG) BY MOUTH DAILY 06/11/18   Johnson, Megan P, DO  Melatonin 10 MG TABS Take 10 mg by mouth at bedtime.     [provider]  meloxicam (MOBIC) 7.5 MG tablet TAKE 1 TABLET(7.5 MG) BY MOUTH DAILY 02/14/18   Particia Nearing, PA-C  metoprolol succinate (TOPROL-XL) 50 MG 24 hr tablet TAKE 1 TABLET BY MOUTH DAILY WITH OR IMMEDIATELY FOLLOWING A MEAL 08/23/18   Particia Nearing, PA-C  nortriptyline (PAMELOR) 10 MG capsule TAKE 1 CAPSULE(10 MG) BY MOUTH AT BEDTIME 09/10/18   Johnson, Megan P, DO  omeprazole (PRILOSEC) 40 MG capsule Take 1 capsule (40 mg total) by mouth daily. 01/09/18   Johnson, Megan P, DO  VENTOLIN HFA 108 (90 Base) MCG/ACT inhaler INHALE 2 PUFFS BY MOUTH FOUR TIMES DAILY AS NEEDED 07/11/16   Janeann Forehand., MD    Allergies Enalapril; Prochlorperazine; Sulfa antibiotics; Benadryl [diphenhydramine]; and Strawberry extract  Family History  Problem Relation Age of Onset  . Heart Problems Mother   . Asthma Mother   . Depression Mother   . Thyroid disease Mother   . Alzheimer's disease Mother   . Stroke Maternal Grandmother   . Cancer Father        skin  . Cancer Paternal Grandmother   . Cancer Paternal Grandfather     Social History Social History   Tobacco Use  . Smoking status: Never Smoker  . Smokeless tobacco: Never Used  Substance Use Topics  . Alcohol use: No    Alcohol/week: 0.0 standard drinks  . Drug use: No    Review of Systems  Constitutional: No fever/chills Eyes: No visual changes. ENT: Positive for esophageal foreign body sensation.  No sore throat. Cardiovascular: Denies chest pain. Respiratory: Denies shortness of breath. Gastrointestinal: No abdominal pain.  No nausea, no vomiting.  No diarrhea.  No constipation. Genitourinary: Negative for dysuria. Musculoskeletal: Negative for back pain. Skin: Negative for rash. Neurological: Negative for headaches, focal weakness or numbness.   ____________________________________________   PHYSICAL EXAM:  VITAL SIGNS: ED Triage Vitals  Enc Vitals Group     BP 10/26/18 2300 (!)  154/98     Pulse Rate 10/26/18 2300 88     Resp 10/26/18 2300 16     Temp 10/26/18 2300 98.2 F (36.8 C)     Temp Source 10/26/18 2300 Oral     SpO2 10/26/18 2300 97 %     Weight 10/26/18 2301 185 lb (83.9 kg)     Height 10/26/18 2301 5\' 8"  (1.727 m)     Head Circumference --      Peak Flow --      Pain Score 10/26/18 2300 2     Pain Loc --      Pain Edu? --      Excl. in GC? --     Constitutional: Alert and oriented. Well appearing and in no acute distress. Eyes: Conjunctivae are normal. PERRL. EOMI. Head: Atraumatic. Nose: No congestion/rhinnorhea. Mouth/Throat: Mucous membranes are moist.  No foreign body noted in posterior oropharynx. Neck: No stridor.  No palpable mass. Cardiovascular: Normal rate, regular rhythm. Grossly normal heart sounds.  Good  peripheral circulation. Respiratory: Normal respiratory effort.  No retractions. Lungs CTAB. Gastrointestinal: Soft and nontender. No distention. No abdominal bruits. No CVA tenderness. Musculoskeletal: Right above elbow arm amputation/right BKA.  No lower extremity tenderness nor edema.  No joint effusions. Neurologic:  Normal speech and language. No gross focal neurologic deficits are appreciated. No gait instability. Skin:  Skin is warm, dry and intact. No rash noted. Psychiatric: Mood and affect are normal. Speech and behavior are normal.  ____________________________________________   LABS (all labs ordered are listed, but only abnormal results are displayed)  Labs Reviewed  CBC WITH DIFFERENTIAL/PLATELET - Abnormal; Notable for the following components:      Result Value   Hemoglobin 12.4 (*)    Monocytes Absolute 1.4 (*)    All other components within normal limits  COMPREHENSIVE METABOLIC PANEL - Abnormal; Notable for the following components:   Glucose, Bld 114 (*)    AST 59 (*)    ALT 55 (*)    All other components within normal limits   ____________________________________________  EKG  ED ECG REPORT I,  SUNG,JADE J, the attending physician, personally viewed and interpreted this ECG.   Date: 10/27/2018  EKG Time: 2310  Rate: 84  Rhythm: normal EKG, normal sinus rhythm  Axis: Normal  Intervals:none  ST&T Change: Nonspecific  ____________________________________________  RADIOLOGY  ED MD interpretation: No foreign body visualized  Official radiology report(s): Dg Neck Soft Tissue  Result Date: 10/27/2018 CLINICAL DATA:  Esophageal foreign body. Foreign body sensation after eating chicken. Difficulty swallowing. EXAM: NECK SOFT TISSUES - 1+ VIEW COMPARISON:  None. FINDINGS: There is no evidence of retropharyngeal soft tissue swelling or epiglottic enlargement. The cervical airway is unremarkable. No subcutaneous emphysema. Soft tissues are non suspicious. No radio-opaque foreign body identified. Patient is edentulous. IMPRESSION: Negative soft tissue neck radiographs. No radiopaque foreign body in the neck. Electronically Signed   By: Narda Rutherford M.D.   On: 10/27/2018 00:37    ____________________________________________   PROCEDURES  Procedure(s) performed (including Critical Care):  Procedures   ____________________________________________   INITIAL IMPRESSION / ASSESSMENT AND PLAN / ED COURSE  As part of my medical decision making, I reviewed the following data within the electronic MEDICAL RECORD NUMBER Nursing notes reviewed and incorporated, Labs reviewed, Old chart reviewed, Radiograph reviewed and Notes from prior ED visits        53 year old male with history of esophageal stricture requiring dilatation who presents with sensation of esophageal food bolus.  After glucagon, patient feels like sensation is in his substernal chest/epigastric area.  Still afraid to drink anything for fear vomiting.  We will add nitroglycerin and 1 mg IV Ativan for smooth muscle relaxation and try carbonated beverage.   Clinical Course as of Oct 26 117  Sat Oct 27, 2018  0117 Patient  able to sip ginger ale without vomiting.  Tolerating secretions well.  States it does feel like ginger ale is passing slowly.  However, he is not vomiting or otherwise demonstrating esophageal food bolus in the esophagus which would be amenable to endoscopy.  Will prescribe low-dose Valium to use as needed for both smooth muscle relaxation as well as his anxiety.  It has been a number of years since he has seen GI and he does not remember who has seen previously.  Will refer to GI as an outpatient for follow-up.  I have recommended to the patient that he drink liquids and bland his food and avoid blood products.  Strict return precautions given.  Patient verbalizes understanding agrees with plan of care.   [JS]    Clinical Course User Index [JS] Irean Hong, MD     ____________________________________________   FINAL CLINICAL IMPRESSION(S) / ED DIAGNOSES  Final diagnoses:  Esophageal stricture  Foreign body in esophagus, initial encounter     ED Discharge Orders    None       Note:  This document was prepared using Dragon voice recognition software and may include unintentional dictation errors.   Irean Hong, MD 10/27/18 [phone]

## 2018-10-26 NOTE — ED Triage Notes (Signed)
Pt states was eating chicken at 1830 when he felt like "I got chicken hung up in my esophagus". Pt states has had to have his esophagus dilated before. Pt states he is having difficulty swallowing his saliva, but that is not noted in triage at this time. Pt denies shob, dizziness.

## 2018-10-27 ENCOUNTER — Emergency Department: Payer: Medicare Other

## 2018-10-27 MED ORDER — DIAZEPAM 2 MG PO TABS: 2.0000 mg | ORAL_TABLET | Freq: Three times a day (TID) | ORAL | 0 refills | Status: DC | PRN

## 2018-10-27 MED ORDER — LORAZEPAM 2 MG/ML IJ SOLN
1.0000 mg | Freq: Once | INTRAMUSCULAR | Status: AC
  Administered 2018-10-27: 1 mg via INTRAVENOUS
  Filled 2018-10-27: qty 1

## 2018-10-27 MED ORDER — NITROGLYCERIN 0.4 MG SL SUBL
0.4000 mg | SUBLINGUAL_TABLET | Freq: Once | SUBLINGUAL | Status: AC
  Administered 2018-10-27: 0.4 mg via SUBLINGUAL
  Filled 2018-10-27: qty 1

## 2018-10-27 NOTE — ED Notes (Signed)
Pt drinking ginger ale tolerating well small sips of water no distress noted

## 2018-10-27 NOTE — ED Notes (Signed)
Pt verbalizes understanding of discharge instructions.

## 2018-10-27 NOTE — Discharge Instructions (Signed)
1.  You may take Valium as needed for muscle relaxation. 2.  Drink liquids and blander foods.  Avoid large chunks of meat and bread products. 3.  Return to the ER for worsening symptoms, persistent vomiting, difficulty breathing or other concerns.

## 2018-11-21 ENCOUNTER — Other Ambulatory Visit: Payer: Self-pay | Admitting: Family Medicine

## 2018-11-22 ENCOUNTER — Telehealth: Payer: Self-pay | Admitting: Family Medicine

## 2018-11-22 NOTE — Telephone Encounter (Signed)
Called pt to set up virtual visit, no answer, left voicemail. °

## 2018-11-23 ENCOUNTER — Ambulatory Visit (INDEPENDENT_AMBULATORY_CARE_PROVIDER_SITE_OTHER): Payer: Medicare Other | Admitting: Family Medicine

## 2018-11-23 ENCOUNTER — Other Ambulatory Visit: Payer: Self-pay | Admitting: Family Medicine

## 2018-11-23 ENCOUNTER — Other Ambulatory Visit: Payer: Self-pay

## 2018-11-23 ENCOUNTER — Encounter: Payer: Self-pay | Admitting: Family Medicine

## 2018-11-23 VITALS — BP 125/91 | HR 72 | Ht 68.0 in | Wt 185.0 lb

## 2018-11-23 DIAGNOSIS — E782 Mixed hyperlipidemia: Secondary | ICD-10-CM | POA: Diagnosis not present

## 2018-11-23 DIAGNOSIS — G894 Chronic pain syndrome: Secondary | ICD-10-CM

## 2018-11-23 DIAGNOSIS — H6991 Unspecified Eustachian tube disorder, right ear: Secondary | ICD-10-CM

## 2018-11-23 DIAGNOSIS — G546 Phantom limb syndrome with pain: Secondary | ICD-10-CM

## 2018-11-23 DIAGNOSIS — J301 Allergic rhinitis due to pollen: Secondary | ICD-10-CM

## 2018-11-23 DIAGNOSIS — K219 Gastro-esophageal reflux disease without esophagitis: Secondary | ICD-10-CM | POA: Diagnosis not present

## 2018-11-23 DIAGNOSIS — H6981 Other specified disorders of Eustachian tube, right ear: Secondary | ICD-10-CM

## 2018-11-23 DIAGNOSIS — I10 Essential (primary) hypertension: Secondary | ICD-10-CM

## 2018-11-23 DIAGNOSIS — D700 Congenital agranulocytosis: Secondary | ICD-10-CM

## 2018-11-23 MED ORDER — FLUTICASONE PROPIONATE 50 MCG/ACT NA SUSP: NASAL | 12 refills | Status: DC

## 2018-11-23 MED ORDER — OMEPRAZOLE 40 MG PO CPDR: 40.0000 mg | DELAYED_RELEASE_CAPSULE | Freq: Every day | ORAL | 3 refills | Status: DC

## 2018-11-23 MED ORDER — METOPROLOL SUCCINATE ER 50 MG PO TB24: 50.0000 mg | ORAL_TABLET | Freq: Every day | ORAL | 1 refills | Status: DC

## 2018-11-23 MED ORDER — NORTRIPTYLINE HCL 10 MG PO CAPS: 10.0000 mg | ORAL_CAPSULE | Freq: Every day | ORAL | 1 refills | Status: DC

## 2018-11-23 MED ORDER — LOSARTAN POTASSIUM 100 MG PO TABS: ORAL_TABLET | ORAL | 1 refills | Status: DC

## 2018-11-23 MED ORDER — HYDROCHLOROTHIAZIDE 25 MG PO TABS: 25.0000 mg | ORAL_TABLET | Freq: Every day | ORAL | 0 refills | Status: DC

## 2018-11-23 MED ORDER — GABAPENTIN 800 MG PO TABS: 800.0000 mg | ORAL_TABLET | Freq: Three times a day (TID) | ORAL | 3 refills | Status: DC

## 2018-11-23 MED ORDER — LORATADINE 10 MG PO TABS: 10.0000 mg | ORAL_TABLET | Freq: Every day | ORAL | 3 refills | Status: DC

## 2018-11-23 MED ORDER — MELOXICAM 7.5 MG PO TABS: 7.5000 mg | ORAL_TABLET | Freq: Every day | ORAL | 1 refills | Status: DC

## 2018-11-23 NOTE — Progress Notes (Signed)
BP (!) 125/91 (BP Location: Left Arm, Patient Position: Sitting, Cuff Size: Normal)   Pulse 72   Ht 5\' 8"  (1.727 m)   Wt 185 lb (83.9 kg)   BMI 28.13 kg/m    Subjective:    Patient ID: Brandon Myers, male    DOB: 1966-05-10, 53 y.o.   MRN: [id number]  HPI: Brandon Myers is a 53 y.o. male  Chief Complaint  Patient presents with  . Hypertension    6 month F/U. No complaints.  . Hyperlipidemia    . This visit was completed via telephone due to the restrictions of the COVID-19 pandemic. All issues as above were discussed and addressed but no physical exam was performed. If it was felt that the patient should be evaluated in the office, they were directed there. The patient verbally consented to this visit. Patient was unable to complete an audio/visual visit due to Technical difficulties,Lack of internet. Due to the catastrophic nature of the COVID-19 pandemic, this visit was done through audio contact only. . Location of the patient: home . Location of the provider: work . Those involved with this call:  . Provider: Roosvelt Maser, PA-C . CMA: Wilhemena Durie, CMA . Front Desk/Registration: Harriet Pho  . Time spent on call: 25 minutes with patient face to face via video conference. More than 50% of this time was spent in counseling and coordination of care. 10 minutes total spent in review of patient's record and preparation of their chart. I verified patient identity using two factors (patient name and date of birth). Patient consents verbally to being seen via telemedicine visit today.   Presenting today for 6 month f/u. No new concerns. Taking medications faithfully without side effects. Denies Cp, SOB, HAs, dizziness, claudication, myalgias.   HTN - Home readings are typically in the 120s/80s-90s. Currently on metoprolol, HCTZ and losartan. Tries to loosely follow DASH diet. Does not exercise due to chronic pain.   HLD - diet controlled, trying to watch what he eats   Allergies - taking claritin and flonase, intermittent pressure in right ear but otherwise feels things are under good control with this regimen.   GERD - taking prilosec daily with good relief. No breakthrough sxs, hematemesis, abdominal pain.   Followed by Pain Management for chronic pain syndrome and issues post two amputations with phantom limb pain. Stable on hydrocodone, gabapentin, meloxicam and nortriptyline.   Relevant past medical, surgical, family and social history reviewed and updated as indicated. Interim medical history since our last visit reviewed. Allergies and medications reviewed and updated.  Review of Systems  Per HPI unless specifically indicated above     Objective:    BP (!) 125/91 (BP Location: Left Arm, Patient Position: Sitting, Cuff Size: Normal)   Pulse 72   Ht 5\' 8"  (1.727 m)   Wt 185 lb (83.9 kg)   BMI 28.13 kg/m   Wt Readings from Last 3 Encounters:  11/23/18 185 lb (83.9 kg)  10/26/18 185 lb (83.9 kg)  10/09/18 185 lb (83.9 kg)    Physical Exam Unable to perform PE as he did not have video capabilities for today's visit.  Results for orders placed or performed during the hospital encounter of 10/26/18  CBC with Differential  Result Value Ref Range   WBC 6.8 4.0 - 10.5 K/uL   RBC 4.53 4.22 - 5.81 MIL/uL   Hemoglobin 12.4 (L) 13.0 - 17.0 g/dL   HCT 49.7 02.6 - 37.8 %   MCV 86.5  80.0 - 100.0 fL   MCH 27.4 26.0 - 34.0 pg   MCHC 31.6 30.0 - 36.0 g/dL   RDW 16.1 09.6 - 04.5 %   Platelets 177 150 - 400 K/uL   nRBC 0.0 0.0 - 0.2 %   Neutrophils Relative % 31 %   Neutro Abs 2.1 1.7 - 7.7 K/uL   Lymphocytes Relative 41 %   Lymphs Abs 2.8 0.7 - 4.0 K/uL   Monocytes Relative 20 %   Monocytes Absolute 1.4 (H) 0.1 - 1.0 K/uL   Eosinophils Relative 6 %   Eosinophils Absolute 0.4 0.0 - 0.5 K/uL   Basophils Relative 1 %   Basophils Absolute 0.1 0.0 - 0.1 K/uL   Immature Granulocytes 1 %   Abs Immature Granulocytes 0.04 0.00 - 0.07 K/uL   Comprehensive metabolic panel  Result Value Ref Range   Sodium 141 135 - 145 mmol/L   Potassium 4.7 3.5 - 5.1 mmol/L   Chloride 107 98 - 111 mmol/L   CO2 22 22 - 32 mmol/L   Glucose, Bld 114 (H) 70 - 99 mg/dL   BUN 13 6 - 20 mg/dL   Creatinine, Ser 4.09 0.61 - 1.24 mg/dL   Calcium 9.2 8.9 - 81.1 mg/dL   Total Protein 7.3 6.5 - 8.1 g/dL   Albumin 4.4 3.5 - 5.0 g/dL   AST 59 (H) 15 - 41 U/L   ALT 55 (H) 0 - 44 U/L   Alkaline Phosphatase 99 38 - 126 U/L   Total Bilirubin 0.9 0.3 - 1.2 mg/dL   GFR calc non Af Amer >60 >60 mL/min   GFR calc Af Amer >60 >60 mL/min   Anion gap 12 5 - 15      Assessment & Plan:   Problem List Items Addressed This Visit      Cardiovascular and Mediastinum   Essential hypertension - Primary    Diastolic mildly high today but typically home readings under good control. Cannot recheck manually given virtual visit. Continue current regimen and will follow up on this and make adjustments if needed at next OV during in person evaluation. Call with persistent home abnormal readings in meantime, continue current regimen      Relevant Medications   metoprolol succinate (TOPROL-XL) 50 MG 24 hr tablet   losartan (COZAAR) 100 MG tablet     Respiratory   Seasonal allergic rhinitis    Under fairly good control with current regimen, continue this regimen      Relevant Medications   loratadine (CLARITIN) 10 MG tablet   fluticasone (FLONASE) 50 MCG/ACT nasal spray     Digestive   Esophageal reflux    Stable and under good control, continue current regimen      Relevant Medications   omeprazole (PRILOSEC) 40 MG capsule     Nervous and Auditory   Phantom limb pain (HCC)    Followed by Pain Clinic, continue current regimen per their direction      Relevant Medications   nortriptyline (PAMELOR) 10 MG capsule   gabapentin (NEURONTIN) 800 MG tablet     Other   Congenital agranulocytosis (HCC)    Followed by Hematology      Congenital neutropenia  (HCC)    Followed by Hematology      Hyperlipidemia    Recheck lipids, adjust as needed. Continue working on lifestyle modifications      Relevant Medications   metoprolol succinate (TOPROL-XL) 50 MG 24 hr tablet   losartan (COZAAR) 100 MG tablet  Chronic pain syndrome    Followed by Pain management, continue current regimen per their directions      Relevant Medications   nortriptyline (PAMELOR) 10 MG capsule   meloxicam (MOBIC) 7.5 MG tablet   gabapentin (NEURONTIN) 800 MG tablet    Other Visit Diagnoses    Essential hypertension, malignant       Relevant Medications   metoprolol succinate (TOPROL-XL) 50 MG 24 hr tablet   losartan (COZAAR) 100 MG tablet   Dysfunction of right eustachian tube       Relevant Medications   fluticasone (FLONASE) 50 MCG/ACT nasal spray       Follow up plan: Return in about 6 months (around 05/25/2019) for CPE.

## 2018-11-23 NOTE — Telephone Encounter (Signed)
Requested medication (s) are due for refill today:  Yes   Requested medication (s) are on the active medication list:  Yes   Future visit scheduled:  No  Last Refill: today; rec'd #30; no refills.    Note to clinic:  The pharmacy is asking for 90 day supply; since progress note not done, unsure if he was intentionally only given #30; no refills.    Requested Prescriptions  Pending Prescriptions Disp Refills   hydrochlorothiazide (HYDRODIURIL) 25 MG tablet [Pharmacy Med Name: HYDROCHLOROTHIAZIDE 25MG  TABLETS] 90 tablet     Sig: TAKE 1 TABLET(25 MG) BY MOUTH DAILY     Cardiovascular: Diuretics - Thiazide Failed - 11/23/2018  3:45 PM      Failed - Last BP in normal range    BP Readings from Last 1 Encounters:  11/23/18 (!) 125/91         Passed - Ca in normal range and within 360 days    Calcium  Date Value Ref Range Status  10/26/2018 9.2 8.9 - 10.3 mg/dL Final         Passed - Cr in normal range and within 360 days    Creat  Date Value Ref Range Status  12/09/2016 0.79 0.60 - 1.35 mg/dL Final   Creatinine, Ser  Date Value Ref Range Status  10/26/2018 0.82 0.61 - 1.24 mg/dL Final         Passed - K in normal range and within 360 days    Potassium  Date Value Ref Range Status  10/26/2018 4.7 3.5 - 5.1 mmol/L Final    Comment:    HEMOLYSIS AT THIS LEVEL MAY AFFECT RESULT         Passed - Na in normal range and within 360 days    Sodium  Date Value Ref Range Status  10/26/2018 141 135 - 145 mmol/L Final  05/25/2018 141 134 - 144 mmol/L Final         Passed - Valid encounter within last 6 months    Recent Outpatient Visits          Today Essential hypertension   [facility] Particia Nearing, PA-C   2 months ago Acute maxillary sinusitis, recurrence not specified   [facility] Particia Nearing, [place]   6 months ago Essential hypertension   [facility] Particia Nearing, [place]   7 months ago Essential  hypertension   [facility] Roosvelt Maser [place], [place]   8 months ago Essential hypertension   [facility] Roosvelt Maser [place], [place]

## 2018-11-27 ENCOUNTER — Other Ambulatory Visit: Payer: Medicare Other

## 2018-11-27 ENCOUNTER — Other Ambulatory Visit: Payer: Self-pay

## 2018-11-27 DIAGNOSIS — E782 Mixed hyperlipidemia: Secondary | ICD-10-CM

## 2018-11-27 DIAGNOSIS — I10 Essential (primary) hypertension: Secondary | ICD-10-CM

## 2018-11-27 DIAGNOSIS — D700 Congenital agranulocytosis: Secondary | ICD-10-CM

## 2018-11-28 LAB — COMPREHENSIVE METABOLIC PANEL
ALT: 46 IU/L — ABNORMAL HIGH (ref 0–44)
AST: 38 IU/L (ref 0–40)
Albumin/Globulin Ratio: 1.7 (ref 1.2–2.2)
Albumin: 4.5 g/dL (ref 4.0–5.0)
Alkaline Phosphatase: 126 IU/L — ABNORMAL HIGH (ref 39–117)
BUN/Creatinine Ratio: 19 (ref 9–20)
BUN: 15 mg/dL (ref 6–24)
Bilirubin Total: 0.3 mg/dL (ref 0.0–1.2)
CO2: 23 mmol/L (ref 20–29)
Calcium: 9.7 mg/dL (ref 8.7–10.2)
Chloride: 100 mmol/L (ref 96–106)
Creatinine, Ser: 0.78 mg/dL (ref 0.76–1.27)
GFR calc Af Amer: 122 mL/min/{1.73_m2} (ref >59)
GFR calc non Af Amer: 105 mL/min/{1.73_m2} (ref >59)
Globulin, Total: 2.7 g/dL (ref 1.5–4.5)
Glucose: 92 mg/dL (ref 65–99)
Potassium: 4.2 mmol/L (ref 3.5–5.2)
Sodium: 139 mmol/L (ref 134–144)
Total Protein: 7.2 g/dL (ref 6.0–8.5)

## 2018-11-28 LAB — CBC WITH DIFFERENTIAL/PLATELET
Basophils Absolute: 0.2 10*3/uL (ref 0.0–0.2)
Basos: 3 %
EOS (ABSOLUTE): 0.5 10*3/uL — ABNORMAL HIGH (ref 0.0–0.4)
Eos: 9 %
Hematocrit: 41.8 % (ref 37.5–51.0)
Hemoglobin: 14.1 g/dL (ref 13.0–17.7)
Immature Grans (Abs): 0 10*3/uL (ref 0.0–0.1)
Immature Granulocytes: 0 %
Lymphocytes Absolute: 1.9 10*3/uL (ref 0.7–3.1)
Lymphs: 37 %
MCH: 27.9 pg (ref 26.6–33.0)
MCHC: 33.7 g/dL (ref 31.5–35.7)
MCV: 83 fL (ref 79–97)
Monocytes Absolute: 1.3 10*3/uL — ABNORMAL HIGH (ref 0.1–0.9)
Monocytes: 24 %
Neutrophils Absolute: 1.5 10*3/uL (ref 1.4–7.0)
Neutrophils: 27 %
Platelets: 200 10*3/uL (ref 150–450)
RBC: 5.05 x10E6/uL (ref 4.14–5.80)
RDW: 13.1 % (ref 11.6–15.4)
WBC: 5.4 10*3/uL (ref 3.4–10.8)

## 2018-11-28 LAB — LIPID PANEL W/O CHOL/HDL RATIO
Cholesterol, Total: 125 mg/dL (ref 100–199)
HDL: 23 mg/dL — ABNORMAL LOW (ref >39)
LDL Calculated: 59 mg/dL (ref 0–99)
Triglycerides: 214 mg/dL — ABNORMAL HIGH (ref 0–149)
VLDL Cholesterol Cal: 43 mg/dL — ABNORMAL HIGH (ref 5–40)

## 2018-11-28 NOTE — Assessment & Plan Note (Signed)
Under fairly good control with current regimen, continue this regimen

## 2018-11-28 NOTE — Assessment & Plan Note (Signed)
Followed by Hematology 

## 2018-11-28 NOTE — Assessment & Plan Note (Signed)
Recheck lipids, adjust as needed. Continue working on lifestyle modifications 

## 2018-11-28 NOTE — Assessment & Plan Note (Signed)
Followed by Pain management, continue current regimen per their directions

## 2018-11-28 NOTE — Assessment & Plan Note (Signed)
Stable and under good control, continue current regimen 

## 2018-11-28 NOTE — Assessment & Plan Note (Signed)
Diastolic mildly high today but typically home readings under good control. Cannot recheck manually given virtual visit. Continue current regimen and will follow up on this and make adjustments if needed at next OV during in person evaluation. Call with persistent home abnormal readings in meantime, continue current regimen

## 2018-11-28 NOTE — Assessment & Plan Note (Signed)
Followed by Pain Clinic, continue current regimen per their direction

## 2018-12-14 ENCOUNTER — Telehealth: Payer: Self-pay | Admitting: Family Medicine

## 2018-12-14 NOTE — Telephone Encounter (Signed)
Called and left VM to return call. If he calls back, please let him know that he can cut back to 12.5 mg as the higher dose may be dropping his readings too low. He should call with any persistent symptoms after this change is made

## 2018-12-14 NOTE — Telephone Encounter (Signed)
Copied from CRM [id number]. Topic: General - Inquiry >> Dec 14, 2018 10:37 AM Deborha Payment wrote: Reason for CRM: Patient wife Nicholos Johns called stating that medication hydrochlorothiazide (HYDRODIURIL) 25 MG tablet is making patient sweat and his feet with be cold.  He will get hot and sweat all over then be freezing.  Patient was prescribed medication 4/24 with a higher dose.  Patient would like PCP or nurse to call back to discuss medication and other options.  Patient call back # [phone]

## 2018-12-14 NOTE — Telephone Encounter (Signed)
Called patient and left a message for him to return my call.

## 2018-12-17 NOTE — Telephone Encounter (Signed)
Called patient, no answer, left a message for patient to return my call.  

## 2018-12-17 NOTE — Telephone Encounter (Signed)
Ok, we can recheck how he's doing without it at his upcoming f/u next week

## 2018-12-17 NOTE — Telephone Encounter (Signed)
FYI Rachel.  

## 2018-12-17 NOTE — Telephone Encounter (Signed)
Patient states that he was taking a lower dose and it caused the same symptoms, patient has discontinued the medication.

## 2018-12-26 ENCOUNTER — Encounter: Payer: Self-pay | Admitting: Family Medicine

## 2018-12-27 ENCOUNTER — Other Ambulatory Visit: Payer: Self-pay

## 2018-12-27 ENCOUNTER — Other Ambulatory Visit: Payer: Self-pay | Admitting: Family Medicine

## 2018-12-27 ENCOUNTER — Ambulatory Visit (INDEPENDENT_AMBULATORY_CARE_PROVIDER_SITE_OTHER): Payer: Medicare Other | Admitting: Family Medicine

## 2018-12-27 ENCOUNTER — Encounter: Payer: Self-pay | Admitting: Family Medicine

## 2018-12-27 VITALS — BP 130/94 | HR 82 | Temp 98.9°F | Ht 68.0 in | Wt 191.0 lb

## 2018-12-27 DIAGNOSIS — I10 Essential (primary) hypertension: Secondary | ICD-10-CM

## 2018-12-27 MED ORDER — METOPROLOL SUCCINATE ER 100 MG PO TB24: 100.0000 mg | ORAL_TABLET | Freq: Every day | ORAL | 0 refills | Status: DC

## 2018-12-27 NOTE — Assessment & Plan Note (Signed)
D/C HCTZ due to side effects. Increase metoprolol to 100 mg XL and monitor home readings closely. Patient aware to call with any dizziness or faintness as well as any abnormal readings. Recheck in 1 month

## 2018-12-27 NOTE — Progress Notes (Signed)
BP (!) 130/94   Pulse 82   Temp 98.9 F (37.2 C) (Oral)   Ht 5\' 8"  (1.727 m)   Wt 191 lb (86.6 kg)   SpO2 95%   BMI 29.04 kg/m    Subjective:    Patient ID: Brandon GrapesKevin L Galvao, male    DOB: 01/13/1966, 53 y.o.   MRN: [id number]  HPI: Brandon Myers is a 53 y.o. male  Chief Complaint  Patient presents with  . Hypertension    medication f/u pt states that the hydrochlorothiazide is making him feel extremely hot   Here today to discuss side effects to HCTZ. Hot flushes, sweats for several hours after taking it. Stopped taking the medicine about 2 weeks ago which helped with his sxs. Has not been following home BPs very often, does not remember what his last reading was. Currently still taking metoprolol 50 mg and losartan, which he tolerates well. 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 Denies CP, SOB, dizziness, HAs.   Relevant past medical, surgical, family and social history reviewed and updated as indicated. Interim medical history since our last visit reviewed. Allergies and medications reviewed and updated.  Review of Systems  Per HPI unless specifically indicated above     Objective:    BP (!) 130/94   Pulse 82   Temp 98.9 F (37.2 C) (Oral)   Ht 5\' 8"  (1.727 m)   Wt 191 lb (86.6 kg)   SpO2 95%   BMI 29.04 kg/m   Wt Readings from Last 3 Encounters:  12/27/18 191 lb (86.6 kg)  11/23/18 185 lb (83.9 kg)  10/26/18 185 lb (83.9 kg)    Physical Exam Vitals signs and nursing note reviewed.  Constitutional:      Appearance: Normal appearance.  HENT:     Head: Atraumatic.  Eyes:     Extraocular Movements: Extraocular movements intact.     Conjunctiva/sclera: Conjunctivae normal.  Neck:     Musculoskeletal: Normal range of motion and neck supple.  Cardiovascular:     Rate and Rhythm: Normal rate and regular rhythm.  Pulmonary:     Effort: Pulmonary effort is normal.     Breath sounds: Normal breath  sounds.  Musculoskeletal:     Comments: ROM at baseline  Skin:    General: Skin is warm and dry.  Neurological:     General: No focal deficit present.     Mental Status: He is oriented to person, place, and time.  Psychiatric:        Mood and Affect: Mood normal.        Thought Content: Thought content normal.        Judgment: Judgment normal.     Results for orders placed or performed in visit on 11/27/18  CBC with Differential/Platelet  Result Value Ref Range   WBC 5.4 3.4 - 10.8 x10E3/uL   RBC 5.05 4.14 - 5.80 x10E6/uL   Hemoglobin 14.1 13.0 - 17.7 g/dL   Hematocrit 78.241.8 95.637.5 - 51.0 %   MCV 83 79 - 97 fL   MCH 27.9 26.6 - 33.0 pg   MCHC 33.7 31.5 - 35.7 g/dL   RDW 21.313.1 08.611.6 - 57.815.4 %   Platelets 200 150 - 450 x10E3/uL   Neutrophils 27 Not Estab. %   Lymphs 37 Not Estab. %   Monocytes 24 Not Estab. %   Eos 9 Not Estab. %   Basos 3 Not Estab. %  Neutrophils Absolute 1.5 1.4 - 7.0 x10E3/uL   Lymphocytes Absolute 1.9 0.7 - 3.1 x10E3/uL   Monocytes Absolute 1.3 (H) 0.1 - 0.9 x10E3/uL   EOS (ABSOLUTE) 0.5 (H) 0.0 - 0.4 x10E3/uL   Basophils Absolute 0.2 0.0 - 0.2 x10E3/uL   Immature Granulocytes 0 Not Estab. %   Immature Grans (Abs) 0.0 0.0 - 0.1 x10E3/uL   Hematology Comments: Note:   Comprehensive metabolic panel  Result Value Ref Range   Glucose 92 65 - 99 mg/dL   BUN 15 6 - 24 mg/dL   Creatinine, Ser 5.64 0.76 - 1.27 mg/dL   GFR calc non Af Amer 105 >59 mL/min/1.73   GFR calc Af Amer 122 >59 mL/min/1.73   BUN/Creatinine Ratio 19 9 - 20   Sodium 139 134 - 144 mmol/L   Potassium 4.2 3.5 - 5.2 mmol/L   Chloride 100 96 - 106 mmol/L   CO2 23 20 - 29 mmol/L   Calcium 9.7 8.7 - 10.2 mg/dL   Total Protein 7.2 6.0 - 8.5 g/dL   Albumin 4.5 4.0 - 5.0 g/dL   Globulin, Total 2.7 1.5 - 4.5 g/dL   Albumin/Globulin Ratio 1.7 1.2 - 2.2   Bilirubin Total 0.3 0.0 - 1.2 mg/dL   Alkaline Phosphatase 126 (H) 39 - 117 IU/L   AST 38 0 - 40 IU/L   ALT 46 (H) 0 - 44 IU/L  Lipid  Panel w/o Chol/HDL Ratio  Result Value Ref Range   Cholesterol, Total 125 100 - 199 mg/dL   Triglycerides 332 (H) 0 - 149 mg/dL   HDL 23 (L) >95 mg/dL   VLDL Cholesterol Cal 43 (H) 5 - 40 mg/dL   LDL Calculated 59 0 - 99 mg/dL      Assessment & Plan:   Problem List Items Addressed This Visit      Cardiovascular and Mediastinum   Essential hypertension - Primary    D/C HCTZ due to side effects. Increase metoprolol to 100 mg XL and monitor home readings closely. Patient aware to call with any dizziness or faintness as well as any abnormal readings. Recheck in 1 month      Relevant Medications   metoprolol succinate (TOPROL-XL) 100 MG 24 hr tablet       Follow up plan: Return in about 4 weeks (around 01/24/2019) for BP f/u.

## 2019-01-08 ENCOUNTER — Encounter: Payer: Self-pay | Admitting: Student in an Organized Health Care Education/Training Program

## 2019-01-08 ENCOUNTER — Telehealth: Payer: Self-pay | Admitting: *Deleted

## 2019-01-08 NOTE — Telephone Encounter (Signed)
Pre appointment call done.

## 2019-01-09 ENCOUNTER — Other Ambulatory Visit: Payer: Self-pay

## 2019-01-09 ENCOUNTER — Ambulatory Visit
Payer: Medicare Other | Attending: Student in an Organized Health Care Education/Training Program | Admitting: Student in an Organized Health Care Education/Training Program

## 2019-01-09 DIAGNOSIS — S88919A Complete traumatic amputation of unspecified lower leg, level unspecified, initial encounter: Secondary | ICD-10-CM | POA: Diagnosis not present

## 2019-01-09 DIAGNOSIS — G546 Phantom limb syndrome with pain: Secondary | ICD-10-CM

## 2019-01-09 DIAGNOSIS — M79604 Pain in right leg: Secondary | ICD-10-CM

## 2019-01-09 DIAGNOSIS — G8929 Other chronic pain: Secondary | ICD-10-CM

## 2019-01-09 DIAGNOSIS — G894 Chronic pain syndrome: Secondary | ICD-10-CM | POA: Diagnosis not present

## 2019-01-09 DIAGNOSIS — D700 Congenital agranulocytosis: Secondary | ICD-10-CM

## 2019-01-09 MED ORDER — HYDROCODONE-ACETAMINOPHEN 10-325 MG PO TABS: 1.0000 | ORAL_TABLET | Freq: Two times a day (BID) | ORAL | 0 refills | Status: DC | PRN

## 2019-01-09 MED ORDER — HYDROCODONE-ACETAMINOPHEN 10-325 MG PO TABS: 1.0000 | ORAL_TABLET | Freq: Two times a day (BID) | ORAL | 0 refills | Status: AC | PRN

## 2019-01-09 NOTE — Progress Notes (Signed)
Pain Management Virtual Encounter Note - Virtual Visit via Telephone Telehealth (real-time audio visits between healthcare provider and patient).   Patient's Phone No. & Preferred Pharmacy:  [phone] (home); [phone] (mobile); (Preferred) [phone] [id number] [address], [address] [place] [address] Phone: [phone] Fax: [phone]    Pre-screening note:  Our staff contacted [name] and offered him an "in person", "face-to-face" appointment versus a telephone encounter. He indicated preferring the telephone encounter, at this time.   Reason for Virtual Visit: COVID-19*  Social distancing based on CDC and AMA recommendations.   I contacted [name] on 01/09/2019 via telephone.      I clearly identified myself as Brandon Santa, MD. I verified that I was speaking with the correct person using two identifiers (Name: Brandon Myers, and date of birth: 11-21-1965).  Advanced Informed Consent I sought verbal advanced consent from [name] for virtual visit interactions. I informed Mr. Skeels of possible security and privacy concerns, risks, and limitations associated with providing "not-in-person" medical evaluation and management services. I also informed Mr. Barca of the availability of "in-person" appointments. Finally, I informed him that there would be a charge for the virtual visit and that he could be  personally, fully or partially, financially responsible for it. Mr. Galvan expressed understanding and agreed to proceed.   Historic Elements   Mr. GARLEN REINIG is a 53 y.o. year old, male patient evaluated today after his last encounter by our practice on 01/08/2019. 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 He also  has a past  surgical history that includes Amputation; Appendectomy; Colon surgery (1994); Above elbow arm amputation (Right, 1993); and Leg amputation through knee (Right, 1993). [name] has a current medication list which includes the following prescription(s): filgrastim-sndz, fluticasone, gabapentin, gabapentin, hydrocodone-acetaminophen, hydrocodone-acetaminophen, hydrocodone-acetaminophen, loratadine, losartan, melatonin, meloxicam, metoprolol succinate, nortriptyline, omeprazole, and ventolin hfa. He  reports that he has never smoked. He has never used smokeless tobacco. He reports that he does not drink alcohol or use drugs. Mr. Yeargan is allergic to enalapril; hydrochlorothiazide; prochlorperazine; sulfa antibiotics; benadryl [diphenhydramine]; and strawberry extract.   HPI  Today, he is being contacted for medication management.   No change in medical history. Pain at baseline. Patient did have UGI endoscopy. Has not been informed of his results but assumes everything was unremarkable.  Pharmacotherapy Assessment   12/13/2018  1   10/09/2018  Hydrocodone-Acetamin 10-325 MG  60.00 30 Bi Lat   [id number]   [facility] ([id number])   0  20.00 MME  Comm Ins   [place]    Monitoring: Pharmacotherapy: No side-effects or adverse reactions reported. [place] PMP: PDMP reviewed during this encounter.       Compliance: No problems identified. Effectiveness: Clinically acceptable. Plan: Refer to "POC".  Pertinent Labs   SAFETY SCREENING Profile No results found for: SARSCOV2NAA, COVIDSOURCE, STAPHAUREUS, MRSAPCR, HCVAB, HIV, PREGTESTUR Renal Function Lab Results  Component Value Date   BUN 15 11/27/2018   CREATININE 0.78 11/27/2018   BCR 19 11/27/2018   GFRAA 122 11/27/2018   GFRNONAA 105 11/27/2018   Hepatic Function Lab Results  Component Value Date   AST 38 11/27/2018   ALT 46 (H) 11/27/2018   ALBUMIN 4.5 11/27/2018   UDS Summary  Date Value Ref Range  Status  01/18/2018 FINAL  Final    Comment:     ==================================================================== TOXASSURE COMP DRUG ANALYSIS,UR ==================================================================== Test                             Result       Flag       Units Drug Present   Hydrocodone                    1782                    ng/mg creat   Dihydrocodeine                 90                      ng/mg creat   Norhydrocodone                 1107                    ng/mg creat    Sources of hydrocodone include scheduled prescription    medications. Dihydrocodeine and norhydrocodone are expected    metabolites of hydrocodone. Dihydrocodeine is also available as a    scheduled prescription medication.   N-Desmethyltramadol            2557                    ng/mg creat    N-desmethyltramadol is an expected metabolite of tramadol. Source    of tramadol is a prescription medication.   Gabapentin                     PRESENT   Nortriptyline                  PRESENT    Nortriptyline may be administered as a prescription drug; it is    also an expected metabolite of amitriptyline.   Acetaminophen                  PRESENT   Metoprolol                     PRESENT ==================================================================== Test                      Result    Flag   Units      Ref Range   Creatinine              68               mg/dL      >=82>=20 ==================================================================== Declared Medications:  Medication list was not provided. ==================================================================== For clinical consultation, please call [phone]. ====================================================================    Note: Above Lab results reviewed.  Recent imaging  DG Neck Soft Tissue CLINICAL DATA:  Esophageal foreign body. Foreign body sensation after eating chicken. Difficulty swallowing.  EXAM: NECK SOFT TISSUES - 1+ VIEW  COMPARISON:   None.  FINDINGS: There is no evidence of retropharyngeal soft tissue swelling or epiglottic enlargement. The cervical airway is unremarkable. No subcutaneous emphysema. Soft tissues are non suspicious. No radio-opaque foreign body identified. Patient is edentulous.  IMPRESSION: Negative soft tissue neck radiographs. No radiopaque foreign body in the neck.  Electronically Signed   By: Narda RutherfordMelanie  Sanford M.D.   On: 10/27/2018 00:37  Assessment  The primary encounter diagnosis was Chronic pain syndrome. Diagnoses of 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(HCC), Phantom limb pain (HCC), Congenital neutropenia (HCC), and Chronic pain of right lower extremity were also pertinent to this visit.  Plan of Care  I am having Caryn BeeKevin L. Cope start on HYDROcodone-acetaminophen and HYDROcodone-acetaminophen. I am also having him maintain his filgrastim-sndz, Ventolin HFA, Melatonin, gabapentin, omeprazole, nortriptyline, meloxicam, losartan, loratadine, fluticasone, gabapentin, metoprolol succinate, and HYDROcodone-acetaminophen.  Pharmacotherapy (Medications Ordered): Meds ordered this encounter  Medications  . HYDROcodone-acetaminophen (NORCO) 10-325 MG tablet    Sig: Take 1 tablet by mouth every 12 (twelve) hours as needed for up to 30 days for severe pain. Must last 30 days.    Dispense:  60 tablet    Refill:  0    [place] STOP ACT - Not applicable. Fill one day early if pharmacy is closed on scheduled refill date.  Marland Kitchen. HYDROcodone-acetaminophen (NORCO) 10-325 MG tablet    Sig: Take 1 tablet by mouth every 12 (twelve) hours as needed for up to 30 days for severe pain. Must last 30 days.    Dispense:  60 tablet    Refill:  0    [place] STOP ACT - Not applicable. Fill one day early if pharmacy is closed on scheduled refill date.  Marland Kitchen. HYDROcodone-acetaminophen (NORCO) 10-325 MG tablet    Sig: Take 1 tablet by mouth every 12 (twelve) hours as needed for up to 30 days for severe pain. Must last 30 days.    Dispense:  60 tablet     Refill:  0    [place] STOP ACT - Not applicable. Fill one day early if pharmacy is closed on scheduled refill date.   Continue multimodal analgesics including gabapentin, meloxicam, nortriptyline as prescribed by PCP.  Orders:  No orders of the defined types were placed in this encounter.  Follow-up plan:   Return in about 3 months (around 04/11/2019) for Medication Management, in person.    I discussed the assessment and treatment plan with the patient. The patient was provided an opportunity to ask questions and all were answered. The patient agreed with the plan and demonstrated an understanding of the instructions.  Patient advised to call back or seek an in-person evaluation if the symptoms or condition worsens.  Total duration of non-face-to-face encounter: 25 minutes.  Note by: Edward JollyBilal Maragret Vanacker, MD Date: 01/09/2019; Time: 3:02 PM  Note: This dictation was prepared with Dragon dictation. Any transcriptional errors that may result from this process are unintentional.  Disclaimer:  * Given the special circumstances of the COVID-19 pandemic, the federal government has announced that the Office for Civil Rights (OCR) will exercise its enforcement discretion and will not impose penalties on physicians using telehealth in the event of noncompliance with regulatory requirements under the [employer] Insurance Portability and Accountability Act (HIPAA) in connection with the good faith provision of telehealth during the COVID-19 national public health emergency. (AMA)

## 2019-01-21 ENCOUNTER — Other Ambulatory Visit: Payer: Self-pay | Admitting: Family Medicine

## 2019-02-19 ENCOUNTER — Other Ambulatory Visit: Payer: Self-pay | Admitting: Family Medicine

## 2019-02-19 DIAGNOSIS — G546 Phantom limb syndrome with pain: Secondary | ICD-10-CM

## 2019-02-19 NOTE — Telephone Encounter (Signed)
Requested Prescriptions  Pending Prescriptions Disp Refills  . gabapentin (NEURONTIN) 300 MG capsule [Pharmacy Med Name: GABAPENTIN 300MG  CAPSULES] 90 capsule 0    Sig: TAKE ONE CAPSULE BY MOUTH DAILY( MID-DAY)     Neurology: Anticonvulsants - gabapentin Passed - 02/19/2019  1:56 PM      Passed - Valid encounter within last 12 months    Recent Outpatient Visits          1 month ago Essential hypertension   [facility] [place], [place]   2 months ago Essential hypertension   [address], [place], [place]   5 months ago Acute maxillary sinusitis, recurrence not specified   [place], [place], [place]   9 months ago Essential hypertension   [facility], [place]   10 months ago Essential hypertension   [facility] [place], [place]

## 2019-03-11 ENCOUNTER — Ambulatory Visit: Payer: Medicare Other

## 2019-04-11 ENCOUNTER — Other Ambulatory Visit: Payer: Self-pay

## 2019-04-11 ENCOUNTER — Ambulatory Visit
Payer: Medicare Other | Attending: Student in an Organized Health Care Education/Training Program | Admitting: Student in an Organized Health Care Education/Training Program

## 2019-04-11 ENCOUNTER — Encounter: Payer: Self-pay | Admitting: Student in an Organized Health Care Education/Training Program

## 2019-04-11 VITALS — BP 130/93 | HR 86 | Temp 98.7°F | Resp 16 | Ht 68.0 in | Wt 184.0 lb

## 2019-04-11 DIAGNOSIS — G8929 Other chronic pain: Secondary | ICD-10-CM

## 2019-04-11 DIAGNOSIS — M79604 Pain in right leg: Secondary | ICD-10-CM

## 2019-04-11 DIAGNOSIS — S88919A Complete traumatic amputation of unspecified lower leg, level unspecified, initial encounter: Secondary | ICD-10-CM | POA: Diagnosis not present

## 2019-04-11 DIAGNOSIS — S48911S Complete traumatic amputation of right shoulder and upper arm, level unspecified, sequela: Secondary | ICD-10-CM

## 2019-04-11 DIAGNOSIS — G546 Phantom limb syndrome with pain: Secondary | ICD-10-CM

## 2019-04-11 DIAGNOSIS — D700 Congenital agranulocytosis: Secondary | ICD-10-CM | POA: Diagnosis not present

## 2019-04-11 DIAGNOSIS — G894 Chronic pain syndrome: Secondary | ICD-10-CM | POA: Diagnosis present

## 2019-04-11 MED ORDER — HYDROCODONE-ACETAMINOPHEN 10-325 MG PO TABS: 1.0000 | ORAL_TABLET | Freq: Two times a day (BID) | ORAL | 0 refills | Status: AC | PRN

## 2019-04-11 MED ORDER — HYDROCODONE-ACETAMINOPHEN 10-325 MG PO TABS: 1.0000 | ORAL_TABLET | Freq: Two times a day (BID) | ORAL | 0 refills | Status: DC | PRN

## 2019-04-11 NOTE — Progress Notes (Signed)
Patient's Name: Brandon Myers  MRN: [id number]  Referring Provider: Volney American,*  DOB: Jul 23, 1966  PCP: Volney American, PA-C  DOS: 04/11/2019  Note by: [name], MD  Service setting: Ambulatory outpatient  Attending: [name], MD  Location: [facility] (AMB) Pain Management Facility  Specialty: Interventional Pain Management  Patient type: Established   Primary Reason(s) for Visit: Encounter for prescription drug management. (Level of risk: moderate)  CC: Leg Pain (right, lower)  HPI  Brandon Myers is a 53 y.o. year old, male patient, who comes today for a medication management evaluation. 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 His primarily concern today is the Leg Pain (right, lower)  Pain Assessment: Location: Right, Lower Leg Radiating: denies Onset: More than a month ago Duration: Chronic pain, Other (Comment)(phantom pain) Quality: Tingling Severity:5  /10 (subjective, self-reported pain score)  Note: Reported level is compatible with observation.                         When using our objective Pain Scale, levels between 6 and 10/10 are said to belong in an emergency room, as it progressively worsens from a 6/10, described as severely limiting, requiring emergency care not usually available at an outpatient pain management facility. At a 6/10 level, communication becomes difficult and requires great effort. Assistance to reach the emergency department may be required. Facial flushing and profuse sweating along with potentially dangerous increases in heart rate and blood pressure will be evident. Effect on ADL:    Timing: Constant Modifying factors: medications BP: (!) 130/93  HR: 86  Brandon Myers was last scheduled for an appointment on 01/09/2019 for medication management. During today's appointment we reviewed Brandon Myers's chronic pain status, as well as his outpatient medication regimen.  Overall, no significant changes in medical history.  Utilizes hydrocodone as prescribed.  He states that on some days especially with the weather changing now, he does require a dose in the afternoon to help manage his pain.  Otherwise the medications help him function, help facilitate performance of his ADLs and help improve functional status.  Will increase monthly quantity from 60-75 so that patient can utilize extra dose on extremely painful days.  The patient  reports no history of drug use. His body mass index is 27.98 kg/m.  Further details on both, my assessment(s), as well as the proposed treatment plan, please see below.  Controlled Substance Pharmacotherapy Assessment REMS (Risk Evaluation and Mitigation Strategy)  Analgesic: 03/13/2019  1   01/09/2019  Hydrocodone-Acetamin 10-325 MG  60.00  30 Bi Lat   [id number]   Wal (5798)   0  20.00 MME  Comm Ins   [place]    Landis Martins, RN  04/11/2019  1:57 PM  Sign when Signing Visit Nursing Pain Medication Assessment:  Safety precautions to be maintained throughout the outpatient stay will include: orient to surroundings, keep bed in low position, maintain call bell within reach at all times, provide assistance with transfer out of bed and ambulation.  Medication Inspection Compliance: Pill count conducted under aseptic conditions, in front of the patient. Neither the pills nor the bottle was removed from the patient's sight at any time. Once count  was completed pills were immediately returned to the patient in their original bottle.  Medication: Hydrocodone/APAP Pill/Patch Count: 3 of 60 pills remain Pill/Patch Appearance: Markings consistent with prescribed  medication Bottle Appearance: Standard pharmacy container. Clearly labeled. Filled Date: 08/10 / 2020 Last Medication intake:  Yesterday   Pharmacokinetics: Liberation and absorption (onset of action): WNL Distribution (time to peak effect): WNL Metabolism and excretion (duration of action): WNL         Pharmacodynamics: Desired effects: Analgesia: Brandon Myers reports >50% benefit. Functional ability: Patient reports that medication allows him to accomplish basic ADLs Clinically meaningful improvement in function (CMIF): Sustained CMIF goals met Perceived effectiveness: Described as relatively effective, allowing for increase in activities of daily living (ADL) Undesirable effects: Side-effects or Adverse reactions: None reported Monitoring:  PMP: PDMP reviewed during this encounter. Online review of the past 37-monthperiod conducted. Compliant with practice rules and regulations Last UDS on record: Summary  Date Value Ref Range Status  01/18/2018 FINAL  Final    Comment:    ==================================================================== TOXASSURE COMP DRUG ANALYSIS,UR ==================================================================== Test                             Result       Flag       Units Drug Present   Hydrocodone                    1782                    ng/mg creat   Dihydrocodeine                 90                      ng/mg creat   Norhydrocodone                 1107                    ng/mg creat    Sources of hydrocodone include scheduled prescription    medications. Dihydrocodeine and norhydrocodone are expected    metabolites of hydrocodone. Dihydrocodeine is also available as a    scheduled prescription medication.   N-Desmethyltramadol            2557                    ng/mg creat    N-desmethyltramadol is an expected metabolite of tramadol. Source    of tramadol is a prescription medication.   Gabapentin                     PRESENT    Nortriptyline                  PRESENT    Nortriptyline may be administered as a prescription drug; it is    also an expected metabolite of amitriptyline.   Acetaminophen                  PRESENT   Metoprolol                     PRESENT ==================================================================== Test                      Result    Flag   Units      Ref  Range   Creatinine              68               mg/dL      >=20 ==================================================================== Declared Medications:  Medication list was not provided. ==================================================================== For clinical consultation, please call [phone]. ====================================================================    UDS interpretation: Compliant          Medication Assessment Form: Reviewed. Patient indicates being compliant with therapy Treatment compliance: Compliant Risk Assessment Profile: Aberrant behavior: See initial evaluations. None observed or detected today Comorbid factors increasing risk of overdose: See initial evaluation. No additional risks detected today Opioid risk tool (ORT):  Opioid Risk  07/10/2018  Alcohol 0  Illegal Drugs 0  Rx Drugs 0  Alcohol 0  Illegal Drugs 0  Rx Drugs 0  Age between 16-45 years  0  History of Preadolescent Sexual Abuse -  Psychological Disease 0  Depression 0  Opioid Risk Tool Scoring 0  Opioid Risk Interpretation Low Risk    ORT Scoring interpretation table:  Score <3 = Low Risk for SUD  Score between 4-7 = Moderate Risk for SUD  Score >8 = High Risk for Opioid Abuse   Risk of substance use disorder (SUD): Low  Risk Mitigation Strategies:  Patient Counseling: Covered Patient-Prescriber Agreement (PPA): Present and active  Notification to other healthcare providers: Done  Pharmacologic Plan: Increase monthly quantity from 60 to 75/month.             Laboratory Chemistry Profile    Renal Lab  Results  Component Value Date   BUN 15 11/27/2018   CREATININE 0.78 11/27/2018   BCR 19 11/27/2018   GFRAA 122 11/27/2018   GFRNONAA 105 11/27/2018                             Hepatic Lab Results  Component Value Date   AST 38 11/27/2018   ALT 46 (H) 11/27/2018   ALBUMIN 4.5 11/27/2018   ALKPHOS 126 (H) 11/27/2018                        Electrolytes Lab Results  Component Value Date   NA 139 11/27/2018   K 4.2 11/27/2018   CL 100 11/27/2018   CALCIUM 9.7 11/27/2018                        Neuropathy Lab Results  Component Value Date   HGBA1C 5.1 12/09/2016                        Coagulation Lab Results  Component Value Date   PLT 200 11/27/2018   LABHEMA Note: 11/27/2018                        Cardiovascular Lab Results  Component Value Date   HGB 14.1 11/27/2018   HCT 41.8 11/27/2018                         Endocrine Lab Results  Component Value Date   TSH 3.580 01/09/2018                        Note: Lab results reviewed.  Recent Diagnostic Imaging Results  DG Neck Soft Tissue CLINICAL DATA:  Esophageal foreign  body. Foreign body sensation after eating chicken. Difficulty swallowing.  EXAM: NECK SOFT TISSUES - 1+ VIEW  COMPARISON:  None.  FINDINGS: There is no evidence of retropharyngeal soft tissue swelling or epiglottic enlargement. The cervical airway is unremarkable. No subcutaneous emphysema. Soft tissues are non suspicious. No radio-opaque foreign body identified. Patient is edentulous.  IMPRESSION: Negative soft tissue neck radiographs. No radiopaque foreign body in the neck.  Electronically Signed   By: Keith Rake M.D.   On: 10/27/2018 00:37  Complexity Note: Imaging results reviewed. Results shared with Mr. Jacobsen, using Layman's terms.                               Meds   Current Outpatient Medications:  .  Filgrastim-sndz (ZARXIO) 480 MCG/0.8ML SOSY, Inject 0.8 mLs into the muscle every other day., Disp: , Rfl:  .   fluticasone (FLONASE) 50 MCG/ACT nasal spray, INSTILL 1 TO 2 SPRAYS IN EACH NOSTRIL EVERY NIGHT AT BEDTIME x 2-3 weeks, Disp: 16 g, Rfl: 12 .  gabapentin (NEURONTIN) 300 MG capsule, TAKE ONE CAPSULE BY MOUTH DAILY( MID-DAY), Disp: 90 capsule, Rfl: 0 .  gabapentin (NEURONTIN) 800 MG tablet, Take 1 tablet (800 mg total) by mouth 3 (three) times daily., Disp: 270 tablet, Rfl: 3 .  loratadine (CLARITIN) 10 MG tablet, Take 1 tablet (10 mg total) by mouth daily., Disp: 90 tablet, Rfl: 3 .  losartan (COZAAR) 100 MG tablet, TAKE 1 TABLET(100 MG) BY MOUTH DAILY, Disp: 90 tablet, Rfl: 1 .  Melatonin 10 MG TABS, Take 10 mg by mouth at bedtime., Disp: , Rfl:  .  meloxicam (MOBIC) 7.5 MG tablet, Take 1 tablet (7.5 mg total) by mouth daily., Disp: 90 tablet, Rfl: 1 .  metoprolol succinate (TOPROL-XL) 100 MG 24 hr tablet, TAKE 1 TABLET BY MOUTH EVERY DAY WITH OR IMMEDIATELY FOLLOWING A MEAL, due for follow up, Disp: 90 tablet, Rfl: 0 .  nortriptyline (PAMELOR) 10 MG capsule, Take 1 capsule (10 mg total) by mouth at bedtime., Disp: 90 capsule, Rfl: 1 .  omeprazole (PRILOSEC) 40 MG capsule, Take 1 capsule (40 mg total) by mouth daily., Disp: 90 capsule, Rfl: 3 .  VENTOLIN HFA 108 (90 Base) MCG/ACT inhaler, INHALE 2 PUFFS BY MOUTH FOUR TIMES DAILY AS NEEDED, Disp: 18 g, Rfl: 12 .  [START ON 04/12/2019] HYDROcodone-acetaminophen (NORCO) 10-325 MG tablet, Take 1-2 tablets by mouth every 12 (twelve) hours as needed for severe pain. Must last 30 days., Disp: 75 tablet, Rfl: 0 .  [START ON 05/12/2019] HYDROcodone-acetaminophen (NORCO) 10-325 MG tablet, Take 1-2 tablets by mouth every 12 (twelve) hours as needed for severe pain. Must last 30 days., Disp: 75 tablet, Rfl: 0 .  [START ON 06/11/2019] HYDROcodone-acetaminophen (NORCO) 10-325 MG tablet, Take 1-2 tablets by mouth every 12 (twelve) hours as needed for severe pain. Must last 30 days., Disp: 75 tablet, Rfl: 0  ROS  Constitutional: Denies any fever or  chills Gastrointestinal: No reported hemesis, hematochezia, vomiting, or acute GI distress Musculoskeletal: Denies any acute onset joint swelling, redness, loss of ROM, or weakness Neurological: No reported episodes of acute onset apraxia, aphasia, dysarthria, agnosia, amnesia, paralysis, loss of coordination, or loss of consciousness  Allergies  Mr. Valiente is allergic to enalapril; hydrochlorothiazide; prochlorperazine; sulfa antibiotics; benadryl [diphenhydramine]; and strawberry extract.  PFSH  Drug: Mr. Ringler  reports no history of drug use. Alcohol:  reports no history of alcohol use. Tobacco:  reports that  he has never smoked. He has never used smokeless tobacco. Medical:  has a past medical history of Allergy, Controlled substance agreement signed (04/02/2017), GERD (gastroesophageal reflux disease), Hypertension, and Neutropenia, congenital ([place]). Surgical: Mr. Privitera  has a past surgical history that includes Amputation; Appendectomy; Colon surgery (1994); Above elbow arm amputation (Right, 1993); and Leg amputation through knee (Right, 1993). Family: family history includes Alzheimer's disease in his mother; Asthma in his mother; Cancer in his father, paternal grandfather, and paternal grandmother; Depression in his mother; Heart Problems in his mother; Stroke in his maternal grandmother; Thyroid disease in his mother.  Constitutional Exam  General appearance: Well nourished, well developed, and well hydrated. In no apparent acute distress Vitals:   04/11/19 1349 04/11/19 1357  BP: (!) 154/107 (!) 130/93  Pulse: 86   Resp: 16   Temp: 98.7 F (37.1 C)   TempSrc: Oral   SpO2: 98%   Weight: 184 lb (83.5 kg)   Height: '5\' 8"'$  (1.727 m)    BMI Assessment: Estimated body mass index is 27.98 kg/m as calculated from the following:   Height as of this encounter: '5\' 8"'$  (1.727 m).   Weight as of this encounter: 184 lb (83.5 kg).  BMI interpretation table: BMI level Category Range  association with higher incidence of chronic pain  <18 kg/m2 Underweight   18.5-24.9 kg/m2 Ideal body weight   25-29.9 kg/m2 Overweight Increased incidence by 20%  30-34.9 kg/m2 Obese (Class I) Increased incidence by 68%  35-39.9 kg/m2 Severe obesity (Class II) Increased incidence by 136%  >40 kg/m2 Extreme obesity (Class III) Increased incidence by 254%   Patient's current BMI Ideal Body weight  Body mass index is 27.98 kg/m. Ideal body weight: 68.4 kg (150 lb 12.7 oz) Adjusted ideal body weight: 74.4 kg (164 lb 1.2 oz)   BMI Readings from Last 4 Encounters:  04/11/19 27.98 kg/m  12/27/18 29.04 kg/m  11/23/18 28.13 kg/m  10/26/18 28.13 kg/m   Wt Readings from Last 4 Encounters:  04/11/19 184 lb (83.5 kg)  12/27/18 191 lb (86.6 kg)  11/23/18 185 lb (83.9 kg)  10/26/18 185 lb (83.9 kg)  Psych/Mental status: Alert, oriented x 3 (person, place, & time)       Eyes: PERLA Respiratory: No evidence of acute respiratory distress   Cervical Spine Area Exam  Skin & Axial Inspection: No masses, redness, edema, swelling, or associated skin lesions Alignment: Symmetrical Functional ROM: Unrestricted ROM      Stability: No instability detected Muscle Tone/Strength: Functionally intact. No obvious neuro-muscular anomalies detected. Sensory (Neurological): Unimpaired Palpation: No palpable anomalies                    Upper Extremity (UE) Exam    Side: Right upper extremity  Side: Left upper extremity   Skin & Extremity Inspection: Above elbow amputation (AEA)  Skin & Extremity Inspection: Skin color, temperature, and hair growth are WNL. No peripheral edema or cyanosis. No masses, redness, swelling, asymmetry, or associated skin lesions. No contractures.   Functional ROM: Decreased ROM          Functional ROM: Unrestricted ROM           Muscle Tone/Strength: Deconditioned  Muscle Tone/Strength: Functionally intact. No obvious neuro-muscular anomalies detected.   Sensory  (Neurological): Neuropathic pain pattern          Sensory (Neurological): Unimpaired           Palpation: No palpable anomalies  Palpation: No palpable anomalies               Provocative Test(s):  Phalen's test: deferred Tinel's test: deferred Apley's scratch test (touch opposite shoulder):  Action 1 (Across chest): deferred Action 2 (Overhead): deferred Action 3 (LB reach): deferred   Provocative Test(s):  Phalen's test: deferred Tinel's test: deferred Apley's scratch test (touch opposite shoulder):  Action 1 (Across chest): deferred Action 2 (Overhead): deferred Action 3 (LB reach): deferred     Thoracic Spine Area Exam  Skin & Axial Inspection: No masses, redness, or swelling Alignment: Symmetrical Functional ROM: Unrestricted ROM Stability: No instability detected Muscle Tone/Strength: Functionally intact. No obvious neuro-muscular anomalies detected. Sensory (Neurological): Unimpaired Muscle strength & Tone: No palpable anomalies  Lumbar Spine Area Exam  Skin & Axial Inspection: No masses, redness, or swelling Alignment: Symmetrical Functional ROM: Unrestricted ROM       Stability: No instability detected Muscle Tone/Strength: Functionally intact. No obvious neuro-muscular anomalies detected. Sensory (Neurological): Unimpaired Palpation: No palpable anomalies       Provocative Tests: Lumbar Hyperextension/rotation test: deferred today       Lumbar quadrant test (Kemp's test): deferred today       Lumbar Lateral bending test: deferred today       Patrick's Maneuver: deferred today                   FABER test: deferred today       Thigh-thrust test: deferred today       S-I compression test: deferred today       S-I distraction test: deferred today        Gait & Posture Assessment  Ambulation: Limited Gait: Antalgic gait (limping) Posture: Difficulty with positional changes   Lower Extremity Exam    Side: Right lower extremity  Side:  Left lower extremity  Stability: No instability observed          Stability: No instability observed          Skin & Extremity Inspection: Above knee amputation (AKA)  Skin & Extremity Inspection: Skin color, temperature, and hair growth are WNL. No peripheral edema or cyanosis. No masses, redness, swelling, asymmetry, or associated skin lesions. No contractures.  Functional ROM: Decreased ROM                  Functional ROM: Unrestricted ROM                  Muscle Tone/Strength: Functionally intact. No obvious neuro-muscular anomalies detected.  Muscle Tone/Strength: Functionally intact. No obvious neuro-muscular anomalies detected.  Sensory (Neurological): Neuropathic pain pattern  Sensory (Neurological): Unimpaired  Palpation: No palpable anomalies  Palpation: No palpable anomalies    Assessment   Status Diagnosis  Controlled Controlled Controlled 1. Chronic pain syndrome   2. 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 Phantom limb pain ([place])   4. Congenital neutropenia ([facility])   5. Chronic pain of right lower extremity   6. Amputation of right upper extremity, sequela ([place])      General Recommendations: The pain condition that the patient suffers from is best treated with a multidisciplinary approach that involves an increase in physical activity to prevent de-conditioning and worsening of the pain cycle, as well as psychological counseling (formal and/or informal) to address the co-morbid psychological affects of pain. Treatment will often involve judicious use of pain medications and interventional procedures to decrease the pain, allowing the patient to participate in the physical activity that will ultimately  produce long-lasting pain reductions. The goal of the multidisciplinary approach is to return the patient to a higher level of overall function and to restore their ability to perform activities of daily living.  Continue multimodal pain regimen with gabapentin 800 mg 3 times daily,  Mobic 7.5 mg daily, nortriptyline 10 mg nightly.  Annual UDS as below for medication compliance and monitoring.  Refill of hydrocodone as below with increase in monthly quantity from 60-->75 to optimize pain control specifically for severe breakthrough pain on extremely painful days.. Plan of Care  Pharmacotherapy (Medications Ordered): Meds ordered this encounter  Medications  . HYDROcodone-acetaminophen (NORCO) 10-325 MG tablet    Sig: Take 1-2 tablets by mouth every 12 (twelve) hours as needed for severe pain. Must last 30 days.    Dispense:  75 tablet    Refill:  0    [place] STOP ACT - Not applicable. Fill one day early if pharmacy is closed on scheduled refill date.  Marland Kitchen HYDROcodone-acetaminophen (NORCO) 10-325 MG tablet    Sig: Take 1-2 tablets by mouth every 12 (twelve) hours as needed for severe pain. Must last 30 days.    Dispense:  75 tablet    Refill:  0    [place] STOP ACT - Not applicable. Fill one day early if pharmacy is closed on scheduled refill date.  Marland Kitchen HYDROcodone-acetaminophen (NORCO) 10-325 MG tablet    Sig: Take 1-2 tablets by mouth every 12 (twelve) hours as needed for severe pain. Must last 30 days.    Dispense:  75 tablet    Refill:  0    [place] STOP ACT - Not applicable. Fill one day early if pharmacy is closed on scheduled refill date.   Medications administered today: [name] L. Edgley had no medications administered during this visit.  Orders:  Orders Placed This Encounter  Procedures  . ToxASSURE Select 13 (MW), Urine    Volume: 30 ml(s). Minimum 3 ml of urine is needed. Document temperature of fresh sample. Indications: Long term (current) use of opiate analgesic (Z79.891)    Lab Orders     ToxASSURE Select 13 (MW), Urine  Planned follow-up:   Return in about 3 months (around 07/11/2019) for Medication Management, virtual.   Recent Visits No visits were found meeting these conditions.  Showing recent visits within past 90 days and meeting all other requirements    Today's Visits Date Type Provider Dept  04/11/19 Office Visit [name], MD [facility]  Showing today's visits and meeting all other requirements   Future Appointments Date Type Provider Dept  07/02/19 Appointment [name], MD [facility]  Showing future appointments within next 90 days and meeting all other requirements   Primary Care Physician: Volney American, PA-C Location: [facility] Note by: [name], MD Date: 04/11/2019; Time: 2:13 PM  Note: This dictation was prepared with Dragon dictation. Any transcriptional errors that may result from this process are unintentional.

## 2019-04-11 NOTE — Progress Notes (Signed)
Nursing Pain Medication Assessment:  Safety precautions to be maintained throughout the outpatient stay will include: orient to surroundings, keep bed in low position, maintain call bell within reach at all times, provide assistance with transfer out of bed and ambulation.  Medication Inspection Compliance: Pill count conducted under aseptic conditions, in front of the patient. Neither the pills nor the bottle was removed from the patient's sight at any time. Once count was completed pills were immediately returned to the patient in their original bottle.  Medication: Hydrocodone/APAP Pill/Patch Count: 3 of 60 pills remain Pill/Patch Appearance: Markings consistent with prescribed medication Bottle Appearance: Standard pharmacy container. Clearly labeled. Filled Date: 08/10 / 2020 Last Medication intake:  Yesterday

## 2019-04-13 LAB — TOXASSURE SELECT 13 (MW), URINE

## 2019-05-04 ENCOUNTER — Other Ambulatory Visit: Payer: Self-pay | Admitting: Family Medicine

## 2019-05-04 DIAGNOSIS — G546 Phantom limb syndrome with pain: Secondary | ICD-10-CM

## 2019-05-04 NOTE — Telephone Encounter (Signed)
Requested Prescriptions  Pending Prescriptions Disp Refills  . gabapentin (NEURONTIN) 300 MG capsule [Pharmacy Med Name: GABAPENTIN 300MG  CAPSULES] 90 capsule 0    Sig: TAKE ONE CAPSULE BY MOUTH DAILY AT MIDDAY     Neurology: Anticonvulsants - gabapentin Passed - 05/04/2019  1:15 PM      Passed - Valid encounter within last 12 months    Recent Outpatient Visits          4 months ago Essential hypertension   [facility] [place], [place]   5 months ago Essential hypertension   [place], [place], [place]   7 months ago Acute maxillary sinusitis, recurrence not specified   [facility] Mer[place], [place]   11 months ago Essential hypertension   [facility] Vol[place], [place]   1 year ago Essential hypertension   [place], [place], [place]      Future Appointments            In 3 weeks O[place] Desanctis, Lilia Argue, PA-C [place], [facility]           . nortriptyline (PAMELOR) 10 MG capsule [Pharmacy Med Name: NORTRIPTYLINE 10MG  CAPSULES] 90 capsule 0    Sig: TAKE 1 CAPSULE(10 MG) BY MOUTH AT BEDTIME     Psychiatry:  Antidepressants - Heterocyclics (TCAs) Failed - 05/04/2019  1:15 PM      Failed - Completed PHQ-2 or PHQ-9 in the last 360 days.      Passed - Valid encounter within last 6 months    Recent Outpatient Visits          4 months ago Essential hypertension   [place], [place], [place]   5 months ago Essential hypertension   [place], [place], [place]   7 months ago Acute maxillary sinusitis, recurrence not specified   [facility] [place], [place]   11 months ago Essential hypertension   [facility] Mer[place] Roof [place], [place]   1 year ago Essential hypertension   [facility] [place], [place]      Future Appointments        In 3 weeks Orene Desanctis, Lilia Argue, PA-C [facility], [facility]           called pt.; 6 mo. F/u appt. Scheduled for 05/27/19

## 2019-05-04 NOTE — Telephone Encounter (Signed)
Requested medication (s) are due for refill today: yes  Requested medication (s) are on the active medication list: yes  Last refill:  04/07/2019  Future visit scheduled: no  Notes to clinic:  Review for refill   Requested Prescriptions  Pending Prescriptions Disp Refills   metoprolol succinate (TOPROL-XL) 100 MG 24 hr tablet [Pharmacy Med Name: METOPROLOL ER SUCCINATE 100MG  TABS] 30 tablet     Sig: TAKE 1 TABLET BY MOUTH EVERY DAY WITH OR IMMEDIATELY FOLLOWING A MEAL     Cardiovascular:  Beta Blockers Failed - 05/04/2019 12:28 PM      Failed - Last BP in normal range    BP Readings from Last 1 Encounters:  04/11/19 (!) 130/93         Passed - Last Heart Rate in normal range    Pulse Readings from Last 1 Encounters:  04/11/19 86         Passed - Valid encounter within last 6 months    Recent Outpatient Visits          4 months ago Essential hypertension   [facility] [place], [place]   5 months ago Essential hypertension   [place], [place], [place]   7 months ago Acute maxillary sinusitis, recurrence not specified   [place], [place], [place]   11 months ago Essential hypertension   [place], Lilia Argue, [place]   1 year ago Essential hypertension   [place], [place], [place]

## 2019-05-27 ENCOUNTER — Ambulatory Visit: Payer: Self-pay | Admitting: Family Medicine

## 2019-05-30 ENCOUNTER — Other Ambulatory Visit: Payer: Self-pay

## 2019-05-30 ENCOUNTER — Encounter: Payer: Self-pay | Admitting: Family Medicine

## 2019-05-30 ENCOUNTER — Ambulatory Visit (INDEPENDENT_AMBULATORY_CARE_PROVIDER_SITE_OTHER): Payer: Medicare Other | Admitting: Family Medicine

## 2019-05-30 VITALS — BP 130/82 | HR 79 | Temp 98.5°F | Ht 68.0 in | Wt 199.0 lb

## 2019-05-30 DIAGNOSIS — E782 Mixed hyperlipidemia: Secondary | ICD-10-CM | POA: Diagnosis not present

## 2019-05-30 DIAGNOSIS — K219 Gastro-esophageal reflux disease without esophagitis: Secondary | ICD-10-CM | POA: Diagnosis not present

## 2019-05-30 DIAGNOSIS — I10 Essential (primary) hypertension: Secondary | ICD-10-CM

## 2019-05-30 DIAGNOSIS — G546 Phantom limb syndrome with pain: Secondary | ICD-10-CM

## 2019-05-30 DIAGNOSIS — Z23 Encounter for immunization: Secondary | ICD-10-CM

## 2019-05-30 NOTE — Progress Notes (Signed)
BP 130/82   Pulse 79   Temp 98.5 F (36.9 C) (Oral)   Ht 5\' 8"  (1.727 m)   Wt 199 lb (90.3 kg)   SpO2 95%   BMI 30.26 kg/m    Subjective:    Patient ID: , male    DOB: 1965-10-20, 53 y.o.   MRN: [id number]  HPI: Brandon Myers is a 53 y.o. male  Chief Complaint  Patient presents with  . Hypertension  . Hyperlipidemia   Here today for 6 month f/u.   HTN - home BPs when checked have been 120s-130s/80s consistently. Taking his medicine faithfully without side effects. Denies CP, SOB, HAs,dizziness.  HLD - diet controlled currently. Has not been consistent with diet or exercise during pandemic. Denies claudication, CP, SOB.   Phantom limb pain - followed by pan management, on hydrocodone, mobic, gabapentin, nortriptyline regimen which seems to work well.   GERD - prilosec daily keeps sxs under good control.   Relevant past medical, surgical, family and social history reviewed and updated as indicated. Interim medical history since our last visit reviewed. Allergies and medications reviewed and updated.  Review of Systems  Per HPI unless specifically indicated above     Objective:    BP 130/82   Pulse 79   Temp 98.5 F (36.9 C) (Oral)   Ht 5\' 8"  (1.727 m)   Wt 199 lb (90.3 kg)   SpO2 95%   BMI 30.26 kg/m   Wt Readings from Last 3 Encounters:  05/30/19 199 lb (90.3 kg)  04/11/19 184 lb (83.5 kg)  12/27/18 191 lb (86.6 kg)    Physical Exam Vitals signs and nursing note reviewed.  Constitutional:      Appearance: Normal appearance.  HENT:     Head: Atraumatic.  Eyes:     Extraocular Movements: Extraocular movements intact.     Conjunctiva/sclera: Conjunctivae normal.  Neck:     Musculoskeletal: Normal range of motion and neck supple.  Cardiovascular:     Rate and Rhythm: Normal rate and regular rhythm.  Pulmonary:     Effort: Pulmonary effort is normal.     Breath sounds: Normal breath sounds.  Musculoskeletal:     Comments: ROM at  baseline  Skin:    General: Skin is warm and dry.  Neurological:     General: No focal deficit present.     Mental Status: He is oriented to person, place, and time.  Psychiatric:        Mood and Affect: Mood normal.        Thought Content: Thought content normal.        Judgment: Judgment normal.     Results for orders placed or performed in visit on 05/30/19  Comprehensive metabolic panel  Result Value Ref Range   Glucose 86 65 - 99 mg/dL   BUN 15 6 - 24 mg/dL   Creatinine, Ser 12/29/18 0.76 - 1.27 mg/dL   GFR calc non Af Amer 99 >59 mL/min/1.73   GFR calc Af Amer 115 >59 mL/min/1.73   BUN/Creatinine Ratio 17 9 - 20   Sodium 140 134 - 144 mmol/L   Potassium 4.8 3.5 - 5.2 mmol/L   Chloride 102 96 - 106 mmol/L   CO2 24 20 - 29 mmol/L   Calcium 9.6 8.7 - 10.2 mg/dL   Total Protein 6.9 6.0 - 8.5 g/dL   Albumin 4.4 3.8 - 4.9 g/dL   Globulin, Total 2.5 1.5 - 4.5 g/dL  Albumin/Globulin Ratio 1.8 1.2 - 2.2   Bilirubin Total 0.3 0.0 - 1.2 mg/dL   Alkaline Phosphatase 131 (H) 39 - 117 IU/L   AST 74 (H) 0 - 40 IU/L   ALT 66 (H) 0 - 44 IU/L  Lipid Panel w/o Chol/HDL Ratio  Result Value Ref Range   Cholesterol, Total 125 100 - 199 mg/dL   Triglycerides 213 (H) 0 - 149 mg/dL   HDL 23 (L) >39 mg/dL   VLDL Cholesterol Cal 35 5 - 40 mg/dL   LDL Chol Calc (NIH) 67 0 - 99 mg/dL      Assessment & Plan:   Problem List Items Addressed This Visit      Cardiovascular and Mediastinum   Essential hypertension - Primary    BPs stable and WNL, continue current regimen      Relevant Orders   Comprehensive metabolic panel (Completed)     Digestive   Esophageal reflux    Stable and under good control with prilosec, continue current regimen        Nervous and Auditory   Phantom limb pain (HCC)    Followed by Pain Management, stable on current regimen. Continue per their recommendations        Other   Hyperlipidemia    Recheck lipids, may need to add medication. Work on diet and  exercise modifications for better control      Relevant Orders   Lipid Panel w/o Chol/HDL Ratio (Completed)    Other Visit Diagnoses    Flu vaccine need       Relevant Orders   Flu Vaccine QUAD 36+ mos IM (Completed)       Follow up plan: Return in about 6 months (around 11/28/2019) for CPE.

## 2019-05-31 LAB — COMPREHENSIVE METABOLIC PANEL
ALT: 66 IU/L — ABNORMAL HIGH (ref 0–44)
AST: 74 IU/L — ABNORMAL HIGH (ref 0–40)
Albumin/Globulin Ratio: 1.8 (ref 1.2–2.2)
Albumin: 4.4 g/dL (ref 3.8–4.9)
Alkaline Phosphatase: 131 IU/L — ABNORMAL HIGH (ref 39–117)
BUN/Creatinine Ratio: 17 (ref 9–20)
BUN: 15 mg/dL (ref 6–24)
Bilirubin Total: 0.3 mg/dL (ref 0.0–1.2)
CO2: 24 mmol/L (ref 20–29)
Calcium: 9.6 mg/dL (ref 8.7–10.2)
Chloride: 102 mmol/L (ref 96–106)
Creatinine, Ser: 0.88 mg/dL (ref 0.76–1.27)
GFR calc Af Amer: 115 mL/min/{1.73_m2} (ref >59)
GFR calc non Af Amer: 99 mL/min/{1.73_m2} (ref >59)
Globulin, Total: 2.5 g/dL (ref 1.5–4.5)
Glucose: 86 mg/dL (ref 65–99)
Potassium: 4.8 mmol/L (ref 3.5–5.2)
Sodium: 140 mmol/L (ref 134–144)
Total Protein: 6.9 g/dL (ref 6.0–8.5)

## 2019-05-31 LAB — LIPID PANEL W/O CHOL/HDL RATIO
Cholesterol, Total: 125 mg/dL (ref 100–199)
HDL: 23 mg/dL — ABNORMAL LOW (ref >39)
LDL Chol Calc (NIH): 67 mg/dL (ref 0–99)
Triglycerides: 213 mg/dL — ABNORMAL HIGH (ref 0–149)
VLDL Cholesterol Cal: 35 mg/dL (ref 5–40)

## 2019-06-03 NOTE — Assessment & Plan Note (Signed)
Followed by Pain Management, stable on current regimen. Continue per their recommendations

## 2019-06-03 NOTE — Assessment & Plan Note (Signed)
Recheck lipids, may need to add medication. Work on diet and exercise modifications for better control

## 2019-06-03 NOTE — Assessment & Plan Note (Signed)
Stable and under good control with prilosec, continue current regimen

## 2019-06-03 NOTE — Assessment & Plan Note (Signed)
BPs stable and WNL, continue current regimen 

## 2019-07-01 ENCOUNTER — Encounter: Payer: Self-pay | Admitting: Student in an Organized Health Care Education/Training Program

## 2019-07-02 ENCOUNTER — Encounter: Payer: Self-pay | Admitting: Student in an Organized Health Care Education/Training Program

## 2019-07-02 ENCOUNTER — Ambulatory Visit
Payer: Medicare Other | Attending: Student in an Organized Health Care Education/Training Program | Admitting: Student in an Organized Health Care Education/Training Program

## 2019-07-02 ENCOUNTER — Other Ambulatory Visit: Payer: Self-pay

## 2019-07-02 DIAGNOSIS — D700 Congenital agranulocytosis: Secondary | ICD-10-CM

## 2019-07-02 DIAGNOSIS — S48911S Complete traumatic amputation of right shoulder and upper arm, level unspecified, sequela: Secondary | ICD-10-CM

## 2019-07-02 DIAGNOSIS — M79604 Pain in right leg: Secondary | ICD-10-CM

## 2019-07-02 DIAGNOSIS — I10 Essential (primary) hypertension: Secondary | ICD-10-CM

## 2019-07-02 DIAGNOSIS — G546 Phantom limb syndrome with pain: Secondary | ICD-10-CM | POA: Diagnosis not present

## 2019-07-02 DIAGNOSIS — G894 Chronic pain syndrome: Secondary | ICD-10-CM

## 2019-07-02 DIAGNOSIS — G8929 Other chronic pain: Secondary | ICD-10-CM

## 2019-07-02 DIAGNOSIS — S88919A Complete traumatic amputation of unspecified lower leg, level unspecified, initial encounter: Secondary | ICD-10-CM | POA: Diagnosis not present

## 2019-07-02 MED ORDER — HYDROCODONE-ACETAMINOPHEN 10-325 MG PO TABS: 1.0000 | ORAL_TABLET | Freq: Two times a day (BID) | ORAL | 0 refills | Status: DC | PRN

## 2019-07-02 MED ORDER — HYDROCODONE-ACETAMINOPHEN 10-325 MG PO TABS: 1.0000 | ORAL_TABLET | Freq: Two times a day (BID) | ORAL | 0 refills | Status: AC | PRN

## 2019-07-02 NOTE — Progress Notes (Signed)
Pain Management Virtual Encounter Note - Virtual Visit via Telephone Telehealth (real-time audio visits between healthcare provider and patient).   Patient's Phone No. & Preferred Pharmacy:  [phone] (home); [phone] (mobile); (Preferred) [phone] [id number] [place], [place] - [address] AT [facility] [address] [place] [address] [place] [address] Phone: [phone] Fax: [phone]    Pre-screening note:  Our staff contacted Brandon Myers and offered him an "in person", "face-to-face" appointment versus a telephone encounter. He indicated preferring the telephone encounter, at this time.   Reason for Virtual Visit: COVID-19*  Social distancing based on CDC and AMA recommendations.   I contacted Brandon Myers on 07/02/2019 via telephone.      I clearly identified myself as Edward Jolly, MD. I verified that I was speaking with the correct person using two identifiers (Name: Brandon Myers, and date of birth: 1966/05/17).  Advanced Informed Consent I sought verbal advanced consent from Brandon Myers for virtual visit interactions. I informed Brandon Myers of possible security and privacy concerns, risks, and limitations associated with providing "not-in-person" medical evaluation and management services. I also informed Brandon Myers of the availability of "in-person" appointments. Finally, I informed him that there would be a charge for the virtual visit and that he could be  personally, fully or partially, financially responsible for it. Brandon Myers expressed understanding and agreed to proceed.   Historic Elements   Brandon Myers is a 54 y.o. year old, male patient evaluated today after his last encounter by our practice on 04/11/2019. Brandon Myers  has a past medical history of Allergy, Controlled substance agreement signed (04/02/2017), GERD (gastroesophageal reflux disease), Hypertension, and Neutropenia, congenital (HCC). He also  has a past  surgical history that includes Amputation; Appendectomy; Colon surgery (1994); Above elbow arm amputation (Right, 1993); and Leg amputation through knee (Right, 1993). Brandon Myers has a current medication list which includes the following prescription(s): filgrastim-sndz, gabapentin, gabapentin, hydrocodone-acetaminophen, hydrocodone-acetaminophen, hydrocodone-acetaminophen, loratadine, losartan, melatonin, meloxicam, metoprolol succinate, nortriptyline, omeprazole, and ventolin hfa. He  reports that he has never smoked. He has never used smokeless tobacco. He reports that he does not drink alcohol or use drugs. Brandon Myers is allergic to enalapril; hydrochlorothiazide; prochlorperazine; sulfa antibiotics; benadryl [diphenhydramine]; and strawberry extract.   HPI  Today, he is being contacted for medication management.   No change in medical history since last visit.  Patient's pain is at baseline.  Patient continues multimodal pain regimen as prescribed.  States that it provides pain relief and improvement in functional status.  Pharmacotherapy Assessment  Analgesic:  06/13/2019  1   04/11/2019  Hydrocodone-Acetamin 10-325 MG  75.00  30 Bi Lat   [id number]   Wal (5798)   0  25.00 MME  Comm Ins   [place]    Monitoring: Pharmacotherapy: No side-effects or adverse reactions reported.  PMP: PDMP reviewed during this encounter.       Compliance: No problems identified. Effectiveness: Clinically acceptable. Plan: Refer to "POC".  UDS:  Summary  Date Value Ref Range Status  04/11/2019 Note  Final    Comment:    ==================================================================== [employer] Select 13 (MW) ==================================================================== Test                             Result       Flag       Units Drug Present and Declared for  Prescription Verification   Hydrocodone                    716          EXPECTED   ng/mg creat   Dihydrocodeine                 224           EXPECTED   ng/mg creat   Norhydrocodone                 1202         EXPECTED   ng/mg creat    Sources of hydrocodone include scheduled prescription medications.    Dihydrocodeine and norhydrocodone are expected metabolites of    hydrocodone. Dihydrocodeine is also available as a scheduled    prescription medication. ==================================================================== Test                      Result    Flag   Units      Ref Range   Creatinine              45               mg/dL      >=40>=20 ==================================================================== Declared Medications:  The flagging and interpretation on this report are based on the  following declared medications.  Unexpected results may arise from  inaccuracies in the declared medications.  **Note: The testing scope of this panel includes these medications:  Hydrocodone (Norco)  **Note: The testing scope of this panel does not include the  following reported medications:  Acetaminophen (Norco)  Albuterol  Fluticasone (Flonase)  Gabapentin (Neurontin)  Loratadine (Claritin)  Losartan (Cozaar)  Melatonin  Meloxicam (Mobic)  Metoprolol (Toprol)  Nortriptyline (Pamelor)  Omeprazole (Prilosec) ==================================================================== For clinical consultation, please call [phone]. ====================================================================    Laboratory Chemistry Profile (12 mo)  Renal: 05/30/2019: BUN 15; BUN/Creatinine Ratio 17; Creatinine, Ser 0.88  Lab Results  Component Value Date   GFRAA 115 05/30/2019   GFRNONAA 99 05/30/2019   Hepatic: 05/30/2019: Albumin 4.4 Lab Results  Component Value Date   AST 74 (H) 05/30/2019   ALT 66 (H) 05/30/2019   Other: No results found for requested labs within last 8760 hours. Note: Above Lab results reviewed.  Imaging  DG Neck Soft Tissue CLINICAL DATA:  Esophageal foreign body. Foreign body sensation after  eating chicken. Difficulty swallowing.  EXAM: NECK SOFT TISSUES - 1+ VIEW  COMPARISON:  None.  FINDINGS: There is no evidence of retropharyngeal soft tissue swelling or epiglottic enlargement. The cervical airway is unremarkable. No subcutaneous emphysema. Soft tissues are non suspicious. No radio-opaque foreign body identified. Patient is edentulous.  IMPRESSION: Negative soft tissue neck radiographs. No radiopaque foreign body in the neck.  Electronically Signed   By: Narda RutherfordMelanie  Sanford M.D.   On: 10/27/2018 00:37   Assessment  The primary encounter diagnosis was Chronic pain syndrome. Diagnoses of 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(HCC), Phantom limb pain (HCC), Congenital neutropenia (HCC), Chronic pain of right lower extremity, and Amputation of right upper extremity, sequela (HCC) were also pertinent to this visit.  Plan of Care  I am having Brandon Myers start on HYDROcodone-acetaminophen and HYDROcodone-acetaminophen. I am also having him maintain his filgrastim-sndz, Ventolin HFA, Melatonin, omeprazole, meloxicam, losartan, loratadine, gabapentin, metoprolol succinate, gabapentin, nortriptyline, and HYDROcodone-acetaminophen.  Pharmacotherapy (Medications Ordered): Meds ordered this encounter  Medications  . HYDROcodone-acetaminophen (NORCO) 10-325 MG tablet    Sig:  Take 1-2 tablets by mouth every 12 (twelve) hours as needed for severe pain. Must last 30 days.    Dispense:  75 tablet    Refill:  0    [place] STOP ACT - Not applicable. Fill one day early if pharmacy is closed on scheduled refill date.  Marland Kitchen HYDROcodone-acetaminophen (NORCO) 10-325 MG tablet    Sig: Take 1-2 tablets by mouth every 12 (twelve) hours as needed for severe pain. Must last 30 days.    Dispense:  75 tablet    Refill:  0    [place] STOP ACT - Not applicable. Fill one day early if pharmacy is closed on scheduled refill date.  Marland Kitchen HYDROcodone-acetaminophen (NORCO) 10-325 MG tablet    Sig: Take 1-2 tablets by mouth every 12  (twelve) hours as needed for severe pain. Must last 30 days.    Dispense:  75 tablet    Refill:  0    [place] STOP ACT - Not applicable. Fill one day early if pharmacy is closed on scheduled refill date.   Follow-up plan:   Return in about 3 months (around 09/30/2019) for Medication Management, virtual.    Recent Visits Date Type Provider Dept  04/11/19 Office Visit Brandon Santa, MD [facility]  Showing recent visits within past 90 days and meeting all other requirements   Today's Visits Date Type Provider Dept  07/02/19 Office Visit Brandon Santa, MD [facility]  Showing today's visits and meeting all other requirements   Future Appointments No visits were found meeting these conditions.  Showing future appointments within next 90 days and meeting all other requirements   I discussed the assessment and treatment plan with the patient. The patient was provided an opportunity to ask questions and all were answered. The patient agreed with the plan and demonstrated an understanding of the instructions.  Patient advised to call back or seek an in-person evaluation if the symptoms or condition worsens.  Total duration of non-face-to-face encounter: 64minutes.  Note by: Brandon Santa, MD Date: 07/02/2019; Time: 2:49 PM  Note: This dictation was prepared with Dragon dictation. Any transcriptional errors that may result from this process are unintentional.  Disclaimer:  * Given the special circumstances of the COVID-19 pandemic, the federal government has announced that the Office for Civil Rights (OCR) will exercise its enforcement discretion and will not impose penalties on physicians using telehealth in the event of noncompliance with regulatory requirements under the [place] (HIPAA) in connection with the good faith provision of telehealth during the ZOXWR-60 national public health emergency. ([place])

## 2019-07-02 NOTE — Telephone Encounter (Signed)
Patient last seen 05/30/19.

## 2019-07-03 MED ORDER — LOSARTAN POTASSIUM 100 MG PO TABS: ORAL_TABLET | ORAL | 1 refills | Status: DC

## 2019-07-30 ENCOUNTER — Other Ambulatory Visit: Payer: Self-pay

## 2019-07-31 MED ORDER — NORTRIPTYLINE HCL 10 MG PO CAPS: ORAL_CAPSULE | ORAL | 0 refills | Status: DC

## 2019-07-31 MED ORDER — MELOXICAM 7.5 MG PO TABS: 7.5000 mg | ORAL_TABLET | Freq: Every day | ORAL | 1 refills | Status: DC

## 2019-07-31 MED ORDER — METOPROLOL SUCCINATE ER 100 MG PO TB24: ORAL_TABLET | ORAL | 0 refills | Status: DC

## 2019-09-17 ENCOUNTER — Other Ambulatory Visit: Payer: Self-pay | Admitting: Family Medicine

## 2019-09-24 ENCOUNTER — Ambulatory Visit: Payer: Medicare Other | Admitting: Family Medicine

## 2019-09-24 ENCOUNTER — Other Ambulatory Visit: Payer: Self-pay | Admitting: Family Medicine

## 2019-09-24 DIAGNOSIS — G546 Phantom limb syndrome with pain: Secondary | ICD-10-CM

## 2019-09-24 NOTE — Telephone Encounter (Signed)
Requested Prescriptions  Pending Prescriptions Disp Refills  . gabapentin (NEURONTIN) 300 MG capsule [Pharmacy Med Name: GABAPENTIN 300MG  CAPSULES] 90 capsule 0    Sig: TAKE ONE CAPSULE BY MOUTH DAILY AT MIDDAY     Neurology: Anticonvulsants - gabapentin Passed - 09/24/2019  9:17 AM      Passed - Valid encounter within last 12 months    Recent Outpatient Visits          3 months ago Essential hypertension   [facility], [place]   9 months ago Essential hypertension   [facility] [place], [place]   10 months ago Essential hypertension   [facility] [place]   1 year ago Acute maxillary sinusitis, recurrence not specified   [facility], [place]   1 year ago Essential hypertension   [facility] [place], [place], [place]      Future Appointments            [place], Maurice March, PA-C [facility], [facility]

## 2019-09-25 ENCOUNTER — Ambulatory Visit: Payer: Medicare Other | Admitting: Family Medicine

## 2019-09-25 ENCOUNTER — Telehealth (INDEPENDENT_AMBULATORY_CARE_PROVIDER_SITE_OTHER): Payer: Medicare Other | Admitting: Family Medicine

## 2019-09-25 ENCOUNTER — Encounter: Payer: Self-pay | Admitting: Family Medicine

## 2019-09-25 ENCOUNTER — Encounter: Payer: Self-pay | Admitting: Student in an Organized Health Care Education/Training Program

## 2019-09-25 ENCOUNTER — Other Ambulatory Visit: Payer: Self-pay

## 2019-09-25 ENCOUNTER — Telehealth: Payer: Self-pay | Admitting: *Deleted

## 2019-09-25 VITALS — BP 139/85 | HR 62 | Temp 98.2°F | Ht 68.0 in | Wt 197.1 lb

## 2019-09-25 VITALS — BP 139/85 | HR 62 | Temp 98.2°F | Ht 68.0 in | Wt 197.0 lb

## 2019-09-25 DIAGNOSIS — J0100 Acute maxillary sinusitis, unspecified: Secondary | ICD-10-CM | POA: Diagnosis not present

## 2019-09-25 MED ORDER — AMOXICILLIN-POT CLAVULANATE 875-125 MG PO TABS: 1.0000 | ORAL_TABLET | Freq: Two times a day (BID) | ORAL | 0 refills | Status: DC

## 2019-09-25 NOTE — Telephone Encounter (Signed)
Voicemail left for patient to call re; appt on 09/26/19.

## 2019-09-25 NOTE — Progress Notes (Signed)
BP 139/85   Pulse 62   Temp 98.2 F (36.8 C) (Oral)   Ht 5\' 8"  (1.727 m)   Wt 197 lb (89.4 kg)   BMI 29.95 kg/m    Subjective:    Patient ID: Brandon Myers, male    DOB: 1965/10/01, 54 y.o.   MRN: [id number]  HPI: Brandon Myers is a 54 y.o. male  Chief Complaint  Patient presents with  . Fatigue    scratchy throat x 2 weeks  . Nasal Congestion  . Cough    . This visit was completed via MyChart due to the restrictions of the COVID-19 pandemic. All issues as above were discussed and addressed. Physical exam was done as above through visual confirmation on MyChart. If it was felt that the patient should be evaluated in the office, they were directed there. The patient verbally consented to this visit. . Location of the patient: home . Location of the provider: work . Those involved with this call:  . Provider: Merrie Roof, PA-C . CMA: Lesle Chris, [place] . Front Desk/Registration: Jill Side  . Time spent on call: 15 minutes with patient face to face via video conference. More than 50% of this time was spent in counseling and coordination of care. 5 minutes total spent in review of patient's record and preparation of their chart. I verified patient identity using two factors (patient name and date of birth). Patient consents verbally to being seen via telemedicine visit today.   Nasal congestion, scratchy throat, weakness for about 2 weeks. Denies fevers, chills, sweats, significant cough, Cp, SOB. Taking dayquil, allergy regimen and tylenol without much relief. No sick contacts, recent travel.   Relevant past medical, surgical, family and social history reviewed and updated as indicated. Interim medical history since our last visit reviewed. Allergies and medications reviewed and updated.  Review of Systems  Per HPI unless specifically indicated above     Objective:    BP 139/85   Pulse 62   Temp 98.2 F (36.8 C) (Oral)   Ht 5\' 8"  (1.727 m)   Wt 197 lb (89.4 kg)    BMI 29.95 kg/m   Wt Readings from Last 3 Encounters:  09/25/19 197 lb (89.4 kg)  09/25/19 197 lb 2 oz (89.4 kg)  05/30/19 199 lb (90.3 kg)    Physical Exam Vitals and nursing note reviewed.  Constitutional:      General: He is not in acute distress.    Appearance: Normal appearance.  HENT:     Head: Atraumatic.     Right Ear: External ear normal.     Left Ear: External ear normal.     Nose: Congestion present.     Mouth/Throat:     Mouth: Mucous membranes are moist.     Pharynx: Oropharynx is clear. Posterior oropharyngeal erythema present.  Eyes:     Extraocular Movements: Extraocular movements intact.     Conjunctiva/sclera: Conjunctivae normal.  Cardiovascular:     Rate and Rhythm: Normal rate and regular rhythm.  Pulmonary:     Effort: Pulmonary effort is normal. No respiratory distress.  Musculoskeletal:        General: Normal range of motion.     Cervical back: Normal range of motion.  Skin:    General: Skin is dry.     Findings: No erythema or rash.  Neurological:     Mental Status: He is oriented to person, place, and time.  Psychiatric:  Mood and Affect: Mood normal.        Thought Content: Thought content normal.        Judgment: Judgment normal.     Results for orders placed or performed in visit on 05/30/19  Comprehensive metabolic panel  Result Value Ref Range   Glucose 86 65 - 99 mg/dL   BUN 15 6 - 24 mg/dL   Creatinine, Ser 8.58 0.76 - 1.27 mg/dL   GFR calc non Af Amer 99 >59 mL/min/1.73   GFR calc Af Amer 115 >59 mL/min/1.73   BUN/Creatinine Ratio 17 9 - 20   Sodium 140 134 - 144 mmol/L   Potassium 4.8 3.5 - 5.2 mmol/L   Chloride 102 96 - 106 mmol/L   CO2 24 20 - 29 mmol/L   Calcium 9.6 8.7 - 10.2 mg/dL   Total Protein 6.9 6.0 - 8.5 g/dL   Albumin 4.4 3.8 - 4.9 g/dL   Globulin, Total 2.5 1.5 - 4.5 g/dL   Albumin/Globulin Ratio 1.8 1.2 - 2.2   Bilirubin Total 0.3 0.0 - 1.2 mg/dL   Alkaline Phosphatase 131 (H) 39 - 117 IU/L   AST 74  (H) 0 - 40 IU/L   ALT 66 (H) 0 - 44 IU/L  Lipid Panel w/o Chol/HDL Ratio  Result Value Ref Range   Cholesterol, Total 125 100 - 199 mg/dL   Triglycerides 850 (H) 0 - 149 mg/dL   HDL 23 (L) >27 mg/dL   VLDL Cholesterol Cal 35 5 - 40 mg/dL   LDL Chol Calc (NIH) 67 0 - 99 mg/dL      Assessment & Plan:   Problem List Items Addressed This Visit    None    Visit Diagnoses    Acute non-recurrent maxillary sinusitis    -  Primary   Tx with augmentin, sinus rinses, mucinex, continued allergy regimen. F/u if not improving   Relevant Medications   amoxicillin-clavulanate (AUGMENTIN) 875-125 MG tablet       Follow up plan: Return if symptoms worsen or fail to improve.

## 2019-09-26 ENCOUNTER — Encounter: Payer: Self-pay | Admitting: Student in an Organized Health Care Education/Training Program

## 2019-09-26 ENCOUNTER — Ambulatory Visit
Payer: Medicare Other | Attending: Student in an Organized Health Care Education/Training Program | Admitting: Student in an Organized Health Care Education/Training Program

## 2019-09-26 DIAGNOSIS — G894 Chronic pain syndrome: Secondary | ICD-10-CM

## 2019-09-26 MED ORDER — HYDROCODONE-ACETAMINOPHEN 10-325 MG PO TABS: 1.0000 | ORAL_TABLET | Freq: Two times a day (BID) | ORAL | 0 refills | Status: AC | PRN

## 2019-09-26 MED ORDER — HYDROCODONE-ACETAMINOPHEN 10-325 MG PO TABS: 1.0000 | ORAL_TABLET | Freq: Two times a day (BID) | ORAL | 0 refills | Status: DC | PRN

## 2019-09-26 NOTE — Progress Notes (Signed)
Patient: Brandon Myers  Service Category: E/M  Provider: [name], MD  DOB: 1966-05-15  DOS: 09/26/2019  Location: Office  MRN: [id number]  Setting: Ambulatory outpatient  Referring Provider: Volney American,*  Type: Established Patient  Specialty: Interventional Pain Management  PCP: Volney American, PA-C  Location: Home  Delivery: TeleHealth     Virtual Encounter - Pain Management PROVIDER NOTE: Information contained herein reflects review and annotations entered in association with encounter. Interpretation of such information and data should be left to medically-trained personnel. Information provided to patient can be located elsewhere in the medical record under "Patient Instructions". Document created using STT-dictation technology, any transcriptional errors that may result from process are unintentional.    Contact & Pharmacy Preferred: [phone] Home: [phone] (home) Mobile: [phone] (mobile) E-mail: [email or web address]  [facility] [place] [place], [place] AT [address] [place] [address] Phone: [phone] Fax: [phone]   Pre-screening  Brandon Myers offered "in-person" vs "virtual" encounter. He indicated preferring virtual for this encounter.   Reason COVID-19*  Social distancing based on CDC and AMA recommendations.   I contacted [name] on 09/26/2019 via telephone.      I clearly identified myself as [name], MD. I verified that I was speaking with the correct person using two identifiers (Name: Brandon Myers, and date of birth: [birth year]).  This visit was completed via telephone due to the restrictions of the COVID-19 pandemic. All issues as above were discussed and addressed but no physical exam was performed. If it was felt that the patient should be evaluated in the office, they were directed there. The patient verbally consented to this visit. Patient was unable to complete  an audio/visual visit due to Technical difficulties and/or Lack of internet. Due to the catastrophic nature of the COVID-19 pandemic, this visit was done through audio contact only.  Location of the patient: home address (see Epic for details)  Location of the provider: office  Consent I sought verbal advanced consent from [name] for virtual visit interactions. I informed Brandon Myers of possible security and privacy concerns, risks, and limitations associated with providing "not-in-person" medical evaluation and management services. I also informed Brandon Myers of the availability of "in-person" appointments. Finally, I informed him that there would be a charge for the virtual visit and that he could be  personally, fully or partially, financially responsible for it. Brandon Myers expressed understanding and agreed to proceed.   Historic Elements   Brandon Myers is a 54 y.o. year old, male patient evaluated today after his last contact with our practice on 09/25/2019. 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 He also  has a past surgical history that includes Amputation; Appendectomy; Colon surgery (1994); Above elbow arm amputation (Right, 1993); and Leg amputation through knee (Right, 1993). Brandon Myers has a current medication list which includes the following prescription(s): filgrastim-sndz, gabapentin, gabapentin, [START ON 10/17/2019] hydrocodone-acetaminophen, [START ON 11/16/2019] hydrocodone-acetaminophen, [START ON 12/16/2019] hydrocodone-acetaminophen, loratadine, losartan, melatonin, meloxicam, metoprolol succinate, nortriptyline, omeprazole, ventolin hfa, and amoxicillin-clavulanate. He  reports that he has never smoked. He has never used smokeless tobacco. He reports that he does not drink alcohol or use drugs. Brandon Myers is allergic to enalapril;  hydrochlorothiazide; prochlorperazine; sulfa antibiotics; benadryl [diphenhydramine]; and strawberry extract.   HPI  Today, he is being contacted for medication management.   No change in medical history since last visit.  Patient's pain is at baseline.  Patient continues multimodal pain regimen as prescribed.  States that it provides pain relief and improvement in functional status.  Pharmacotherapy Assessment  Analgesic: 09/17/2019  1   07/02/2019  Hydrocodone-Acetamin 10-325 MG  75.00  30 Bi Lat   [id number]   Wal (5798)   0  25.00 MME  Medicare   [place]    Monitoring: [place] PMP: PDMP reviewed during this encounter.       Pharmacotherapy: No side-effects or adverse reactions reported. Compliance: No problems identified. Effectiveness: Clinically acceptable. Plan: Refer to "POC".  UDS:  Summary  Date Value Ref Range Status  04/11/2019 Note  Final    Comment:    ==================================================================== [employer] Select 13 (MW) ==================================================================== Test                             Result       Flag       Units Drug Present and Declared for Prescription Verification   Hydrocodone                    716          EXPECTED   ng/mg creat   Dihydrocodeine                 224          EXPECTED   ng/mg creat   Norhydrocodone                 1202         EXPECTED   ng/mg creat    Sources of hydrocodone include scheduled prescription medications.    Dihydrocodeine and norhydrocodone are expected metabolites of    hydrocodone. Dihydrocodeine is also available as a scheduled    prescription medication. ==================================================================== Test                      Result    Flag   Units      Ref Range   Creatinine              45               mg/dL      >=20 ==================================================================== Declared Medications:  The flagging and interpretation on this  report are based on the  following declared medications.  Unexpected results may arise from  inaccuracies in the declared medications.  **Note: The testing scope of this panel includes these medications:  Hydrocodone (Norco)  **Note: The testing scope of this panel does not include the  following reported medications:  Acetaminophen (Norco)  Albuterol  Fluticasone (Flonase)  Gabapentin (Neurontin)  Loratadine (Claritin)  Losartan (Cozaar)  Melatonin  Meloxicam (Mobic)  Metoprolol (Toprol)  Nortriptyline (Pamelor)  Omeprazole (Prilosec) ==================================================================== For clinical consultation, please call [phone]. ====================================================================    Laboratory Chemistry Profile   Renal Lab Results  Component Value Date   BUN 15 05/30/2019   CREATININE 0.88 05/30/2019   BCR 17 05/30/2019   GFRAA 115 05/30/2019   GFRNONAA 99 05/30/2019    Hepatic Lab Results  Component Value Date   AST 74 (H) 05/30/2019   ALT 66 (H) 05/30/2019   ALBUMIN 4.4 05/30/2019   ALKPHOS 131 (H) 05/30/2019    Electrolytes Lab Results  Component Value Date  NA 140 05/30/2019   K 4.8 05/30/2019   CL 102 05/30/2019   CALCIUM 9.6 05/30/2019    Bone No results found for: VD25OH, VD125OH2TOT, IA1655VZ4, MO7078ML5, 25OHVITD1, 25OHVITD2, 25OHVITD3, TESTOFREE, TESTOSTERONE  Inflammation (CRP: Acute Phase) (ESR: Chronic Phase) No results found for: CRP, ESRSEDRATE, LATICACIDVEN    Note: Above Lab results reviewed.  Imaging  DG Neck Soft Tissue CLINICAL DATA:  Esophageal foreign body. Foreign body sensation after eating chicken. Difficulty swallowing.  EXAM: NECK SOFT TISSUES - 1+ VIEW  COMPARISON:  None.  FINDINGS: There is no evidence of retropharyngeal soft tissue swelling or epiglottic enlargement. The cervical airway is unremarkable. No subcutaneous emphysema. Soft tissues are non suspicious.  No radio-opaque foreign body identified. Patient is edentulous.  IMPRESSION: Negative soft tissue neck radiographs. No radiopaque foreign body in the neck.  Electronically Signed   By: Keith Rake M.D.   On: 10/27/2018 00:37  Assessment  The encounter diagnosis was Chronic pain syndrome.  Plan of Care   Mr. WARREN LINDAHL has a current medication list which includes the following long-term medication(s): gabapentin, gabapentin, loratadine, losartan, metoprolol succinate, nortriptyline, omeprazole, and ventolin hfa.  Pharmacotherapy (Medications Ordered): Meds ordered this encounter  Medications  . HYDROcodone-acetaminophen (NORCO) 10-325 MG tablet    Sig: Take 1-2 tablets by mouth every 12 (twelve) hours as needed for severe pain. Must last 30 days.    Dispense:  75 tablet    Refill:  0    [place] STOP ACT - Not applicable. Fill one day early if pharmacy is closed on scheduled refill date.  Marland Kitchen HYDROcodone-acetaminophen (NORCO) 10-325 MG tablet    Sig: Take 1-2 tablets by mouth every 12 (twelve) hours as needed for severe pain. Must last 30 days.    Dispense:  75 tablet    Refill:  0    [place] STOP ACT - Not applicable. Fill one day early if pharmacy is closed on scheduled refill date.  Marland Kitchen HYDROcodone-acetaminophen (NORCO) 10-325 MG tablet    Sig: Take 1-2 tablets by mouth every 12 (twelve) hours as needed for severe pain. Must last 30 days.    Dispense:  75 tablet    Refill:  0     STOP ACT - Not applicable. Fill one day early if pharmacy is closed on scheduled refill date.   Follow-up plan:   Return in about 3 months (around 12/24/2019) for Medication Management.    Recent Visits Date Type Provider Dept  07/02/19 Office Visit [name], MD [facility]  Showing recent visits within past 90 days and meeting all other requirements   Today's Visits Date Type Provider Dept  09/26/19 Office Visit [name], MD [facility]  Showing today's visits and  meeting all other requirements   Future Appointments No visits were found meeting these conditions.  Showing future appointments within next 90 days and meeting all other requirements   I discussed the assessment and treatment plan with the patient. The patient was provided an opportunity to ask questions and all were answered. The patient agreed with the plan and demonstrated an understanding of the instructions.  Patient advised to call back or seek an in-person evaluation if the symptoms or condition worsens.  Duration of encounter: 25 minutes.  Note by: [name], MD Date: 09/26/2019; Time: 12:15 PM

## 2019-10-03 ENCOUNTER — Encounter: Payer: Medicare Other | Admitting: Student in an Organized Health Care Education/Training Program

## 2019-10-06 NOTE — Progress Notes (Signed)
BP 139/85   Pulse 62   Temp 98.2 F (36.8 C) (Oral)   Ht 5\' 8"  (1.727 m)   Wt 197 lb 2 oz (89.4 kg)   BMI 29.97 kg/m    Subjective:    Patient ID: Brandon Myers, male    DOB: October 09, 1965, 54 y.o.   MRN: [id number]  HPI: Brandon Myers is a 54 y.o. male  Chief Complaint  Patient presents with  . Nasal Congestion    scratchy  throat x 2 weeks. has tried OTC medication  . Cough    . This visit was completed via MyChart due to the restrictions of the COVID-19 pandemic. All issues as above were discussed and addressed. Physical exam was done as above through visual confirmation on MyChart. If it was felt that the patient should be evaluated in the office, they were directed there. The patient verbally consented to this visit. . Location of the patient: home . Location of the provider: work . Those involved with this call:  . Provider: Merrie Roof, PA-C . CMA: Lesle Chris, [place] . Front Desk/Registration: Jill Side  . Time spent on call: 15 minutes with patient face to face via video conference. More than 50% of this time was spent in counseling and coordination of care. 5 minutes total spent in review of patient's record and preparation of their chart. I verified patient identity using two factors (patient name and date of birth). Patient consents verbally to being seen via telemedicine visit today.   Presenting today for 2 weeks of sore throat, productive cough, sinus pain and pressure. Denies fever, chills, CP, SOB, wheezing, HAs. No sick contacts, recent travel. Taking allergy regimen without relief.   Relevant past medical, surgical, family and social history reviewed and updated as indicated. Interim medical history since our last visit reviewed. Allergies and medications reviewed and updated.  Review of Systems  Per HPI unless specifically indicated above     Objective:    BP 139/85   Pulse 62   Temp 98.2 F (36.8 C) (Oral)   Ht 5\' 8"  (1.727 m)   Wt 197 lb 2 oz  (89.4 kg)   BMI 29.97 kg/m   Wt Readings from Last 3 Encounters:  09/25/19 197 lb (89.4 kg)  09/25/19 197 lb 2 oz (89.4 kg)  05/30/19 199 lb (90.3 kg)    Physical Exam Vitals and nursing note reviewed.  Constitutional:      General: He is not in acute distress.    Appearance: Normal appearance.  HENT:     Head: Atraumatic.     Right Ear: External ear normal.     Left Ear: External ear normal.     Nose: Congestion present.     Mouth/Throat:     Mouth: Mucous membranes are moist.     Pharynx: Oropharynx is clear. Posterior oropharyngeal erythema present.  Eyes:     Extraocular Movements: Extraocular movements intact.     Conjunctiva/sclera: Conjunctivae normal.  Cardiovascular:     Rate and Rhythm: Normal rate and regular rhythm.  Pulmonary:     Effort: Pulmonary effort is normal. No respiratory distress.  Musculoskeletal:        General: Normal range of motion.     Cervical back: Normal range of motion.  Skin:    General: Skin is dry.     Findings: No erythema or rash.  Neurological:     Mental Status: He is oriented to person, place, and time.  Psychiatric:  Mood and Affect: Mood normal.        Thought Content: Thought content normal.        Judgment: Judgment normal.     Results for orders placed or performed in visit on 05/30/19  Comprehensive metabolic panel  Result Value Ref Range   Glucose 86 65 - 99 mg/dL   BUN 15 6 - 24 mg/dL   Creatinine, Ser 3.81 0.76 - 1.27 mg/dL   GFR calc non Af Amer 99 >59 mL/min/1.73   GFR calc Af Amer 115 >59 mL/min/1.73   BUN/Creatinine Ratio 17 9 - 20   Sodium 140 134 - 144 mmol/L   Potassium 4.8 3.5 - 5.2 mmol/L   Chloride 102 96 - 106 mmol/L   CO2 24 20 - 29 mmol/L   Calcium 9.6 8.7 - 10.2 mg/dL   Total Protein 6.9 6.0 - 8.5 g/dL   Albumin 4.4 3.8 - 4.9 g/dL   Globulin, Total 2.5 1.5 - 4.5 g/dL   Albumin/Globulin Ratio 1.8 1.2 - 2.2   Bilirubin Total 0.3 0.0 - 1.2 mg/dL   Alkaline Phosphatase 131 (H) 39 - 117 IU/L    AST 74 (H) 0 - 40 IU/L   ALT 66 (H) 0 - 44 IU/L  Lipid Panel w/o Chol/HDL Ratio  Result Value Ref Range   Cholesterol, Total 125 100 - 199 mg/dL   Triglycerides 829 (H) 0 - 149 mg/dL   HDL 23 (L) >93 mg/dL   VLDL Cholesterol Cal 35 5 - 40 mg/dL   LDL Chol Calc (NIH) 67 0 - 99 mg/dL      Assessment & Plan:   Problem List Items Addressed This Visit    None    Visit Diagnoses    Acute non-recurrent maxillary sinusitis    -  Primary   Tx with augmentin, sinus rinses, continued allergy regimen. F/u if not improving       Follow up plan: Return if symptoms worsen or fail to improve.

## 2019-10-27 ENCOUNTER — Other Ambulatory Visit: Payer: Self-pay | Admitting: Family Medicine

## 2019-10-27 DIAGNOSIS — K219 Gastro-esophageal reflux disease without esophagitis: Secondary | ICD-10-CM

## 2019-10-27 NOTE — Telephone Encounter (Signed)
Requested Prescriptions  Pending Prescriptions Disp Refills  . omeprazole (PRILOSEC) 40 MG capsule [Pharmacy Med Name: OMEPRAZOLE 40MG  CAPSULES] 90 capsule 3    Sig: TAKE 1 CAPSULE(40 MG) BY MOUTH DAILY     Gastroenterology: Proton Pump Inhibitors Passed - 10/27/2019 10:11 AM      Passed - Valid encounter within last 12 months    Recent Outpatient Visits          1 month ago Acute non-recurrent maxillary sinusitis   [facility], [place]   1 month ago Acute non-recurrent maxillary sinusitis   [facility] [place]   5 months ago Essential hypertension   [facility] [place], [place]   10 months ago Essential hypertension   [facility] [place]   11 months ago Essential hypertension   [facility] [place], [place], [place]      Future Appointments            In 2 weeks [facility]

## 2019-11-11 ENCOUNTER — Ambulatory Visit (INDEPENDENT_AMBULATORY_CARE_PROVIDER_SITE_OTHER): Payer: Medicare Other

## 2019-11-11 ENCOUNTER — Telehealth: Payer: Self-pay | Admitting: Family Medicine

## 2019-11-11 DIAGNOSIS — Z0000 Encounter for general adult medical examination without abnormal findings: Secondary | ICD-10-CM | POA: Diagnosis not present

## 2019-11-11 NOTE — Progress Notes (Signed)
Subjective:   Brandon Myers is a 54 y.o. male who presents for Medicare Annual/Subsequent preventive examination.  This visit is being conducted via phone call  - after an attmept to do on video chat - due to the COVID-19 pandemic. This patient has given me verbal consent via phone to conduct this visit, patient states they are participating from their home address. Some vital signs may be absent or patient reported.   Patient identification: identified by name, DOB, and current address.    Review of Systems:   Cardiac Risk Factors include: male gender;hypertension     Objective:    Vitals: There were no vitals taken for this visit.  There is no height or weight on file to calculate BMI.  Advanced Directives 11/11/2019 10/26/2018 10/09/2018 07/10/2018 04/11/2018 02/28/2018 02/14/2018  Does Patient Have a Medical Advance Directive? No No Yes Yes No Yes No  Type of Advance Directive - Living will Living will [place];Living will - Living will -  Does patient want to make changes to medical advance directive? - - - - - - -  Copy of [place] in Chart? - - - No - copy requested - - -  Would patient like information on creating a medical advance directive? - No - Patient declined - - No - Patient declined - Yes (MAU/Ambulatory/Procedural Areas - Information given)    Tobacco Social History   Tobacco Use  Smoking Status Never Smoker  Smokeless Tobacco Never Used     Counseling given: Not Answered   Clinical Intake:  Pre-visit preparation completed: Yes  Pain : 0-10 Pain Score: 2  Pain Type: Acute pain Pain Location: Hip Pain Descriptors / Indicators: Aching Pain Onset: In the past 7 days     Nutritional Risks: None Diabetes: No  How often do you need to have someone help you when you read instructions, pamphlets, or other written materials from your doctor or pharmacy?: 1 - Never  Interpreter Needed?: No  Information entered by ::  Timeka Goette,LPN  Past Medical History:  Diagnosis Date  . Allergy   . Controlled substance agreement signed 04/02/2017  . GERD (gastroesophageal reflux disease)   . Hypertension   . 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 Phantom limb pain [facility])    Past Surgical History:  Procedure Laterality Date  . ABOVE ELBOW ARM AMPUTATION Right 1993  . AMPUTATION    . APPENDECTOMY    . COLON SURGERY  1994  . LEG AMPUTATION THROUGH KNEE Right 1993   Family History  Problem Relation Age of Onset  . Heart Problems Mother   . Asthma Mother   . Depression Mother   . Thyroid disease Mother   . Alzheimer's disease Mother   . Hypertension Mother   . Stroke Maternal Grandmother   . Cancer Maternal Grandmother   . Cancer Father        skin  . Hypertension Father   . Cancer Paternal Grandmother   . Cancer Paternal Grandfather    Social History   Socioeconomic History  . Marital status: Married    Spouse name: Not on file  . Number of children: Not on file  . Years of education: Not on file  . Highest education level: Associate degree: academic program  Occupational History  . Not on file  Tobacco Use  . Smoking status: Never Smoker  . Smokeless tobacco: Never Used  Substance and Sexual Activity  . Alcohol use: Never  Alcohol/week: 0.0 standard drinks  . Drug use: Never  . Sexual activity: Never  Other Topics Concern  . Not on file  Social History Narrative  . Not on file   Social Determinants of Health   Financial Resource Strain:   . Difficulty of Paying Living Expenses:   Food Insecurity:   . Worried About Programme researcher, broadcasting/film/video in the Last Year:   . Barista in the Last Year:   Transportation Needs:   . Freight forwarder (Medical):   Marland Kitchen Lack of Transportation (Non-Medical):   Physical Activity:   . Days of Exercise per Week:   . Minutes of Exercise per Session:   Stress:   . Feeling of Stress :   Social Connections:   . Frequency of Communication with Friends  and Family:   . Frequency of Social Gatherings with Friends and Family:   . Attends Religious Services:   . Active Member of Clubs or Organizations:   . Attends Banker Meetings:   Marland Kitchen Marital Status:     Outpatient Encounter Medications as of 11/11/2019  Medication Sig  . Filgrastim-sndz (ZARXIO) 480 MCG/0.8ML SOSY Inject 0.8 mLs into the muscle every other day.  . gabapentin (NEURONTIN) 300 MG capsule TAKE ONE CAPSULE BY MOUTH DAILY AT MIDDAY  . gabapentin (NEURONTIN) 800 MG tablet Take 1 tablet (800 mg total) by mouth 3 (three) times daily.  Marland Kitchen HYDROcodone-acetaminophen (NORCO) 10-325 MG tablet Take 1-2 tablets by mouth every 12 (twelve) hours as needed for severe pain. Must last 30 days.  Melene Muller ON 11/16/2019] HYDROcodone-acetaminophen (NORCO) 10-325 MG tablet Take 1-2 tablets by mouth every 12 (twelve) hours as needed for severe pain. Must last 30 days.  Marland Kitchen loratadine (CLARITIN) 10 MG tablet Take 1 tablet (10 mg total) by mouth daily.  Marland Kitchen losartan (COZAAR) 100 MG tablet TAKE 1 TABLET(100 MG) BY MOUTH DAILY  . Melatonin 10 MG TABS Take 10 mg by mouth at bedtime.  . meloxicam (MOBIC) 7.5 MG tablet Take 1 tablet (7.5 mg total) by mouth daily. (Patient taking differently: Take 7.5 mg by mouth daily as needed. )  . metoprolol succinate (TOPROL-XL) 100 MG 24 hr tablet TAKE 1 TABLET BY MOUTH EVERY DAY WITH OR IMMEDIATELY FOLLOWING A MEAL  . nortriptyline (PAMELOR) 10 MG capsule TAKE 1 CAPSULE(10 MG) BY MOUTH AT BEDTIME  . omeprazole (PRILOSEC) 40 MG capsule TAKE 1 CAPSULE(40 MG) BY MOUTH DAILY  . VENTOLIN HFA 108 (90 Base) MCG/ACT inhaler INHALE 2 PUFFS BY MOUTH FOUR TIMES DAILY AS NEEDED  . [START ON 12/16/2019] HYDROcodone-acetaminophen (NORCO) 10-325 MG tablet Take 1-2 tablets by mouth every 12 (twelve) hours as needed for severe pain. Must last 30 days. (Patient not taking: Reported on 11/11/2019)  . [DISCONTINUED] amoxicillin-clavulanate (AUGMENTIN) 875-125 MG tablet Take 1 tablet by  mouth 2 (two) times daily.   No facility-administered encounter medications on file as of 11/11/2019.    Activities of Daily Living In your present state of health, do you have any difficulty performing the following activities: 11/11/2019  Hearing? N  Comment no hearing aids  Vision? Y  Comment eyeglasses, [facility], bilateral floaters  Difficulty concentrating or making decisions? N  Walking or climbing stairs? N  Dressing or bathing? N  Doing errands, shopping? N  Preparing Food and eating ? N  Using the Toilet? N  In the past six months, have you accidently leaked urine? N  Do you have problems with loss of bowel  control? N  Managing your Medications? N  Managing your Finances? N  Housekeeping or managing your Housekeeping? N  Some recent data might be hidden    Patient Care Team: Particia Nearing, PA-C as PCP - General (Family Medicine) Edward Jolly, MD as Consulting Physician (Pain Medicine) Wallace Keller (Inactive) as Referring Physician   Assessment:   This is a routine wellness examination for Brandon Myers.  Exercise Activities and Dietary recommendations Current Exercise Habits: The patient does not participate in regular exercise at present, Exercise limited by: None identified  Goals Addressed   None     Fall Risk: Fall Risk  11/11/2019 05/30/2019 04/11/2019 10/09/2018 07/10/2018  Falls in the past year? 1 1 0 0 1  Comment - - - - -  Number falls in past yr: 1 0 - - 1  Injury with Fall? 0 0 - - 0  Risk Factor Category  - - - - -  Risk for fall due to : Impaired balance/gait;Impaired mobility - - - Impaired balance/gait  Follow up - - - - -    FALL RISK PREVENTION PERTAINING TO THE HOME:  Any stairs in or around the home? Yes  If so, are there any without handrails? No   Home free of loose throw rugs in walkways, pet beds, electrical cords, etc? Yes  Adequate lighting in your home to reduce risk of falls? Yes   ASSISTIVE DEVICES UTILIZED TO  PREVENT FALLS:  Life alert? No  Use of a cane, walker or w/c? No  Grab bars in the bathroom? Yes  Shower chair or bench in shower? Yes  Elevated toilet seat or a handicapped toilet? No   TIMED UP AND GO:  Unable to perform   Depression Screen PHQ 2/9 Scores 11/11/2019 05/30/2019 10/09/2018 07/10/2018  PHQ - 2 Score 0 0 0 0  PHQ- 9 Score - 1 - -    Cognitive Function     6CIT Screen 02/28/2018 02/23/2017  What Year? 0 points 0 points  What month? 0 points 0 points  What time? 0 points 0 points  Count back from 20 0 points 0 points  Months in reverse 0 points 0 points  Repeat phrase 0 points 0 points  Total Score 0 0    Immunization History  Administered Date(s) Administered  . Influenza,inj,Quad PF,6+ Mos 04/27/2015, 05/10/2016, 05/08/2017, 04/25/2018, 05/30/2019  . Pneumococcal Conjugate-13 01/10/2017    Qualifies for Shingles Vaccine? Yes  Zostavax completed n/a. Due for Shingrix. Education has been provided regarding the importance of this vaccine. Pt has been advised to call insurance company to determine out of pocket expense. Advised may also receive vaccine at local pharmacy or Health Dept. Verbalized acceptance and understanding.  Tdap: up to date   Flu Vaccine: up to date   Pneumococcal Vaccine: up to date   Covid-19 Vaccine: Information provided  Screening Tests Health Maintenance  Topic Date Due  . HIV Screening  Never done  . COLONOSCOPY  05/29/2020 (Originally 05/01/2018)  . INFLUENZA VACCINE  03/01/2020  . TETANUS/TDAP  08/04/2024   Cancer Screenings:  Colorectal Screening: declined   Lung Cancer Screening: (Low Dose CT Chest recommended if Age 70-80 years, 30 pack-year currently smoking OR have quit w/in 15years.) does not qualify.    Additional Screening:  Hepatitis C Screening: does not qualify  Vision Screening: Recommended annual ophthalmology exams for early detection of glaucoma and other disorders of the eye. Is the patient up to date  with their annual eye  exam?  Yes  Who is the provider or what is the name of the office in which the pt attends annual eye exams? [facility]   Dental Screening: Recommended annual dental exams for proper oral hygiene  Community Resource Referral:  CRR required this visit?  No       Plan:  I have personally reviewed and addressed the Medicare Annual Wellness questionnaire and have noted the following in the patient's chart:  A. Medical and social history B. Use of alcohol, tobacco or illicit drugs  C. Current medications and supplements D. Functional ability and status E.  Nutritional status F.  Physical activity G. Advance directives H. List of other physicians I.  Hospitalizations, surgeries, and ER visits in previous 12 months J.  Vitals K. Screenings such as hearing and vision if needed, cognitive and depression L. Referrals and appointments   In addition, I have reviewed and discussed with patient certain preventive protocols, quality metrics, and best practice recommendations. A written personalized care plan for preventive services as well as general preventive health recommendations were provided to patient.   Signed,   Collene Schlichter, LPN  03/25/538 Nurse Health Advisor   Nurse Notes: patient states he has been having some right hip pain in the last couple days. States it is an aching joint type pain. Discussed his prosthetic on this side could be causing some discomfort, [facility] two and will discuss with them as well and sees Dr.Lateef in May. Will call if pain worsens prior to then.

## 2019-11-11 NOTE — Patient Instructions (Signed)
Brandon Myers , Thank you for taking time to come for your Medicare Wellness Visit. I appreciate your ongoing commitment to your health goals. Please review the following plan we discussed and let me know if I can assist you in the future.   Screening recommendations/referrals: Colonoscopy: declined  Recommended yearly ophthalmology/optometry visit for glaucoma screening and checkup Recommended yearly dental visit for hygiene and checkup  Vaccinations: Influenza vaccine: up to date  Pneumococcal vaccine: up to date  Tdap vaccine: up to date  Shingles vaccine: shingrix eligible    Covid-19: We are recommending the vaccine to everyone who has not had an allergic reaction to any of the components of the vaccine. If you have specific questions about the vaccine, please bring them up with your health care provider to discuss them.   We will likely not be getting the vaccine in the office for the first rounds of vaccinations. The way they are releasing the vaccines is going to be through the health systems (like [facility], [facility], [facility], [facility]), through your county health department, or through the pharmacies.   The [facility] Department is giving vaccines to those 65+ and Health Care Workers Teachers and Child Care providers start 09/25/19, Essential workers start 3/10 and those with co-morbidities start 10/23/19 Call [phone] to schedule  If you are 65+ you can get a vaccine through [facility] by signing up for an appointment.  You can sign up by going to: [email or web address].  You can get more information by going to: [email or web address]  [employer] next door is giving the [employer]- you can call [phone] or stop by there to schedule.  Advanced directives: Advance directive discussed with you today. I have provided a copy for you to complete at home and have notarized. Once this is complete please bring a copy in to our office so we can scan it into  your chart.  Conditions/risks identified: [facility].   Next appointment: Follow up in one year for your annual wellness visit   Preventive Care 40-64 Years, Male Preventive care refers to lifestyle choices and visits with your health care provider that can promote health and wellness. What does preventive care include?  A yearly physical exam. This is also called an annual well check.  Dental exams once or twice a year.  Routine eye exams. Ask your health care provider how often you should have your eyes checked.  Personal lifestyle choices, including:  Daily care of your teeth and gums.  Regular physical activity.  Eating a healthy diet.  Avoiding tobacco and drug use.  Limiting alcohol use.  Practicing safe sex.  Taking low-dose aspirin every day starting at age 36. What happens during an annual well check? The services and screenings done by your health care provider during your annual well check will depend on your age, overall health, lifestyle risk factors, and family history of disease. Counseling  Your health care provider may ask you questions about your:  Alcohol use.  Tobacco use.  Drug use.  Emotional well-being.  Home and relationship well-being.  Sexual activity.  Eating habits.  Work and work Astronomer. Screening  You may have the following tests or measurements:  Height, weight, and BMI.  Blood pressure.  Lipid and cholesterol levels. These may be checked every 5 years, or more frequently if you are over 58 years old.  Skin check.  Lung cancer screening. You may have this screening every year starting at age 49 if  you have a 30-pack-year history of smoking and currently smoke or have quit within the past 15 years.  Fecal occult blood test (FOBT) of the stool. You may have this test every year starting at age 40.  Flexible sigmoidoscopy or colonoscopy. You may have a sigmoidoscopy every 5 years or a  colonoscopy every 10 years starting at age 76.  Prostate cancer screening. Recommendations will vary depending on your family history and other risks.  Hepatitis C blood test.  Hepatitis B blood test.  Sexually transmitted disease (STD) testing.  Diabetes screening. This is done by checking your blood sugar (glucose) after you have not eaten for a while (fasting). You may have this done every 1-3 years. Discuss your test results, treatment options, and if necessary, the need for more tests with your health care provider. Vaccines  Your health care provider may recommend certain vaccines, such as:  Influenza vaccine. This is recommended every year.  Tetanus, diphtheria, and acellular pertussis (Tdap, Td) vaccine. You may need a Td booster every 10 years.  Zoster vaccine. You may need this after age 19.  Pneumococcal 13-valent conjugate (PCV13) vaccine. You may need this if you have certain conditions and have not been vaccinated.  Pneumococcal polysaccharide (PPSV23) vaccine. You may need one or two doses if you smoke cigarettes or if you have certain conditions. Talk to your health care provider about which screenings and vaccines you need and how often you need them. This information is not intended to replace advice given to you by your health care provider. Make sure you discuss any questions you have with your health care provider. Document Released: 08/14/2015 Document Revised: 04/06/2016 Document Reviewed: 05/19/2015 Elsevier Interactive Patient Education  2017 [place] Prevention in the Home Falls can cause injuries. They can happen to people of all ages. There are many things you can do to make your home safe and to help prevent falls. What can I do on the outside of my home?  Regularly fix the edges of walkways and driveways and fix any cracks.  Remove anything that might make you trip as you walk through a door, such as a raised step or threshold.  Trim any  bushes or trees on the path to your home.  Use bright outdoor lighting.  Clear any walking paths of anything that might make someone trip, such as rocks or tools.  Regularly check to see if handrails are loose or broken. Make sure that both sides of any steps have handrails.  Any raised decks and porches should have guardrails on the edges.  Have any leaves, snow, or ice cleared regularly.  Use sand or salt on walking paths during winter.  Clean up any spills in your garage right away. This includes oil or grease spills. What can I do in the bathroom?  Use night lights.  Install grab bars by the toilet and in the tub and shower. Do not use towel bars as grab bars.  Use non-skid mats or decals in the tub or shower.  If you need to sit down in the shower, use a plastic, non-slip stool.  Keep the floor dry. Clean up any water that spills on the floor as soon as it happens.  Remove soap buildup in the tub or shower regularly.  Attach bath mats securely with double-sided non-slip rug tape.  Do not have throw rugs and other things on the floor that can make you trip. What can I do in the bedroom?  Use night lights.  Make sure that you have a light by your bed that is easy to reach.  Do not use any sheets or blankets that are too big for your bed. They should not hang down onto the floor.  Have a firm chair that has side arms. You can use this for support while you get dressed.  Do not have throw rugs and other things on the floor that can make you trip. What can I do in the kitchen?  Clean up any spills right away.  Avoid walking on wet floors.  Keep items that you use a lot in easy-to-reach places.  If you need to reach something above you, use a strong step stool that has a grab bar.  Keep electrical cords out of the way.  Do not use floor polish or wax that makes floors slippery. If you must use wax, use non-skid floor wax.  Do not have throw rugs and other  things on the floor that can make you trip. What can I do with my stairs?  Do not leave any items on the stairs.  Make sure that there are handrails on both sides of the stairs and use them. Fix handrails that are broken or loose. Make sure that handrails are as long as the stairways.  Check any carpeting to make sure that it is firmly attached to the stairs. Fix any carpet that is loose or worn.  Avoid having throw rugs at the top or bottom of the stairs. If you do have throw rugs, attach them to the floor with carpet tape.  Make sure that you have a light switch at the top of the stairs and the bottom of the stairs. If you do not have them, ask someone to add them for you. What else can I do to help prevent falls?  Wear shoes that:  Do not have high heels.  Have rubber bottoms.  Are comfortable and fit you well.  Are closed at the toe. Do not wear sandals.  If you use a stepladder:  Make sure that it is fully opened. Do not climb a closed stepladder.  Make sure that both sides of the stepladder are locked into place.  Ask someone to hold it for you, if possible.  Clearly mark and make sure that you can see:  Any grab bars or handrails.  First and last steps.  Where the edge of each step is.  Use tools that help you move around (mobility aids) if they are needed. These include:  Canes.  Walkers.  Scooters.  Crutches.  Turn on the lights when you go into a dark area. Replace any light bulbs as soon as they burn out.  Set up your furniture so you have a clear path. Avoid moving your furniture around.  If any of your floors are uneven, fix them.  If there are any pets around you, be aware of where they are.  Review your medicines with your doctor. Some medicines can make you feel dizzy. This can increase your chance of falling. Ask your doctor what other things that you can do to help prevent falls. This information is not intended to replace advice given to  you by your health care provider. Make sure you discuss any questions you have with your health care provider. Document Released: 05/14/2009 Document Revised: 12/24/2015 Document Reviewed: 08/22/2014 Elsevier Interactive Patient Education  2017 [employer].

## 2019-11-11 NOTE — Telephone Encounter (Signed)
Copied from CRM [id number]. Topic: General - Other >> Nov 11, 2019  2:47 PM Elliot Gault wrote: Reason for CRM:  Patient confirmed 11/11/2019 appointment would be a telephone visit.

## 2019-11-28 ENCOUNTER — Other Ambulatory Visit: Payer: Self-pay | Admitting: Family Medicine

## 2019-12-18 ENCOUNTER — Telehealth: Payer: Self-pay

## 2019-12-18 NOTE — Telephone Encounter (Signed)
LM with family member to call office

## 2019-12-19 ENCOUNTER — Other Ambulatory Visit: Payer: Self-pay

## 2019-12-19 ENCOUNTER — Other Ambulatory Visit: Payer: Self-pay | Admitting: Family Medicine

## 2019-12-19 ENCOUNTER — Ambulatory Visit
Payer: Medicare Other | Attending: Student in an Organized Health Care Education/Training Program | Admitting: Student in an Organized Health Care Education/Training Program

## 2019-12-19 ENCOUNTER — Encounter: Payer: Self-pay | Admitting: Student in an Organized Health Care Education/Training Program

## 2019-12-19 DIAGNOSIS — D700 Congenital agranulocytosis: Secondary | ICD-10-CM

## 2019-12-19 DIAGNOSIS — G546 Phantom limb syndrome with pain: Secondary | ICD-10-CM | POA: Diagnosis not present

## 2019-12-19 DIAGNOSIS — S88919A Complete traumatic amputation of unspecified lower leg, level unspecified, initial encounter: Secondary | ICD-10-CM | POA: Diagnosis not present

## 2019-12-19 DIAGNOSIS — M79604 Pain in right leg: Secondary | ICD-10-CM

## 2019-12-19 DIAGNOSIS — I10 Essential (primary) hypertension: Secondary | ICD-10-CM

## 2019-12-19 DIAGNOSIS — G894 Chronic pain syndrome: Secondary | ICD-10-CM | POA: Diagnosis not present

## 2019-12-19 DIAGNOSIS — S48911S Complete traumatic amputation of right shoulder and upper arm, level unspecified, sequela: Secondary | ICD-10-CM

## 2019-12-19 DIAGNOSIS — G8929 Other chronic pain: Secondary | ICD-10-CM

## 2019-12-19 MED ORDER — HYDROCODONE-ACETAMINOPHEN 10-325 MG PO TABS: 1.0000 | ORAL_TABLET | Freq: Two times a day (BID) | ORAL | 0 refills | Status: AC | PRN

## 2019-12-19 MED ORDER — HYDROCODONE-ACETAMINOPHEN 10-325 MG PO TABS: 1.0000 | ORAL_TABLET | Freq: Two times a day (BID) | ORAL | 0 refills | Status: DC | PRN

## 2019-12-19 NOTE — Telephone Encounter (Signed)
Routing to provider  

## 2019-12-19 NOTE — Telephone Encounter (Signed)
Appt scheduled for 12/23/19

## 2019-12-19 NOTE — Telephone Encounter (Signed)
Overdue for 6 month f/u - please get him scheduled

## 2019-12-19 NOTE — Telephone Encounter (Signed)
Requested medication (s) are due for refill today: yes  Requested medication (s) are on the active medication list: yes  Last refill:  09/24/2019  Future visit scheduled:no  Notes to clinic: wanted to verify that patient is taking both doses before refill   Requested Prescriptions  Pending Prescriptions Disp Refills   gabapentin (NEURONTIN) 300 MG capsule [Pharmacy Med Name: GABAPENTIN 300MG  CAPSULES] 90 capsule 0    Sig: TAKE ONE CAPSULE BY MOUTH DAILY AT MIDDAY      Neurology: Anticonvulsants - gabapentin Passed - 12/19/2019 10:16 AM      Passed - Valid encounter within last 12 months    Recent Outpatient Visits           2 months ago Acute non-recurrent maxillary sinusitis   [facility], [place]   2 months ago Acute non-recurrent maxillary sinusitis   [facility] [place]   6 months ago Essential hypertension   [facility] [place], [place]   11 months ago Essential hypertension   [facility] [place]   1 year ago Essential hypertension   [facility] [place], [place]                gabapentin (NEURONTIN) 800 MG tablet [Pharmacy Med Name: GABAPENTIN 800MG  TABLETS] 270 tablet 3    Sig: TAKE 1 TABLET(800 MG) BY MOUTH THREE TIMES DAILY      Neurology: Anticonvulsants - gabapentin Passed - 12/19/2019 10:16 AM      Passed - Valid encounter within last 12 months    Recent Outpatient Visits           2 months ago Acute non-recurrent maxillary sinusitis   [facility] 12/21/2019, [facility]   2 months ago Acute non-recurrent maxillary sinusitis   [facility] [place], [place]   6 months ago Essential hypertension   [facility] [place], [place]   11 months ago Essential hypertension   [facility] [place], [place]   1 year ago Essential  hypertension   [facility] [place], [place]               Signed Prescriptions Disp Refills   losartan (COZAAR) 100 MG tablet 90 tablet 1    Sig: TAKE 1 TABLET BY MOUTH EVERY DAY      Cardiovascular:  Angiotensin Receptor Blockers Failed - 12/19/2019 10:16 AM      Failed - Cr in normal range and within 180 days    Creat  Date Value Ref Range Status  12/09/2016 0.79 0.60 - 1.35 mg/dL Final   Creatinine, Ser  Date Value Ref Range Status  05/30/2019 0.88 0.76 - 1.27 mg/dL Final          Failed - K in normal range and within 180 days    Potassium  Date Value Ref Range Status  05/30/2019 4.8 3.5 - 5.2 mmol/L Final          Passed - Patient is not pregnant      Passed - Last BP in normal range    BP Readings from Last 1 Encounters:  09/25/19 139/85          Passed - Valid encounter within last 6 months    Recent Outpatient Visits           2 months ago Acute non-recurrent maxillary sinusitis   [facility]  Volney American, PA-C   2 months ago Acute non-recurrent maxillary sinusitis   [facility] [facility] [place], [place]   6 months ago Essential hypertension   [place], [place], [place]   11 months ago Essential hypertension   [facility] Ro[facility] [place], [place]   1 year ago Essential hypertension   [facility] [place], [place]

## 2019-12-19 NOTE — Progress Notes (Signed)
Patient: Brandon Myers  Service Category: E/M  Provider: [name], MD  DOB: [birth year]  DOS: 12/19/2019  Location: Office  MRN: [id number]  Setting: Ambulatory outpatient  Referring Provider: Volney American,*  Type: Established Patient  Specialty: Interventional Pain Management  PCP: Volney American, PA-C  Location: Home  Delivery: TeleHealth     Virtual Encounter - Pain Management PROVIDER NOTE: Information contained herein reflects review and annotations entered in association with encounter. Interpretation of such information and data should be left to medically-trained personnel. Information provided to patient can be located elsewhere in the medical record under "Patient Instructions". Document created using STT-dictation technology, any transcriptional errors that may result from process are unintentional.    Contact & Pharmacy Preferred: [phone] Home: [phone] (home) Mobile: [phone] (mobile) E-mail: [email or web address] [place], [address] [place] [address] Phone: [phone] Fax: [phone]   Pre-screening  Mr. Harron offered "in-person" vs "virtual" encounter. He indicated preferring virtual for this encounter.   Reason COVID-19*  Social distancing based on CDC and AMA recommendations.   I contacted [name] on 12/19/2019 via video conference.      I clearly identified myself as [name], MD. I verified that I was speaking with the correct person using two identifiers (Name: EVAAN TIDWELL, and date of birth: 1965/11/19).  Consent I sought verbal advanced consent from [name] for virtual visit interactions. I informed Mr. Moehring of possible security and privacy concerns, risks, and limitations associated with providing "not-in-person" medical evaluation and management services. I also informed Mr. Rubenstein of the availability of "in-person"  appointments. Finally, I informed him that there would be a charge for the virtual visit and that he could be  personally, fully or partially, financially responsible for it. Mr. Barella expressed understanding and agreed to proceed.   Historic Elements   Mr. KENNEN STAMMER is a 54 y.o. year old, male patient evaluated today after his last contact with our practice on 12/18/2019. Mr. 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 He also  has a past surgical history that includes Amputation; Appendectomy; Colon surgery (1994); Above elbow arm amputation (Right, 1993); and Leg amputation through knee (Right, 1993). Mr. Dolecki has a current medication list which includes the following prescription(s): filgrastim-sndz, gabapentin, gabapentin, [START ON 01/19/2020] hydrocodone-acetaminophen, [START ON 02/18/2020] hydrocodone-acetaminophen, loratadine, melatonin, meloxicam, metoprolol succinate, nortriptyline, omeprazole, ventolin hfa, and losartan. He  reports that he has never smoked. He has never used smokeless tobacco. He reports that he does not drink alcohol or use drugs. Mr. Moomaw is allergic to enalapril; hydrochlorothiazide; prochlorperazine; sulfa antibiotics; benadryl [diphenhydramine]; and strawberry extract.   HPI  Today, he is being contacted for medication management.   No change in medical history since last visit.  Patient's pain is at baseline.  Patient continues multimodal pain regimen as prescribed.  States that it provides pain relief and improvement in functional status.  Pharmacotherapy Assessment  Analgesic: Hydrocodone 5 mg BID-TID PRN, #75/month MME= 25 Monitoring: [place] PMP: PDMP reviewed during this encounter.       Pharmacotherapy: No side-effects or adverse reactions reported. Compliance: No problems  identified. Effectiveness: Clinically acceptable. Plan: Refer to "POC".  UDS:  Summary  Date Value Ref Range Status  04/11/2019 Note  Final    Comment:    ==================================================================== [employer] Select 13 (MW) ==================================================================== Test                             Result       Flag       Units Drug Present and Declared for Prescription Verification   Hydrocodone                    716          EXPECTED   ng/mg creat   Dihydrocodeine                 224          EXPECTED   ng/mg creat   Norhydrocodone                 1202         EXPECTED   ng/mg creat    Sources of hydrocodone include scheduled prescription medications.    Dihydrocodeine and norhydrocodone are expected metabolites of    hydrocodone. Dihydrocodeine is also available as a scheduled    prescription medication. ==================================================================== Test                      Result    Flag   Units      Ref Range   Creatinine              45               mg/dL      >=20 ==================================================================== Declared Medications:  The flagging and interpretation on this report are based on the  following declared medications.  Unexpected results may arise from  inaccuracies in the declared medications.  **Note: The testing scope of this panel includes these medications:  Hydrocodone (Norco)  **Note: The testing scope of this panel does not include the  following reported medications:  Acetaminophen (Norco)  Albuterol  Fluticasone (Flonase)  Gabapentin (Neurontin)  Loratadine (Claritin)  Losartan (Cozaar)  Melatonin  Meloxicam (Mobic)  Metoprolol (Toprol)  Nortriptyline (Pamelor)  Omeprazole (Prilosec) ==================================================================== For clinical consultation, please call [phone]. ====================================================================    Laboratory Chemistry Profile   Renal Lab Results  Component Value Date   BUN 15 05/30/2019   CREATININE 0.88 05/30/2019   BCR 17 05/30/2019   GFRAA 115 05/30/2019   GFRNONAA 99 05/30/2019     Hepatic Lab Results  Component Value Date   AST 74 (H) 05/30/2019   ALT 66 (H) 05/30/2019   ALBUMIN 4.4 05/30/2019   ALKPHOS 131 (H) 05/30/2019     Electrolytes Lab Results  Component Value Date   NA 140 05/30/2019   K 4.8 05/30/2019   CL 102 05/30/2019   CALCIUM 9.6 05/30/2019     Bone No results found for: VD25OH, VD125OH2TOT, HC6237SE8, BT5176HY0, 25OHVITD1, 25OHVITD2, 25OHVITD3, TESTOFREE, TESTOSTERONE   Inflammation (CRP: Acute Phase) (ESR: Chronic Phase) No results found for: CRP, ESRSEDRATE, LATICACIDVEN     Note: Above Lab results reviewed.  Imaging  DG Neck Soft Tissue CLINICAL DATA:  Esophageal foreign body. Foreign body sensation after eating chicken. Difficulty swallowing.  EXAM: NECK SOFT TISSUES - 1+ VIEW  COMPARISON:  None.  FINDINGS: There is no evidence of retropharyngeal soft tissue swelling or epiglottic enlargement. The cervical airway is unremarkable. No subcutaneous emphysema. Soft tissues are non suspicious. No radio-opaque foreign body identified. Patient is edentulous.  IMPRESSION: Negative soft tissue neck radiographs. No radiopaque foreign body in the neck.  Electronically Signed   By: Keith Rake M.D.   On: 10/27/2018 00:37  Assessment  The primary encounter diagnosis was 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([place]). Diagnoses of Chronic pain syndrome, Phantom limb pain (HCC), Congenital neutropenia (HCC), Chronic pain of right lower extremity, and Amputation of right upper extremity, sequela ([place]) were also pertinent to this visit.  Plan of Care   Mr. STELLAN VICK has a current medication list which includes the following long-term medication(s): gabapentin,  gabapentin, loratadine, metoprolol succinate, nortriptyline, omeprazole, ventolin hfa, and losartan.  Pharmacotherapy (Medications Ordered): Meds ordered this encounter  Medications  . HYDROcodone-acetaminophen (NORCO) 10-325 MG tablet    Sig: Take 1-2 tablets by mouth every 12 (twelve) hours as needed for severe pain. Must last 30 days.    Dispense:  75 tablet    Refill:  0    [place] STOP ACT - Not applicable. Fill one day early if pharmacy is closed on scheduled refill date.  [place] HYDROcodone-acetaminophen (NORCO) 10-325 MG tablet    Sig: Take 1-2 tablets by mouth every 12 (twelve) hours as needed for severe pain. Must last 30 days.    Dispense:  75 tablet    Refill:  0    [place] STOP ACT - Not applicable. Fill one day early if pharmacy is closed on scheduled refill date.   Orders:  No orders of the defined types were placed in this encounter.  Follow-up plan:   Return in about 3 months (around 03/20/2020) for Medication Management, in person.     Recent Visits Date Type Provider Dept  09/26/19 Office Visit [name], MD [facility]  Showing recent visits within past 90 days and meeting all other requirements   Today's Visits Date Type Provider Dept  12/19/19 Telemedicine [name], MD [facility]  Showing today's visits and meeting all other requirements   Future Appointments No visits were found meeting these conditions.  Showing future appointments within next 90 days and meeting all other requirements   I discussed the assessment and treatment plan with the patient. The patient was provided an opportunity to ask questions and all were answered. The patient agreed with the plan and demonstrated an understanding of the instructions.  Patient advised to call back or seek an in-person evaluation if the symptoms or condition worsens.  Duration of encounter: 69mnutes.  Note by: BGillis Santa MD Date: 12/19/2019; Time: 12:01 PM

## 2019-12-23 ENCOUNTER — Other Ambulatory Visit: Payer: Self-pay

## 2019-12-23 ENCOUNTER — Ambulatory Visit (INDEPENDENT_AMBULATORY_CARE_PROVIDER_SITE_OTHER): Payer: Medicare Other | Admitting: Family Medicine

## 2019-12-23 ENCOUNTER — Encounter: Payer: Self-pay | Admitting: Family Medicine

## 2019-12-23 VITALS — BP 138/88 | HR 73 | Temp 98.3°F | Wt 198.0 lb

## 2019-12-23 DIAGNOSIS — I10 Essential (primary) hypertension: Secondary | ICD-10-CM | POA: Diagnosis not present

## 2019-12-23 DIAGNOSIS — G894 Chronic pain syndrome: Secondary | ICD-10-CM | POA: Diagnosis not present

## 2019-12-23 DIAGNOSIS — G546 Phantom limb syndrome with pain: Secondary | ICD-10-CM

## 2019-12-23 DIAGNOSIS — S88919A Complete traumatic amputation of unspecified lower leg, level unspecified, initial encounter: Secondary | ICD-10-CM

## 2019-12-23 DIAGNOSIS — S48911S Complete traumatic amputation of right shoulder and upper arm, level unspecified, sequela: Secondary | ICD-10-CM

## 2019-12-23 DIAGNOSIS — E782 Mixed hyperlipidemia: Secondary | ICD-10-CM

## 2019-12-23 DIAGNOSIS — K219 Gastro-esophageal reflux disease without esophagitis: Secondary | ICD-10-CM | POA: Diagnosis not present

## 2019-12-23 MED ORDER — NORTRIPTYLINE HCL 25 MG PO CAPS
25.0000 mg | ORAL_CAPSULE | Freq: Every day | ORAL | 0 refills | Status: DC
Start: 2019-12-23 — End: 2020-01-20

## 2019-12-23 NOTE — Progress Notes (Signed)
BP 138/88   Pulse 73   Temp 98.3 F (36.8 C) (Oral)   Wt 198 lb (89.8 kg)   SpO2 97%   BMI 30.11 kg/m    Subjective:    Patient ID: Brandon Myers, male    DOB: [birth year], 54 y.o.   MRN: [id number]  HPI: Brandon Myers is a 53 y.o. male  Chief Complaint  Patient presents with  . Hypertension   Presenting today for 6 month f/u chronic conditions.   HTN - not checking home BP readings. Taking medications faithfully without issue. Denies CP, SOB, HAs, dizziness.   On prilosec for GERD which does a good job controlling those sxs.   Under a lot of stress lately and gets anxious quite often. This has gotten to the point where it's affecting daily life and his relationships in a negative way. Currently taking low dose nortriptyline for neuropathic pain, has been on this for quite some time due to his traumatic amputations and phantom limb pain. Otherwise, has never been on anything for anxiety before.   Having some issues with his prosthetic leg (right) with the fit, has appt with [facility] tomorrow to discuss. Walking is incredibly difficult and painful because the prosthetic slides around due to poor fit and this is causing sores and creating infection risk. Is followed by pain mgmt for his chronic pain from these issues, currently on hydrocodone, gabapentin and nortriptyline with prn meloxicam. Under fairly good control.   Depression screen [facility] 2/9 11/11/2019 05/30/2019 10/09/2018  Decreased Interest 0 0 0  Down, Depressed, Hopeless 0 0 0  PHQ - 2 Score 0 0 0  Altered sleeping - 0 -  Tired, decreased energy - 1 -  Change in appetite - 0 -  Feeling bad or failure about yourself  - 0 -  Trouble concentrating - 0 -  Moving slowly or fidgety/restless - 0 -  Suicidal thoughts - 0 -  PHQ-9 Score - 1 -  No flowsheet data found.    Relevant past medical, surgical, family and social history reviewed and updated as indicated. Interim medical history since our last visit  reviewed. Allergies and medications reviewed and updated.  Review of Systems  Per HPI unless specifically indicated above     Objective:    BP 138/88   Pulse 73   Temp 98.3 F (36.8 C) (Oral)   Wt 198 lb (89.8 kg)   SpO2 97%   BMI 30.11 kg/m   Wt Readings from Last 3 Encounters:  12/23/19 198 lb (89.8 kg)  09/25/19 197 lb (89.4 kg)  09/25/19 197 lb 2 oz (89.4 kg)    Physical Exam Vitals and nursing note reviewed.  Constitutional:      Appearance: Normal appearance.  HENT:     Head: Atraumatic.  Eyes:     Extraocular Movements: Extraocular movements intact.     Conjunctiva/sclera: Conjunctivae normal.  Cardiovascular:     Rate and Rhythm: Normal rate and regular rhythm.  Pulmonary:     Effort: Pulmonary effort is normal.     Breath sounds: Normal breath sounds.  Musculoskeletal:     Cervical back: Normal range of motion and neck supple.     Comments: S/p R UE and LE amputations  Skin:    General: Skin is warm and dry.  Neurological:     General: No focal deficit present.     Mental Status: He is oriented to person, place, and time.  Psychiatric:  Mood and Affect: Mood normal.        Thought Content: Thought content normal.        Judgment: Judgment normal.     Results for orders placed or performed in visit on 12/23/19  Comprehensive metabolic panel  Result Value Ref Range   Glucose 89 65 - 99 mg/dL   BUN 13 6 - 24 mg/dL   Creatinine, Ser 0.86 0.76 - 1.27 mg/dL   GFR calc non Af Amer 100 >59 mL/min/1.73   GFR calc Af Amer 116 >59 mL/min/1.73   BUN/Creatinine Ratio 15 9 - 20   Sodium 139 134 - 144 mmol/L   Potassium 4.6 3.5 - 5.2 mmol/L   Chloride 99 96 - 106 mmol/L   CO2 24 20 - 29 mmol/L   Calcium 9.4 8.7 - 10.2 mg/dL   Total Protein 7.3 6.0 - 8.5 g/dL   Albumin 4.6 3.8 - 4.9 g/dL   Globulin, Total 2.7 1.5 - 4.5 g/dL   Albumin/Globulin Ratio 1.7 1.2 - 2.2   Bilirubin Total 0.4 0.0 - 1.2 mg/dL   Alkaline Phosphatase 141 (H) 48 - 121 IU/L    AST 67 (H) 0 - 40 IU/L   ALT 69 (H) 0 - 44 IU/L  Lipid Panel w/o Chol/HDL Ratio  Result Value Ref Range   Cholesterol, Total 139 100 - 199 mg/dL   Triglycerides 231 (H) 0 - 149 mg/dL   HDL 23 (L) >39 mg/dL   VLDL Cholesterol Cal 39 5 - 40 mg/dL   LDL Chol Calc (NIH) 77 0 - 99 mg/dL      Assessment & Plan:   Problem List Items Addressed This Visit      Cardiovascular and Mediastinum   Essential hypertension - Primary    Stable and WNL, continue current regimen and start logging home readings      Relevant Orders   Comprehensive metabolic panel (Completed)     Digestive   Esophageal reflux    Stable and well controlled, continue current regimen        Nervous and Auditory   Phantom limb pain (HCC)    Followed by Pain Clinic, continue per their recommendations      Relevant Medications   nortriptyline (PAMELOR) 25 MG capsule     Other   Amputation of arm, right (HCC)   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(HCC)   Hyperlipidemia    Recheck lipids, adjust if needed. Lifestyle changes reviewed      Relevant Orders   Lipid Panel w/o Chol/HDL Ratio (Completed)   Chronic pain syndrome   Relevant Medications   nortriptyline (PAMELOR) 25 MG capsule       S/p right transfemoral amputation: - The patient doesn't have comorbidities that will impact his mobility or ability to function with a prosthesis - He currently has an ill-fitting prosthesis due to weight loss and limb reshaping - His prosthesis has been falling off and has caused the patient to fall and he would benefit from a new tighter fitting socket to improve suspension - Patient verbalizes a strong desire to get a new prosthetic socket - He is not currently using mobility aides and is not expected to do so with a new prosthetic socket - Patient is a K3 level ambulator that spends a lot of time walking around on uneven terrain over obstacles, up and down stairs, and sometimes has to walk backwards   Follow up plan: Return in  about 4 weeks (around 01/20/2020) for Anxiety f/u.

## 2019-12-24 ENCOUNTER — Telehealth: Payer: Self-pay | Admitting: Family Medicine

## 2019-12-24 LAB — COMPREHENSIVE METABOLIC PANEL
ALT: 69 IU/L — ABNORMAL HIGH (ref 0–44)
AST: 67 IU/L — ABNORMAL HIGH (ref 0–40)
Albumin/Globulin Ratio: 1.7 (ref 1.2–2.2)
Albumin: 4.6 g/dL (ref 3.8–4.9)
Alkaline Phosphatase: 141 IU/L — ABNORMAL HIGH (ref 48–121)
BUN/Creatinine Ratio: 15 (ref 9–20)
BUN: 13 mg/dL (ref 6–24)
Bilirubin Total: 0.4 mg/dL (ref 0.0–1.2)
CO2: 24 mmol/L (ref 20–29)
Calcium: 9.4 mg/dL (ref 8.7–10.2)
Chloride: 99 mmol/L (ref 96–106)
Creatinine, Ser: 0.86 mg/dL (ref 0.76–1.27)
GFR calc Af Amer: 116 mL/min/{1.73_m2} (ref >59)
GFR calc non Af Amer: 100 mL/min/{1.73_m2} (ref >59)
Globulin, Total: 2.7 g/dL (ref 1.5–4.5)
Glucose: 89 mg/dL (ref 65–99)
Potassium: 4.6 mmol/L (ref 3.5–5.2)
Sodium: 139 mmol/L (ref 134–144)
Total Protein: 7.3 g/dL (ref 6.0–8.5)

## 2019-12-24 LAB — LIPID PANEL W/O CHOL/HDL RATIO
Cholesterol, Total: 139 mg/dL (ref 100–199)
HDL: 23 mg/dL — ABNORMAL LOW (ref >39)
LDL Chol Calc (NIH): 77 mg/dL (ref 0–99)
Triglycerides: 231 mg/dL — ABNORMAL HIGH (ref 0–149)
VLDL Cholesterol Cal: 39 mg/dL (ref 5–40)

## 2019-12-24 NOTE — Telephone Encounter (Signed)
Pt's paper was dropped off to be signed in order for him to get his prosthetics .Paper left in bin to be signed and reviewed. Pt would like a call when done.

## 2019-12-25 NOTE — Telephone Encounter (Signed)
Paper in Rachel's folder for signature

## 2019-12-25 NOTE — Telephone Encounter (Signed)
FYI

## 2019-12-27 ENCOUNTER — Telehealth: Payer: Self-pay

## 2019-12-27 NOTE — Telephone Encounter (Signed)
Completed.

## 2019-12-27 NOTE — Assessment & Plan Note (Signed)
Stable and well controlled, continue current regimen 

## 2019-12-27 NOTE — Assessment & Plan Note (Signed)
Followed by Pain Clinic, continue per their recommendations 

## 2019-12-27 NOTE — Assessment & Plan Note (Signed)
Recheck lipids, adjust if needed. Lifestyle changes reviewed

## 2019-12-27 NOTE — Assessment & Plan Note (Signed)
Stable and WNL, continue current regimen and start logging home readings

## 2019-12-27 NOTE — Telephone Encounter (Signed)
Called patient to let him know that prosthetic paperwork is ready. Faxed paperwork over to [facility] in [place].

## 2019-12-30 ENCOUNTER — Encounter: Payer: Self-pay | Admitting: Family Medicine

## 2020-01-19 ENCOUNTER — Other Ambulatory Visit: Payer: Self-pay | Admitting: Family Medicine

## 2020-01-19 DIAGNOSIS — J301 Allergic rhinitis due to pollen: Secondary | ICD-10-CM

## 2020-01-20 ENCOUNTER — Other Ambulatory Visit: Payer: Self-pay

## 2020-01-20 ENCOUNTER — Encounter: Payer: Self-pay | Admitting: Family Medicine

## 2020-01-20 ENCOUNTER — Ambulatory Visit (INDEPENDENT_AMBULATORY_CARE_PROVIDER_SITE_OTHER): Payer: Medicare Other | Admitting: Family Medicine

## 2020-01-20 DIAGNOSIS — F419 Anxiety disorder, unspecified: Secondary | ICD-10-CM

## 2020-01-20 MED ORDER — HYDROXYZINE HCL 10 MG PO TABS
10.0000 mg | ORAL_TABLET | Freq: Three times a day (TID) | ORAL | 0 refills | Status: DC | PRN
Start: 2020-01-20 — End: 2020-06-17

## 2020-01-20 MED ORDER — NORTRIPTYLINE HCL 25 MG PO CAPS: 25.0000 mg | ORAL_CAPSULE | Freq: Every day | ORAL | 1 refills | Status: DC

## 2020-01-20 NOTE — Progress Notes (Signed)
BP (!) 141/92   Pulse 73   Temp 99 F (37.2 C) (Oral)   Wt 198 lb (89.8 kg)   SpO2 96%   BMI 30.11 kg/m    Subjective:    Patient ID: Brandon Myers, male    DOB: [birth year], 54 y.o.   MRN: [id number]  HPI: Brandon Myers is a 54 y.o. male  Chief Complaint  Patient presents with  . Anxiety   Here today for 1 month anxiety f/u. Does feel the increase in nortriptyline has improved his anxiety sxs, but still having breakthrough issues here and there. Denies CP, SOB, hyperventilation, sleep issues. Has not been to counseling in a very long time but open to it.   Depression screen [facility] 2/9 01/20/2020 11/11/2019 05/30/2019  Decreased Interest 0 0 0  Down, Depressed, Hopeless 1 0 0  PHQ - 2 Score 1 0 0  Altered sleeping 0 - 0  Tired, decreased energy 1 - 1  Change in appetite 0 - 0  Feeling bad or failure about yourself  0 - 0  Trouble concentrating 0 - 0  Moving slowly or fidgety/restless 0 - 0  Suicidal thoughts 0 - 0  PHQ-9 Score 2 - 1   GAD 7 : Generalized Anxiety Score 01/20/2020  Nervous, Anxious, on Edge 1  Control/stop worrying 0  Worry too much - different things 1  Trouble relaxing 0  Restless 0  Easily annoyed or irritable 0  Afraid - awful might happen 1  Total GAD 7 Score 3  Anxiety Difficulty Not difficult at all   Relevant past medical, surgical, family and social history reviewed and updated as indicated. Interim medical history since our last visit reviewed. Allergies and medications reviewed and updated.  Review of Systems  Per HPI unless specifically indicated above     Objective:    BP (!) 141/92   Pulse 73   Temp 99 F (37.2 C) (Oral)   Wt 198 lb (89.8 kg)   SpO2 96%   BMI 30.11 kg/m   Wt Readings from Last 3 Encounters:  01/20/20 198 lb (89.8 kg)  12/23/19 198 lb (89.8 kg)  09/25/19 197 lb (89.4 kg)    Physical Exam Vitals and nursing note reviewed.  Constitutional:      Appearance: Normal appearance.  HENT:     Head: Atraumatic.    Eyes:     Extraocular Movements: Extraocular movements intact.     Conjunctiva/sclera: Conjunctivae normal.  Cardiovascular:     Rate and Rhythm: Normal rate and regular rhythm.  Pulmonary:     Effort: Pulmonary effort is normal.     Breath sounds: Normal breath sounds.  Musculoskeletal:     Cervical back: Normal range of motion and neck supple.     Comments: ROM at baseline  Skin:    General: Skin is warm and dry.  Neurological:     General: No focal deficit present.     Mental Status: He is oriented to person, place, and time.  Psychiatric:        Mood and Affect: Mood normal.        Thought Content: Thought content normal.        Judgment: Judgment normal.     Results for orders placed or performed in visit on 12/23/19  Comprehensive metabolic panel  Result Value Ref Range   Glucose 89 65 - 99 mg/dL   BUN 13 6 - 24 mg/dL   Creatinine, Ser 4.16 0.76 - 1.27  mg/dL   GFR calc non Af Amer 100 >59 mL/min/1.73   GFR calc Af Amer 116 >59 mL/min/1.73   BUN/Creatinine Ratio 15 9 - 20   Sodium 139 134 - 144 mmol/L   Potassium 4.6 3.5 - 5.2 mmol/L   Chloride 99 96 - 106 mmol/L   CO2 24 20 - 29 mmol/L   Calcium 9.4 8.7 - 10.2 mg/dL   Total Protein 7.3 6.0 - 8.5 g/dL   Albumin 4.6 3.8 - 4.9 g/dL   Globulin, Total 2.7 1.5 - 4.5 g/dL   Albumin/Globulin Ratio 1.7 1.2 - 2.2   Bilirubin Total 0.4 0.0 - 1.2 mg/dL   Alkaline Phosphatase 141 (H) 48 - 121 IU/L   AST 67 (H) 0 - 40 IU/L   ALT 69 (H) 0 - 44 IU/L  Lipid Panel w/o Chol/HDL Ratio  Result Value Ref Range   Cholesterol, Total 139 100 - 199 mg/dL   Triglycerides 231 (H) 0 - 149 mg/dL   HDL 23 (L) >39 mg/dL   VLDL Cholesterol Cal 39 5 - 40 mg/dL   LDL Chol Calc (NIH) 77 0 - 99 mg/dL      Assessment & Plan:   Problem List Items Addressed This Visit      Other   Anxiety    Overall improved with increased nortriptyline dose, add hydroxyzine prn and list of local counselors given per pt request      Relevant  Medications   hydrOXYzine (ATARAX/VISTARIL) 10 MG tablet   nortriptyline (PAMELOR) 25 MG capsule       Follow up plan: Return in about 6 months (around 07/21/2020) for 6 month f/u.

## 2020-01-20 NOTE — Patient Instructions (Signed)
[employer] Today - database to help find counselors in the area

## 2020-01-21 ENCOUNTER — Encounter: Payer: Self-pay | Admitting: Family Medicine

## 2020-01-21 DIAGNOSIS — F419 Anxiety disorder, unspecified: Secondary | ICD-10-CM | POA: Insufficient documentation

## 2020-01-21 HISTORY — DX: Anxiety disorder, unspecified: F41.9

## 2020-01-21 NOTE — Assessment & Plan Note (Signed)
Overall improved with increased nortriptyline dose, add hydroxyzine prn and list of local counselors given per pt request

## 2020-02-09 ENCOUNTER — Other Ambulatory Visit: Payer: Self-pay | Admitting: Family Medicine

## 2020-02-09 NOTE — Telephone Encounter (Signed)
Requested Prescriptions  Pending Prescriptions Disp Refills  . metoprolol succinate (TOPROL-XL) 100 MG 24 hr tablet [Pharmacy Med Name: METOPROLOL ER SUCCINATE 100MG  TABS] 90 tablet 1    Sig: TAKE 1 TABLET BY MOUTH EVERY DAY WITH OR IMMEDIATELY FOLLOWING A MEAL     Cardiovascular:  Beta Blockers Failed - 02/09/2020 10:07 AM      Failed - Last BP in normal range    BP Readings from Last 1 Encounters:  01/20/20 (!) 141/92         Passed - Last Heart Rate in normal range    Pulse Readings from Last 1 Encounters:  01/20/20 73         Passed - Valid encounter within last 6 months    Recent Outpatient Visits          2 weeks ago Anxiety   [facility], [place]   1 month ago Essential hypertension   [facility] [place], [place]   4 months ago Acute non-recurrent maxillary sinusitis   [facility] [place], [place]   4 months ago Acute non-recurrent maxillary sinusitis   [facility] [place], [place]   8 months ago Essential hypertension   [facility] [place], [place]

## 2020-03-01 ENCOUNTER — Other Ambulatory Visit: Payer: Self-pay | Admitting: Family Medicine

## 2020-03-01 ENCOUNTER — Encounter: Payer: Self-pay | Admitting: Family Medicine

## 2020-03-05 ENCOUNTER — Ambulatory Visit (INDEPENDENT_AMBULATORY_CARE_PROVIDER_SITE_OTHER): Payer: Medicare Other | Admitting: Family Medicine

## 2020-03-05 ENCOUNTER — Other Ambulatory Visit: Payer: Self-pay

## 2020-03-05 ENCOUNTER — Encounter: Payer: Self-pay | Admitting: Family Medicine

## 2020-03-05 VITALS — BP 137/85 | HR 79 | Temp 98.8°F | Wt 202.0 lb

## 2020-03-05 DIAGNOSIS — S39012A Strain of muscle, fascia and tendon of lower back, initial encounter: Secondary | ICD-10-CM

## 2020-03-05 MED ORDER — CYCLOBENZAPRINE HCL 10 MG PO TABS
10.0000 mg | ORAL_TABLET | Freq: Three times a day (TID) | ORAL | 0 refills | Status: DC | PRN
Start: 2020-03-05 — End: 2020-06-17

## 2020-03-05 NOTE — Progress Notes (Signed)
BP 137/85   Pulse 79   Temp 98.8 F (37.1 C) (Oral)   Wt 202 lb (91.6 kg)   SpO2 97%   BMI 30.71 kg/m    Subjective:    Patient ID: Brandon Myers, male    DOB: 29-Mar-1966, 54 y.o.   MRN: [id number]  HPI: Brandon Myers is a 54 y.o. male  Chief Complaint  Patient presents with  . Back Pain    lower back x 3 days   Right lower back pain the past 3 days. Feels a twinge in back when he moves a certain way. Able to improve with sitting still, worst with movement or bending. Thinks he may have pulled a muscle or something. Denies radiation down legs, numbness or tingling above baseline, fevers, bowel or bladder incontinence. On chronic meloxicam and trying some motrin and BC powder which has only provided temporary relief. Followed by [facility] for chronic neuropathic pain and phantom limb pain, on hydrocodone regimen for that. Has also tried to stretch some but has not noticed much benefit.   Relevant past medical, surgical, family and social history reviewed and updated as indicated. Interim medical history since our last visit reviewed. Allergies and medications reviewed and updated.  Review of Systems  Per HPI unless specifically indicated above     Objective:    BP 137/85   Pulse 79   Temp 98.8 F (37.1 C) (Oral)   Wt 202 lb (91.6 kg)   SpO2 97%   BMI 30.71 kg/m   Wt Readings from Last 3 Encounters:  03/05/20 202 lb (91.6 kg)  01/20/20 198 lb (89.8 kg)  12/23/19 198 lb (89.8 kg)    Physical Exam Vitals and nursing note reviewed.  Constitutional:      Appearance: Normal appearance.  HENT:     Head: Atraumatic.  Eyes:     Extraocular Movements: Extraocular movements intact.     Conjunctiva/sclera: Conjunctivae normal.  Cardiovascular:     Rate and Rhythm: Normal rate and regular rhythm.  Pulmonary:     Effort: Pulmonary effort is normal.     Breath sounds: Normal breath sounds.  Musculoskeletal:        General: Tenderness (mild ttp right lumbar paraspinal  muscles) present. No deformity.     Cervical back: Normal range of motion and neck supple.     Comments: Good ROM, at baseline though forward extension at hips and twisting of his core eliciting pain per patient  Skin:    General: Skin is warm and dry.  Neurological:     General: No focal deficit present.     Mental Status: He is oriented to person, place, and time.  Psychiatric:        Mood and Affect: Mood normal.        Thought Content: Thought content normal.        Judgment: Judgment normal.     Results for orders placed or performed in visit on 12/23/19  Comprehensive metabolic panel  Result Value Ref Range   Glucose 89 65 - 99 mg/dL   BUN 13 6 - 24 mg/dL   Creatinine, Ser 5.63 0.76 - 1.27 mg/dL   GFR calc non Af Amer 100 >59 mL/min/1.73   GFR calc Af Amer 116 >59 mL/min/1.73   BUN/Creatinine Ratio 15 9 - 20   Sodium 139 134 - 144 mmol/L   Potassium 4.6 3.5 - 5.2 mmol/L   Chloride 99 96 - 106 mmol/L   CO2 24  20 - 29 mmol/L   Calcium 9.4 8.7 - 10.2 mg/dL   Total Protein 7.3 6.0 - 8.5 g/dL   Albumin 4.6 3.8 - 4.9 g/dL   Globulin, Total 2.7 1.5 - 4.5 g/dL   Albumin/Globulin Ratio 1.7 1.2 - 2.2   Bilirubin Total 0.4 0.0 - 1.2 mg/dL   Alkaline Phosphatase 141 (H) 48 - 121 IU/L   AST 67 (H) 0 - 40 IU/L   ALT 69 (H) 0 - 44 IU/L  Lipid Panel w/o Chol/HDL Ratio  Result Value Ref Range   Cholesterol, Total 139 100 - 199 mg/dL   Triglycerides 161 (H) 0 - 149 mg/dL   HDL 23 (L) >09 mg/dL   VLDL Cholesterol Cal 39 5 - 40 mg/dL   LDL Chol Calc (NIH) 77 0 - 99 mg/dL      Assessment & Plan:   Problem List Items Addressed This Visit    None    Visit Diagnoses    Strain of lumbar region, initial encounter    -  Primary   Already on pain regimen, add flexeril, lidocaine patches, heat, massage, stretches. F/u if worsening or not improving       Follow up plan: Return for as scheduled.

## 2020-03-17 ENCOUNTER — Other Ambulatory Visit: Payer: Self-pay | Admitting: Family Medicine

## 2020-03-17 DIAGNOSIS — G546 Phantom limb syndrome with pain: Secondary | ICD-10-CM

## 2020-03-19 ENCOUNTER — Ambulatory Visit
Payer: Medicare Other | Attending: Student in an Organized Health Care Education/Training Program | Admitting: Student in an Organized Health Care Education/Training Program

## 2020-03-19 ENCOUNTER — Encounter: Payer: Self-pay | Admitting: Student in an Organized Health Care Education/Training Program

## 2020-03-19 ENCOUNTER — Other Ambulatory Visit: Payer: Self-pay

## 2020-03-19 VITALS — BP 127/82 | HR 85 | Temp 96.9°F | Resp 16 | Ht 68.0 in | Wt 202.0 lb

## 2020-03-19 DIAGNOSIS — S48911S Complete traumatic amputation of right shoulder and upper arm, level unspecified, sequela: Secondary | ICD-10-CM

## 2020-03-19 DIAGNOSIS — M545 Low back pain, unspecified: Secondary | ICD-10-CM | POA: Insufficient documentation

## 2020-03-19 DIAGNOSIS — G894 Chronic pain syndrome: Secondary | ICD-10-CM | POA: Diagnosis present

## 2020-03-19 DIAGNOSIS — G546 Phantom limb syndrome with pain: Secondary | ICD-10-CM

## 2020-03-19 DIAGNOSIS — M79604 Pain in right leg: Secondary | ICD-10-CM

## 2020-03-19 DIAGNOSIS — S88919A Complete traumatic amputation of unspecified lower leg, level unspecified, initial encounter: Secondary | ICD-10-CM | POA: Diagnosis not present

## 2020-03-19 DIAGNOSIS — G8929 Other chronic pain: Secondary | ICD-10-CM

## 2020-03-19 DIAGNOSIS — D700 Congenital agranulocytosis: Secondary | ICD-10-CM | POA: Diagnosis not present

## 2020-03-19 MED ORDER — HYDROCODONE-ACETAMINOPHEN 10-325 MG PO TABS: 1.0000 | ORAL_TABLET | Freq: Two times a day (BID) | ORAL | 0 refills | Status: AC | PRN

## 2020-03-19 MED ORDER — HYDROCODONE-ACETAMINOPHEN 10-325 MG PO TABS: 1.0000 | ORAL_TABLET | Freq: Two times a day (BID) | ORAL | 0 refills | Status: DC | PRN

## 2020-03-19 NOTE — Progress Notes (Signed)
Nursing Pain Medication Assessment:  Safety precautions to be maintained throughout the outpatient stay will include: orient to surroundings, keep bed in low position, maintain call bell within reach at all times, provide assistance with transfer out of bed and ambulation.  Medication Inspection Compliance: Pill count conducted under aseptic conditions, in front of the patient. Neither the pills nor the bottle was removed from the patient's sight at any time. Once count was completed pills were immediately returned to the patient in their original bottle.  Medication: Hydrocodone/APAP Pill/Patch Count: 17 of 75 pills remain Pill/Patch Appearance: Markings consistent with prescribed medication Bottle Appearance: Standard pharmacy container. Clearly labeled. Filled Date: 02/16/2020 Last Medication intake:  Burgess Estelle

## 2020-03-19 NOTE — Progress Notes (Signed)
PROVIDER NOTE: Information contained herein reflects review and annotations entered in association with encounter. Interpretation of such information and data should be left to medically-trained personnel. Information provided to patient can be located elsewhere in the medical record under "Patient Instructions". Document created using STT-dictation technology, any transcriptional errors that may result from process are unintentional.    Patient: Brandon Myers  Service Category: E/M  Provider: [name], MD  DOB: [birth year]  DOS: 03/19/2020  Specialty: Interventional Pain Management  MRN: [id number]  Setting: Ambulatory outpatient  PCP: Brandon American, PA-C  Type: Established Patient    Referring Provider: Volney Myers,*  Location: Office  Delivery: Face-to-face     HPI  Reason for encounter: Brandon Myers, a 54 y.o. year old male, is here today for evaluation and management of his 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(Brandon Myers) [S88.919A]. Brandon Myers primary complain today is Leg Pain (right) Last encounter: Practice (12/18/2019). My last encounter with him was on 09/26/2019. Pertinent problems: Brandon Myers has Congenital agranulocytosis (Brandon Myers); Congenital neutropenia (Brandon Myers); Amputation of right arm (Brandon Myers); 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(Brandon Myers); Phantom limb pain (Brandon Myers); Chronic pain of right lower extremity; Chronic pain syndrome; and Acute right-sided low back pain without sciatica on their pertinent problem list. Pain Assessment: Severity of Chronic pain, Phantom pain is reported as a 5 /10. Location: Leg Right/denies. Onset: More than a month ago. Quality: Aching, Dull. Timing: Constant. Modifying factor(s): medicaitons. Vitals:  height is _0  (1.727 m) and weight is 202 lb (91.6 kg). His temporal temperature is 96.9 F (36.1 C) (abnormal). His blood pressure is 127/82 and his pulse is 85. His respiration is 16 and oxygen saturation is 98%.   Brandon Myers follows up today for medication management.  No significant changes  in his medical history since his last visit with me which was on 12/19/2019.  Of note, he has been experiencing increased low back pain in his right lumbar region.  He did see his primary care provider, Brandon Myers for this and was prescribed Flexeril to take as needed for his lumbar paraspinal muscle spasms.  He states that he can only take the medication at night as it makes him very sleepy.  He continues to utilize meloxicam as needed.  Continues gabapentin as prescribed.  We will refill his hydrocodone as below.  No change in dose.  I have also ordered an x-ray of his lumbar spine in the event that his low back pain does not improve.  Pharmacotherapy Assessment   02/18/2020  1   12/19/2019  Hydrocodone-Acetamin 10-325 MG  75.00  30 Bi Lat   [id number]   [facility] (4010)   0/0  25.00 MME  Medicare   [place]      Monitoring: [place] PMP: PDMP reviewed during this encounter.       Pharmacotherapy: No side-effects or adverse reactions reported. Compliance: No problems identified. Effectiveness: Clinically acceptable.  Brandon Martins, RN  03/19/2020  1:43 PM  Sign when Signing Visit Nursing Pain Medication Assessment:  Safety precautions to be maintained throughout the outpatient stay will include: orient to surroundings, keep bed in low position, maintain call bell within reach at all times, provide assistance with transfer out of bed and ambulation.  Medication Inspection Compliance: Pill count conducted under aseptic conditions, in front of the patient. Neither the pills nor the bottle was removed from the patient's sight at any time. Once count was completed pills were immediately returned to the patient in their original bottle.  Medication: Hydrocodone/APAP  Pill/Patch Count: 17 of 75 pills remain Pill/Patch Appearance: Markings consistent with prescribed medication Bottle Appearance: Standard pharmacy container. Clearly labeled. Filled Date: 02/16/2020 Last Medication intake:  Yesterday    UDS:   Summary  Date Value Ref Range Status  04/11/2019 Note  Final    Comment:    ==================================================================== Tox[employer] Select 13 (MW) ==================================================================== Test                             Result       Flag       Units Drug Present and Declared for Prescription Verification   Hydrocodone                    716          EXPECTED   ng/mg creat   Dihydrocodeine                 224          EXPECTED   ng/mg creat   Norhydrocodone                 1202         EXPECTED   ng/mg creat    Sources of hydrocodone include scheduled prescription medications.    Dihydrocodeine and norhydrocodone are expected metabolites of    hydrocodone. Dihydrocodeine is also available as a scheduled    prescription medication. ==================================================================== Test                      Result    Flag   Units      Ref Range   Creatinine              45               mg/dL      >=20 ==================================================================== Declared Medications:  The flagging and interpretation on this report are based on the  following declared medications.  Unexpected results may arise from  inaccuracies in the declared medications.  **Note: The testing scope of this panel includes these medications:  Hydrocodone (Norco)  **Note: The testing scope of this panel does not include the  following reported medications:  Acetaminophen (Norco)  Albuterol  Fluticasone (Flonase)  Gabapentin (Neurontin)  Loratadine (Claritin)  Losartan (Cozaar)  Melatonin  Meloxicam (Mobic)  Metoprolol (Toprol)  Nortriptyline (Pamelor)  Omeprazole (Prilosec) ==================================================================== For clinical consultation, please call [phone]. ====================================================================      ROS  Constitutional: Denies any fever or  chills Gastrointestinal: No reported hemesis, hematochezia, vomiting, or acute GI distress Musculoskeletal: Denies any acute onset joint swelling, redness, loss of ROM, or weakness Neurological: No reported episodes of acute onset apraxia, aphasia, dysarthria, agnosia, amnesia, paralysis, loss of coordination, or loss of consciousness  Medication Review  HYDROcodone-acetaminophen, Melatonin, albuterol, cyclobenzaprine, filgrastim-sndz, gabapentin, hydrOXYzine, loratadine, losartan, meloxicam, metoprolol succinate, nortriptyline, and omeprazole  History Review  Allergy: Brandon Myers is allergic to enalapril, hydrochlorothiazide, prochlorperazine, sulfa antibiotics, benadryl [diphenhydramine], and strawberry extract. Drug: Brandon Myers  reports no history of drug use. Alcohol:  reports no history of alcohol use. Tobacco:  reports that he has never smoked. He has never used smokeless tobacco. Social: Brandon Myers  reports that he has never smoked. He has never used smokeless tobacco. He reports that he does not drink alcohol and does not use drugs. Medical:  has a past medical history of Allergy, Anxiety (01/21/2020), Controlled substance agreement signed (  04/02/2017), GERD (gastroesophageal reflux disease), Hypertension, Neutropenia, congenital ([place]), and Phantom limb pain ([place]). Surgical: Brandon Myers  has a past surgical history that includes Amputation; Appendectomy; Colon surgery (1994); Above elbow arm amputation (Right, 1993); and Leg amputation through knee (Right, 1993). Family: family history includes Alzheimer's disease in his mother; Asthma in his mother; Cancer in his father, maternal grandmother, paternal grandfather, and paternal grandmother; Depression in his mother; Heart Problems in his mother; Hypertension in his father and mother; Stroke in his maternal grandmother; Thyroid disease in his mother.  Laboratory Chemistry Profile   Renal Lab Results  Component Value Date   BUN 13 12/23/2019    CREATININE 0.86 12/23/2019   BCR 15 12/23/2019   GFRAA 116 12/23/2019   GFRNONAA 100 12/23/2019     Hepatic Lab Results  Component Value Date   AST 67 (H) 12/23/2019   ALT 69 (H) 12/23/2019   ALBUMIN 4.6 12/23/2019   ALKPHOS 141 (H) 12/23/2019     Electrolytes Lab Results  Component Value Date   NA 139 12/23/2019   K 4.6 12/23/2019   CL 99 12/23/2019   CALCIUM 9.4 12/23/2019     Bone No results found for: VD25OH, VD125OH2TOT, JS3159YV8, PF2924MQ2, 25OHVITD1, 25OHVITD2, 25OHVITD3, TESTOFREE, TESTOSTERONE   Inflammation (CRP: Acute Phase) (ESR: Chronic Phase) No results found for: CRP, ESRSEDRATE, LATICACIDVEN     Note: Above Lab results reviewed.  Recent Imaging Review  DG Neck Soft Tissue CLINICAL DATA:  Esophageal foreign body. Foreign body sensation after eating chicken. Difficulty swallowing.  EXAM: NECK SOFT TISSUES - 1+ VIEW  COMPARISON:  None.  FINDINGS: There is no evidence of retropharyngeal soft tissue swelling or epiglottic enlargement. The cervical airway is unremarkable. No subcutaneous emphysema. Soft tissues are non suspicious. No radio-opaque foreign body identified. Patient is edentulous.  IMPRESSION: Negative soft tissue neck radiographs. No radiopaque foreign body in the neck.  Electronically Signed   By: Keith Rake M.D.   On: 10/27/2018 00:37 Note: Reviewed        Physical Exam  General appearance: Well nourished, well developed, and well hydrated. In no apparent acute distress Mental status: Alert, oriented x 3 (person, place, & time)       Respiratory: No evidence of acute respiratory distress Eyes: PERLA Vitals: BP 127/82   Pulse 85   Temp (!) 96.9 F (36.1 C) (Temporal)   Resp 16   Ht _0  (1.727 m)   Wt 202 lb (91.6 kg)   SpO2 98%   BMI 30.71 kg/m  BMI: Estimated body mass index is 30.71 kg/m as calculated from the following:   Height as of this encounter: _1  (1.727 m).   Weight as of this encounter: 202 lb  (91.6 kg). Ideal: Ideal body weight: 68.4 kg (150 lb 12.7 oz) Adjusted ideal body weight: 77.7 kg (171 lb 4.4 oz)   Lumbar Spine Area Exam  Skin & Axial Inspection:No masses, redness, or swelling Alignment:Symmetrical Functional MMN:[id number] ROM Stability:No instability detected Muscle Tone/Strength:Functionally intact. No obvious neuro-muscular anomalies detected. Sensory (Neurological): Low back pain, musculoskeletal, right greater than left, L4/L5 region. Palpation:No palpable anomalies Provocative Tests: Lumbar Hyperextension/rotation test:deferred today Lumbar quadrant test (Kemp's test):deferred today Lumbar Lateral bending test:deferred today Patrick's Maneuver:deferred today FABER test:deferred today Thigh-thrust test:deferred today S-I compression test:deferred today S-I distraction test:deferred today  Gait & Posture Assessment  Ambulation:Limited, prosthesis in place Gait:Antalgic gait (limping) Posture:Difficulty with positional changes  Lower Extremity Exam    Side:Right lower extremity  Side:Left lower extremity  Stability:No instability observed  Stability:No instability observed  Skin & Extremity Inspection:Above knee amputation (AKA)  Skin & Extremity Inspection:Skin color, temperature, and hair growth are WNL. No peripheral edema or cyanosis. No masses, redness, swelling, asymmetry, or associated skin lesions. No contractures.  Functional DEY:CXKGYJEHU ROM   Functional DJS:HFWYOVZCHYIF ROM   Muscle Tone/Strength:Functionally intact. No obvious neuro-muscular anomalies detected.  Muscle Tone/Strength:Functionally intact. No obvious neuro-muscular anomalies detected.  Sensory (Neurological):Neuropathic pain pattern  Sensory (Neurological):Unimpaired  Palpation:No palpable anomalies  Palpation:No palpable anomalies      Assessment   Status Diagnosis  Controlled Controlled Controlled 1. 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([place])   2. Chronic pain syndrome   3. Phantom limb pain ([place])   4. Congenital neutropenia (H[facility])   5. Chronic pain of right lower extremity   6. Amputation of right upper extremity, sequela ([place])   7. 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  Pharmacotherapy (Medications Ordered): Meds ordered this encounter  Medications  . HYDROcodone-acetaminophen (NORCO) 10-325 MG tablet    Sig: Take 1-2 tablets by mouth every 12 (twelve) hours as needed for severe pain. Must last 30 days.    Dispense:  75 tablet    Refill:  0    [place] STOP ACT - Not applicable. Fill one day early if pharmacy is closed on scheduled refill date.  [place] HYDROcodone-acetaminophen (NORCO) 10-325 MG tablet    Sig: Take 1-2 tablets by mouth every 12 (twelve) hours as needed for severe pain. Must last 30 days.    Dispense:  75 tablet    Refill:  0    [place] STOP ACT - Not applicable. Fill one day early if pharmacy is closed on scheduled refill date.  Marland Kitchen HYDROcodone-acetaminophen (NORCO) 10-325 MG tablet    Sig: Take 1-2 tablets by mouth every 12 (twelve) hours as needed for severe pain. Must last 30 days.    Dispense:  75 tablet    Refill:  0    [place] STOP ACT - Not applicable. Fill one day early if pharmacy is closed on scheduled refill date.    Orders:  Orders Placed This Encounter  Procedures  . DG Lumbar Spine Complete W/Bend    Patient presents with axial pain with possible radicular component.  In addition to any acute findings, please report on:  1. Facet (Zygapophyseal) joint DJD (Hypertrophy, space narrowing, subchondral sclerosis, and/or osteophyte formation) 2. DDD and/or IVDD (Loss of disc height, desiccation or "Black disc disease") 3. Pars defects 4. Spondylolisthesis, spondylosis, and/or spondyloarthropathies (include Degree/Grade of displacement in mm) 5. Vertebral body Fractures, including age (old, new/acute) 81. Modic Type Changes 7. Demineralization 8. Bone pathology 9. Central, Lateral Recess, and/or Foraminal Stenosis (include AP diameter of stenosis in mm) 10. Surgical changes (hardware type, status, and presence of fibrosis)  NOTE: Please specify level(s) and laterality. If applicable: Please indicate ROM and/or evidence of instability (>14m displacement between flexion and extension views)    Standing  Status:   Future    Standing Expiration Date:   06/19/2020    Order Specific Question:   Reason for Exam (SYMPTOM  OR DIAGNOSIS REQUIRED)    Answer:   Low back pain    Order Specific Question:   Preferred imaging location?    Answer:   [facility]    Order Specific Question:   Call Results- Best Contact Number?    Answer:   [phone] ([facility])    Order Specific Question:   Radiology Contrast Protocol - do NOT remove file path    Answer:   [email or web address]  . ToxASSURE Select 13 (MW), Urine    Volume: 30 ml(s). Minimum 3 ml of urine is needed. Document temperature of fresh sample. Indications: Long term (current) use of opiate analgesic [phone])    Order Specific Question:   Release to patient    Answer:   Immediate   Follow-up plan:   Return in about 3 months (around 06/19/2020) for Medication Management, in person.   Recent  Visits No visits were found meeting these conditions. Showing recent visits within past 90 days and meeting all other requirements Today's Visits Date Type Provider Dept  03/19/20 Office Visit Brandon Santa, MD [facility]  Showing today's visits and meeting all other requirements Future Appointments No visits were found meeting these conditions. Showing future appointments within next 90 days and meeting all other requirements  I discussed the assessment and treatment plan with the patient. The patient was provided an opportunity to ask questions and all were answered. The patient agreed with the plan and demonstrated an understanding of the instructions.  Patient advised to call back or seek an in-person evaluation if the symptoms or condition worsens.  Duration of encounter: 30 minutes.  Note by: Brandon Santa, MD Date: 03/19/2020; Time: 2:17 PM

## 2020-03-23 LAB — TOXASSURE SELECT 13 (MW), URINE

## 2020-04-13 ENCOUNTER — Ambulatory Visit: Payer: Self-pay | Admitting: Family Medicine

## 2020-04-13 NOTE — Telephone Encounter (Signed)
Called pt advised of jolene's message scheduled for 9/15

## 2020-04-13 NOTE — Telephone Encounter (Signed)
No available appt until Thursday please advise.

## 2020-04-13 NOTE — Telephone Encounter (Signed)
Pt reports covid exposure Friday, symptoms Saturday. States indirect exposure but son and wife all have similar symptoms. Son at home, 14 yrs. Covid exposure school related.  Pt reports dry cough, chest tightness, sore throat, "Chills sometimes, not bad." Decreased sense of taste. Denies any SOB. Has not been vaccinated. Pt questioning if something for cough could be called in. Advised may need virtual appt.  Quarantine precautions reviewed. Assured NT would route to practice for PCPs review.  Symptom tier reviewed, symptoms that warrant ED eval. Pt verbalizes understanding.  CB# [phone]  Reason for Disposition . [1] COVID-19 infection suspected by caller or triager AND [2] mild symptoms (cough, fever, or others) AND [3] no complications or SOB  Answer Assessment - Initial Assessment Questions 1. COVID-19 DIAGNOSIS: "Who made your Coronavirus (COVID-19) diagnosis?" "Was it confirmed by a positive lab test?" If not diagnosed by a HCP, ask "Are there lots of cases (community spread) where you live?" (See public health department website, if unsure)     Exposure 2. COVID-19 EXPOSURE: "Was there any known exposure to COVID before the symptoms began?" CDC Definition of close contact: within 6 feet (2 meters) for a total of 15 minutes or more over a 24-hour period.      Yes 3. ONSET: "When did the COVID-19 symptoms start?"      Saturday 4. WORST SYMPTOM: "What is your worst symptom?" (e.g., cough, fever, shortness of breath, muscle aches)     cough 5. COUGH: "Do you have a cough?" If Yes, ask: "How bad is the cough?"      Yes. Dry cough 6. FEVER: "Do you have a fever?" If Yes, ask: "What is your temperature, how was it measured, and when did it start?"     Unsure 7. RESPIRATORY STATUS: "Describe your breathing?" (e.g., shortness of breath, wheezing, unable to speak)     Chest tightness, no SOB 8. BETTER-SAME-WORSE: "Are you getting better, staying the same or getting worse compared to  yesterday?"  If getting worse, ask, "In what way?"     *No Answer* 9. HIGH RISK DISEASE: "Do you have any chronic medical problems?" (e.g., asthma, heart or lung disease, weak immune system, obesity, etc.)     *No Answer*  11. OTHER SYMPTOMS: "Do you have any other symptoms?"  (e.g., chills, fatigue, headache, loss of smell or taste, muscle pain, sore throat; new loss of smell or taste especially support the diagnosis of COVID-19)      Sore throat, taste decreased  Protocols used: CORONAVIRUS (COVID-19) DIAGNOSED OR SUSPECTED-A-AH

## 2020-04-13 NOTE — Telephone Encounter (Signed)
Noted  

## 2020-04-13 NOTE — Telephone Encounter (Signed)
Will need visit, please see if can get on Cheryl's schedule for virtual on Thursday.  At this time due to current medications he is prescribed, I recommend he use over the counter Mucinex or Delsym for cough + use Ventolin inhaler as needed.  I also recommend he obtain Covid testing as soon as possible and to self quarantine until symptoms have improved --- if Covid test positive will need to quarantine for 10 days.

## 2020-04-15 ENCOUNTER — Encounter: Payer: Self-pay | Admitting: Family Medicine

## 2020-04-15 ENCOUNTER — Telehealth (INDEPENDENT_AMBULATORY_CARE_PROVIDER_SITE_OTHER): Payer: Medicare Other | Admitting: Family Medicine

## 2020-04-15 ENCOUNTER — Telehealth: Payer: Self-pay | Admitting: Unknown Physician Specialty

## 2020-04-15 ENCOUNTER — Other Ambulatory Visit: Payer: Self-pay | Admitting: Unknown Physician Specialty

## 2020-04-15 VITALS — Ht 68.0 in

## 2020-04-15 DIAGNOSIS — U071 COVID-19: Secondary | ICD-10-CM

## 2020-04-15 DIAGNOSIS — I10 Essential (primary) hypertension: Secondary | ICD-10-CM

## 2020-04-15 MED ORDER — PREDNISONE 50 MG PO TABS: 50.0000 mg | ORAL_TABLET | Freq: Every day | ORAL | 0 refills | Status: DC

## 2020-04-15 MED ORDER — BENZONATATE 200 MG PO CAPS: 200.0000 mg | ORAL_CAPSULE | Freq: Two times a day (BID) | ORAL | 0 refills | Status: DC | PRN

## 2020-04-15 NOTE — Progress Notes (Signed)
Ht 5\' 8"  (1.727 m)   BMI 30.71 kg/m    Subjective:    Patient ID: Brandon Myers, male    DOB: Oct 08, 1965, 54 y.o.   MRN: [id number]  HPI: Brandon Myers is a 54 y.o. male  Chief Complaint  Patient presents with  . Covid Positive    tested positive monday 9/13 at [facility], cough, congestion, tightness in chest, hoarse, fever, fatigue, chills    UPPER RESPIRATORY TRACT INFECTION- Dx'd with COVID on Monday  Duration: Saturday Worst symptom: tightness in his chest Fever: yes Cough: yes Shortness of breath: yes Wheezing: yes Chest pain: no Chest tightness: yes Chest congestion: no Nasal congestion: yes Runny nose: yes Post nasal drip: yes Sneezing: no Sore throat: no Swollen glands: no Sinus pressure: no Headache: yes Face pain: no Toothache: no Ear pain: no  Ear pressure: no  Eyes red/itching:no Eye drainage/crusting: no  Vomiting: no Rash: no Fatigue: yes Sick contacts: yes Strep contacts: no  Context: worse Recurrent sinusitis: no Relief with OTC cold/cough medications: no  Treatments attempted: none   Relevant past medical, surgical, family and social history reviewed and updated as indicated. Interim medical history since our last visit reviewed. Allergies and medications reviewed and updated.  Review of Systems  Constitutional: Positive for chills, diaphoresis, fatigue and fever. Negative for activity change, appetite change and unexpected weight change.  HENT: Positive for congestion and postnasal drip. Negative for dental problem, drooling, ear discharge, ear pain, facial swelling, hearing loss, mouth sores, nosebleeds, rhinorrhea, sinus pressure, sinus pain, sneezing, sore throat, tinnitus, trouble swallowing and voice change.   Eyes: Negative.   Respiratory: Positive for cough, chest tightness and shortness of breath. Negative for choking, wheezing and stridor.   Cardiovascular: Negative.   Gastrointestinal: Negative.   Musculoskeletal:  Negative.   Psychiatric/Behavioral: Negative.     Per HPI unless specifically indicated above     Objective:    Ht 5\' 8"  (1.727 m)   BMI 30.71 kg/m   Wt Readings from Last 3 Encounters:  03/19/20 202 lb (91.6 kg)  03/05/20 202 lb (91.6 kg)  01/20/20 198 lb (89.8 kg)    Physical Exam Vitals and nursing note reviewed.  Constitutional:      General: He is not in acute distress.    Appearance: Normal appearance. He is not ill-appearing, toxic-appearing or diaphoretic.  HENT:     Head: Normocephalic and atraumatic.     Right Ear: External ear normal.     Left Ear: External ear normal.     Nose: Nose normal.     Mouth/Throat:     Mouth: Mucous membranes are moist.     Pharynx: Oropharynx is clear.  Eyes:     General: No scleral icterus.       Right eye: No discharge.        Left eye: No discharge.     Conjunctiva/sclera: Conjunctivae normal.     Pupils: Pupils are equal, round, and reactive to light.  Pulmonary:     Effort: Pulmonary effort is normal. No respiratory distress.     Comments: Speaking in full sentences Musculoskeletal:        General: Normal range of motion.     Cervical back: Normal range of motion.  Skin:    Coloration: Skin is not jaundiced or pale.     Findings: No bruising, erythema, lesion or rash.  Neurological:     Mental Status: He is alert and oriented to person, place, and  time. Mental status is at baseline.  Psychiatric:        Mood and Affect: Mood normal.        Behavior: Behavior normal.        Thought Content: Thought content normal.        Judgment: Judgment normal.     Results for orders placed or performed in visit on 03/19/20  ToxASSURE Select 13 (MW), Urine  Result Value Ref Range   Summary Note       Assessment & Plan:   Problem List Items Addressed This Visit    None    Visit Diagnoses    COVID-19    -  Primary   Self-quarantine. Lungs very tight, will treat with prednoisone and tessalon. Will see about getting him  hooked up with the infusion clinic. Recheck 1 week.        Follow up plan: Return in about 1 week (around 04/22/2020).   . This visit was completed via MyChart due to the restrictions of the COVID-19 pandemic. All issues as above were discussed and addressed. Physical exam was done as above through visual confirmation on MyChart. If it was felt that the patient should be evaluated in the office, they were directed there. The patient verbally consented to this visit. . Location of the patient: home . Location of the provider: work . Those involved with this call:  . Provider: Olevia Perches, DO . CMA: Elton Sin, CMA . Front Desk/Registration: Adela Ports  . Time spent on call: 15 minutes with patient face to face via video conference. More than 50% of this time was spent in counseling and coordination of care. 23 minutes total spent in review of patient's record and preparation of their chart.

## 2020-04-15 NOTE — Telephone Encounter (Signed)
I connected by phone with Brandon Myers on 04/15/2020 at 4:34 PM to discuss the potential use of a new treatment for mild to moderate COVID-19 viral infection in non-hospitalized patients.  This patient is a 54 y.o. male that meets the FDA criteria for Emergency Use Authorization of COVID monoclonal antibody casirivimab/imdevimab.  Has a (+) direct SARS-CoV-2 viral test result  Has mild or moderate COVID-19   Is NOT hospitalized due to COVID-19  Is within 10 days of symptom onset  Has at least one of the high risk factor(s) for progression to severe COVID-19 and/or hospitalization as defined in EUA.  Specific high risk criteria : Cardiovascular disease or hypertension   I have spoken and communicated the following to the patient or parent/caregiver regarding COVID monoclonal antibody treatment:  1. FDA has authorized the emergency use for the treatment of mild to moderate COVID-19 in adults and pediatric patients with positive results of direct SARS-CoV-2 viral testing who are 17 years of age and older weighing at least 40 kg, and who are at high risk for progressing to severe COVID-19 and/or hospitalization.  2. The significant known and potential risks and benefits of COVID monoclonal antibody, and the extent to which such potential risks and benefits are unknown.  3. Information on available alternative treatments and the risks and benefits of those alternatives, including clinical trials.  4. Patients treated with COVID monoclonal antibody should continue to self-isolate and use infection control measures (e.g., wear mask, isolate, social distance, avoid sharing personal items, clean and disinfect "high touch" surfaces, and frequent handwashing) according to CDC guidelines.   5. The patient or parent/caregiver has the option to accept or refuse COVID monoclonal antibody treatment.  After reviewing this information with the patient, The patient agreed to proceed with receiving  casirivimab\imdevimab infusion and will be provided a copy of the Fact sheet prior to receiving the infusion. [name] 04/15/2020 4:34 PM  Sx onset 9/11

## 2020-04-16 ENCOUNTER — Telehealth: Payer: Self-pay | Admitting: Family Medicine

## 2020-04-16 ENCOUNTER — Encounter: Payer: Self-pay | Admitting: Family Medicine

## 2020-04-16 NOTE — Telephone Encounter (Signed)
-----   Message from Dorcas Carrow, [place] sent at 04/15/2020  4:02 PM EDT ----- 1 week virtual

## 2020-04-16 NOTE — Telephone Encounter (Signed)
Left vm to make this virtual apt.

## 2020-04-16 NOTE — Progress Notes (Signed)
Lvm to make this virtual apt  

## 2020-04-17 ENCOUNTER — Ambulatory Visit (HOSPITAL_COMMUNITY): Payer: Medicare Other | Attending: Pulmonary Disease

## 2020-04-22 ENCOUNTER — Encounter: Payer: Self-pay | Admitting: Unknown Physician Specialty

## 2020-04-22 ENCOUNTER — Other Ambulatory Visit: Payer: Self-pay

## 2020-04-22 ENCOUNTER — Telehealth (INDEPENDENT_AMBULATORY_CARE_PROVIDER_SITE_OTHER): Payer: Medicare Other | Admitting: Unknown Physician Specialty

## 2020-04-22 DIAGNOSIS — U071 COVID-19: Secondary | ICD-10-CM | POA: Diagnosis not present

## 2020-04-22 NOTE — Progress Notes (Signed)
There were no vitals taken for this visit.   Subjective:    Patient ID: Brandon Myers, male    DOB: 1965-11-02, 54 y.o.   MRN: [id number]  HPI: Brandon Myers is a 55 y.o. male  Chief Complaint  Patient presents with  . Covid Positive    onset week--cough brown mucus, chest tightness denies fever and has mild SOB onset Sx getting worst from yesterday    Due to the catastrophic nature of the COVID-19 pandemic, this visit was completed via audio and visual contact via [facility] due to the restrictions of the COVID-19 pandemic. All issues as above were discussed and addressed. Physical exam was done as above through visual confirmation on [employer]. If it was felt that the patient should be evaluated in the office, they were directed there. The patient verbally consented to this visit."} . Location of the patient: home . Location of the provider: work . Those involved with this call:  . Provider: [name], DNP . CMA: Wilhemena Durie, CMA . Front Desk/Registration: Harriet Pho  . Time spent on call: 15 minutes with patient face to face via video conference. More than 50% of this time was spent in counseling and coordination of care. 5 minutes total spent in review of patient's record and preparation of their chart.  I verified patient identity using two factors (patient name and date of birth). Patient consents verbally to being seen via telemedicine visit today.   Pt with worsening cough and SOB.  No fever for 24 hours.  Offered mab last week and refused.  On day 11 of Covid and outside mab window.  He thinks he might have pneumonia.  Doesn't have a pulse ox.    Cough Episode onset: past 2 weeks. The problem has been gradually worsening. The problem occurs constantly. The cough is non-productive. Associated symptoms include chest pain, a fever, nasal congestion, rhinorrhea and shortness of breath. Pertinent negatives include no chills, ear congestion, ear pain, headaches or  weight loss. Nothing aggravates the symptoms. He has tried nothing for the symptoms.     Relevant past medical, surgical, family and social history reviewed and updated as indicated. Interim medical history since our last visit reviewed. Allergies and medications reviewed and updated.  Review of Systems  Constitutional: Positive for fever. Negative for chills and weight loss.  HENT: Positive for rhinorrhea. Negative for ear pain.   Respiratory: Positive for cough and shortness of breath.   Cardiovascular: Positive for chest pain.  Neurological: Negative for headaches.    Per HPI unless specifically indicated above     Objective:    There were no vitals taken for this visit.  Wt Readings from Last 3 Encounters:  03/19/20 202 lb (91.6 kg)  03/05/20 202 lb (91.6 kg)  01/20/20 198 lb (89.8 kg)    Physical Exam Constitutional:      Appearance: He is obese. He is ill-appearing.  HENT:     Head: Normocephalic.     Mouth/Throat:     Mouth: Mucous membranes are dry.  Neurological:     Comments: Baseline is unclear.  Difficult to form a full sentence and ? Related to SOB or baseline cognition     Results for orders placed or performed in visit on 03/19/20  ToxASSURE Select 13 (MW), Urine  Result Value Ref Range   Summary Note       Assessment & Plan:   Problem List Items Addressed This Visit    None  Visit Diagnoses    COVID-19    -  Primary   High risk pt with worsening SOB and cough day 11 of Covid sxs.  ER evaluation recommended.  Pt feels he can get to the ER.        Consulted with Dr. Laural Benes who also helped assess pt via video  Follow up plan: Return Evaluation in ER or UC.

## 2020-04-28 ENCOUNTER — Telehealth: Payer: Self-pay | Admitting: Unknown Physician Specialty

## 2020-04-28 NOTE — Telephone Encounter (Signed)
-----   Message from [name], NP sent at 04/22/2020 11:20 AM EDT ----- Can you check on him this afternoon.  He is supposed to go to the ER

## 2020-04-28 NOTE — Telephone Encounter (Signed)
Called home and cell to get pt on the schedule for covid fu. No answer, will call back.

## 2020-06-11 ENCOUNTER — Encounter: Payer: Self-pay | Admitting: Student in an Organized Health Care Education/Training Program

## 2020-06-11 ENCOUNTER — Ambulatory Visit
Payer: Medicare Other | Attending: Student in an Organized Health Care Education/Training Program | Admitting: Student in an Organized Health Care Education/Training Program

## 2020-06-11 ENCOUNTER — Other Ambulatory Visit: Payer: Self-pay

## 2020-06-11 VITALS — BP 125/95 | HR 81 | Temp 97.0°F | Ht 68.0 in | Wt 198.0 lb

## 2020-06-11 DIAGNOSIS — G8929 Other chronic pain: Secondary | ICD-10-CM

## 2020-06-11 DIAGNOSIS — D700 Congenital agranulocytosis: Secondary | ICD-10-CM

## 2020-06-11 DIAGNOSIS — M545 Low back pain, unspecified: Secondary | ICD-10-CM

## 2020-06-11 DIAGNOSIS — G546 Phantom limb syndrome with pain: Secondary | ICD-10-CM | POA: Diagnosis not present

## 2020-06-11 DIAGNOSIS — S88919A Complete traumatic amputation of unspecified lower leg, level unspecified, initial encounter: Secondary | ICD-10-CM

## 2020-06-11 DIAGNOSIS — S48911S Complete traumatic amputation of right shoulder and upper arm, level unspecified, sequela: Secondary | ICD-10-CM

## 2020-06-11 DIAGNOSIS — G894 Chronic pain syndrome: Secondary | ICD-10-CM

## 2020-06-11 DIAGNOSIS — M79604 Pain in right leg: Secondary | ICD-10-CM | POA: Diagnosis not present

## 2020-06-11 MED ORDER — HYDROCODONE-ACETAMINOPHEN 10-325 MG PO TABS: 1.0000 | ORAL_TABLET | Freq: Two times a day (BID) | ORAL | 0 refills | Status: AC | PRN

## 2020-06-11 MED ORDER — GABAPENTIN 300 MG PO CAPS: ORAL_CAPSULE | ORAL | 0 refills | Status: DC

## 2020-06-11 MED ORDER — HYDROCODONE-ACETAMINOPHEN 10-325 MG PO TABS: 1.0000 | ORAL_TABLET | Freq: Two times a day (BID) | ORAL | 0 refills | Status: DC | PRN

## 2020-06-11 NOTE — Progress Notes (Signed)
Nursing Pain Medication Assessment:  Safety precautions to be maintained throughout the outpatient stay will include: orient to surroundings, keep bed in low position, maintain call bell within reach at all times, provide assistance with transfer out of bed and ambulation.  Medication Inspection Compliance: Pill count conducted under aseptic conditions, in front of the patient. Neither the pills nor the bottle was removed from the patient's sight at any time. Once count was completed pills were immediately returned to the patient in their original bottle.  Medication: Hydrocodone/APAP Pill/Patch Count: 50.5 of 75 pills remain Pill/Patch Appearance: Markings consistent with prescribed medication Bottle Appearance: Standard pharmacy container. Clearly labeled. Filled Date: 10 / 27 / 2021 Last Medication intake:  Today

## 2020-06-11 NOTE — Patient Instructions (Signed)
Gabapentin and hydrocodone sent to pharmacy.

## 2020-06-11 NOTE — Progress Notes (Signed)
PROVIDER NOTE: Information contained herein reflects review and annotations entered in association with encounter. Interpretation of such information and data should be left to medically-trained personnel. Information provided to patient can be located elsewhere in the medical record under "Patient Instructions". Document created using STT-dictation technology, any transcriptional errors that may result from process are unintentional.    Patient: [name]  Service Category: E/M  Provider: Edward Jolly, MD  DOB: 1966-05-13  DOS: 06/11/2020  Specialty: Interventional Pain Management  MRN: [id number]  Setting: Ambulatory outpatient  PCP: Particia Nearing, PA-C  Type: Established Patient    Referring Provider: Particia Nearing,*  Location: Office  Delivery: Face-to-face     HPI  Mr. CRISTON CHANCELLOR, a 54 y.o. year old male, is here today because of his 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(HCC) [S88.919A]. Mr. Douthit primary complain today is Leg Pain (right) Last encounter: My last encounter with him was on 03/19/2020. Pertinent problems: Mr. Haberle has Congenital agranulocytosis (HCC); Congenital neutropenia (HCC); Amputation of right arm (HCC); 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(HCC); Phantom limb pain (HCC); Chronic pain of right lower extremity; Chronic pain syndrome; and Acute right-sided low back pain without sciatica on their pertinent problem list. Pain Assessment: Severity of Chronic pain, Phantom pain is reported as a 5 /10. Location: Leg (feels like leg is asleep and trying to wake up) Right/ . Onset: More than a month ago. Quality:  (feels like leg is asleep and trying to wake up). Timing: Constant. Modifying factor(s): meds. Vitals:  height is 5\' 8"  (1.727 m) and weight is 198 lb (89.8 kg). His temporal temperature is 97 F (36.1 C) (abnormal). His blood pressure is 125/95 (abnormal) and his pulse is 81. His oxygen saturation is 100%.   Reason for encounter: medication management.   No change in medical history  since last visit.  Patient's pain is at baseline.  Patient continues multimodal pain regimen as prescribed.  States that it provides pain relief and improvement in functional status.  Pharmacotherapy Assessment   Analgesic: Hydrocodone 10 mg every 8 hours as needed, quantity 75/month    Monitoring: [place] PMP: PDMP reviewed during this encounter.       Pharmacotherapy: No side-effects or adverse reactions reported. Compliance: No problems identified. Effectiveness: Clinically acceptable.  , RN  06/11/2020 12:56 PM  Sign when Signing Visit Nursing Pain Medication Assessment:  Safety precautions to be maintained throughout the outpatient stay will include: orient to surroundings, keep bed in low position, maintain call bell within reach at all times, provide assistance with transfer out of bed and ambulation.  Medication Inspection Compliance: Pill count conducted under aseptic conditions, in front of the patient. Neither the pills nor the bottle was removed from the patient's sight at any time. Once count was completed pills were immediately returned to the patient in their original bottle.  Medication: Hydrocodone/APAP Pill/Patch Count: 50.5 of 75 pills remain Pill/Patch Appearance: Markings consistent with prescribed medication Bottle Appearance: Standard pharmacy container. Clearly labeled. Filled Date: 10 / 27 / 2021 Last Medication intake:  Today    UDS:  Summary  Date Value Ref Range Status  03/19/2020 Note  Final    Comment:    ==================================================================== ToxASSU[employer] Select 13 (MW) ==================================================================== Test                             Result       Flag       Units  Drug Present  and Declared for Prescription Verification   Hydrocodone                    1335         EXPECTED   ng/mg creat   Dihydrocodeine                 306          EXPECTED   ng/mg creat   Norhydrocodone                  1491         EXPECTED   ng/mg creat    Sources of hydrocodone include scheduled prescription medications.    Dihydrocodeine and norhydrocodone are expected metabolites of    hydrocodone. Dihydrocodeine is also available as a scheduled    prescription medication.  ==================================================================== Test                      Result    Flag   Units      Ref Range   Creatinine              34               mg/dL      >=20 ==================================================================== Declared Medications:  The flagging and interpretation on this report are based on the  following declared medications.  Unexpected results may arise from  inaccuracies in the declared medications.   **Note: The testing scope of this panel includes these medications:   Hydrocodone (Norco)   **Note: The testing scope of this panel does not include the  following reported medications:   Acetaminophen (Norco)  Albuterol (Ventolin HFA)  Cyclobenzaprine (Flexeril)  Filgrastim (Zarxio)  Gabapentin (Neurontin)  Hydroxyzine (Atarax)  Loratadine (Claritin)  Losartan (Cozaar)  Melatonin  Meloxicam (Mobic)  Metoprolol (Toprol)  Nortriptyline (Pamelor)  Omeprazole (Prilosec) ==================================================================== For clinical consultation, please call [phone]. ====================================================================      ROS  Constitutional: Denies any fever or chills Gastrointestinal: No reported hemesis, hematochezia, vomiting, or acute GI distress Musculoskeletal: Denies any acute onset joint swelling, redness, loss of ROM, or weakness Neurological: No reported episodes of acute onset apraxia, aphasia, dysarthria, agnosia, amnesia, paralysis, loss of coordination, or loss of consciousness  Medication Review  HYDROcodone-acetaminophen, albuterol, cyclobenzaprine, filgrastim-sndz, gabapentin, hydrOXYzine,  loratadine, losartan, meloxicam, metoprolol succinate, nortriptyline, and omeprazole  History Review  Allergy: Mr. Buskey is allergic to enalapril, hydrochlorothiazide, prochlorperazine, sulfa antibiotics, benadryl [diphenhydramine], and strawberry extract. Drug: Mr. Cianci  reports no history of drug use. Alcohol:  reports no history of alcohol use. Tobacco:  reports that he has never smoked. He has never used smokeless tobacco. Social: Mr. Mochizuki  reports that he has never smoked. He has never used smokeless tobacco. He reports that he does not drink alcohol and does not use drugs. Medical:  has a past medical history of Allergy, Anxiety (01/21/2020), Controlled substance agreement signed (04/02/2017), GERD (gastroesophageal reflux disease), Hypertension, Neutropenia, congenital ([place]), and Phantom limb pain ([place]). Surgical: Mr. Fielden  has a past surgical history that includes Amputation; Appendectomy; Colon surgery (1994); Above elbow arm amputation (Right, 1993); and Leg amputation through knee (Right, 1993). Family: family history includes Alzheimer's disease in his mother; Asthma in his mother; Cancer in his father, maternal grandmother, paternal grandfather, and paternal grandmother; Depression in his mother; Heart Problems in his mother; Hypertension in his father and mother; Stroke in his maternal grandmother; Thyroid disease in his mother.  Laboratory  Chemistry Profile   Renal Lab Results  Component Value Date   BUN 13 12/23/2019   CREATININE 0.86 12/23/2019   BCR 15 12/23/2019   GFRAA 116 12/23/2019   GFRNONAA 100 12/23/2019     Hepatic Lab Results  Component Value Date   AST 67 (H) 12/23/2019   ALT 69 (H) 12/23/2019   ALBUMIN 4.6 12/23/2019   ALKPHOS 141 (H) 12/23/2019     Electrolytes Lab Results  Component Value Date   NA 139 12/23/2019   K 4.6 12/23/2019   CL 99 12/23/2019   CALCIUM 9.4 12/23/2019     Bone No results found for: VD25OH, VD125OH2TOT, GQ6761PJ0,  DT2671IW5, 25OHVITD1, 25OHVITD2, 25OHVITD3, TESTOFREE, TESTOSTERONE   Inflammation (CRP: Acute Phase) (ESR: Chronic Phase) No results found for: CRP, ESRSEDRATE, LATICACIDVEN     Note: Above Lab results reviewed.  Recent Imaging Review  DG Neck Soft Tissue CLINICAL DATA:  Esophageal foreign body. Foreign body sensation after eating chicken. Difficulty swallowing.  EXAM: NECK SOFT TISSUES - 1+ VIEW  COMPARISON:  None.  FINDINGS: There is no evidence of retropharyngeal soft tissue swelling or epiglottic enlargement. The cervical airway is unremarkable. No subcutaneous emphysema. Soft tissues are non suspicious. No radio-opaque foreign body identified. Patient is edentulous.  IMPRESSION: Negative soft tissue neck radiographs. No radiopaque foreign body in the neck.  Electronically Signed   By: Keith Rake M.D.   On: 10/27/2018 00:37 Note: Reviewed        Physical Exam  General appearance: Well nourished, well developed, and well hydrated. In no apparent acute distress Mental status: Alert, oriented x 3 (person, place, & time)       Respiratory: No evidence of acute respiratory distress Eyes: PERLA Vitals: BP (!) 125/95   Pulse 81   Temp (!) 97 F (36.1 C) (Temporal)   Ht $R'5\' 8"'rM$  (1.727 m)   Wt 198 lb (89.8 kg)   SpO2 100%   BMI 30.11 kg/m  BMI: Estimated body mass index is 30.11 kg/m as calculated from the following:   Height as of this encounter: $RemoveBeforeD'5\' 8"'mPcbcYkttekAoE$  (1.727 m).   Weight as of this encounter: 198 lb (89.8 kg). Ideal: Ideal body weight: 68.4 kg (150 lb 12.7 oz) Adjusted ideal body weight: 77 kg (169 lb 10.8 oz)  Lumbar Spine Area Exam  Skin & Axial Inspection:No masses, redness, or swelling Alignment:Symmetrical Functional YKD:[id number] ROM Stability:No instability detected Muscle Tone/Strength:Functionally intact. No obvious neuro-muscular anomalies detected. Sensory (Neurological): Low back pain, musculoskeletal, . Palpation:No palpable  anomalies Provocative Tests: Lumbar Hyperextension/rotation test:deferred today Lumbar quadrant test (Kemp's test):deferred today Lumbar Lateral bending test:deferred today Patrick's Maneuver:deferred today FABER test:deferred today Thigh-thrust test:deferred today S-I compression test:deferred today S-I distraction test:deferred today  Gait & Posture Assessment  Ambulation:Limited, prosthesis in place Gait:Antalgic gait (limping) Posture:Difficulty with positional changes  Lower Extremity Exam    Side:Right lower extremity  Side:Left lower extremity  Stability:No instability observed  Stability:No instability observed  Skin & Extremity Inspection:Above knee amputation (AKA)  Skin & Extremity Inspection:Skin color, temperature, and hair growth are WNL. No peripheral edema or cyanosis. No masses, redness, swelling, asymmetry, or associated skin lesions. No contractures.  Functional QBH:ALPFXTKWI ROM   Functional OXB:DZHGDJMEQAST ROM   Muscle Tone/Strength:Functionally intact. No obvious neuro-muscular anomalies detected.  Muscle Tone/Strength:Functionally intact. No obvious neuro-muscular anomalies detected.  Sensory (Neurological):Neuropathic pain pattern  Sensory (Neurological):Unimpaired  Palpation:No palpable anomalies  Palpation:No palpable anomalies     Assessment   Status Diagnosis  Controlled Controlled Controlled 1. Amputation of  leg ([place])   2. Phantom limb pain ([place])   3. Congenital neutropenia (HCC)   4. Chronic pain of right lower extremity   5. Amputation of right upper extremity, sequela ([place])   6. Acute right-sided low back pain without sciatica   7. Chronic pain syndrome       Plan of Care  Mr. LADARIEN BEEKS has a current medication list which includes the following long-term medication(s): albuterol, gabapentin,  gabapentin, loratadine, losartan, metoprolol succinate, nortriptyline, omeprazole, and ventolin hfa.  Pharmacotherapy (Medications Ordered): Meds ordered this encounter  Medications  . HYDROcodone-acetaminophen (NORCO) 10-325 MG tablet    Sig: Take 1-2 tablets by mouth every 12 (twelve) hours as needed for severe pain. Must last 30 days.    Dispense:  60 tablet    Refill:  0    [facility] STOP ACT - Not applicable. Fill one day early if pharmacy is closed on scheduled refill date.  Marland Kitchen HYDROcodone-acetaminophen (NORCO) 10-325 MG tablet    Sig: Take 1-2 tablets by mouth every 12 (twelve) hours as needed for severe pain. Must last 30 days.    Dispense:  60 tablet    Refill:  0    [place] STOP ACT - Not applicable. Fill one day early if pharmacy is closed on scheduled refill date.  Marland Kitchen HYDROcodone-acetaminophen (NORCO) 10-325 MG tablet    Sig: Take 1-2 tablets by mouth every 12 (twelve) hours as needed for severe pain. Must last 30 days.    Dispense:  60 tablet    Refill:  0    [employer] STOP ACT - Not applicable. Fill one day early if pharmacy is closed on scheduled refill date.  . gabapentin (NEURONTIN) 300 MG capsule    Sig: 300 mg in AM    Dispense:  90 capsule    Refill:  0   Follow-up plan:   Return in about 3 months (around 09/11/2020) for Medication Management, in person.   Recent Visits Date Type Provider Dept  03/19/20 Office Visit [name], MD [facility]  Showing recent visits within past 90 days and meeting all other requirements Today's Visits Date Type Provider Dept  06/11/20 Office Visit [name], MD [facility]  Showing today's visits and meeting all other requirements Future Appointments No visits were found meeting these conditions. Showing future appointments within next 90 days and meeting all other requirements  I discussed the assessment and treatment plan with the patient. The patient was provided an opportunity to ask questions and all were answered.  The patient agreed with the plan and demonstrated an understanding of the instructions.  Patient advised to call back or seek an in-person evaluation if the symptoms or condition worsens.  Duration of encounter: 30 minutes.  Note by: [name], MD Date: 06/11/2020; Time: 1:20 PM

## 2020-06-17 ENCOUNTER — Other Ambulatory Visit: Payer: Self-pay

## 2020-06-17 ENCOUNTER — Ambulatory Visit (INDEPENDENT_AMBULATORY_CARE_PROVIDER_SITE_OTHER): Payer: Medicare Other | Admitting: Family Medicine

## 2020-06-17 ENCOUNTER — Encounter: Payer: Self-pay | Admitting: Family Medicine

## 2020-06-17 VITALS — BP 127/86 | HR 84 | Temp 98.9°F | Resp 17 | Ht 68.0 in | Wt 198.0 lb

## 2020-06-17 DIAGNOSIS — K5903 Drug induced constipation: Secondary | ICD-10-CM

## 2020-06-17 DIAGNOSIS — Z23 Encounter for immunization: Secondary | ICD-10-CM

## 2020-06-17 DIAGNOSIS — Z1211 Encounter for screening for malignant neoplasm of colon: Secondary | ICD-10-CM | POA: Insufficient documentation

## 2020-06-17 DIAGNOSIS — Z7689 Persons encountering health services in other specified circumstances: Secondary | ICD-10-CM | POA: Insufficient documentation

## 2020-06-17 NOTE — Progress Notes (Signed)
Subjective:    Patient ID: Brandon Myers, male    DOB: 1965-09-14, 54 y.o.   MRN: [id number]  Brandon Myers is a 54 y.o. male presenting on 06/17/2020 for Establish Care   HPI  Brandon Myers presents to clinic as a new patient to establish for primary care.  Previous PCP was at [facility].  Records will not be requested, as are available in the EMR.  Past medical, family, and surgical history reviewed w/ pt.  He has concerns for intermittent constipation.  Has not taken anything for his symptoms.   Depression screen [facility] 2/9 01/20/2020 11/11/2019 05/30/2019  Decreased Interest 0 0 0  Down, Depressed, Hopeless 1 0 0  PHQ - 2 Score 1 0 0  Altered sleeping 0 - 0  Tired, decreased energy 1 - 1  Change in appetite 0 - 0  Feeling bad or failure about yourself  0 - 0  Trouble concentrating 0 - 0  Moving slowly or fidgety/restless 0 - 0  Suicidal thoughts 0 - 0  PHQ-9 Score 2 - 1    Social History   Tobacco Use  . Smoking status: Never Smoker  . Smokeless tobacco: Never Used  Vaping Use  . Vaping Use: Never used  Substance Use Topics  . Alcohol use: Never    Alcohol/week: 0.0 standard drinks  . Drug use: Never    Review of Systems  Constitutional: Negative.   HENT: Negative.   Eyes: Negative.   Respiratory: Negative.   Cardiovascular: Negative.   Gastrointestinal: Positive for constipation. Negative for abdominal distention, abdominal pain, anal bleeding, blood in stool, diarrhea, nausea, rectal pain and vomiting.  Endocrine: Negative.   Genitourinary: Negative.   Musculoskeletal: Negative.   Skin: Negative.   Allergic/Immunologic: Negative.   Neurological: Negative.   Hematological: Negative.   Psychiatric/Behavioral: Negative.    Per HPI unless specifically indicated above     Objective:    BP 127/86 (BP Location: Left Arm, Patient Position: Sitting, Cuff Size: Normal)   Pulse 84   Temp 98.9 F (37.2 C) (Oral)   Resp 17   Ht 5\' 8"  (1.727 m)   Wt  198 lb (89.8 kg)   SpO2 99%   BMI 30.11 kg/m   Wt Readings from Last 3 Encounters:  06/17/20 198 lb (89.8 kg)  06/11/20 198 lb (89.8 kg)  03/19/20 202 lb (91.6 kg)    Physical Exam Vitals and nursing note reviewed.  Constitutional:      General: He is not in acute distress.    Appearance: Normal appearance. He is well-developed and well-groomed. He is obese. He is not ill-appearing or toxic-appearing.  HENT:     Head: Normocephalic and atraumatic.     Nose:     Comments: 03/21/20 is in place, covering mouth and nose. Eyes:     General:        Right eye: No discharge.        Left eye: No discharge.     Extraocular Movements: Extraocular movements intact.     Conjunctiva/sclera: Conjunctivae normal.     Pupils: Pupils are equal, round, and reactive to light.  Cardiovascular:     Rate and Rhythm: Normal rate and regular rhythm.     Pulses: Normal pulses.     Heart sounds: Normal heart sounds. No murmur heard.  No friction rub. No gallop.   Pulmonary:     Effort: Pulmonary effort is normal. No respiratory distress.     Breath  sounds: Normal breath sounds.  Skin:    General: Skin is warm and dry.     Capillary Refill: Capillary refill takes less than 2 seconds.  Neurological:     General: No focal deficit present.     Mental Status: He is alert and oriented to person, place, and time.  Psychiatric:        Attention and Perception: Attention and perception normal.        Mood and Affect: Mood and affect normal.        Speech: Speech normal.        Behavior: Behavior normal. Behavior is cooperative.        Thought Content: Thought content normal.        Cognition and Memory: Cognition and memory normal.    Results for orders placed or performed in visit on 03/19/20  ToxASSURE Select 13 (MW), Urine  Result Value Ref Range   Summary Note       Assessment & Plan:   Problem List Items Addressed This Visit      Digestive   Drug-induced constipation    Discussed current  medications associated with his pain management can cause constipation.  Encouraged increased fluids as well as PRN usage of mirilax can help alleviate intermittent constipation.  Provided with samples of mirilax in clinic today.  To use and if has relief, can continue to use over the counter mirilax, if not achieving relief to RTC for re-evaluation.        Other   Encounter to establish care with new doctor - Primary    New patient establishment at [facility] for primary care. To RTC in 3 months for CPE      Influenza vaccine needed    Pt < age 55.  Needs annual influenza vaccine.  VIS provided.  Plan: 1. Administer Quad flu vaccine.          No orders of the defined types were placed in this encounter.  Follow up plan: Return in about 3 months (around 09/17/2020) for HTN F/U.   Charlaine Dalton, FNP Family Nurse Practitioner [facility] 06/17/2020, 2:29 PM

## 2020-06-17 NOTE — Patient Instructions (Signed)
Continue all medications as prescribed.  Try the cap full of mirilax daily mixed with a beverage of your choice.  This can help intermittent constipation.  Please let us know if this does not help resolve your constipation.  We will plan to see you back in 3 months for follow up on your hypertension  You will receive a survey after today's visit either digitally by e-mail or paper by USPS mail. Your experiences and feedback matter to [place].  Please respond so we know how we are doing as we provide care for you.  Call us with any questions/concerns/needs.  It is my goal to be available to you for your health concerns.  Thanks for choosing me to be a partner in your healthcare needs!  Charlaine Dalton, FNP-C Family Nurse Practitioner [facility] Phone: [phone]

## 2020-06-17 NOTE — Assessment & Plan Note (Signed)
Discussed current medications associated with his pain management can cause constipation.  Encouraged increased fluids as well as PRN usage of mirilax can help alleviate intermittent constipation.  Provided with samples of mirilax in clinic today.  To use and if has relief, can continue to use over the counter mirilax, if not achieving relief to RTC for re-evaluation.

## 2020-06-17 NOTE — Assessment & Plan Note (Signed)
New patient establishment at [facility] for primary care. To RTC in 3 months for CPE

## 2020-06-17 NOTE — Assessment & Plan Note (Signed)
Pt < age 54.  Needs annual influenza vaccine.  VIS provided.  Plan: 1. Administer Quad flu vaccine.  

## 2020-06-18 ENCOUNTER — Encounter: Payer: Medicare Other | Admitting: Student in an Organized Health Care Education/Training Program

## 2020-07-11 ENCOUNTER — Other Ambulatory Visit: Payer: Self-pay | Admitting: Family Medicine

## 2020-07-11 NOTE — Telephone Encounter (Signed)
Requested Prescriptions  Pending Prescriptions Disp Refills  . nortriptyline (PAMELOR) 25 MG capsule [Pharmacy Med Name: NORTRIPTYLINE 25MG  CAPSULES] 90 capsule 1    Sig: TAKE 1 CAPSULE(25 MG) BY MOUTH AT BEDTIME     Psychiatry:  Antidepressants - Heterocyclics (TCAs) Passed - 07/11/2020  5:16 PM      Passed - Valid encounter within last 6 months    Recent Outpatient Visits          3 weeks ago Encounter to establish care with new doctor   [facility], [facility], FNP   2 months ago COVID-19   [facility], NP   2 months ago COVID-19   [facility] [place], DO   4 months ago Strain of lumbar region, initial encounter   [facility], Particia Nearing   5 months ago Anxiety   [facility] [place], [place]

## 2020-07-26 ENCOUNTER — Other Ambulatory Visit: Payer: Self-pay | Admitting: Family Medicine

## 2020-07-26 DIAGNOSIS — I10 Essential (primary) hypertension: Secondary | ICD-10-CM

## 2020-08-07 ENCOUNTER — Ambulatory Visit (INDEPENDENT_AMBULATORY_CARE_PROVIDER_SITE_OTHER): Payer: Medicare Other | Admitting: Family Medicine

## 2020-08-07 ENCOUNTER — Encounter: Payer: Self-pay | Admitting: Family Medicine

## 2020-08-07 ENCOUNTER — Other Ambulatory Visit: Payer: Self-pay

## 2020-08-07 VITALS — BP 117/80 | HR 71 | Temp 97.3°F | Ht 68.0 in | Wt 199.8 lb

## 2020-08-07 DIAGNOSIS — R748 Abnormal levels of other serum enzymes: Secondary | ICD-10-CM

## 2020-08-07 DIAGNOSIS — S88919A Complete traumatic amputation of unspecified lower leg, level unspecified, initial encounter: Secondary | ICD-10-CM

## 2020-08-07 NOTE — Assessment & Plan Note (Signed)
Elevated alk phos, will repeat and add GGT for evaluation.  Labs to be drawn.

## 2020-08-07 NOTE — Progress Notes (Signed)
Subjective:    Patient ID: Brandon Myers, male    DOB: Mar 26, 1966, 55 y.o.   MRN: [id number]  Brandon Myers is a 55 y.o. male presenting on 08/07/2020 for Consult (Pt requesting a order for  lower extremity liner go go over his Rt leg amputation. X [facility] Attn: Annett Gula )   HPI  [name] presents to clinic for an order for a lower extremity liner that goes over his right leg amputation.  Reports his current liners have been getting worn and will need new.  Denies any other concerns today.  Depression screen [facility] 2/9 01/20/2020 11/11/2019 05/30/2019  Decreased Interest 0 0 0  Down, Depressed, Hopeless 1 0 0  PHQ - 2 Score 1 0 0  Altered sleeping 0 - 0  Tired, decreased energy 1 - 1  Change in appetite 0 - 0  Feeling bad or failure about yourself  0 - 0  Trouble concentrating 0 - 0  Moving slowly or fidgety/restless 0 - 0  Suicidal thoughts 0 - 0  PHQ-9 Score 2 - 1    Social History   Tobacco Use  . Smoking status: Never Smoker  . Smokeless tobacco: Never Used  Vaping Use  . Vaping Use: Never used  Substance Use Topics  . Alcohol use: Never    Alcohol/week: 0.0 standard drinks  . Drug use: Never    Review of Systems  Constitutional: Negative.   HENT: Negative.   Eyes: Negative.   Respiratory: Negative.   Cardiovascular: Negative.   Gastrointestinal: Negative.   Endocrine: Negative.   Genitourinary: Negative.   Musculoskeletal: Negative.   Skin: Negative.   Allergic/Immunologic: Negative.   Neurological: Negative.   Hematological: Negative.   Psychiatric/Behavioral: Negative.    Per HPI unless specifically indicated above     Objective:    BP 117/80 (BP Location: Left Arm, Patient Position: Sitting, Cuff Size: Normal)   Pulse 71   Temp (!) 97.3 F (36.3 C) (Temporal)   Ht $R'5\' 8"'Id$  (1.727 m)   Wt 199 lb 12.8 oz (90.6 kg)   SpO2 97%   BMI 30.38 kg/m   Wt Readings from Last 3 Encounters:  08/07/20 199 lb 12.8 oz (90.6 kg)  06/17/20 198 lb (89.8  kg)  06/11/20 198 lb (89.8 kg)    Physical Exam Vitals and nursing note reviewed.  Constitutional:      General: He is not in acute distress.    Appearance: Normal appearance. He is well-developed and well-groomed. He is obese. He is not ill-appearing or toxic-appearing.  HENT:     Head: Normocephalic and atraumatic.     Nose:     Comments: Lizbeth Bark is in place, covering mouth and nose. Eyes:     General:        Right eye: No discharge.        Left eye: No discharge.     Extraocular Movements: Extraocular movements intact.     Conjunctiva/sclera: Conjunctivae normal.     Pupils: Pupils are equal, round, and reactive to light.  Cardiovascular:     Pulses: Normal pulses.  Pulmonary:     Effort: Pulmonary effort is normal. No respiratory distress.  Skin:    General: Skin is warm and dry.     Capillary Refill: Capillary refill takes less than 2 seconds.  Neurological:     General: No focal deficit present.     Mental Status: He is alert and oriented to person, place, and time.  Psychiatric:        Attention and Perception: Attention and perception normal.        Mood and Affect: Mood and affect normal.        Speech: Speech normal.        Behavior: Behavior normal. Behavior is cooperative.        Thought Content: Thought content normal.        Cognition and Memory: Cognition and memory normal.    Results for orders placed or performed in visit on 03/19/20  ToxASSURE Select 13 (MW), Urine  Result Value Ref Range   Summary Note       Assessment & Plan:   Problem List Items Addressed This Visit      Other   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([place])    New liner for RLE amputation ordered.  To be faxed today.      Elevated alkaline phosphatase level - Primary    Elevated alk phos, will repeat and add GGT for evaluation.  Labs to be drawn.      Relevant Orders   COMPLETE METABOLIC PANEL WITH GFR   Gamma GT      No orders of the defined types were placed in this encounter.  Follow  up plan: Return in about 6 months (around 02/04/2021) for HTN F/U.   Harlin Rain, [place] [facility] Nurse Practitioner [facility] 08/07/2020, 1:50 PM

## 2020-08-07 NOTE — Assessment & Plan Note (Signed)
New liner for RLE amputation ordered.  To be faxed today.

## 2020-08-07 NOTE — Patient Instructions (Signed)
I have written your prescription for a new lower extremity liner for your right lower extremity amputation and we will send over to [facility].  We will plan to see you back in 6 months for hypertension follow up visit  You will receive a survey after today's visit either digitally by e-mail or paper by USPS mail. Your experiences and feedback matter to [place].  Please respond so we know how we are doing as we provide care for you.  Call us with any questions/concerns/needs.  It is my goal to be available to you for your health concerns.  Thanks for choosing me to be a partner in your healthcare needs!  Charlaine Dalton, FNP-C Family Nurse Practitioner [facility] Phone: [phone]

## 2020-08-11 ENCOUNTER — Other Ambulatory Visit: Payer: Self-pay | Admitting: Family Medicine

## 2020-09-10 ENCOUNTER — Ambulatory Visit
Payer: Medicare Other | Attending: Student in an Organized Health Care Education/Training Program | Admitting: Student in an Organized Health Care Education/Training Program

## 2020-09-10 ENCOUNTER — Other Ambulatory Visit: Payer: Self-pay

## 2020-09-10 ENCOUNTER — Encounter: Payer: Self-pay | Admitting: Student in an Organized Health Care Education/Training Program

## 2020-09-10 ENCOUNTER — Other Ambulatory Visit: Payer: Self-pay | Admitting: Student in an Organized Health Care Education/Training Program

## 2020-09-10 VITALS — BP 147/97 | HR 77 | Temp 97.3°F | Ht 68.0 in | Wt 195.0 lb

## 2020-09-10 DIAGNOSIS — G546 Phantom limb syndrome with pain: Secondary | ICD-10-CM | POA: Diagnosis not present

## 2020-09-10 DIAGNOSIS — M79604 Pain in right leg: Secondary | ICD-10-CM | POA: Insufficient documentation

## 2020-09-10 DIAGNOSIS — G894 Chronic pain syndrome: Secondary | ICD-10-CM | POA: Insufficient documentation

## 2020-09-10 DIAGNOSIS — M545 Low back pain, unspecified: Secondary | ICD-10-CM | POA: Diagnosis not present

## 2020-09-10 DIAGNOSIS — D700 Congenital agranulocytosis: Secondary | ICD-10-CM | POA: Diagnosis not present

## 2020-09-10 DIAGNOSIS — S48911S Complete traumatic amputation of right shoulder and upper arm, level unspecified, sequela: Secondary | ICD-10-CM | POA: Insufficient documentation

## 2020-09-10 DIAGNOSIS — G8929 Other chronic pain: Secondary | ICD-10-CM | POA: Diagnosis not present

## 2020-09-10 DIAGNOSIS — S88919A Complete traumatic amputation of unspecified lower leg, level unspecified, initial encounter: Secondary | ICD-10-CM | POA: Diagnosis not present

## 2020-09-10 MED ORDER — HYDROCODONE-ACETAMINOPHEN 10-325 MG PO TABS: 1.0000 | ORAL_TABLET | Freq: Two times a day (BID) | ORAL | 0 refills | Status: AC | PRN

## 2020-09-10 MED ORDER — HYDROCODONE-ACETAMINOPHEN 10-325 MG PO TABS: 1.0000 | ORAL_TABLET | Freq: Two times a day (BID) | ORAL | 0 refills | Status: DC | PRN

## 2020-09-10 NOTE — Progress Notes (Signed)
PROVIDER NOTE: Information contained herein reflects review and annotations entered in association with encounter. Interpretation of such information and data should be left to medically-trained personnel. Information provided to patient can be located elsewhere in the medical record under "Patient Instructions". Document created using STT-dictation technology, any transcriptional errors that may result from process are unintentional.    Patient: Brandon Myers  Service Category: E/M  Provider: [name], MD  DOB: 03-Mar-1966  DOS: 09/10/2020  Specialty: Interventional Pain Management  MRN: [id number]  Setting: Ambulatory outpatient  PCP: Verl Bangs, FNP  Type: Established Patient    Referring Provider: Volney American,*  Location: Office  Delivery: Face-to-face     HPI  [name], a 55 y.o. year old male, is here today because of his 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([place]) [S88.919A]. Brandon Myers primary complain today is Leg Pain Last encounter: My last encounter with him was on 09/10/2020. 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 Pain Assessment: Severity of Chronic pain is reported as a 5 /10. Location: Leg  /pain radiaties down to the sock of his right leg and thigh. Onset: More than a month ago. Quality: Numbness,Tingling,Stabbing,Aching. Timing: Intermittent. Modifying factor(s): meds. Vitals:  height is $RemoveB'5\' 8"'SdAykSOo$  (1.727 m) and weight is 195 lb (88.5 kg). His temperature is 97.3 F (36.3 C) (abnormal). His blood pressure is 147/97 (abnormal) and his pulse is 77. His oxygen saturation is 98%.   Reason for encounter: medication management.    No change in medical history since last visit.  Patient's pain is at  baseline.  Patient continues multimodal pain regimen as prescribed.  States that it provides pain relief and improvement in functional status. Utilizes MiraLAX as needed for constipation  Pharmacotherapy Assessment   Analgesic: Hydrocodone 10 mg every 12 hours as needed, quantity 60/month    Monitoring: [place] PMP: PDMP reviewed during this encounter.       Pharmacotherapy: No side-effects or adverse reactions reported. Compliance: No problems identified. Effectiveness: Clinically acceptable.  Chauncey Fischer, RN  09/10/2020  1:31 PM  Sign when Signing Visit Nursing Pain Medication Assessment:  Safety precautions to be maintained throughout the outpatient stay will include: orient to surroundings, keep bed in low position, maintain call bell within reach at all times, provide assistance with transfer out of bed and ambulation.  Medication Inspection Compliance: Pill count conducted under aseptic conditions, in front of the patient. Neither the pills nor the bottle was removed from the patient's sight at any time. Once count was completed pills were immediately returned to the patient in their original bottle.  Medication: Hydrocodone/APAP Pill/Patch Count: 42 of 60 pills remain Pill/Patch Appearance: Markings consistent with prescribed medication Bottle Appearance: Standard pharmacy container. Clearly labeled. Filled Date: 1 / 30 / 22 Last Medication intake:  YesterdaySafety precautions to be maintained throughout the outpatient stay will include: orient to surroundings, keep bed in low position, maintain call bell within reach at all times, provide assistance with transfer out of bed and ambulation.     UDS:  Summary  Date Value Ref Range Status  03/19/2020 Note  Final    Comment:    ==================================================================== ToxASSURE Select 13 (MW) ==================================================================== Test  Result        Flag       Units  Drug Present and Declared for Prescription Verification   Hydrocodone                    1335         EXPECTED   ng/mg creat   Dihydrocodeine                 306          EXPECTED   ng/mg creat   Norhydrocodone                 1491         EXPECTED   ng/mg creat    Sources of hydrocodone include scheduled prescription medications.    Dihydrocodeine and norhydrocodone are expected metabolites of    hydrocodone. Dihydrocodeine is also available as a scheduled    prescription medication.  ==================================================================== Test                      Result    Flag   Units      Ref Range   Creatinine              34               mg/dL      >=20 ==================================================================== Declared Medications:  The flagging and interpretation on this report are based on the  following declared medications.  Unexpected results may arise from  inaccuracies in the declared medications.   **Note: The testing scope of this panel includes these medications:   Hydrocodone (Norco)   **Note: The testing scope of this panel does not include the  following reported medications:   Acetaminophen (Norco)  Albuterol (Ventolin HFA)  Cyclobenzaprine (Flexeril)  Filgrastim (Zarxio)  Gabapentin (Neurontin)  Hydroxyzine (Atarax)  Loratadine (Claritin)  Losartan (Cozaar)  Melatonin  Meloxicam (Mobic)  Metoprolol (Toprol)  Nortriptyline (Pamelor)  Omeprazole (Prilosec) ==================================================================== For clinical consultation, please call [phone]. ====================================================================      ROS  Constitutional: Denies any fever or chills Gastrointestinal: No reported hemesis, hematochezia, vomiting, or acute GI distress Musculoskeletal: Denies any acute onset joint swelling, redness, loss of ROM, or weakness Neurological: No reported episodes  of acute onset apraxia, aphasia, dysarthria, agnosia, amnesia, paralysis, loss of coordination, or loss of consciousness  Medication Review  HYDROcodone-acetaminophen, albuterol, filgrastim-sndz, gabapentin, loratadine, losartan, meloxicam, metoprolol succinate, nortriptyline, and omeprazole  History Review  Allergy: Brandon Myers is allergic to enalapril, hydrochlorothiazide, prochlorperazine, sulfa antibiotics, benadryl [diphenhydramine], and strawberry extract. Drug: Brandon Myers  reports no history of drug use. Alcohol:  reports no history of alcohol use. Tobacco:  reports that he has never smoked. He has never used smokeless tobacco. Social: Brandon Myers  reports that he has never smoked. He has never used smokeless tobacco. He reports that he does not drink alcohol and does not use drugs. 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 Surgical: Brandon Myers  has a past surgical history that includes Amputation; Appendectomy; Colon surgery (1994); Above elbow arm amputation (Right, 1993); and Leg amputation through knee (Right, 1993). Family: family history includes Alzheimer's disease in his mother; Asthma in his mother; Cancer in his father, maternal grandmother, paternal grandfather, and paternal grandmother; Depression in his mother; Heart Problems in his mother; Hypertension in  his father and mother; Stroke in his maternal grandmother; Thyroid disease in his mother.  Laboratory Chemistry Profile   Renal Lab Results  Component Value Date   BUN 13 12/23/2019   CREATININE 0.86 12/23/2019   BCR 15 12/23/2019   GFRAA 116 12/23/2019   GFRNONAA 100 12/23/2019     Hepatic Lab Results  Component Value Date   AST 67 (H) 12/23/2019   ALT 69 (H) 12/23/2019   ALBUMIN 4.6 12/23/2019   ALKPHOS 141 (H) 12/23/2019      Electrolytes Lab Results  Component Value Date   NA 139 12/23/2019   K 4.6 12/23/2019   CL 99 12/23/2019   CALCIUM 9.4 12/23/2019     Bone No results found for: VD25OH, VD125OH2TOT, NW2956OZ3, YQ6578IO9, 25OHVITD1, 25OHVITD2, 25OHVITD3, TESTOFREE, TESTOSTERONE   Inflammation (CRP: Acute Phase) (ESR: Chronic Phase) No results found for: CRP, ESRSEDRATE, LATICACIDVEN     Note: Above Lab results reviewed.  Recent Imaging Review  DG Neck Soft Tissue CLINICAL DATA:  Esophageal foreign body. Foreign body sensation after eating chicken. Difficulty swallowing.  EXAM: NECK SOFT TISSUES - 1+ VIEW  COMPARISON:  None.  FINDINGS: There is no evidence of retropharyngeal soft tissue swelling or epiglottic enlargement. The cervical airway is unremarkable. No subcutaneous emphysema. Soft tissues are non suspicious. No radio-opaque foreign body identified. Patient is edentulous.  IMPRESSION: Negative soft tissue neck radiographs. No radiopaque foreign body in the neck.  Electronically Signed   By: Keith Rake M.D.   On: 10/27/2018 00:37 Note: Reviewed        Physical Exam  General appearance: Well nourished, well developed, and well hydrated. In no apparent acute distress Mental status: Alert, oriented x 3 (person, place, & time)       Respiratory: No evidence of acute respiratory distress Eyes: PERLA Vitals: BP (!) 147/97   Pulse 77   Temp (!) 97.3 F (36.3 C)   Ht $R'5\' 8"'IM$  (1.727 m)   Wt 195 lb (88.5 kg)   SpO2 98%   BMI 29.65 kg/m  BMI: Estimated body mass index is 29.65 kg/m as calculated from the following:   Height as of this encounter: $RemoveBeforeD'5\' 8"'dOhSfdpHOXwqLN$  (1.727 m).   Weight as of this encounter: 195 lb (88.5 kg). Ideal: Ideal body weight: 68.4 kg (150 lb 12.7 oz) Adjusted ideal body weight: 76.4 kg (168 lb 7.6 oz)  Lumbar Spine Area Exam  Skin & Axial Inspection:No masses, redness, or swelling Alignment:Symmetrical Functional GEX:[id number] ROM Stability:No  instability detected Muscle Tone/Strength:Functionally intact. No obvious neuro-muscular anomalies detected. Sensory (Neurological): Low back pain, musculoskeletal, right greater than left, L4/L5 region.   Gait & Posture Assessment  Ambulation:Limited, prosthesis in place Gait:Antalgic gait (limping) Posture:Difficulty with positional changes  Lower Extremity Exam    Side:Right lower extremity  Side:Left lower extremity  Stability:No instability observed  Stability:No instability observed  Skin & Extremity Inspection:Above knee amputation (AKA)  Skin & Extremity Inspection:Skin color, temperature, and hair growth are WNL. No peripheral edema or cyanosis. No masses, redness, swelling, asymmetry, or associated skin lesions. No contractures.  Functional UVO:ZDGUYQIHK ROM   Functional VQQ:VZDGLOVFIEPP ROM   Muscle Tone/Strength:Functionally intact. No obvious neuro-muscular anomalies detected.  Muscle Tone/Strength:Functionally intact. No obvious neuro-muscular anomalies detected.  Sensory (Neurological):Neuropathic pain pattern  Sensory (Neurological):Unimpaired  Palpation:No palpable anomalies  Palpation:No palpable anomalies     Assessment   Diagnosis  1. 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([place])   2. Chronic pain syndrome   3. Phantom limb pain ([place])   4. Congenital neutropenia (  [place])   5. Chronic pain of right lower extremity   6. Amputation of right upper extremity, sequela ([place])   7. Acute right-sided low back pain without sciatica      Plan of Care   Brandon Myers has a current medication list which includes the following long-term medication(s): gabapentin, loratadine, omeprazole, ventolin hfa, gabapentin, losartan, metoprolol succinate, and nortriptyline.  Continue multimodal analgesics with gabapentin and meloxicam and nortriptyline as prescribed by PCP.  Pharmacotherapy (Medications Ordered): Meds ordered this  encounter  Medications  . HYDROcodone-acetaminophen (NORCO) 10-325 MG tablet    Sig: Take 1-2 tablets by mouth every 12 (twelve) hours as needed for severe pain. Must last 30 days.    Dispense:  60 tablet    Refill:  0    [place] STOP ACT - Not applicable. Fill one day early if pharmacy is closed on scheduled refill date.  Marland Kitchen HYDROcodone-acetaminophen (NORCO) 10-325 MG tablet    Sig: Take 1-2 tablets by mouth every 12 (twelve) hours as needed for severe pain. Must last 30 days.    Dispense:  60 tablet    Refill:  0    [place] STOP ACT - Not applicable. Fill one day early if pharmacy is closed on scheduled refill date.  Marland Kitchen HYDROcodone-acetaminophen (NORCO) 10-325 MG tablet    Sig: Take 1-2 tablets by mouth every 12 (twelve) hours as needed for severe pain. Must last 30 days.    Dispense:  60 tablet    Refill:  0    [place] STOP ACT - Not applicable. Fill one day early if pharmacy is closed on scheduled refill date.    Follow-up plan:   Return in about 3 months (around 12/15/2020) for Medication Management, in person.   Recent Visits No visits were found meeting these conditions. Showing recent visits within past 90 days and meeting all other requirements Today's Visits Date Type Provider Dept  09/10/20 Office Visit [name], MD [facility]  Showing today's visits and meeting all other requirements Future Appointments Date Type Provider Dept  12/08/20 Appointment [name], MD [facility]  Showing future appointments within next 90 days and meeting all other requirements  I discussed the assessment and treatment plan with the patient. The patient was provided an opportunity to ask questions and all were answered. The patient agreed with the plan and demonstrated an understanding of the instructions.  Patient advised to call back or seek an in-person evaluation if the symptoms or condition worsens.  Duration of encounter:30 minutes.  Note by: [name], MD Date:  09/10/2020; Time: 1:58 PM

## 2020-09-10 NOTE — Progress Notes (Signed)
Nursing Pain Medication Assessment:  Safety precautions to be maintained throughout the outpatient stay will include: orient to surroundings, keep bed in low position, maintain call bell within reach at all times, provide assistance with transfer out of bed and ambulation.  Medication Inspection Compliance: Pill count conducted under aseptic conditions, in front of the patient. Neither the pills nor the bottle was removed from the patient's sight at any time. Once count was completed pills were immediately returned to the patient in their original bottle.  Medication: Hydrocodone/APAP Pill/Patch Count: 42 of 60 pills remain Pill/Patch Appearance: Markings consistent with prescribed medication Bottle Appearance: Standard pharmacy container. Clearly labeled. Filled Date: 1 / 30 / 22 Last Medication intake:  YesterdaySafety precautions to be maintained throughout the outpatient stay will include: orient to surroundings, keep bed in low position, maintain call bell within reach at all times, provide assistance with transfer out of bed and ambulation.

## 2020-09-23 ENCOUNTER — Other Ambulatory Visit: Payer: Self-pay | Admitting: Student in an Organized Health Care Education/Training Program

## 2020-09-23 DIAGNOSIS — G546 Phantom limb syndrome with pain: Secondary | ICD-10-CM

## 2020-09-28 ENCOUNTER — Other Ambulatory Visit: Payer: Self-pay | Admitting: Family Medicine

## 2020-09-28 DIAGNOSIS — G546 Phantom limb syndrome with pain: Secondary | ICD-10-CM

## 2020-09-28 NOTE — Telephone Encounter (Signed)
Pt's spouse called in to request a refill for gabapentin (NEURONTIN) 300 MG capsule . She would like to know if Dr. Kirtland Bouchard would fill due to pt's provider no longer with office?   Pharmacy:  [id number] [place], [place] - [address] AT [facility] [address] Phone:  [phone]  Fax:  [phone]       Please assist pt further.

## 2020-09-29 MED ORDER — GABAPENTIN 300 MG PO CAPS: 300.0000 mg | ORAL_CAPSULE | Freq: Every day | ORAL | 0 refills | Status: DC

## 2020-09-29 MED ORDER — GABAPENTIN 800 MG PO TABS: 800.0000 mg | ORAL_TABLET | Freq: Three times a day (TID) | ORAL | 0 refills | Status: DC

## 2020-10-08 DIAGNOSIS — Z89611 Acquired absence of right leg above knee: Secondary | ICD-10-CM | POA: Diagnosis not present

## 2020-10-23 ENCOUNTER — Other Ambulatory Visit: Payer: Self-pay | Admitting: Family Medicine

## 2020-10-23 DIAGNOSIS — K219 Gastro-esophageal reflux disease without esophagitis: Secondary | ICD-10-CM

## 2020-10-23 NOTE — Telephone Encounter (Signed)
   Notes to clinic: Review for refill Medication was last filled by Roosvelt Maser    Requested Prescriptions  Pending Prescriptions Disp Refills   omeprazole (PRILOSEC) 40 MG capsule [Pharmacy Med Name: OMEPRAZOLE 40MG  CAPSULES] 90 capsule 3    Sig: TAKE 1 CAPSULE(40 MG) BY MOUTH DAILY      Gastroenterology: Proton Pump Inhibitors Passed - 10/23/2020 10:20 AM      Passed - Valid encounter within last 12 months    Recent Outpatient Visits           2 months ago Elevated alkaline phosphatase level   [facility], [facility], FNP   4 months ago Encounter to establish care with new doctor   [facility], [facility], FNP   6 months ago COVID-19   [facility], NP   6 months ago COVID-19   [facility], [place], DO   7 months ago Strain of lumbar region, initial encounter   [facility], [place], [place]

## 2020-10-27 DIAGNOSIS — J069 Acute upper respiratory infection, unspecified: Secondary | ICD-10-CM | POA: Diagnosis not present

## 2020-10-27 DIAGNOSIS — R0981 Nasal congestion: Secondary | ICD-10-CM | POA: Diagnosis not present

## 2020-10-27 DIAGNOSIS — R059 Cough, unspecified: Secondary | ICD-10-CM | POA: Diagnosis not present

## 2020-10-27 DIAGNOSIS — J039 Acute tonsillitis, unspecified: Secondary | ICD-10-CM | POA: Diagnosis not present

## 2020-10-27 DIAGNOSIS — Z03818 Encounter for observation for suspected exposure to other biological agents ruled out: Secondary | ICD-10-CM | POA: Diagnosis not present

## 2020-10-30 ENCOUNTER — Encounter: Payer: Self-pay | Admitting: Internal Medicine

## 2020-11-02 ENCOUNTER — Other Ambulatory Visit: Payer: Self-pay

## 2020-11-02 DIAGNOSIS — K219 Gastro-esophageal reflux disease without esophagitis: Secondary | ICD-10-CM

## 2020-11-02 MED ORDER — OMEPRAZOLE 40 MG PO CPDR: DELAYED_RELEASE_CAPSULE | ORAL | 0 refills | Status: DC

## 2020-11-12 ENCOUNTER — Other Ambulatory Visit: Payer: Self-pay | Admitting: Family Medicine

## 2020-11-12 NOTE — Telephone Encounter (Signed)
This not a patient of [facility] please route accordingly.

## 2020-11-12 NOTE — Telephone Encounter (Signed)
Requested medication (s) are due for refill today: no  Requested medication (s) are on the active medication list: yes  Last refill: 05/14/2020  Future visit scheduled: yes  Notes to clinic:  medication was last filled on 05/14/2020 Review for refill  Patient should have been out in 08/2020   Requested Prescriptions  Pending Prescriptions Disp Refills   metoprolol succinate (TOPROL-XL) 100 MG 24 hr tablet [Pharmacy Med Name: METOPROLOL ER SUCCINATE 100MG  TABS] 90 tablet 0    Sig: TAKE 1 TABLET BY MOUTH EVERY DAY WITH OR IMMEDIATELY FOLLOWING A MEAL      Cardiovascular:  Beta Blockers Failed - 11/12/2020 10:54 AM      Failed - Last BP in normal range    BP Readings from Last 1 Encounters:  09/10/20 (!) 147/97          Passed - Last Heart Rate in normal range    Pulse Readings from Last 1 Encounters:  09/10/20 77          Passed - Valid encounter within last 6 months    Recent Outpatient Visits           3 months ago Elevated alkaline phosphatase level   [facility], [facility], FNP   4 months ago Encounter to establish care with new doctor   [facility], [facility], FNP   6 months ago COVID-19   [facility], NP   7 months ago COVID-19   [facility], [place], DO   8 months ago Strain of lumbar region, initial encounter   [facility] [place], [place], [place]       Future Appointments             In 2 months Ba[place], [place], NP [facility], [facility]

## 2020-12-08 ENCOUNTER — Encounter: Payer: Self-pay | Admitting: Student in an Organized Health Care Education/Training Program

## 2020-12-08 ENCOUNTER — Other Ambulatory Visit: Payer: Self-pay

## 2020-12-08 ENCOUNTER — Ambulatory Visit
Payer: Medicare Other | Attending: Student in an Organized Health Care Education/Training Program | Admitting: Student in an Organized Health Care Education/Training Program

## 2020-12-08 VITALS — BP 145/100 | HR 98 | Temp 96.8°F | Resp 18 | Ht 68.0 in | Wt 195.0 lb

## 2020-12-08 DIAGNOSIS — G8929 Other chronic pain: Secondary | ICD-10-CM | POA: Insufficient documentation

## 2020-12-08 DIAGNOSIS — S88919A Complete traumatic amputation of unspecified lower leg, level unspecified, initial encounter: Secondary | ICD-10-CM | POA: Insufficient documentation

## 2020-12-08 DIAGNOSIS — S48911S Complete traumatic amputation of right shoulder and upper arm, level unspecified, sequela: Secondary | ICD-10-CM | POA: Insufficient documentation

## 2020-12-08 DIAGNOSIS — D700 Congenital agranulocytosis: Secondary | ICD-10-CM

## 2020-12-08 DIAGNOSIS — G894 Chronic pain syndrome: Secondary | ICD-10-CM | POA: Diagnosis not present

## 2020-12-08 DIAGNOSIS — M79604 Pain in right leg: Secondary | ICD-10-CM | POA: Insufficient documentation

## 2020-12-08 DIAGNOSIS — G546 Phantom limb syndrome with pain: Secondary | ICD-10-CM | POA: Diagnosis not present

## 2020-12-08 MED ORDER — HYDROCODONE-ACETAMINOPHEN 10-325 MG PO TABS: 1.0000 | ORAL_TABLET | Freq: Two times a day (BID) | ORAL | 0 refills | Status: AC | PRN

## 2020-12-08 MED ORDER — HYDROCODONE-ACETAMINOPHEN 10-325 MG PO TABS: 1.0000 | ORAL_TABLET | Freq: Two times a day (BID) | ORAL | 0 refills | Status: DC | PRN

## 2020-12-08 NOTE — Progress Notes (Signed)
PROVIDER NOTE: Information contained herein reflects review and annotations entered in association with encounter. Interpretation of such information and data should be left to medically-trained personnel. Information provided to patient can be located elsewhere in the medical record under "Patient Instructions". Document created using STT-dictation technology, any transcriptional errors that may result from process are unintentional.    Patient: Brandon Myers  Service Category: E/M  Provider: [name], MD  DOB: [birth year]  DOS: 12/08/2020  Specialty: Interventional Pain Management  MRN: [id number]  Setting: Ambulatory outpatient  PCP: Brandon Bangs, FNP  Type: Established Patient    Referring Provider: Verl Bangs, FNP  Location: Office  Delivery: Face-to-face     HPI  [name], a 55 y.o. 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 Mr. Minton primary complain today is Leg Pain Last encounter: My last encounter with him was on 09/10/2020. 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 Pain Assessment: Severity of Chronic pain,Phantom pain is reported as a 8 /10. Location: Leg Right/Phamtom pain right side. Onset: More than a month ago. Quality: Constant,Aching. Timing: Constant. Modifying factor(s): Sitting down, pain medication, resting. Vitals:  height is $RemoveB'5\' 8"'ZmTQyMGC$  (1.727 m) and weight is 195 lb (88.5 kg). His temporal temperature is 96.8 F (36 C) (abnormal). His blood pressure is 145/100 (abnormal) and his pulse is 98. His respiration is 18 and oxygen saturation is 99%.   Reason for encounter: medication management.    No change in medical history since last visit.   Patient's pain is at baseline.  Patient continues multimodal pain regimen as prescribed.  States that it provides pain relief and improvement in functional status. Utilizes MiraLAX as needed for constipation  Pharmacotherapy Assessment   Analgesic: Hydrocodone 10 mg every 12 hours as needed, quantity 60/month    Monitoring: [place] PMP: PDMP not reviewed this encounter.       Pharmacotherapy: No side-effects or adverse reactions reported. Compliance: No problems identified. Effectiveness: Clinically acceptable.  Brandon Napoleon, RN  12/08/2020  1:13 PM  Sign when Signing Visit Safety precautions to be maintained throughout the outpatient stay will include: orient to surroundings, keep bed in low position, maintain call bell within reach at all times, provide assistance with transfer out of bed and ambulation.   Nursing Pain Medication Assessment:  Safety precautions to be maintained throughout the outpatient stay will include: orient to surroundings, keep bed in low position, maintain call bell within reach at all times, provide assistance with transfer out of bed and ambulation.  Medication Inspection Compliance: Pill count conducted under aseptic conditions, in front of the patient. Neither the pills nor the bottle was removed from the patient's sight at any time. Once count was completed pills were immediately returned to the patient in their original bottle.  Medication: Hydrocodone/APAP Pill/Patch Count: 46 of 60 pills remain Pill/Patch Appearance: Markings consistent with prescribed medication Bottle Appearance: Standard pharmacy container. Clearly labeled. Filled Date: 05 / 02 / 2022 Last Medication intake:  Yesterday    UDS:  Summary  Date Value Ref Range Status  03/19/2020 Note  Final    Comment:    ==================================================================== [facility] 13 (MW) ==================================================================== Test  Result       Flag       Units  Drug Present and Declared for Prescription Verification   Hydrocodone                    1335         EXPECTED   ng/mg creat   Dihydrocodeine                 306          EXPECTED   ng/mg creat   Norhydrocodone                 1491         EXPECTED   ng/mg creat    Sources of hydrocodone include scheduled prescription medications.    Dihydrocodeine and norhydrocodone are expected metabolites of    hydrocodone. Dihydrocodeine is also available as a scheduled    prescription medication.  ==================================================================== Test                      Result    Flag   Units      Ref Range   Creatinine              34               mg/dL      >=20 ==================================================================== Declared Medications:  The flagging and interpretation on this report are based on the  following declared medications.  Unexpected results may arise from  inaccuracies in the declared medications.   **Note: The testing scope of this panel includes these medications:   Hydrocodone (Norco)   **Note: The testing scope of this panel does not include the  following reported medications:   Acetaminophen (Norco)  Albuterol (Ventolin HFA)  Cyclobenzaprine (Flexeril)  Filgrastim (Zarxio)  Gabapentin (Neurontin)  Hydroxyzine (Atarax)  Loratadine (Claritin)  Losartan (Cozaar)  Melatonin  Meloxicam (Mobic)  Metoprolol (Toprol)  Nortriptyline (Pamelor)  Omeprazole (Prilosec) ==================================================================== For clinical consultation, please call [phone]. ====================================================================      ROS  Constitutional: Denies any fever or chills Gastrointestinal: No reported hemesis, hematochezia, vomiting, or acute GI distress Musculoskeletal: Denies any acute onset joint swelling, redness, loss of ROM, or  weakness Neurological: No reported episodes of acute onset apraxia, aphasia, dysarthria, agnosia, amnesia, paralysis, loss of coordination, or loss of consciousness  Medication Review  HYDROcodone-acetaminophen, albuterol, filgrastim-sndz, gabapentin, loratadine, losartan, meloxicam, metoprolol succinate, nortriptyline, and omeprazole  History Review  Allergy: Brandon Myers is allergic to enalapril, hydrochlorothiazide, prochlorperazine, sulfa antibiotics, benadryl [diphenhydramine], and strawberry extract. Drug: Brandon Myers  reports no history of drug use. Alcohol:  reports no history of alcohol use. Tobacco:  reports that he has never smoked. He has never used smokeless tobacco. Social: Brandon Myers  reports that he has never smoked. He has never used smokeless tobacco. He reports that he does not drink alcohol and does not use drugs. Medical:  has a past medical history of Allergy, Anxiety (01/21/2020), Controlled substance agreement signed (04/02/2017), GERD (gastroesophageal reflux disease), Hypertension, Neutropenia, congenital ([place]), and Phantom limb pain ([place]). Surgical: Brandon Myers  has a past surgical history that includes Amputation; Appendectomy; Colon surgery (1994); Above elbow arm amputation (Right, 1993); and Leg amputation through knee (Right, 1993). Family: family history includes Alzheimer's disease in his mother; Asthma in his mother; Cancer in his father, maternal grandmother, paternal grandfather, and paternal grandmother; Depression in his mother; Heart Problems in his mother; Hypertension in his  father and mother; Stroke in his maternal grandmother; Thyroid disease in his mother.  Laboratory Chemistry Profile   Renal Lab Results  Component Value Date   BUN 13 12/23/2019   CREATININE 0.86 12/23/2019   BCR 15 12/23/2019   GFRAA 116 12/23/2019   GFRNONAA 100 12/23/2019     Hepatic Lab Results  Component Value Date   AST 67 (H) 12/23/2019   ALT 69 (H) 12/23/2019   ALBUMIN 4.6  12/23/2019   ALKPHOS 141 (H) 12/23/2019     Electrolytes Lab Results  Component Value Date   NA 139 12/23/2019   K 4.6 12/23/2019   CL 99 12/23/2019   CALCIUM 9.4 12/23/2019     Bone No results found for: VD25OH, VD125OH2TOT, DQ2229NL8, XQ1194RD4, 25OHVITD1, 25OHVITD2, 25OHVITD3, TESTOFREE, TESTOSTERONE   Inflammation (CRP: Acute Phase) (ESR: Chronic Phase) No results found for: CRP, ESRSEDRATE, LATICACIDVEN     Note: Above Lab results reviewed.  Recent Imaging Review  DG Neck Soft Tissue CLINICAL DATA:  Esophageal foreign body. Foreign body sensation after eating chicken. Difficulty swallowing.  EXAM: NECK SOFT TISSUES - 1+ VIEW  COMPARISON:  None.  FINDINGS: There is no evidence of retropharyngeal soft tissue swelling or epiglottic enlargement. The cervical airway is unremarkable. No subcutaneous emphysema. Soft tissues are non suspicious. No radio-opaque foreign body identified. Patient is edentulous.  IMPRESSION: Negative soft tissue neck radiographs. No radiopaque foreign body in the neck.  Electronically Signed   By: Keith Rake M.D.   On: 10/27/2018 00:37 Note: Reviewed        Physical Exam  General appearance: Well nourished, well developed, and well hydrated. In no apparent acute distress Mental status: Alert, oriented x 3 (person, place, & time)       Respiratory: No evidence of acute respiratory distress Eyes: PERLA Vitals: BP (!) 145/100   Pulse 98   Temp (!) 96.8 F (36 C) (Temporal)   Resp 18   Ht $R'5\' 8"'gT$  (1.727 m)   Wt 195 lb (88.5 kg)   SpO2 99%   BMI 29.65 kg/m  BMI: Estimated body mass index is 29.65 kg/m as calculated from the following:   Height as of this encounter: $RemoveBeforeD'5\' 8"'WrSwzWgnqgbXML$  (1.727 m).   Weight as of this encounter: 195 lb (88.5 kg). Ideal: Ideal body weight: 68.4 kg (150 lb 12.7 oz) Adjusted ideal body weight: 76.4 kg (168 lb 7.6 oz)  Lumbar Spine Area Exam  Skin & Axial Inspection:No masses, redness, or  swelling Alignment:Symmetrical Functional YCX:KGYJEHUDJSHF ROM Stability:No instability detected Muscle Tone/Strength:Functionally intact. No obvious neuro-muscular anomalies detected. Sensory (Neurological): Low back pain, musculoskeletal, right greater than left, L4/L5 region.   Gait & Posture Assessment  Ambulation:Limited, prosthesis in place Gait:Antalgic gait (limping) Posture:Difficulty with positional changes  Lower Extremity Exam    Side:Right lower extremity  Side:Left lower extremity  Stability:No instability observed  Stability:No instability observed  Skin & Extremity Inspection:Above knee amputation (AKA)  Skin & Extremity Inspection:Skin color, temperature, and hair growth are WNL. No peripheral edema or cyanosis. No masses, redness, swelling, asymmetry, or associated skin lesions. No contractures.  Functional WYO:VZCHYIFOY ROM   Functional DXA:JOINOMVEHMCN ROM   Muscle Tone/Strength:Functionally intact. No obvious neuro-muscular anomalies detected.  Muscle Tone/Strength:Functionally intact. No obvious neuro-muscular anomalies detected.  Sensory (Neurological):Neuropathic pain pattern  Sensory (Neurological):Unimpaired  Palpation:No palpable anomalies  Palpation:No palpable anomalies   Assessment   Diagnosis  1. 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 Chronic pain syndrome   3. Phantom limb pain ([place])   4.  Congenital neutropenia ([facility])   5. Chronic pain of right lower extremity   6. Amputation of right upper extremity, sequela ([place])      Plan of Care   Brandon Myers has a current medication list which includes the following long-term medication(s): gabapentin, gabapentin, loratadine, losartan, metoprolol succinate, nortriptyline, omeprazole, and ventolin hfa.  Continue multimodal analgesics with gabapentin and meloxicam and nortriptyline as prescribed by PCP.  Pharmacotherapy (Medications  Ordered): Meds ordered this encounter  Medications  . HYDROcodone-acetaminophen (NORCO) 10-325 MG tablet    Sig: Take 1-2 tablets by mouth every 12 (twelve) hours as needed for severe pain. Must last 30 days.    Dispense:  60 tablet    Refill:  0    [place] STOP ACT - Not applicable. Fill one day early if pharmacy is closed on scheduled refill date.  Marland Kitchen HYDROcodone-acetaminophen (NORCO) 10-325 MG tablet    Sig: Take 1-2 tablets by mouth every 12 (twelve) hours as needed for severe pain. Must last 30 days.    Dispense:  60 tablet    Refill:  0    [place] STOP ACT - Not applicable. Fill one day early if pharmacy is closed on scheduled refill date.  Marland Kitchen HYDROcodone-acetaminophen (NORCO) 10-325 MG tablet    Sig: Take 1-2 tablets by mouth every 12 (twelve) hours as needed for severe pain. Must last 30 days.    Dispense:  60 tablet    Refill:  0    [place] STOP ACT - Not applicable. Fill one day early if pharmacy is closed on scheduled refill date.    Follow-up plan:   Return in about 4 months (around 03/25/2021) for Medication Management, in person.   Recent Visits Date Type Provider Dept  09/10/20 Office Visit Brandon Santa, MD [facility]  Showing recent visits within past 90 days and meeting all other requirements Today's Visits Date Type Provider Dept  12/08/20 Office Visit Brandon Santa, MD [facility]  Showing today's visits and meeting all other requirements Future Appointments No visits were found meeting these conditions. Showing future appointments within next 90 days and meeting all other requirements  I discussed the assessment and treatment plan with the patient. The patient was provided an opportunity to ask questions and all were answered. The patient agreed with the plan and demonstrated an understanding of the instructions.  Patient advised to call back or seek an in-person evaluation if the symptoms or condition worsens.  Duration of encounter:30 minutes.  Note  by: Brandon Santa, MD Date: 12/08/2020; Time: 1:26 PM

## 2020-12-08 NOTE — Progress Notes (Signed)
Safety precautions to be maintained throughout the outpatient stay will include: orient to surroundings, keep bed in low position, maintain call bell within reach at all times, provide assistance with transfer out of bed and ambulation.   Nursing Pain Medication Assessment:  Safety precautions to be maintained throughout the outpatient stay will include: orient to surroundings, keep bed in low position, maintain call bell within reach at all times, provide assistance with transfer out of bed and ambulation.  Medication Inspection Compliance: Pill count conducted under aseptic conditions, in front of the patient. Neither the pills nor the bottle was removed from the patient's sight at any time. Once count was completed pills were immediately returned to the patient in their original bottle.  Medication: Hydrocodone/APAP Pill/Patch Count: 46 of 60 pills remain Pill/Patch Appearance: Markings consistent with prescribed medication Bottle Appearance: Standard pharmacy container. Clearly labeled. Filled Date: 05 / 02 / 2022 Last Medication intake:  Yesterday

## 2020-12-10 DIAGNOSIS — M79672 Pain in left foot: Secondary | ICD-10-CM | POA: Diagnosis not present

## 2020-12-10 DIAGNOSIS — Z89611 Acquired absence of right leg above knee: Secondary | ICD-10-CM | POA: Diagnosis not present

## 2020-12-10 DIAGNOSIS — Z1159 Encounter for screening for other viral diseases: Secondary | ICD-10-CM | POA: Diagnosis not present

## 2020-12-10 DIAGNOSIS — D700 Congenital agranulocytosis: Secondary | ICD-10-CM | POA: Diagnosis not present

## 2020-12-10 DIAGNOSIS — Z0000 Encounter for general adult medical examination without abnormal findings: Secondary | ICD-10-CM | POA: Diagnosis not present

## 2020-12-10 DIAGNOSIS — I10 Essential (primary) hypertension: Secondary | ICD-10-CM | POA: Diagnosis not present

## 2020-12-10 DIAGNOSIS — K219 Gastro-esophageal reflux disease without esophagitis: Secondary | ICD-10-CM | POA: Diagnosis not present

## 2020-12-10 LAB — HIV ANTIBODY (ROUTINE TESTING W REFLEX): HIV 1&2 Ab, 4th Generation: NEGATIVE

## 2020-12-16 ENCOUNTER — Telehealth: Payer: Self-pay | Admitting: Family Medicine

## 2020-12-16 NOTE — Telephone Encounter (Signed)
Copied from CRM [id number]. Topic: Medicare AWV >> Dec 16, 2020  3:21 PM Claudette Laws R wrote: Reason for CRM: Left message for patient to call back and schedule the Medicare Annual Wellness Visit (AWV) virtually or by telephone.  Last AWV 11/11/2019  Please schedule at anytime with [facility] Advisor.  40 minute appointment  Any questions, please call me at [phone]

## 2021-01-11 ENCOUNTER — Ambulatory Visit (INDEPENDENT_AMBULATORY_CARE_PROVIDER_SITE_OTHER): Payer: Medicare Other | Admitting: Internal Medicine

## 2021-01-11 ENCOUNTER — Other Ambulatory Visit: Payer: Self-pay

## 2021-01-11 ENCOUNTER — Encounter: Payer: Self-pay | Admitting: Internal Medicine

## 2021-01-11 DIAGNOSIS — S48911S Complete traumatic amputation of right shoulder and upper arm, level unspecified, sequela: Secondary | ICD-10-CM

## 2021-01-11 DIAGNOSIS — D700 Congenital agranulocytosis: Secondary | ICD-10-CM

## 2021-01-11 DIAGNOSIS — G894 Chronic pain syndrome: Secondary | ICD-10-CM

## 2021-01-11 DIAGNOSIS — K5903 Drug induced constipation: Secondary | ICD-10-CM | POA: Diagnosis not present

## 2021-01-11 DIAGNOSIS — I10 Essential (primary) hypertension: Secondary | ICD-10-CM | POA: Diagnosis not present

## 2021-01-11 DIAGNOSIS — F419 Anxiety disorder, unspecified: Secondary | ICD-10-CM

## 2021-01-11 DIAGNOSIS — S88919A Complete traumatic amputation of unspecified lower leg, level unspecified, initial encounter: Secondary | ICD-10-CM

## 2021-01-11 DIAGNOSIS — E781 Pure hyperglyceridemia: Secondary | ICD-10-CM

## 2021-01-11 DIAGNOSIS — G546 Phantom limb syndrome with pain: Secondary | ICD-10-CM

## 2021-01-11 DIAGNOSIS — K219 Gastro-esophageal reflux disease without esophagitis: Secondary | ICD-10-CM | POA: Diagnosis not present

## 2021-01-11 NOTE — Assessment & Plan Note (Signed)
Currently not an issue off meds Support offered 

## 2021-01-11 NOTE — Assessment & Plan Note (Signed)
We will check lipid panel at annual exam Encouraged him to consume a low-fat diet

## 2021-01-11 NOTE — Assessment & Plan Note (Signed)
Has prosthesis Independent with ADLs at this time

## 2021-01-11 NOTE — Assessment & Plan Note (Signed)
With history of esophageal stricture Continue Omeprazole daily

## 2021-01-11 NOTE — Assessment & Plan Note (Signed)
He will continue Amitriptyline, Hydrocodone and Gabapentin per pain management 

## 2021-01-11 NOTE — Assessment & Plan Note (Signed)
Independent for ADLs at this time

## 2021-01-11 NOTE — Assessment & Plan Note (Signed)
He will continue laxatives OTC Encourage adequate water intake and high-fiber diet

## 2021-01-11 NOTE — Assessment & Plan Note (Signed)
He will continue Filgrastim as prescribed by hematology, will follow

## 2021-01-11 NOTE — Assessment & Plan Note (Signed)
He will continue Amitriptyline, Hydrocodone and Gabapentin per pain management

## 2021-01-11 NOTE — Assessment & Plan Note (Signed)
Slightly elevated today Reinforced DASH diet Unable to exercise given history of amputation Kidney function reviewed

## 2021-01-11 NOTE — Patient Instructions (Signed)
Cooking With Less Salt Cooking with less salt is one way to reduce the amount of sodium you get from food. Sodium is one of the elements that make up salt. It is found naturally in foods and is also added to certain foods. Depending on your condition and overall health, your health care provider or dietitian may recommend that you reduce your sodium intake. Most people should have less than 2,300 milligrams (mg) of sodium each day. If you have high blood pressure (hypertension), you may need to limit your sodium to 1,500 mg each day. Follow the tipsbelow to help reduce your sodium intake. What are tips for eating less sodium? Reading food labels  Check the food label before buying or using packaged ingredients. Always check the label for the serving size and sodium content. Look for products with no more than 140 mg of sodium in one serving. Check the % Daily Value column to see what percent of the daily recommended amount of sodium is provided in one serving of the product. Foods with 5% or less in this column are considered low in sodium. Foods with 20% or higher are considered high in sodium. Do not choose foods with salt as one of the first three ingredients on the ingredients list. If salt is one of the first three ingredients, it usually means the item is high in sodium.  Shopping Buy sodium-free or low-sodium products. Look for the following words on food labels: Low-sodium. Sodium-free. Reduced-sodium. No salt added. Unsalted. Always check the sodium content even if foods are labeled as low-sodium or no salt added. Buy fresh foods. Cooking Use herbs, seasonings without salt, and spices as substitutes for salt. Use sodium-free baking soda when baking. Grill, braise, or roast foods to add flavor with less salt. Avoid adding salt to pasta, rice, or hot cereals. Drain and rinse canned vegetables, beans, and meat before use. Avoid adding salt when cooking sweets and desserts. Cook with  low-sodium ingredients. What foods are high in sodium? Vegetables Regular canned vegetables (not low-sodium or reduced-sodium). Sauerkraut, pickled vegetables, and relishes. Olives. French fries. Onion rings. Regular canned tomato sauce and paste. Regular tomato and vegetable juice. Frozenvegetables in sauces. Grains Instant hot cereals. Bread stuffing, pancake, and biscuit mixes. Croutons. Seasoned rice or pasta mixes. Noodle soup cups. Boxed or frozen macaroni and cheese. Regular salted crackers. Self-rising flour. Rolls. Bagels. Flourtortillas and wraps. Meats and other proteins Meat or fish that is salted, canned, smoked, cured, spiced, or pickled. This includes bacon, ham, sausages, hot dogs, corned beef, chipped beef, meat loaves, salt pork, jerky, pickled herring, anchovies, regular canned tuna, andsardines. Salted nuts. Dairy Processed cheese and cheese spreads. Cheese curds. Blue cheese. Feta cheese.String cheese. Regular cottage cheese. Buttermilk. Canned milk. The items listed above may not be a complete list of foods high in sodium. Actual amounts of sodium may be different depending on processing. Contact a dietitian for more information. What foods are low in sodium? Fruits Fresh, frozen, or canned fruit with no sauce added. Fruit juice. Vegetables Fresh or frozen vegetables with no sauce added. "No salt added" canned vegetables. "No salt added" tomato sauce and paste. Low-sodium orreduced-sodium tomato and vegetable juice. Grains Noodles, pasta, quinoa, rice. Shredded or puffed wheat or puffed rice. Regular or quick oats (not instant). Low-sodium crackers. Low-sodium bread. Whole-grainbread and whole-grain pasta. Unsalted popcorn. Meats and other proteins Fresh or frozen whole meats, poultry (not injected with sodium), and fish with no sauce added. Unsalted nuts. Dried peas, beans, and   lentils without added salt. Unsalted canned beans. Eggs. Unsalted nut butters. Low-sodium canned  tunaor chicken. Dairy Milk. Soy milk. Yogurt. Low-sodium cheeses, such as Swiss, Monterey Jack, mozzarella, and ricotta. Sherbet or ice cream (keep to  cup per serving).Cream cheese. Fats and oils Unsalted butter or margarine. Other foods Homemade pudding. Sodium-free baking soda and baking powder. Herbs and spices.Low-sodium seasoning mixes. Beverages Coffee and tea. Carbonated beverages. The items listed above may not be a complete list of foods low in sodium. Actual amounts of sodium may be different depending on processing. Contact a dietitian for more information. What are some salt alternatives when cooking? The following are herbs, seasonings, and spices that can be used instead of salt to flavor your food. Herbs should be fresh or dried. Do not choose packaged mixes. Next to the name of the herb, spice, or seasoning aresome examples of foods you can pair it with. Herbs [place] leaves - Soups, meat and vegetable dishes, and spaghetti sauce. Basil - Italian dishes, soups, pasta, and fish dishes. Cilantro - Meat, poultry, and vegetable dishes. Chili powder - Marinades and Mexican dishes. Chives - Salad dressings and potato dishes. Cumin - Mexican dishes, couscous, and meat dishes. Dill - Fish dishes, sauces, and salads. Fennel - Meat and vegetable dishes, breads, and cookies. Garlic (do not use garlic salt) - Italian dishes, meat dishes, salad dressings, and sauces. Marjoram - Soups, potato dishes, and meat dishes. Oregano - Pizza and spaghetti sauce. Parsley - Salads, soups, pasta, and meat dishes. Rosemary - Italian dishes, salad dressings, soups, and red meats. Saffron - Fish dishes, pasta, and some poultry dishes. Sage - Stuffings and sauces. Tarragon - Fish and poultry dishes. Thyme - Stuffing, meat, and fish dishes. Seasonings Lemon juice - Fish dishes, poultry dishes, vegetables, and salads. Vinegar - Salad dressings, vegetables, and fish dishes. Spices Cinnamon - Sweet  dishes, such as cakes, cookies, and puddings. Cloves - Gingerbread, puddings, and marinades for meats. Curry - Vegetable dishes, fish and poultry dishes, and stir-fry dishes. Ginger - Vegetable dishes, fish dishes, and stir-fry dishes. Nutmeg - Pasta, vegetables, poultry, fish dishes, and custard. Summary Cooking with less salt is one way to reduce the amount of sodium that you get from food. Buy sodium-free or low-sodium products. Check the food label before using or buying packaged ingredients. Use herbs, seasonings without salt, and spices as substitutes for salt in foods. This information is not intended to replace advice given to you by your health care provider. Make sure you discuss any questions you have with your healthcare provider. Document Revised: 07/10/2019 Document Reviewed: 07/10/2019 Elsevier Patient Education  2022 [employer]  

## 2021-01-11 NOTE — Progress Notes (Signed)
Subjective:    Patient ID: Brandon Myers, male    DOB: [birth year], 55 y.o.   MRN: [id number]  HPI  Patient presents the clinic today for follow-up of chronic conditions.  He is establishing care with me today, transferring care from Malva Cogan, NP.  HTN: His BP today is 14/83.  He is taking Losartan and Metoprolol as prescribed.  ECG from 09/2018 reviewed.  HLD: His last LDL was 61, triglycerides 867, 11/2020.  He is not taking any cholesterol-lowering medication at this time.  He does not consume a low-fat diet.  GERD: He is not sure what triggers this. History of esophageal stricture. He denies breakthrough on Omeprazole.  There is no upper GI on file.  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 Managed on Hydrocodone, Nortriptyline and Gabapentin.  He follows with pain management.  Anxiety: Not currently an issue. He does not see a therapist. He denies depression, SI/HI.  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  There is no colonoscopy on file.  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 He is taking Filgrastim as prescribed. He follows with hematology.  Review of Systems  Past Medical History:  Diagnosis Date   Allergy    Anxiety 01/21/2020   Controlled substance agreement signed 04/02/2017   GERD (gastroesophageal reflux disease)    Hypertension    Neutropenia, congenital (HCC)    Phantom limb pain (HCC)     Current Outpatient Medications  Medication Sig Dispense Refill   filgrastim-sndz (ZARXIO) 480 MCG/0.8ML SOSY injection Inject 0.8 mLs into the muscle every other day.     gabapentin (NEURONTIN) 300 MG capsule Take 1 capsule (300 mg total) by mouth daily. 90 capsule 0   gabapentin (NEURONTIN) 800 MG tablet Take 1 tablet (800 mg total) by mouth 3 (three) times daily. 270 tablet 0   HYDROcodone-acetaminophen (NORCO) 10-325 MG tablet Take 1-2 tablets by mouth every 12  (twelve) hours as needed for severe pain. Must last 30 days. 60 tablet 0   [START ON 01/29/2021] HYDROcodone-acetaminophen (NORCO) 10-325 MG tablet Take 1-2 tablets by mouth every 12 (twelve) hours as needed for severe pain. Must last 30 days. 60 tablet 0   [START ON 02/28/2021] HYDROcodone-acetaminophen (NORCO) 10-325 MG tablet Take 1-2 tablets by mouth every 12 (twelve) hours as needed for severe pain. Must last 30 days. 60 tablet 0   loratadine (CLARITIN) 10 MG tablet TAKE 1 TABLET(10 MG) BY MOUTH DAILY 90 tablet 3   losartan (COZAAR) 100 MG tablet TAKE 1 TABLET BY MOUTH EVERY DAY 90 tablet 1   meloxicam (MOBIC) 7.5 MG tablet Take 1 tablet (7.5 mg total) by mouth daily. 90 tablet 1   metoprolol succinate (TOPROL-XL) 100 MG 24 hr tablet TAKE 1 TABLET BY MOUTH EVERY DAY WITH OR IMMEDIATELY FOLLOWING A MEAL 90 tablet 0   nortriptyline (PAMELOR) 25 MG capsule TAKE 1 CAPSULE(25 MG) BY MOUTH AT BEDTIME 90 capsule 1   omeprazole (PRILOSEC) 40 MG capsule TAKE 1 CAPSULE(40 MG) BY MOUTH DAILY 90 capsule 0   VENTOLIN HFA 108 (90 Base) MCG/ACT inhaler INHALE 2 PUFFS BY MOUTH FOUR TIMES DAILY AS NEEDED 18 g 12   No current facility-administered medications for this visit.    Allergies  Allergen Reactions   Enalapril Cough    ACEi Cough   Hydrochlorothiazide     Sweats, hot flashes   Prochlorperazine     Other reaction(s): Other (See  Comments) As a child unable to recall   Sulfa Antibiotics     Other reaction(s): Other (See Comments) As a child unable to recall   Benadryl [Diphenhydramine]     Increased phantom pain   Strawberry Extract Rash    Family History  Problem Relation Age of Onset   Heart Problems Mother    Asthma Mother    Depression Mother    Thyroid disease Mother    Alzheimer's disease Mother    Hypertension Mother    Stroke Maternal Grandmother    Cancer Maternal Grandmother    Cancer Father        skin   Hypertension Father    Cancer Paternal Grandmother    Cancer  Paternal Grandfather     Social History   Socioeconomic History   Marital status: Married    Spouse name: Not on file   Number of children: Not on file   Years of education: Not on file   Highest education level: Associate degree: academic program  Occupational History   Not on file  Tobacco Use   Smoking status: Never   Smokeless tobacco: Never  Vaping Use   Vaping Use: Never used  Substance and Sexual Activity   Alcohol use: Never    Alcohol/week: 0.0 standard drinks   Drug use: Never   Sexual activity: Never  Other Topics Concern   Not on file  Social History Narrative   Not on file   Social Determinants of Health   Financial Resource Strain: Not on file  Food Insecurity: Not on file  Transportation Needs: Not on file  Physical Activity: Not on file  Stress: Not on file  Social Connections: Not on file  Intimate Partner Violence: Not on file     Constitutional: Denies fever, malaise, fatigue, headache or abrupt weight changes.  HEENT: Denies eye pain, eye redness, ear pain, ringing in the ears, wax buildup, runny nose, nasal congestion, bloody nose, or sore throat. Respiratory: Denies difficulty breathing, shortness of breath, cough or sputum production.   Cardiovascular: Denies chest pain, chest tightness, palpitations or swelling in the hands or feet.  Gastrointestinal: Patient reports constipation.  Denies abdominal pain, bloating, diarrhea or blood in the stool.  GU: Denies urgency, frequency, pain with urination, burning sensation, blood in urine, odor or discharge. Musculoskeletal: Patient reports chronic pain.  Denies decrease in range of motion, difficulty with gait, or joint swelling.  Skin: Denies redness, rashes, lesions or ulcercations.  Neurological: Patient reports phantom limb syndrome.  Denies dizziness, difficulty with memory, difficulty with speech or problems with balance and coordination.  Psych: Patient has a history of anxiety.  Denies  depression, SI/HI.  No other specific complaints in a complete review of systems (except as listed in HPI above).     Objective:   Physical Exam  BP (!) 146/83 (BP Location: Left Arm, Patient Position: Sitting, Cuff Size: Large)   Pulse 88   Temp 97.9 F (36.6 C) (Oral)   Resp 17   Ht 5\' 8"  (1.727 m)   Wt 207 lb (93.9 kg)   SpO2 99%   BMI 31.47 kg/m   Wt Readings from Last 3 Encounters:  12/08/20 195 lb (88.5 kg)  09/10/20 195 lb (88.5 kg)  08/07/20 199 lb 12.8 oz (90.6 kg)    General: Appears his stated age, obese, in NAD. Skin: Warm, dry and intact. No ulcerations noted. HEENT: Head: normal shape and size; Eyes: sclera white and EOMs intact;  Cardiovascular: Normal rate  and rhythm. S1,S2 noted.  No murmur, rubs or gallops noted. Trace edema of the LLE. Pulmonary/Chest: Normal effort and positive vesicular breath sounds. No respiratory distress. No wheezes, rales or ronchi noted.  Abdomen: Soft and nontender. Normal bowel sounds.  Musculoskeletal: Amputation of right arm and right leg. Gait slow and steady without device. Neurological: Alert and oriented.  Psychiatric: Mood and affect normal. Behavior is normal. Judgment and thought content normal.     BMET    Component Value Date/Time   NA 139 12/23/2019 1447   K 4.6 12/23/2019 1447   CL 99 12/23/2019 1447   CO2 24 12/23/2019 1447   GLUCOSE 89 12/23/2019 1447   GLUCOSE 114 (H) 10/26/2018 2318   BUN 13 12/23/2019 1447   CREATININE 0.86 12/23/2019 1447   CREATININE 0.79 12/09/2016 0934   CALCIUM 9.4 12/23/2019 1447   GFRNONAA 100 12/23/2019 1447   GFRNONAA >89 12/09/2016 0934   GFRAA 116 12/23/2019 1447   GFRAA >89 12/09/2016 0934    Lipid Panel     Component Value Date/Time   CHOL 139 12/23/2019 1447   TRIG 231 (H) 12/23/2019 1447   HDL 23 (L) 12/23/2019 1447   CHOLHDL 6.4 (H) 12/09/2016 0934   VLDL 29 12/09/2016 0934   LDLCALC 77 12/23/2019 1447    CBC    Component Value Date/Time   WBC 5.4  11/27/2018 1344   WBC 6.8 10/26/2018 2318   RBC 5.05 11/27/2018 1344   RBC 4.53 10/26/2018 2318   HGB 14.1 11/27/2018 1344   HCT 41.8 11/27/2018 1344   PLT 200 11/27/2018 1344   MCV 83 11/27/2018 1344   MCH 27.9 11/27/2018 1344   MCH 27.4 10/26/2018 2318   MCHC 33.7 11/27/2018 1344   MCHC 31.6 10/26/2018 2318   RDW 13.1 11/27/2018 1344   LYMPHSABS 1.9 11/27/2018 1344   MONOABS 1.4 (H) 10/26/2018 2318   EOSABS 0.5 (H) 11/27/2018 1344   BASOSABS 0.2 11/27/2018 1344    Hgb A1C Lab Results  Component Value Date   HGBA1C 5.1 12/09/2016           Assessment & Plan:     Nicki Reaper, NP This visit occurred during the SARS-CoV-2 public health emergency.  Safety protocols were in place, including screening questions prior to the visit, additional usage of staff PPE, and extensive cleaning of exam room while observing appropriate contact time as indicated for disinfecting solutions.

## 2021-01-17 ENCOUNTER — Encounter: Payer: Self-pay | Admitting: Internal Medicine

## 2021-01-18 MED ORDER — NORTRIPTYLINE HCL 25 MG PO CAPS: ORAL_CAPSULE | ORAL | 1 refills | Status: DC

## 2021-02-24 ENCOUNTER — Ambulatory Visit: Payer: Self-pay

## 2021-02-24 ENCOUNTER — Telehealth (INDEPENDENT_AMBULATORY_CARE_PROVIDER_SITE_OTHER): Payer: Medicare Other | Admitting: Internal Medicine

## 2021-02-24 ENCOUNTER — Other Ambulatory Visit: Payer: Self-pay

## 2021-02-24 ENCOUNTER — Encounter: Payer: Self-pay | Admitting: Internal Medicine

## 2021-02-24 DIAGNOSIS — R52 Pain, unspecified: Secondary | ICD-10-CM | POA: Diagnosis not present

## 2021-02-24 DIAGNOSIS — J029 Acute pharyngitis, unspecified: Secondary | ICD-10-CM

## 2021-02-24 DIAGNOSIS — R0602 Shortness of breath: Secondary | ICD-10-CM

## 2021-02-24 DIAGNOSIS — R0981 Nasal congestion: Secondary | ICD-10-CM

## 2021-02-24 DIAGNOSIS — Z20822 Contact with and (suspected) exposure to covid-19: Secondary | ICD-10-CM

## 2021-02-24 DIAGNOSIS — R059 Cough, unspecified: Secondary | ICD-10-CM | POA: Diagnosis not present

## 2021-02-24 DIAGNOSIS — R509 Fever, unspecified: Secondary | ICD-10-CM | POA: Diagnosis not present

## 2021-02-24 MED ORDER — PREDNISONE 10 MG PO TABS: ORAL_TABLET | ORAL | 0 refills | Status: DC

## 2021-02-24 MED ORDER — AZITHROMYCIN 250 MG PO TABS: ORAL_TABLET | ORAL | 0 refills | Status: DC

## 2021-02-24 NOTE — Telephone Encounter (Signed)
Pt. Reports exposed to COVID from son. Has not tested yet. Has cough with brown mucus, temp. 100.6, runny nose. Symptoms started Tuesday. Virtual appointment made per [facility] agent.    Answer Assessment - Initial Assessment Questions 1. COVID-19 DIAGNOSIS: "Who made your COVID-19 diagnosis?" "Was it confirmed by a positive lab test or self-test?" If not diagnosed by a doctor (or NP/PA), ask "Are there lots of cases (community spread) where you live?" Note: See public health department website, if unsure.     No test 2. COVID-19 EXPOSURE: "Was there any known exposure to COVID before the symptoms began?" CDC Definition of close contact: within 6 feet (2 meters) for a total of 15 minutes or more over a 24-hour period.      Yes 3. ONSET: "When did the COVID-19 symptoms start?"      Tuesday 4. WORST SYMPTOM: "What is your worst symptom?" (e.g., cough, fever, shortness of breath, muscle aches)     Cough 5. COUGH: "Do you have a cough?" If Yes, ask: "How bad is the cough?"       Yes 6. FEVER: "Do you have a fever?" If Yes, ask: "What is your temperature, how was it measured, and when did it start?"     100.6 7. RESPIRATORY STATUS: "Describe your breathing?" (e.g., shortness of breath, wheezing, unable to speak)      Mild shortness of breath 8. BETTER-SAME-WORSE: "Are you getting better, staying the same or getting worse compared to yesterday?"  If getting worse, ask, "In what way?"     Same 9. HIGH RISK DISEASE: "Do you have any chronic medical problems?" (e.g., asthma, heart or lung disease, weak immune system, obesity, etc.)     No 10. VACCINE: "Have you had the COVID-19 vaccine?" If Yes, ask: "Which one, how many shots, when did you get it?"       No 11. BOOSTER: "Have you received your COVID-19 booster?" If Yes, ask: "Which one and when did you get it?"       No 12. PREGNANCY: "Is there any chance you are pregnant?" "When was your last menstrual period?"       N/a 13. OTHER SYMPTOMS: "Do you  have any other symptoms?"  (e.g., chills, fatigue, headache, loss of smell or taste, muscle pain, sore throat)       Runny nose 14. O2 SATURATION MONITOR:  "Do you use an oxygen saturation monitor (pulse oximeter) at home?" If Yes, ask "What is your reading (oxygen level) today?" "What is your usual oxygen saturation reading?" (e.g., 95%)       No  Protocols used: Coronavirus (COVID-19) Diagnosed or Suspected-A-AH

## 2021-02-24 NOTE — Patient Instructions (Signed)
COVID-19: Quarantine and Isolation Quarantine If you were exposed Quarantine and stay away from others when you have been in close contact with someone whohas COVID-19. Isolate If you are sick or test positive Isolate when you are sick or when you have COVID-19, even if you don't have symptoms. When to stay home Calculating quarantine The date of your exposure is considered day 0. Day 1 is the first full day after your last contact with a person who has had COVID-19. Stay home and away from other people for at least 5 days. Learn why CDC updated guidance for the general public. IF YOU were exposed to COVID-19 and are NOT up-to-date IF YOU were exposed to COVID-19 and are NOT on COVID-19 vaccinations Quarantine for at least 5 days Stay home Stay home and quarantine for at least 5 full days. Wear a well-fitted mask if you must be around others in your home. Do not travel. Get tested Even if you don't develop symptoms, get tested at least 5 days after you last had close contact with someone with COVID-19. After quarantine Watch for symptoms Watch for symptoms until 10 days after you last had close contact with someone with COVID-19. Avoid travel It is best to avoid travel until a full 10 days after you last had close contact with someone with COVID-19. If you develop symptoms Isolate immediately and get tested. Continue to stay home until you know the results. Wear a well-fitted mask around others. Take precautions until day 10 Wear a mask Wear a well-fitted mask for 10 full days any time you are around others inside your home or in public. Do not go to places where you are unable to wear a mask. If you must travel during days 6-10, take precautions. Avoid being around people who are at high risk IF YOU were exposed to COVID-19 and are up-to-date IF YOU were exposed to COVID-19 and are on COVID-19 vaccinations No quarantine You do not need to stay home unless you develop  symptoms. Get tested Even if you don't develop symptoms, get tested at least 5 days after you last had close contact with someone with COVID-19. Watch for symptoms Watch for symptoms until 10 days after you last had close contact with someone with COVID-19. If you develop symptoms Isolate immediately and get tested. Continue to stay home until you know the results. Wear a well-fitted mask around others. Take precautions until day 10 Wear a mask Wear a well-fitted mask for 10 full days any time you are around others inside your home or in public. Do not go to places where you are unable to wear a mask. Take precautions if traveling Avoid being around people who are at high risk IF YOU were exposed to COVID-19 and had confirmed COVID-19 within the past 90 days (you tested positive using a viral test) No quarantine You do not need to stay home unless you develop symptoms. Watch for symptoms Watch for symptoms until 10 days after you last had close contact with someone with COVID-19. If you develop symptoms Isolate immediately and get tested. Continue to stay home until you know the results. Wear a well-fitted mask around others. Take precautions until day 10 Wear a mask Wear a well-fitted mask for 10 full days any time you are around others inside your home or in public. Do not go to places where you are unable to wear a mask. Take precautions if traveling Avoid being around people who are at high risk Calculating isolation   Day 0 is your first day of symptoms or a positive viral test. Day 1 is the first full day after your symptoms developed or your test specimen was collected. If you have COVID-19 or have symptoms, isolate for at least 5 days. IF YOU tested positive for COVID-19 or have symptoms, regardless of vaccination status Stay home for at least 5 days Stay home for 5 days and isolate from others in your home. Wear a well-fitted mask if you must be around others in your home. Do not  travel. Ending isolation if you had symptoms End isolation after 5 full days if you are fever-free for 24 hours (without the use of fever-reducing medication) and your symptoms are improving. Ending isolation if you did NOT have symptoms End isolation after at least 5 full days after your positive test. If you were severely ill with COVID-19 or are immunocompromised You should isolate for at least 10 days. Consult your doctor before ending isolation. Take precautions until day 10 Wear a mask Wear a well-fitted mask for 10 full days any time you are around others inside your home or in public. Do not go to places where you are unable to wear a mask. Do not travel Do not travel until a full 10 days after your symptoms started or the date your positive test was taken if you had no symptoms. Avoid being around people who are at high risk Definitions Exposure Contact with someone infected with SARS-CoV-2, the virus that causes COVID-19,in a way that increases the likelihood of getting infected with the virus. Close contact A close contact is someone who was less than 6 feet away from an infected person (laboratory-confirmed or a clinical diagnosis) for a cumulative total of 15 minutes or more over a 24-hour period. For example, three individual 5-minute exposures for a total of 15 minutes. People who are exposed to someone with COVID-19 after they completed at least 5 days of isolation are notconsidered close contacts. Quarantine Quarantine is a strategy used to prevent transmission of COVID-19 by keeping people who have been in close contact with someone with COVID-19 apart from others. Who does not need to quarantine? If you had close contact with someone with COVID-19 and you are in one of the following groups, you do not need to quarantine. You are up to date with your COVID-19 vaccines. You had confirmed COVID-19 within the last 90 days (meaning you tested positive using a viral test). You  should wear a well-fitting mask around others for 10 days from the date of your last close contact with someone with COVID-19 (the date of last close contact is considered day 0). Get tested at least 5 days after you last had close contact with someone with COVID-19. If you test positive or develop COVID-19 symptoms, isolate from other people and follow recommendations in the Isolation section below. If you tested positive for COVID-19 with a viral test within the previous 90 days and subsequently recovered and remain without COVID-19 symptoms, you do not need to quarantine or get tested after close contact. You should wear a well-fitting mask around others for 10 days from the date of your last close contact withsomeone with COVID-19 (the date of last close contact is considered day 0). Who should quarantine? If you come into close contact with someone with COVID-19, you should quarantine if you are not up to date on COVID-19 vaccines. This includes people who are not vaccinated. What to do for quarantine Stay home and away   from other people for at least 5 days (day 0 through day 5) after your last contact with a person who has COVID-19. The date of your exposure is considered day 0. Wear a well-fitting mask when around others at home, if possible. For 10 days after your last close contact with someone with COVID-19, watch for fever (100.4F or greater), cough, shortness of breath, or other COVID-19 symptoms. If you develop symptoms, get tested immediately and isolate until you receive your test results. If you test positive, follow isolation recommendations. If you do not develop symptoms, get tested at least 5 days after you last had close contact with someone with COVID-19. If you test negative, you can leave your home, but continue to wear a well-fitting mask when around others at home and in public until 10 days after your last close contact with someone with COVID-19. If you test positive, you should  isolate for at least 5 days from the date of your positive test (if you do not have symptoms). If you do develop COVID-19 symptoms, isolate for at least 5 days from the date your symptoms began (the date the symptoms started is day 0). Follow recommendations in the isolation section below. If you are unable to get a test 5 days after last close contact with someone with COVID-19, you can leave your home after day 5 if you have been without COVID-19 symptoms throughout the 5-day period. Wear a well-fitting mask for 10 days after your date of last close contact when around others at home and in public. Avoid people who are immunocompromised or at high risk for severe disease, and nursing homes and other high-risk settings, until after at least 10 days. If possible, stay away from people you live with, especially people who are at higher risk for getting very sick from COVID-19, as well as others outside your home throughout the full 10 days after your last close contact with someone with COVID-19. If you are unable to quarantine, you should wear a well-fitting mask for 10 days when around others at home and in public. If you are unable to wear a mask when around others, you should continue to quarantine for 10 days. Avoid people who are immunocompromised or at high risk for severe disease, and nursing homes and other high-risk settings, until after at least 10 days. See additional information about travel. Do not go to places where you are unable to wear a mask, such as restaurants and some gyms, and avoid eating around others at home and at work until after 10 days after your last close contact with someone with COVID-19. After quarantine Watch for symptoms until 10 days after your last close contact with someone with COVID-19. If you have symptoms, isolate immediately and get tested. Quarantine in high-risk congregate settings In certain congregate settings that have high risk of secondary transmission  (such as correctional and detention facilities, homeless shelters, or cruise ships), CDC recommends a 10-day quarantine for residents, regardless of vaccination and booster status. During periods of critical staffing shortages, facilities may consider shortening the quarantine period for staff to ensure continuity of operations. Decisions to shorten quarantine in these settings should be made in consultation with state, local, tribal, or territorial health departments and should take into consideration the context and characteristics of the facility. CDC's setting-specific guidance provides additional recommendations for these settings. Isolation Isolation is used to separate people with confirmed or suspected COVID-19 from those without COVID-19. People who are in isolation should stay   home until it's safe for them to be around others. At home, anyone sick or infected should separate from others, or wear a well-fitting mask when they need to be around others. People in isolation should stay in a specific "sick room" or area and use a separate bathroom if available. Everyone who has presumed or confirmed COVID-19 should stay home and isolate from other people for at least 5 full days (day 0 is the first day of symptoms or the date of the day of the positive viral test for asymptomatic persons). They should wear a mask when around others at home and in public for an additional 5 days. People who are confirmed to have COVID-19 or are showing symptoms of COVID-19 need to isolate regardless of their vaccination status. This includes: People who have a positive viral test for COVID-19, regardless of whether or not they have symptoms. People with symptoms of COVID-19, including people who are awaiting test results or have not been tested. People with symptoms should isolate even if they do not know if they have been in close contact with someone with COVID-19. What to do for isolation Monitor your symptoms. If you  have an emergency warning sign (including trouble breathing), seek emergency medical care immediately. Stay in a separate room from other household members, if possible. Use a separate bathroom, if possible. Take steps to improve ventilation at home, if possible. Avoid contact with other members of the household and pets. Don't share personal household items, like cups, towels, and utensils. Wear a well-fitting mask when you need to be around other people. Learn more about what to do if you are sick and how to notify your contacts. Ending isolation for people who had COVID-19 and had symptoms If you had COVID-19 and had symptoms, isolate for at least 5 days. To calculate your 5-day isolation period, day 0 is your first day of symptoms. Day 1 is the first full day after your symptoms developed. You can leave isolation after 5 full days. You can end isolation after 5 full days if you are fever-free for 24 hours without the use of fever-reducing medication and your other symptoms have improved (Loss of taste and smell may persist for weeks or months after recovery and need not delay the end of isolation). You should continue to wear a well-fitting mask around others at home and in public for 5 additional days (day 6 through day 10) after the end of your 5-day isolation period. If you are unable to wear a mask when around others, you should continue to isolate for a full 10 days. Avoid people who are immunocompromised or at high risk for severe disease, and nursing homes and other high-risk settings, until after at least 10 days. If you continue to have fever or your other symptoms have not improved after 5 days of isolation, you should wait to end your isolation until you are fever-free for 24 hours without the use of fever-reducing medication and your other symptoms have improved. Continue to wear a well-fitting mask. Contact your healthcare provider if you have questions. See additional information about  travel. Do not go to places where you are unable to wear a mask, such as restaurants and some gyms, and avoid eating around others at home and at work until a full 10 days after your first day of symptoms. If an individual has access to a test and wants to test, the best approach is to use an antigen test1 towards the end of   the 5-day isolation period. Collect the test sample only if you are fever-free for 24 hours without the use of fever-reducing medication and your other symptoms have improved (loss of taste and smell may persist for weeks or months after recovery and need not delay the end of isolation). If your test result is positive, you should continue to isolate until day 10. If your test result is negative, you can end isolation, but continue to wear a well-fitting mask around others at home and in public until day 10. Follow additional recommendations for masking and avoiding travel as described above. 1As noted in the labeling for authorized over-the counter antigen tests: Negative results should be treated as presumptive. Negative results do not rule out SARS-CoV-2 infection and should not be used as the sole basis for treatment or patient management decisions, including infection control decisions. To improve results, antigen tests should be used twice over a three-day period with at least 24 hours and no more than 48 hours between tests. Note that these recommendations on ending isolation do not apply to people with moderate or severe COVID-19 or with weakened immune systems (immunocompromised). See section below for recommendations for when toend isolation for these groups. Ending isolation for people who tested positive for COVID-19 but had no symptoms If you test positive for COVID-19 and never develop symptoms, isolate for at least 5 days. Day 0 is the day of your positive viral test (based on the date you were tested) and day 1 is the first full day after the specimen was collected for your  positive test. You can leave isolation after 5 full days. If you continue to have no symptoms, you can end isolation after at least 5 days. You should continue to wear a well-fitting mask around others at home and in public until day 10 (day 6 through day 10). If you are unable to wear a mask when around others, you should continue to isolate for 10 days. Avoid people who are immunocompromised or at high risk for severe disease, and nursing homes and other high-risk settings, until after at least 10 days. If you develop symptoms after testing positive, your 5-day isolation period should start over. Day 0 is your first day of symptoms. Follow the recommendations above for ending isolation for people who had COVID-19 and had symptoms. See additional information about travel. Do not go to places where you are unable to wear a mask, such as restaurants and some gyms, and avoid eating around others at home and at work until 10 days after the day of your positive test. If an individual has access to a test and wants to test, the best approach is to use an antigen test1 towards the end of the 5-day isolation period. If your test result is positive, you should continue to isolate until day 10. If your test result is negative, you can end isolation, but continue to wear a well-fitting mask around others at home and in public until day 10. Follow additionalrecommendations for masking and avoiding travel as described above. 1As noted in the labeling for authorized over-the counter antigen tests: Negative results should be treated as presumptive. Negative results do not rule out SARS-CoV-2 infection and should not be used as the sole basis for treatment or patient management decisions, including infection control decisions. To improve results, antigen tests should be used twice over a three-day period with at least 24 hours and no more than 48 hours between tests. Ending isolation for people who were   severely ill with  COVID-19 or have a weakened immune system (immunocompromised) People who are severely ill with COVID-19 (including those who were hospitalized or required intensive care or ventilation support) and people with compromised immune systems might need to isolate at home longer. They may also require testing with a viral test to determine when they can be around others. CDC recommends an isolation period of at least 10 and up to 20 days for people who were severely ill with COVID-19 and for people with weakened immune systems. Consult with your healthcare provider about when you can resume being aroundother people. People who are immunocompromised should talk to their healthcare provider about the potential for reduced immune responses to COVID-19 vaccines and the need to continue to follow current prevention measures (including wearing a well-fitting mask, staying 6 feet apart from others they don't live with, and avoiding crowds and poorly ventilated indoor spaces) to protect themselves against COVID-19 until advised otherwise by their healthcare provider. Close contacts of immunocompromised people--including household members--should also be encouraged to receive all recommended COVID-19 vaccine doses to help protect these people. Isolation in high-risk congregate settings In certain high-risk congregate settings that have high risk of secondary transmission and where it is not feasible to cohort people (such as correctional and detention facilities, homeless shelters, and cruise ships), CDC recommends a 10-day isolation period for residents. During periods of critical staffing shortages, facilities may consider shortening the isolation period for staff to ensure continuity of operations. Decisions to shorten isolation in these settings should be made in consultation with state, local, tribal, or territorial health departments and should take into consideration the context and characteristics of the facility.  CDC's setting-specific guidance provides additional recommendations for these settings. This CDC guidance is meant to supplement--not replace--any federal, state, local,territorial, or tribal health and safety laws, rules, and regulations. Recommendations for specific settings These recommendations do not apply to healthcare professionals. For guidance specific to these settings, see Healthcare professionals: Interim Guidance for Managing Healthcare Personnel with SARS-CoV-2 Infection or Exposure to SARS-CoV-2 Patients, residents, and visitors to healthcare settings: Interim Infection Prevention and Control Recommendations for Healthcare Personnel During the Coronavirus Disease 2019 (COVID-19) Pandemic Additional setting-specific guidance and recommendations are available. These recommendations on quarantine and isolation do apply to K-12 School settings. Additional guidance is available here: Overview of COVID-19 Quarantine for K-12 Schools Travelers: Travel information and recommendations Congregate facilities and other settings: guidance pages for community, work, and school settings Ongoing COVID-19 exposure FAQs I live with someone with COVID-19, but I cannot be separated from them. How do we manage quarantine in this situation? It is very important for people with COVID-19 to remain apart from other people, if possible, even if they are living together. If separation of the person with COVID-19 from others that they live with is not possible, the other people that they live with will have ongoing exposure, meaning they will be repeatedly exposed until that person is no longer able to spread the virus to other people. In this situation, there are precautions you can take to limit the spread of COVID-19: The person with COVID-19 and everyone they live with should wear a well-fitting mask inside the home. If possible, one person should care for the person with COVID-19 to limit the number of people  who are in close contact with the infected person. Take steps to protect yourself and others to reduce transmission in the home: Quarantine if you are not up to date with your COVID-19   vaccines. Isolate if you are sick or tested positive for COVID-19, even if you don't have symptoms. Learn more about the public health recommendations for testing, mask use and quarantine of close contacts, like yourself, who have ongoing exposure. These recommendations differ depending on your vaccination status. What should I do if I have ongoing exposure to COVID-19 from someone I live with? Recommendations for this situation depend on your vaccination status: If you are not up to date on COVID-19 vaccines and have ongoing exposure to COVID-19, you should: Begin quarantine immediately and continue to quarantine throughout the isolation period of the person with COVID-19. Continue to quarantine for an additional 5 days starting the day after the end of isolation for the person with COVID-19. Get tested at least 5 days after the end of isolation of the infected person that lives with them. If you test negative, you can leave the home but should continue to wear a well-fitting mask when around others at home and in public until 10 days after the end of isolation for the person with COVID-19. Isolate immediately if you develop symptoms of COVID-19 or test positive. If you are up to date with COVID-19 vaccines and have ongoing exposure to COVID-19, you should: Get tested at least 5 days after your first exposure. A person with COVID-19 is considered infectious starting 2 days before they develop symptoms, or 2 days before the date of their positive test if they do not have symptoms. Get tested again at least 5 days after the end of isolation for the person with COVID-19. Wear a well-fitting mask when you are around the person with COVID-19, and do this throughout their isolation period. Wear a well-fitting mask around  others for 10 days after the infected person's isolation period ends. Isolate immediately if you develop symptoms of COVID-19 or test positive. What should I do if multiple people I live with test positive for COVID-19 at different times? Recommendations for this situation depend on your vaccination status: If you are not up to date with your COVID-19 vaccines, you should: Quarantine throughout the isolation period of any infected person that you live with. Continue to quarantine until 5 days after the end of isolation date for the most recently infected person that lives with you. For example, if the last day of isolation of the person most recently infected with COVID-19 was June 30, the new 5-day quarantine period starts on July 1. Get tested at least 5 days after the end of isolation for the most recently infected person that lives with you. Wear a well-fitting mask when you are around any person with COVID-19 while that person is in isolation. Wear a well-fitting mask when you are around other people until 10 days after your last close contact. Isolate immediately if you develop symptoms of COVID-19 or test positive. If you are up to date with COVID-19 your vaccines , you should: Get tested at least 5 days after your first exposure. A person with COVID-19 is considered infectious starting 2 days before they developed symptoms, or 2 days before the date of their positive test if they do not have symptoms. Get tested again at least 5 days after the end of isolation for the most recently infected person that lives with you. Wear a well-fitting mask when you are around any person with COVID-19 while that person is in isolation. Wear a well-fitting mask around others for 10 days after the end of isolation for the most recently infected person that   lives with you. For example, if the last day of isolation for the person most recently infected with COVID-19 was June 30, the new 10-day period to wear a  well-fitting mask indoors in public starts on July 1. Isolate immediately if you develop symptoms of COVID-19 or test positive. I had COVID-19 and completed isolation. Do I have to quarantine or get tested if someone I live with gets COVID-19 shortly after I completed isolation? No. If you recently completed isolation and someone that lives with you tests positive for the virus that causes COVID-19 shortly after the end of your isolation period, you do not have to quarantine or get tested as long as you do not develop new symptoms. Once all of the people that live together have completed isolation or quarantine, refer to the guidance below for new exposures to COVID-19. If you had COVID-19 in the previous 90 days and then came into close contact with someone with COVID-19, you do not have to quarantine or get tested if you do not have symptoms. But you should: Wear a well-fitting mask indoors in public for 10 days after exposure. Monitor for COVID-19 symptoms and isolate immediately if symptoms develop. Consult with a healthcare provider for testing recommendations if new symptoms develop. If more than 90 days have passed since your recovery from infection, follow CDC's recommendations for close contacts. These recommendations will differ depending on your vaccination status. 08/27/2020 Content source: National Center for Immunization and Respiratory Diseases (NCIRD), Division of Viral Diseases This information is not intended to replace advice given to you by your health care provider. Make sure you discuss any questions you have with your healthcare provider. Document Revised: 09/04/2020 Document Reviewed: 09/04/2020 Elsevier Patient Education  2022 Elsevier Inc.  

## 2021-02-24 NOTE — Progress Notes (Signed)
Virtual Visit via Video Note  I connected with Brandon Myers on 02/24/21 at 11:20 AM EDT by a video enabled telemedicine application and verified that I am speaking with the correct person using two identifiers.  Location: Patient: Home Provider: Office  Person's participating in this video call: Nicki Reaper, NP and Murlean Caller.   I discussed the limitations of evaluation and management by telemedicine and the availability of in person appointments. The patient expressed understanding and agreed to proceed.  History of Present Illness:  Pt reports nasal congestion, sore throat, cough, shortness of breath, fever and body aches. He reports this started 1 week ago. He is blowing yellow mucous out of his nose. He denies difficulty swallowing.  The cough is productive of dark brown mucus.  He denies headache, ear pain, chest pain, nausea, vomiting or diarrhea.  He reports fever, chills and body aches.  He has had exposure to COVID but has not taking a home COVID test.  He has not had a COVID-vaccine.  He has taken Tylenol and DayQuil OTC with minimal relief of symptoms.   Past Medical History:  Diagnosis Date   Allergy    Anxiety 01/21/2020   Controlled substance agreement signed 04/02/2017   GERD (gastroesophageal reflux disease)    Hypertension    Neutropenia, congenital (HCC)    Phantom limb pain (HCC)     Current Outpatient Medications  Medication Sig Dispense Refill   acetaminophen (TYLENOL) 500 MG tablet Take 1,000 mg by mouth every 6 (six) hours as needed.     azithromycin (ZITHROMAX) 250 MG tablet Take 2 tabs today, then 1 tab daily x 4 days 6 tablet 0   filgrastim-sndz (ZARXIO) 480 MCG/0.8ML SOSY injection Inject 0.8 mLs into the muscle every other day.     gabapentin (NEURONTIN) 300 MG capsule Take 1 capsule (300 mg total) by mouth daily. 90 capsule 0   gabapentin (NEURONTIN) 800 MG tablet Take 1 tablet (800 mg total) by mouth 3 (three) times daily. 270 tablet 0   loratadine  (CLARITIN) 10 MG tablet TAKE 1 TABLET(10 MG) BY MOUTH DAILY 90 tablet 3   losartan (COZAAR) 100 MG tablet TAKE 1 TABLET BY MOUTH EVERY DAY 90 tablet 1   meloxicam (MOBIC) 7.5 MG tablet Take 1 tablet (7.5 mg total) by mouth daily. 90 tablet 1   metoprolol succinate (TOPROL-XL) 100 MG 24 hr tablet TAKE 1 TABLET BY MOUTH EVERY DAY WITH OR IMMEDIATELY FOLLOWING A MEAL 90 tablet 0   nortriptyline (PAMELOR) 25 MG capsule TAKE 1 CAPSULE(25 MG) BY MOUTH AT BEDTIME 90 capsule 1   omeprazole (PRILOSEC) 40 MG capsule TAKE 1 CAPSULE(40 MG) BY MOUTH DAILY 90 capsule 0   predniSONE (DELTASONE) 10 MG tablet Take 6 tabs on day 1, 5 tabs on day 2, 4 tabs on day 3, 3 tabs on day 4, 2 tabs on day 5, 1 tab on day 6 21 tablet 0   VENTOLIN HFA 108 (90 Base) MCG/ACT inhaler INHALE 2 PUFFS BY MOUTH FOUR TIMES DAILY AS NEEDED 18 g 12   HYDROcodone-acetaminophen (NORCO) 10-325 MG tablet SMARTSIG:1-2 Tablet(s) By Mouth Every 12 Hours PRN     No current facility-administered medications for this visit.    Allergies  Allergen Reactions   Enalapril Cough    ACEi Cough   Hydrochlorothiazide     Sweats, hot flashes   Prochlorperazine     Other reaction(s): Other (See Comments) As a child unable to recall   Sulfa Antibiotics Other (See  Comments)    Other reaction(s): Other (See Comments) As a child unable to recall   Benadryl [Diphenhydramine]     Increased phantom pain   Strawberry Extract Rash    Family History  Problem Relation Age of Onset   Heart Problems Mother    Asthma Mother    Depression Mother    Thyroid disease Mother    Alzheimer's disease Mother    Hypertension Mother    Stroke Maternal Grandmother    Cancer Maternal Grandmother    Cancer Father        skin   Hypertension Father    Cancer Paternal Grandmother    Cancer Paternal Grandfather     Social History   Socioeconomic History   Marital status: Married    Spouse name: Not on file   Number of children: Not on file   Years of  education: Not on file   Highest education level: Associate degree: academic program  Occupational History   Not on file  Tobacco Use   Smoking status: Never   Smokeless tobacco: Never  Vaping Use   Vaping Use: Never used  Substance and Sexual Activity   Alcohol use: Never    Alcohol/week: 0.0 standard drinks   Drug use: Never   Sexual activity: Never  Other Topics Concern   Not on file  Social History Narrative   Not on file   Social Determinants of Health   Financial Resource Strain: Not on file  Food Insecurity: Not on file  Transportation Needs: Not on file  Physical Activity: Not on file  Stress: Not on file  Social Connections: Not on file  Intimate Partner Violence: Not on file     Constitutional: Patient reports fever, chills and body aches.  Denies headache or abrupt weight changes.  HEENT: Patient reports nasal congestion and sore throat.  Denies eye pain, eye redness, ear pain, ringing in the ears, wax buildup, runny nose, bloody nose. Respiratory: Patient reports cough and shortness of breath.  Denies difficulty breathing.   Cardiovascular: Denies chest pain, chest tightness, palpitations or swelling in the hands or feet.  Gastrointestinal: Denies abdominal pain, bloating, constipation, diarrhea or blood in the stool.   No other specific complaints in a complete review of systems (except as listed in HPI above).  Observations/Objective:  Wt Readings from Last 3 Encounters:  01/11/21 207 lb (93.9 kg)  12/08/20 195 lb (88.5 kg)  09/10/20 195 lb (88.5 kg)    General: Appears his stated age, ill-appearing but in NAD. HEENT: Head: normal shape and size;  Nose: Congestion noted; Throat/Mouth: Hoarseness noted Pulmonary/Chest: Normal effort. No respiratory distress.  Neurological: Alert and oriented.   BMET    Component Value Date/Time   NA 139 12/23/2019 1447   K 4.6 12/23/2019 1447   CL 99 12/23/2019 1447   CO2 24 12/23/2019 1447   GLUCOSE 89  12/23/2019 1447   GLUCOSE 114 (H) 10/26/2018 2318   BUN 13 12/23/2019 1447   CREATININE 0.86 12/23/2019 1447   CREATININE 0.79 12/09/2016 0934   CALCIUM 9.4 12/23/2019 1447   GFRNONAA 100 12/23/2019 1447   GFRNONAA >89 12/09/2016 0934   GFRAA 116 12/23/2019 1447   GFRAA >89 12/09/2016 0934    Lipid Panel     Component Value Date/Time   CHOL 139 12/23/2019 1447   TRIG 231 (H) 12/23/2019 1447   HDL 23 (L) 12/23/2019 1447   CHOLHDL 6.4 (H) 12/09/2016 0934   VLDL 29 12/09/2016 0934   LDLCALC  77 12/23/2019 1447    CBC    Component Value Date/Time   WBC 5.4 11/27/2018 1344   WBC 6.8 10/26/2018 2318   RBC 5.05 11/27/2018 1344   RBC 4.53 10/26/2018 2318   HGB 14.1 11/27/2018 1344   HCT 41.8 11/27/2018 1344   PLT 200 11/27/2018 1344   MCV 83 11/27/2018 1344   MCH 27.9 11/27/2018 1344   MCH 27.4 10/26/2018 2318   MCHC 33.7 11/27/2018 1344   MCHC 31.6 10/26/2018 2318   RDW 13.1 11/27/2018 1344   LYMPHSABS 1.9 11/27/2018 1344   MONOABS 1.4 (H) 10/26/2018 2318   EOSABS 0.5 (H) 11/27/2018 1344   BASOSABS 0.2 11/27/2018 1344    Hgb A1C Lab Results  Component Value Date   HGBA1C 5.1 12/09/2016        Assessment and Plan:  Nasal Congestion, Sore Throat, Cough, Shortness of Breath, Fever, Chills and Body Aches, Exposure to COVID-19:  He has not taken a COVID test and at this point there is no point because he is outside the window for antiviral therapy Encouraged rest and fluids Okay to continue Tylenol and DayQuil as needed for fever and body aches Will need to quarantine until 24 hours without fever without medication Rx for Pred taper x6 days Rx for Azithromycin x5 days  Return/ER precautions discussed Follow Up Instructions:    I discussed the assessment and treatment plan with the patient. The patient was provided an opportunity to ask questions and all were answered. The patient agreed with the plan and demonstrated an understanding of the instructions.    The patient was advised to call back or seek an in-person evaluation if the symptoms worsen or if the condition fails to improve as anticipated.   Nicki Reaper, NP

## 2021-03-25 ENCOUNTER — Other Ambulatory Visit: Payer: Self-pay

## 2021-03-25 ENCOUNTER — Encounter: Payer: Self-pay | Admitting: Student in an Organized Health Care Education/Training Program

## 2021-03-25 ENCOUNTER — Ambulatory Visit
Payer: Medicare Other | Attending: Student in an Organized Health Care Education/Training Program | Admitting: Student in an Organized Health Care Education/Training Program

## 2021-03-25 VITALS — BP 152/96 | HR 86 | Temp 97.2°F | Resp 16 | Ht 68.0 in | Wt 201.0 lb

## 2021-03-25 DIAGNOSIS — G894 Chronic pain syndrome: Secondary | ICD-10-CM | POA: Insufficient documentation

## 2021-03-25 DIAGNOSIS — M79604 Pain in right leg: Secondary | ICD-10-CM | POA: Diagnosis not present

## 2021-03-25 DIAGNOSIS — S48911S Complete traumatic amputation of right shoulder and upper arm, level unspecified, sequela: Secondary | ICD-10-CM | POA: Insufficient documentation

## 2021-03-25 DIAGNOSIS — G546 Phantom limb syndrome with pain: Secondary | ICD-10-CM | POA: Diagnosis not present

## 2021-03-25 DIAGNOSIS — G8929 Other chronic pain: Secondary | ICD-10-CM | POA: Insufficient documentation

## 2021-03-25 DIAGNOSIS — D700 Congenital agranulocytosis: Secondary | ICD-10-CM | POA: Insufficient documentation

## 2021-03-25 DIAGNOSIS — S88919A Complete traumatic amputation of unspecified lower leg, level unspecified, initial encounter: Secondary | ICD-10-CM | POA: Diagnosis not present

## 2021-03-25 MED ORDER — HYDROCODONE-ACETAMINOPHEN 5-325 MG PO TABS: 1.0000 | ORAL_TABLET | Freq: Every day | ORAL | 0 refills | Status: AC | PRN

## 2021-03-25 MED ORDER — ALPHA-LIPOIC ACID 600 MG PO CAPS: 600.0000 mg | ORAL_CAPSULE | Freq: Every day | ORAL | 0 refills | Status: AC

## 2021-03-25 NOTE — Progress Notes (Signed)
PROVIDER NOTE: Information contained herein reflects review and annotations entered in association with encounter. Interpretation of such information and data should be left to medically-trained personnel. Information provided to patient can be located elsewhere in the medical record under "Patient Instructions". Document created using STT-dictation technology, any transcriptional errors that may result from process are unintentional.    Patient: Brandon Myers  Service Category: E/M  Provider: [name], MD  DOB: [birth year]  DOS: 03/25/2021  Specialty: Interventional Pain Management  MRN: [id number]  Setting: Ambulatory outpatient  PCP: Brandon Fenton, NP  Type: Established Patient    Referring Provider: Verl Bangs, FNP  Location: Office  Delivery: Face-to-face     HPI  Brandon Myers, a 55 y.o. year old male, is here today because of his Chronic pain syndrome [G89.4]. Brandon Myers primary complain today is Leg Pain (right) Last encounter: My last encounter with him was on 09/10/2020. 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 Pain Assessment: Severity of Chronic pain, Phantom pain is reported as a 5 /10. Location:   Right/ . Onset: More than a month ago. Quality: Aching, Dull, Shooting. Timing: Constant. Modifying factor(s): medications. Vitals:  height is 5\' 8"  (1.727 m) and weight is 201 lb (91.2 kg). His temporal temperature is 97.2 F (36.2 C) (abnormal). His blood pressure is 152/96 (abnormal) and his pulse is 86. His respiration is 16 and oxygen saturation is 98%.   Reason for encounter: medication management.    No change in medical history since last visit.  Patient's pain is at baseline.  Patient continues multimodal pain regimen as prescribed.  States that it provides pain relief and improvement in functional status.   Expresses interest to wean his opioid medications.  Is looking for any alternatives for his neuropathic pain.  We discussed the addition of alpha lipoic acid and I provided the patient with a wean plan as below. Encouraged him to utilize MiraLAX as needed for constipation   Pharmacotherapy Assessment  Analgesic: Hydrocodone 5mg  every 12 hours as needed, quantity 45--> wean to 30/month    Monitoring:  PMP: PDMP reviewed during this encounter.       Pharmacotherapy: No side-effects or adverse reactions reported. Compliance: No problems identified. Effectiveness: Clinically acceptable.  UDS:  Summary  Date Value Ref Range Status  03/19/2020 Note  Final    Comment:    ==================================================================== [employer] Select 13 (MW) ==================================================================== Test                             Result       Flag       Units  Drug Present and Declared for Prescription Verification   Hydrocodone                    1335         EXPECTED   ng/mg creat   Dihydrocodeine                 306          EXPECTED   ng/mg creat   Norhydrocodone                 1491         EXPECTED   ng/mg creat    Sources of hydrocodone include scheduled  prescription medications.    Dihydrocodeine and norhydrocodone are expected metabolites of    hydrocodone. Dihydrocodeine is also available as a scheduled    prescription medication.  ==================================================================== Test                      Result    Flag   Units      Ref Range   Creatinine              34               mg/dL      >=20 ==================================================================== Declared Medications:  The flagging and interpretation on this report are based on the  following declared medications.  Unexpected results may arise from  inaccuracies in the declared medications.   **Note: The testing scope of this panel includes these  medications:   Hydrocodone (Norco)   **Note: The testing scope of this panel does not include the  following reported medications:   Acetaminophen (Norco)  Albuterol (Ventolin HFA)  Cyclobenzaprine (Flexeril)  Filgrastim (Zarxio)  Gabapentin (Neurontin)  Hydroxyzine (Atarax)  Loratadine (Claritin)  Losartan (Cozaar)  Melatonin  Meloxicam (Mobic)  Metoprolol (Toprol)  Nortriptyline (Pamelor)  Omeprazole (Prilosec) ==================================================================== For clinical consultation, please call [phone]. ====================================================================       ROS  Constitutional: Denies any fever or chills Gastrointestinal: No reported hemesis, hematochezia, vomiting, or acute GI distress Musculoskeletal: Denies any acute onset joint swelling, redness, loss of ROM, or weakness Neurological: No reported episodes of acute onset apraxia, aphasia, dysarthria, agnosia, amnesia, paralysis, loss of coordination, or loss of consciousness  Medication Review  Alpha-Lipoic Acid, HYDROcodone-acetaminophen, acetaminophen, albuterol, azithromycin, filgrastim-sndz, gabapentin, loratadine, losartan, meloxicam, metoprolol succinate, nortriptyline, omeprazole, and predniSONE  History Review  Allergy: Brandon Myers is allergic to enalapril, hydrochlorothiazide, prochlorperazine, sulfa antibiotics, benadryl [diphenhydramine], and strawberry extract. Drug: Brandon Myers  reports no history of drug use. Alcohol:  reports no history of alcohol use. Tobacco:  reports that he has never smoked. He has never used smokeless tobacco. Social: Brandon Myers  reports that he has never smoked. He has never used smokeless tobacco. He reports that he does not drink alcohol and does not use drugs. Medical:  has a past medical history of Allergy, Anxiety (01/21/2020), Controlled substance agreement signed (04/02/2017), GERD (gastroesophageal reflux disease), Hypertension,  Neutropenia, congenital ([place]), and Phantom limb pain ([place]). Surgical: Brandon Myers  has a past surgical history that includes Amputation; Appendectomy; Colon surgery (1994); Above elbow arm amputation (Right, 1993); and Leg amputation through knee (Right, 1993). Family: family history includes Alzheimer's disease in his mother; Asthma in his mother; Cancer in his father, maternal grandmother, paternal grandfather, and paternal grandmother; Depression in his mother; Heart Problems in his mother; Hypertension in his father and mother; Stroke in his maternal grandmother; Thyroid disease in his mother.  Laboratory Chemistry Profile   Renal Lab Results  Component Value Date   BUN 13 12/23/2019   CREATININE 0.86 12/23/2019   BCR 15 12/23/2019   GFRAA 116 12/23/2019   GFRNONAA 100 12/23/2019     Hepatic Lab Results  Component Value Date   AST 67 (H) 12/23/2019   ALT 69 (H) 12/23/2019   ALBUMIN 4.6 12/23/2019   ALKPHOS 141 (H) 12/23/2019     Electrolytes Lab Results  Component Value Date   NA 139 12/23/2019   K 4.6 12/23/2019   CL 99 12/23/2019   CALCIUM 9.4 12/23/2019     Bone No results found for: VD25OH, [id number],  [id number], WP8099IP3, 25OHVITD1, 25OHVITD2, 25OHVITD3, TESTOFREE, TESTOSTERONE   Inflammation (CRP: Acute Phase) (ESR: Chronic Phase) No results found for: CRP, ESRSEDRATE, LATICACIDVEN     Note: Above Lab results reviewed.  Recent Imaging Review  DG Neck Soft Tissue CLINICAL DATA:  Esophageal foreign body. Foreign body sensation after eating chicken. Difficulty swallowing.  EXAM: NECK SOFT TISSUES - 1+ VIEW  COMPARISON:  None.  FINDINGS: There is no evidence of retropharyngeal soft tissue swelling or epiglottic enlargement. The cervical airway is unremarkable. No subcutaneous emphysema. Soft tissues are non suspicious. No radio-opaque foreign body identified. Patient is edentulous.  IMPRESSION: Negative soft tissue neck radiographs. No radiopaque foreign  body in the neck.  Electronically Signed   By: Keith Rake M.D.   On: 10/27/2018 00:37 Note: Reviewed        Physical Exam  General appearance: Well nourished, well developed, and well hydrated. In no apparent acute distress Mental status: Alert, oriented x 3 (person, place, & time)       Respiratory: No evidence of acute respiratory distress Eyes: PERLA Vitals: BP (!) 152/96   Pulse 86   Temp (!) 97.2 F (36.2 C) (Temporal)   Resp 16   Ht $R'5\' 8"'hB$  (1.727 m)   Wt 201 lb (91.2 kg)   SpO2 98%   BMI 30.56 kg/m  BMI: Estimated body mass index is 30.56 kg/m as calculated from the following:   Height as of this encounter: $RemoveBeforeD'5\' 8"'RKIfgLMIFgSwvo$  (1.727 m).   Weight as of this encounter: 201 lb (91.2 kg). Ideal: Ideal body weight: 68.4 kg (150 lb 12.7 oz) Adjusted ideal body weight: 77.5 kg (170 lb 14 oz)  Lumbar Spine Area Exam  Skin & Axial Inspection: No masses, redness, or swelling Alignment: Symmetrical Functional ROM: Unrestricted ROM       Stability: No instability detected Muscle Tone/Strength: Functionally intact. No obvious neuro-muscular anomalies detected. Sensory (Neurological): Low back pain, musculoskeletal, right greater than left, L4/L5 region.    Gait & Posture Assessment  Ambulation: Limited, prosthesis in place Gait: Antalgic gait (limping) Posture: Difficulty with positional changes    Lower Extremity Exam      Side: Right lower extremity   Side: Left lower extremity  Stability: No instability observed           Stability: No instability observed          Skin & Extremity Inspection: Above knee amputation (AKA)   Skin & Extremity Inspection: Skin color, temperature, and hair growth are WNL. No peripheral edema or cyanosis. No masses, redness, swelling, asymmetry, or associated skin lesions. No contractures.  Functional ROM: Decreased ROM             Functional ROM: Unrestricted ROM            Muscle Tone/Strength: Functionally intact. No obvious neuro-muscular  anomalies detected.   Muscle Tone/Strength: Functionally intact. No obvious neuro-muscular anomalies detected.  Sensory (Neurological): Neuropathic pain pattern   Sensory (Neurological): Unimpaired  Palpation: No palpable anomalies   Palpation: No palpable anomalies   Assessment   Diagnosis  1. Chronic pain syndrome   2. 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([place])   3. Phantom limb pain ([place])   4. Congenital neutropenia (HCC)   5. Chronic pain of right lower extremity   6. Amputation of right upper extremity, sequela ([place])      Plan of Care   Mr. JUSTINN WELTER has a current medication list which includes the following long-term medication(s): gabapentin, gabapentin, loratadine, losartan, metoprolol succinate,  nortriptyline, omeprazole, and ventolin hfa.  Continue multimodal analgesics with gabapentin and meloxicam and nortriptyline as prescribed by PCP.  Pharmacotherapy (Medications Ordered): Meds ordered this encounter  Medications   HYDROcodone-acetaminophen (NORCO/VICODIN) 5-325 MG tablet    Sig: Take 1 tablet by mouth daily as needed for severe pain. Must last 30 days.    Dispense:  45 tablet    Refill:  0    Chronic Pain: STOP Act (Not applicable) Fill 1 day early if closed on refill date. Avoid benzodiazepines within 8 hours of opioids   HYDROcodone-acetaminophen (NORCO/VICODIN) 5-325 MG tablet    Sig: Take 1 tablet by mouth daily as needed for severe pain. Must last 30 days.    Dispense:  30 tablet    Refill:  0    Chronic Pain: STOP Act (Not applicable) Fill 1 day early if closed on refill date. Avoid benzodiazepines within 8 hours of opioids   HYDROcodone-acetaminophen (NORCO/VICODIN) 5-325 MG tablet    Sig: Take 1 tablet by mouth daily as needed for severe pain. Must last 30 days.    Dispense:  30 tablet    Refill:  0    Chronic Pain: STOP Act (Not applicable) Fill 1 day early if closed on refill date. Avoid benzodiazepines within 8 hours of opioids   Alpha-Lipoic Acid 600 MG CAPS     Sig: Take 1 capsule (600 mg total) by mouth daily.    Dispense:  30 capsule    Refill:  0    Do not place medication on "Automatic Refill". Fill one day early if pharmacy is closed on scheduled refill date.    Follow-up plan:   Return in about 3 months (around 06/25/2021) for Medication Management, in person.   Recent Visits No visits were found meeting these conditions. Showing recent visits within past 90 days and meeting all other requirements Today's Visits Date Type Provider Dept  03/25/21 Office Visit Brandon Santa, MD [facility]  Showing today's visits and meeting all other requirements Future Appointments Date Type Provider Dept  06/22/21 Appointment Brandon Santa, MD [facility]  Showing future appointments within next 90 days and meeting all other requirements I discussed the assessment and treatment plan with the patient. The patient was provided an opportunity to ask questions and all were answered. The patient agreed with the plan and demonstrated an understanding of the instructions.  Patient advised to call back or seek an in-person evaluation if the symptoms or condition worsens.  Duration of encounter:30 minutes.  Note by: Brandon Santa, MD Date: 03/25/2021; Time: 2:05 PM

## 2021-03-25 NOTE — Progress Notes (Signed)
Nursing Pain Medication Assessment:  Safety precautions to be maintained throughout the outpatient stay will include: orient to surroundings, keep bed in low position, maintain call bell within reach at all times, provide assistance with transfer out of bed and ambulation.  Medication Inspection Compliance: Brandon Myers did not comply with our request to bring his pills to be counted. He was reminded that bringing the medication bottles, even when empty, is a requirement.  Medication: None brought in. Pill/Patch Count: None available to be counted. Bottle Appearance: No container available. Did not bring bottle(s) to appointment. Filled Date: N/A Last Medication intake:  Today

## 2021-03-31 DIAGNOSIS — D700 Congenital agranulocytosis: Secondary | ICD-10-CM | POA: Diagnosis not present

## 2021-04-03 LAB — TOXASSURE SELECT 13 (MW), URINE

## 2021-04-04 ENCOUNTER — Encounter: Payer: Self-pay | Admitting: Student in an Organized Health Care Education/Training Program

## 2021-04-04 DIAGNOSIS — G894 Chronic pain syndrome: Secondary | ICD-10-CM

## 2021-04-04 DIAGNOSIS — G546 Phantom limb syndrome with pain: Secondary | ICD-10-CM

## 2021-04-04 DIAGNOSIS — S88919A Complete traumatic amputation of unspecified lower leg, level unspecified, initial encounter: Secondary | ICD-10-CM

## 2021-04-04 DIAGNOSIS — D700 Congenital agranulocytosis: Secondary | ICD-10-CM

## 2021-04-05 ENCOUNTER — Encounter (INDEPENDENT_AMBULATORY_CARE_PROVIDER_SITE_OTHER): Payer: Self-pay

## 2021-04-06 ENCOUNTER — Telehealth: Payer: Self-pay

## 2021-04-06 MED ORDER — TIZANIDINE HCL 4 MG PO TABS: 4.0000 mg | ORAL_TABLET | Freq: Three times a day (TID) | ORAL | 0 refills | Status: AC | PRN

## 2021-04-06 NOTE — Telephone Encounter (Signed)
Pt state the lower dosage is not working please call in regards to switching medication Gabapentin

## 2021-04-06 NOTE — Telephone Encounter (Signed)
Patient notified via telephone call that he was to take the Tizanidine instead of the flexeril and if he was having trouble weaning from the opioid, he needed to make a face to face appointment.

## 2021-04-06 NOTE — Telephone Encounter (Signed)
Requested Prescriptions   Signed Prescriptions Disp Refills   tiZANidine (ZANAFLEX) 4 MG tablet 90 tablet 0    Sig: Take 1 tablet (4 mg total) by mouth every 8 (eight) hours as needed for muscle spasms.    Authorizing Provider: Edward Jolly

## 2021-04-19 ENCOUNTER — Telehealth: Payer: Self-pay | Admitting: Internal Medicine

## 2021-04-19 NOTE — Telephone Encounter (Signed)
Copied from CRM [id number]. Topic: Referral - Request for Referral >> Apr 19, 2021  9:44 AM Jaquita Rector A wrote: Has patient seen PCP for this complaint? Yes.   *If NO, is insurance requiring patient see PCP for this issue before PCP can refer them? Referral for which specialty: Neurologist  Preferred provider/office: none chosen  Reason for referral: foot issues

## 2021-04-19 NOTE — Telephone Encounter (Signed)
I contacted the patient and he complains of constant left side foot pain. He describe it as a burning, tingling feeling on the bottom of his left foot that radiates through his toes.

## 2021-04-19 NOTE — Telephone Encounter (Signed)
The pt was scheduled for an appt with Rene Kocher to discuss left side foot pain and referral to neurology.

## 2021-04-19 NOTE — Telephone Encounter (Signed)
He needs to be seen here before referral to neurology is placed.

## 2021-04-19 NOTE — Telephone Encounter (Signed)
I need more information about why he wants to see a neurologist for his foot issues. What specific foot issues? Phantom limp pain? I think he is following with pain management for this.

## 2021-04-26 ENCOUNTER — Ambulatory Visit
Admission: RE | Admit: 2021-04-26 | Discharge: 2021-04-26 | Disposition: A | Discharge status: Home / Self Care | Payer: Medicare Other | Attending: Internal Medicine | Admitting: Internal Medicine

## 2021-04-26 ENCOUNTER — Ambulatory Visit
Admission: RE | Admit: 2021-04-26 | Discharge: 2021-04-26 | Disposition: A | Discharge status: Home / Self Care | Payer: Medicare Other | Source: Ambulatory Visit | Attending: Internal Medicine | Admitting: Internal Medicine

## 2021-04-26 ENCOUNTER — Ambulatory Visit (INDEPENDENT_AMBULATORY_CARE_PROVIDER_SITE_OTHER): Payer: Medicare Other | Admitting: Internal Medicine

## 2021-04-26 ENCOUNTER — Encounter: Payer: Self-pay | Admitting: Internal Medicine

## 2021-04-26 ENCOUNTER — Other Ambulatory Visit: Payer: Self-pay

## 2021-04-26 VITALS — BP 136/85 | Temp 97.6°F | Resp 19 | Ht 68.0 in | Wt 205.6 lb

## 2021-04-26 DIAGNOSIS — M545 Low back pain, unspecified: Secondary | ICD-10-CM

## 2021-04-26 DIAGNOSIS — R202 Paresthesia of skin: Secondary | ICD-10-CM

## 2021-04-26 DIAGNOSIS — Z23 Encounter for immunization: Secondary | ICD-10-CM

## 2021-04-26 DIAGNOSIS — E559 Vitamin D deficiency, unspecified: Secondary | ICD-10-CM | POA: Diagnosis not present

## 2021-04-26 DIAGNOSIS — G8929 Other chronic pain: Secondary | ICD-10-CM

## 2021-04-26 DIAGNOSIS — Z6831 Body mass index (BMI) 31.0-31.9, adult: Secondary | ICD-10-CM

## 2021-04-26 DIAGNOSIS — E538 Deficiency of other specified B group vitamins: Secondary | ICD-10-CM

## 2021-04-26 DIAGNOSIS — Z6830 Body mass index (BMI) 30.0-30.9, adult: Secondary | ICD-10-CM | POA: Insufficient documentation

## 2021-04-26 DIAGNOSIS — E6609 Other obesity due to excess calories: Secondary | ICD-10-CM

## 2021-04-26 DIAGNOSIS — M5137 Other intervertebral disc degeneration, lumbosacral region: Secondary | ICD-10-CM | POA: Diagnosis not present

## 2021-04-26 DIAGNOSIS — M5136 Other intervertebral disc degeneration, lumbar region: Secondary | ICD-10-CM | POA: Diagnosis not present

## 2021-04-26 DIAGNOSIS — M2578 Osteophyte, vertebrae: Secondary | ICD-10-CM | POA: Diagnosis not present

## 2021-04-26 DIAGNOSIS — M5032 Other cervical disc degeneration, mid-cervical region, unspecified level: Secondary | ICD-10-CM | POA: Diagnosis not present

## 2021-04-26 MED ORDER — ALBUTEROL SULFATE HFA 108 (90 BASE) MCG/ACT IN AERS: INHALATION_SPRAY | RESPIRATORY_TRACT | 12 refills | Status: DC

## 2021-04-26 NOTE — Assessment & Plan Note (Signed)
Encouraged diet and exercise for weight loss ?

## 2021-04-26 NOTE — Progress Notes (Signed)
Subjective:    Patient ID: Brandon Myers, male    DOB: 31-Jan-1966, 55 y.o.   MRN: [id number]  HPI  Patient presents the clinic today with intermittent numbness and  tingling in his left arm. He noticed this 6 months ago. He reports it feels like his arm is "asleep". He denies any neck pain or prior neck injury. He has not taken anything OTC for this.  He also reports numbness and tinging in his left foot. He noticed this 1 year ago. He reports shooting pains in the toes of his left foot and feels like his left foot is cool to touch. He does have intermittent back pain if he stands for too long. He denies any prior back injuries or surgeries.   He is not diabetic. He does have elevated B12.  Review of Systems  Past Medical History:  Diagnosis Date   Allergy    Anxiety 01/21/2020   Controlled substance agreement signed 04/02/2017   GERD (gastroesophageal reflux disease)    Hypertension    Neutropenia, congenital (HCC)    Phantom limb pain (HCC)     Current Outpatient Medications  Medication Sig Dispense Refill   acetaminophen (TYLENOL) 500 MG tablet Take 1,000 mg by mouth every 6 (six) hours as needed.     azithromycin (ZITHROMAX) 250 MG tablet Take 2 tabs today, then 1 tab daily x 4 days 6 tablet 0   filgrastim-sndz (ZARXIO) 480 MCG/0.8ML SOSY injection Inject 0.8 mLs into the muscle every other day.     gabapentin (NEURONTIN) 300 MG capsule Take 1 capsule (300 mg total) by mouth daily. 90 capsule 0   gabapentin (NEURONTIN) 800 MG tablet Take 1 tablet (800 mg total) by mouth 3 (three) times daily. 270 tablet 0   HYDROcodone-acetaminophen (NORCO) 10-325 MG tablet SMARTSIG:1-2 Tablet(s) By Mouth Every 12 Hours PRN     HYDROcodone-acetaminophen (NORCO/VICODIN) 5-325 MG tablet Take 1 tablet by mouth daily as needed for severe pain. Must last 30 days. 30 tablet 0   [START ON 05/24/2021] HYDROcodone-acetaminophen (NORCO/VICODIN) 5-325 MG tablet Take 1 tablet by mouth daily as needed for  severe pain. Must last 30 days. 30 tablet 0   loratadine (CLARITIN) 10 MG tablet TAKE 1 TABLET(10 MG) BY MOUTH DAILY 90 tablet 3   losartan (COZAAR) 100 MG tablet TAKE 1 TABLET BY MOUTH EVERY DAY 90 tablet 1   meloxicam (MOBIC) 7.5 MG tablet Take 1 tablet (7.5 mg total) by mouth daily. 90 tablet 1   metoprolol succinate (TOPROL-XL) 100 MG 24 hr tablet TAKE 1 TABLET BY MOUTH EVERY DAY WITH OR IMMEDIATELY FOLLOWING A MEAL 90 tablet 0   nortriptyline (PAMELOR) 25 MG capsule TAKE 1 CAPSULE(25 MG) BY MOUTH AT BEDTIME 90 capsule 1   omeprazole (PRILOSEC) 40 MG capsule TAKE 1 CAPSULE(40 MG) BY MOUTH DAILY 90 capsule 0   predniSONE (DELTASONE) 10 MG tablet Take 6 tabs on day 1, 5 tabs on day 2, 4 tabs on day 3, 3 tabs on day 4, 2 tabs on day 5, 1 tab on day 6 21 tablet 0   tiZANidine (ZANAFLEX) 4 MG tablet Take 1 tablet (4 mg total) by mouth every 8 (eight) hours as needed for muscle spasms. 90 tablet 0   VENTOLIN HFA 108 (90 Base) MCG/ACT inhaler INHALE 2 PUFFS BY MOUTH FOUR TIMES DAILY AS NEEDED 18 g 12   No current facility-administered medications for this visit.    Allergies  Allergen Reactions   Enalapril Cough  ACEi Cough   Hydrochlorothiazide     Sweats, hot flashes   Prochlorperazine     Other reaction(s): Other (See Comments) As a child unable to recall   Sulfa Antibiotics Other (See Comments)    Other reaction(s): Other (See Comments) As a child unable to recall   Benadryl [Diphenhydramine]     Increased phantom pain   Strawberry Extract Rash    Family History  Problem Relation Age of Onset   Heart Problems Mother    Asthma Mother    Depression Mother    Thyroid disease Mother    Alzheimer's disease Mother    Hypertension Mother    Stroke Maternal Grandmother    Cancer Maternal Grandmother    Cancer Father        skin   Hypertension Father    Cancer Paternal Grandmother    Cancer Paternal Grandfather     Social History   Socioeconomic History   Marital status:  Married    Spouse name: Not on file   Number of children: Not on file   Years of education: Not on file   Highest education level: Associate degree: academic program  Occupational History   Not on file  Tobacco Use   Smoking status: Never   Smokeless tobacco: Never  Vaping Use   Vaping Use: Never used  Substance and Sexual Activity   Alcohol use: Never    Alcohol/week: 0.0 standard drinks   Drug use: Never   Sexual activity: Never  Other Topics Concern   Not on file  Social History Narrative   Not on file   Social Determinants of Health   Financial Resource Strain: Not on file  Food Insecurity: Not on file  Transportation Needs: Not on file  Physical Activity: Not on file  Stress: Not on file  Social Connections: Not on file  Intimate Partner Violence: Not on file     Constitutional: Denies fever, malaise, fatigue, headache or abrupt weight changes.  Respiratory: Denies difficulty breathing, shortness of breath, cough or sputum production.   Cardiovascular: Denies chest pain, chest tightness, palpitations or swelling in the hands or feet.  Musculoskeletal: Pt reports low back pain. Denies decrease in range of motion, difficulty with gait, muscle pain or joint swelling.  Skin: Denies redness, rashes, lesions or ulcercations.  Neurological: Pt reports paresthesia of left upper and lower extremity. Denies dizziness, difficulty with memory, difficulty with speech.    No other specific complaints in a complete review of systems (except as listed in HPI above).     Objective:   Physical Exam  BP 136/85 (BP Location: Left Arm, Patient Position: Sitting, Cuff Size: Normal)   Temp 97.6 F (36.4 C) (Temporal)   Resp 19   Ht 5\' 8"  (1.727 m)   Wt 205 lb 9.6 oz (93.3 kg)   SpO2 100%   BMI 31.26 kg/m   Wt Readings from Last 3 Encounters:  03/25/21 201 lb (91.2 kg)  01/11/21 207 lb (93.9 kg)  12/08/20 195 lb (88.5 kg)    General: Appears his stated age, obese, in  NAD. Skin: Warm, dry and intact. No rashes noted. HEENT: Head: normal shape and size Neck:  Neck supple, trachea midline. No masses, lumps or thyromegaly present.  Cardiovascular: Normal rate and rhythm. S1,S2 noted.  No murmur, rubs or gallops noted. Radial pulse 2+ on the left. Pedal pulse 2+ on the left. Pulmonary/Chest: Normal effort and positive vesicular breath sounds. No respiratory distress. No wheezes, rales or ronchi  noted.  Musculoskeletal: Normal flexion, extension and rotation of the sine. No bony tenderness noted over the spine. Absence of right upper extremity. Prosthesis of right lower extremity. Gait slow and steady without device. Neurological: Negative Phalen's. Negative Tinel's. Decreased sensation noted of LLE.   BMET    Component Value Date/Time   NA 139 12/23/2019 1447   K 4.6 12/23/2019 1447   CL 99 12/23/2019 1447   CO2 24 12/23/2019 1447   GLUCOSE 89 12/23/2019 1447   GLUCOSE 114 (H) 10/26/2018 2318   BUN 13 12/23/2019 1447   CREATININE 0.86 12/23/2019 1447   CREATININE 0.79 12/09/2016 0934   CALCIUM 9.4 12/23/2019 1447   GFRNONAA 100 12/23/2019 1447   GFRNONAA >89 12/09/2016 0934   GFRAA 116 12/23/2019 1447   GFRAA >89 12/09/2016 0934    Lipid Panel     Component Value Date/Time   CHOL 139 12/23/2019 1447   TRIG 231 (H) 12/23/2019 1447   HDL 23 (L) 12/23/2019 1447   CHOLHDL 6.4 (H) 12/09/2016 0934   VLDL 29 12/09/2016 0934   LDLCALC 77 12/23/2019 1447    CBC    Component Value Date/Time   WBC 5.4 11/27/2018 1344   WBC 6.8 10/26/2018 2318   RBC 5.05 11/27/2018 1344   RBC 4.53 10/26/2018 2318   HGB 14.1 11/27/2018 1344   HCT 41.8 11/27/2018 1344   PLT 200 11/27/2018 1344   MCV 83 11/27/2018 1344   MCH 27.9 11/27/2018 1344   MCH 27.4 10/26/2018 2318   MCHC 33.7 11/27/2018 1344   MCHC 31.6 10/26/2018 2318   RDW 13.1 11/27/2018 1344   LYMPHSABS 1.9 11/27/2018 1344   MONOABS 1.4 (H) 10/26/2018 2318   EOSABS 0.5 (H) 11/27/2018 1344    BASOSABS 0.2 11/27/2018 1344    Hgb A1C Lab Results  Component Value Date   HGBA1C 5.1 12/09/2016           Assessment & Plan:  Chronic Low Back Pain, Paresthesia of LUE and LLE, Vit B12 Disorder:  Not diabetic TSH WNL Will check Vit D and B12 Xray cervical and lumbar spine for further evaluation of symptoms Consider referral to neurology for EMG testing  Will follow up after labs and imaging, return precautions discussed Nicki Reaper, NP This visit occurred during the SARS-CoV-2 public health emergency.  Safety protocols were in place, including screening questions prior to the visit, additional usage of staff PPE, and extensive cleaning of exam room while observing appropriate contact time as indicated for disinfecting solutions.

## 2021-04-26 NOTE — Patient Instructions (Signed)
Paresthesia Paresthesia is a burning or prickling feeling. This feeling can happen in any part of the body. It often happens in the hands, arms, legs, or feet. Usually, it is not painful. In most cases, the feeling goes away in a short time and is not a sign of a serious problem. If you have paresthesia that lasts a longtime, you need to be seen by your doctor. Follow these instructions at home: Alcohol use  Do not drink alcohol if: Your doctor tells you not to drink. You are pregnant, may be pregnant, or are planning to become pregnant. If you drink alcohol: Limit how much you use to: 0-1 drink a day for women. 0-2 drinks a day for men. Be aware of how much alcohol is in your drink. In the [place] one drink equals one 12 oz bottle of beer (355 mL), one 5 oz glass of wine (148 mL), or one 1 oz glass of hard liquor (44 mL).  Nutrition  Eat a healthy diet. This includes: Eating foods that have a lot of fiber in them, such as fresh fruits and vegetables, whole grains, and beans. Limiting foods that have a lot of fat and processed sugars in them, such as fried or sweet foods.  General instructions Take over-the-counter and prescription medicines only as told by your doctor. Do not use any products that have nicotine or tobacco in them, such as cigarettes and e-cigarettes. If you need help quitting, ask your doctor. If you have diabetes, work with your doctor to make sure your blood sugar stays in a healthy range. If your feet feel numb: Check for redness, warmth, and swelling every day. Wear padded socks and comfortable shoes. These help protect your feet. Keep all follow-up visits as told by your doctor. This is important. Contact a doctor if: You have paresthesia that gets worse or does not go away. Lose feeling (have numbness) after an injury. Your burning or prickling feeling gets worse when you walk. You have pain or cramps. You feel dizzy or pass out (faint). You have a rash. Get  help right away if you: Feel weak or have new weakness in an arm or leg. Have trouble walking or moving. Have problems speaking, understanding, or seeing. Feel confused. Cannot control when you pee (urinate) or poop (have a bowel movement). Summary Paresthesia is a burning or prickling feeling. It often happens in the hands, arms, legs, or feet. In most cases, the feeling goes away in a short time and is not a sign of a serious problem. If you have paresthesia that lasts a long time, you need to be seen by your doctor. This information is not intended to replace advice given to you by your health care provider. Make sure you discuss any questions you have with your healthcare provider. Document Revised: 04/28/2020 Document Reviewed: 04/28/2020 Elsevier Patient Education  2022 [employer]vier Inc.  

## 2021-04-27 DIAGNOSIS — M545 Low back pain, unspecified: Secondary | ICD-10-CM | POA: Diagnosis not present

## 2021-04-27 DIAGNOSIS — Z23 Encounter for immunization: Secondary | ICD-10-CM | POA: Diagnosis not present

## 2021-04-27 LAB — VITAMIN B12: Vitamin B-12: 1711 pg/mL — ABNORMAL HIGH (ref 200–1100)

## 2021-04-27 LAB — VITAMIN D 25 HYDROXY (VIT D DEFICIENCY, FRACTURES): Vit D, 25-Hydroxy: 38 ng/mL (ref 30–100)

## 2021-04-28 DIAGNOSIS — Z89611 Acquired absence of right leg above knee: Secondary | ICD-10-CM | POA: Diagnosis not present

## 2021-05-11 ENCOUNTER — Other Ambulatory Visit: Payer: Self-pay | Admitting: Student in an Organized Health Care Education/Training Program

## 2021-05-11 DIAGNOSIS — G894 Chronic pain syndrome: Secondary | ICD-10-CM

## 2021-06-14 ENCOUNTER — Other Ambulatory Visit: Payer: Self-pay | Admitting: Family Medicine

## 2021-06-14 NOTE — Telephone Encounter (Signed)
Requested Prescriptions  Pending Prescriptions Disp Refills  . metoprolol succinate (TOPROL-XL) 100 MG 24 hr tablet [Pharmacy Med Name: METOPROLOL ER SUCCINATE 100MG  TABS] 90 tablet 0    Sig: TAKE 1 TABLET BY MOUTH EVERY DAY WITH OR IMMEDIATELY FOLLOWING A MEAL     Cardiovascular:  Beta Blockers Passed - 06/14/2021 12:22 PM      Passed - Last BP in normal range    BP Readings from Last 1 Encounters:  04/26/21 136/85         Passed - Last Heart Rate in normal range    Pulse Readings from Last 1 Encounters:  03/25/21 86         Passed - Valid encounter within last 6 months    Recent Outpatient Visits          1 month ago Chronic midline low back pain, unspecified whether sciatica present   [facility] [place], Mull[place], NP   3 months ago Exposure to COVID-19 virus   [facility], [facility], [facility]   5 months ago Essential hypertension   [facility] [place], Mullins, NP   10 months ago Elevated alkaline phosphatase level   [facility], [facility], Jodelle Gross   12 months ago Encounter to establish care with new doctor   [facility], [facility], FNP      Future Appointments            In 1 month Baity, Jodelle Gross, NP [facility], [facility]

## 2021-06-22 ENCOUNTER — Telehealth: Payer: Self-pay

## 2021-06-22 ENCOUNTER — Encounter: Payer: Medicare Other | Admitting: Student in an Organized Health Care Education/Training Program

## 2021-06-29 ENCOUNTER — Other Ambulatory Visit: Payer: Self-pay | Admitting: Family Medicine

## 2021-06-29 ENCOUNTER — Other Ambulatory Visit: Payer: Self-pay | Admitting: Internal Medicine

## 2021-06-29 DIAGNOSIS — G546 Phantom limb syndrome with pain: Secondary | ICD-10-CM

## 2021-06-29 NOTE — Telephone Encounter (Signed)
Medication Refill - Medication: gabapentin (NEURONTIN) 300 MG capsule  Has the patient contacted their pharmacy? Yes.     Preferred Pharmacy (with phone number or street name):  [id number] - [place], [place] - [address] AT [facility] [address] [facility] Phone:  [phone]  Fax:  [phone]     Has the patient been seen for an appointment in the last year OR does the patient have an upcoming appointment? Yes.   04/26/21  Agent: Please be advised that RX refills may take up to 3 business days. We ask that you follow-up with your pharmacy.

## 2021-06-30 MED ORDER — GABAPENTIN 300 MG PO CAPS: 300.0000 mg | ORAL_CAPSULE | Freq: Every day | ORAL | 0 refills | Status: DC

## 2021-06-30 NOTE — Telephone Encounter (Signed)
Requested Prescriptions  Pending Prescriptions Disp Refills  . gabapentin (NEURONTIN) 300 MG capsule 90 capsule 0    Sig: Take 1 capsule (300 mg total) by mouth daily.     Neurology: Anticonvulsants - gabapentin Passed - 06/29/2021 11:40 AM      Passed - Valid encounter within last 12 months    Recent Outpatient Visits          2 months ago Chronic midline low back pain, unspecified whether sciatica present   [facility] [place], Salvadore Oxford, NP   4 months ago Exposure to COVID-19 virus   [facility], Salvadore Oxford, NP   5 months ago Essential hypertension   [facility] [place], Salvadore Oxford, NP   10 months ago Elevated alkaline phosphatase level   [facility], Jodelle Gross, FNP   1 year ago Encounter to establish care with new doctor   [facility], Jodelle Gross, FNP      Future Appointments            In 4 weeks Sampson Si, Salvadore Oxford, NP [facility], [facility]

## 2021-07-11 ENCOUNTER — Other Ambulatory Visit: Payer: Self-pay | Admitting: Internal Medicine

## 2021-07-11 NOTE — Telephone Encounter (Signed)
Requested Prescriptions  Pending Prescriptions Disp Refills  . nortriptyline (PAMELOR) 25 MG capsule [Pharmacy Med Name: NORTRIPTYLINE 25MG  CAPSULES] 90 capsule 0    Sig: TAKE 1 CAPSULE(25 MG) BY MOUTH AT BEDTIME     Psychiatry:  Antidepressants - Heterocyclics (TCAs) Passed - 07/11/2021 11:19 AM      Passed - Valid encounter within last 6 months    Recent Outpatient Visits          2 months ago Chronic midline low back pain, unspecified whether sciatica present   [facility] [place], Mullins, NP   4 months ago Exposure to COVID-19 virus   [facility], [facility], NP   6 months ago Essential hypertension   [facility] [place], Mullins, NP   11 months ago Elevated alkaline phosphatase level   [facility], [facility], FNP   1 year ago Encounter to establish care with new doctor   [facility], [facility], FNP      Future Appointments            In 2 weeks Jodelle Gross, Sampson Si, NP [facility], [facility]

## 2021-07-28 ENCOUNTER — Encounter: Payer: Medicare Other | Admitting: Internal Medicine

## 2021-08-03 ENCOUNTER — Encounter: Payer: Medicare Other | Admitting: Student in an Organized Health Care Education/Training Program

## 2021-08-12 ENCOUNTER — Encounter: Payer: Self-pay | Admitting: Internal Medicine

## 2021-08-12 ENCOUNTER — Other Ambulatory Visit: Payer: Self-pay

## 2021-08-12 ENCOUNTER — Ambulatory Visit (INDEPENDENT_AMBULATORY_CARE_PROVIDER_SITE_OTHER): Payer: Medicare Other | Admitting: Internal Medicine

## 2021-08-12 VITALS — BP 125/88 | HR 88 | Temp 97.8°F | Resp 18 | Ht 68.0 in | Wt 202.8 lb

## 2021-08-12 DIAGNOSIS — Z23 Encounter for immunization: Secondary | ICD-10-CM | POA: Diagnosis not present

## 2021-08-12 DIAGNOSIS — Z1159 Encounter for screening for other viral diseases: Secondary | ICD-10-CM | POA: Diagnosis not present

## 2021-08-12 DIAGNOSIS — Z6830 Body mass index (BMI) 30.0-30.9, adult: Secondary | ICD-10-CM

## 2021-08-12 DIAGNOSIS — E6609 Other obesity due to excess calories: Secondary | ICD-10-CM | POA: Diagnosis not present

## 2021-08-12 DIAGNOSIS — E785 Hyperlipidemia, unspecified: Secondary | ICD-10-CM | POA: Diagnosis not present

## 2021-08-12 DIAGNOSIS — Z0001 Encounter for general adult medical examination with abnormal findings: Secondary | ICD-10-CM

## 2021-08-12 NOTE — Patient Instructions (Signed)

## 2021-08-12 NOTE — Progress Notes (Signed)
Subjective:    Patient ID: Brandon GrapesKevin L Myers, male    DOB: 02/29/1966, 56 y.o.   MRN: [id number]  HPI  Pt presents to the clinic today for his annual exam.  Flu: 04/2021 Tetanus: unsure Prevnar: 12/2016 Shingrix: never PSA screening: 11/2020 Colon screening: > 10 year ago Vision screening: annually Dentist: as needed  Diet: He does eat some meat. He consumes fruits more than veggies. He does eat some fried foods. He drinks mostly coffee, tea. Exercise: None  Review of Systems     Past Medical History:  Diagnosis Date   Allergy    Anxiety 01/21/2020   Controlled substance agreement signed 04/02/2017   GERD (gastroesophageal reflux disease)    Hypertension    Neutropenia, congenital (HCC)    Phantom limb pain (HCC)     Current Outpatient Medications  Medication Sig Dispense Refill   acetaminophen (TYLENOL) 500 MG tablet Take 1,000 mg by mouth every 6 (six) hours as needed.     albuterol (VENTOLIN HFA) 108 (90 Base) MCG/ACT inhaler INHALE 2 PUFFS BY MOUTH FOUR TIMES DAILY AS NEEDED 18 g 12   filgrastim-sndz (ZARXIO) 480 MCG/0.8ML SOSY injection Inject 0.8 mLs into the muscle every other day.     gabapentin (NEURONTIN) 300 MG capsule Take 1 capsule (300 mg total) by mouth daily. 90 capsule 0   gabapentin (NEURONTIN) 800 MG tablet Take 1 tablet (800 mg total) by mouth 3 (three) times daily. 270 tablet 0   HYDROcodone-acetaminophen (NORCO) 10-325 MG tablet SMARTSIG:1-2 Tablet(s) By Mouth Every 12 Hours PRN     loratadine (CLARITIN) 10 MG tablet TAKE 1 TABLET(10 MG) BY MOUTH DAILY 90 tablet 3   losartan (COZAAR) 100 MG tablet TAKE 1 TABLET BY MOUTH EVERY DAY 90 tablet 1   meloxicam (MOBIC) 7.5 MG tablet Take 1 tablet (7.5 mg total) by mouth daily. 90 tablet 1   metoprolol succinate (TOPROL-XL) 100 MG 24 hr tablet TAKE 1 TABLET BY MOUTH EVERY DAY WITH OR IMMEDIATELY FOLLOWING A MEAL 90 tablet 0   nortriptyline (PAMELOR) 25 MG capsule TAKE 1 CAPSULE(25 MG) BY MOUTH AT BEDTIME 90 capsule  0   omeprazole (PRILOSEC) 40 MG capsule TAKE 1 CAPSULE(40 MG) BY MOUTH DAILY 90 capsule 0   predniSONE (DELTASONE) 10 MG tablet Take 6 tabs on day 1, 5 tabs on day 2, 4 tabs on day 3, 3 tabs on day 4, 2 tabs on day 5, 1 tab on day 6 (Patient not taking: Reported on 04/26/2021) 21 tablet 0   No current facility-administered medications for this visit.    Allergies  Allergen Reactions   Enalapril Cough    ACEi Cough   Hydrochlorothiazide     Sweats, hot flashes   Prochlorperazine     Other reaction(s): Other (See Comments) As a child unable to recall   Sulfa Antibiotics Other (See Comments)    Other reaction(s): Other (See Comments) As a child unable to recall   Benadryl [Diphenhydramine]     Increased phantom pain   Strawberry Extract Rash    Family History  Problem Relation Age of Onset   Heart Problems Mother    Asthma Mother    Depression Mother    Thyroid disease Mother    Alzheimer's disease Mother    Hypertension Mother    Stroke Maternal Grandmother    Cancer Maternal Grandmother    Cancer Father        skin   Hypertension Father    Cancer Paternal Grandmother  Cancer Paternal Grandfather     Social History   Socioeconomic History   Marital status: Married    Spouse name: Not on file   Number of children: Not on file   Years of education: Not on file   Highest education level: Associate degree: academic program  Occupational History   Not on file  Tobacco Use   Smoking status: Never   Smokeless tobacco: Never  Vaping Use   Vaping Use: Never used  Substance and Sexual Activity   Alcohol use: Never    Alcohol/week: 0.0 standard drinks   Drug use: Never   Sexual activity: Never  Other Topics Concern   Not on file  Social History Narrative   Not on file   Social Determinants of Health   Financial Resource Strain: Not on file  Food Insecurity: Not on file  Transportation Needs: Not on file  Physical Activity: Not on file  Stress: Not on file   Social Connections: Not on file  Intimate Partner Violence: Not on file     Constitutional: Denies fever, malaise, fatigue, headache or abrupt weight changes.  HEENT: Denies eye pain, eye redness, ear pain, ringing in the ears, wax buildup, runny nose, nasal congestion, bloody nose, or sore throat. Respiratory: Denies difficulty breathing, shortness of breath, cough or sputum production.   Cardiovascular: Denies chest pain, chest tightness, palpitations or swelling in the hands or feet.  Gastrointestinal: Denies abdominal pain, bloating, constipation, diarrhea or blood in the stool.  GU: Denies urgency, frequency, pain with urination, burning sensation, blood in urine, odor or discharge. Musculoskeletal: Denies decrease in range of motion, difficulty with gait, muscle pain or joint pain and swelling.  Skin: Denies redness, rashes, lesions or ulcercations.  Neurological: Pt reports neuropathic pain. Denies dizziness, difficulty with memory, difficulty with speech or problems with balance and coordination.  Psych: Pt has a history of anxiety. Denies depression, SI/HI.  No other specific complaints in a complete review of systems (except as listed in HPI above).  Objective:   Physical Exam  BP 125/88 (BP Location: Left Arm, Patient Position: Sitting, Cuff Size: Normal)    Pulse 88    Temp 97.8 F (36.6 C) (Temporal)    Resp 18    Ht 5\' 8"  (1.727 m)    Wt 202 lb 12.8 oz (92 kg)    SpO2 99%    BMI 30.84 kg/m   Wt Readings from Last 3 Encounters:  04/26/21 205 lb 9.6 oz (93.3 kg)  03/25/21 201 lb (91.2 kg)  01/11/21 207 lb (93.9 kg)    General: Appears his stated age, obese, in NAD. Skin: Warm, dry and intact. Folliculitis noted of neck HEENT: Head: normal shape and size; Eyes: sclera white and EOMs intact;  Neck:  Neck supple, trachea midline. No masses, lumps or thyromegaly present.  Cardiovascular: Normal rate and rhythm. S1,S2 noted.  No murmur, rubs or gallops noted. No JVD or BLE  edema. No carotid bruits noted. Pulmonary/Chest: Normal effort and positive vesicular breath sounds. No respiratory distress. No wheezes, rales or ronchi noted.  Abdomen: Soft and nontender. Normal bowel sounds. No distention or masses noted.  Musculoskeletal: Absence noted of RUE/RLE. No difficulty with gait.  Neurological: Alert and oriented. Cranial nerves II-XII grossly intact.  Psychiatric: Mood and affect normal. Behavior is normal. Judgment and thought content normal.    BMET    Component Value Date/Time   NA 139 12/23/2019 1447   K 4.6 12/23/2019 1447   CL 99 12/23/2019  1447   CO2 24 12/23/2019 1447   GLUCOSE 89 12/23/2019 1447   GLUCOSE 114 (H) 10/26/2018 2318   BUN 13 12/23/2019 1447   CREATININE 0.86 12/23/2019 1447   CREATININE 0.79 12/09/2016 0934   CALCIUM 9.4 12/23/2019 1447   GFRNONAA 100 12/23/2019 1447   GFRNONAA >89 12/09/2016 0934   GFRAA 116 12/23/2019 1447   GFRAA >89 12/09/2016 0934    Lipid Panel     Component Value Date/Time   CHOL 139 12/23/2019 1447   TRIG 231 (H) 12/23/2019 1447   HDL 23 (L) 12/23/2019 1447   CHOLHDL 6.4 (H) 12/09/2016 0934   VLDL 29 12/09/2016 0934   LDLCALC 77 12/23/2019 1447    CBC    Component Value Date/Time   WBC 5.4 11/27/2018 1344   WBC 6.8 10/26/2018 2318   RBC 5.05 11/27/2018 1344   RBC 4.53 10/26/2018 2318   HGB 14.1 11/27/2018 1344   HCT 41.8 11/27/2018 1344   PLT 200 11/27/2018 1344   MCV 83 11/27/2018 1344   MCH 27.9 11/27/2018 1344   MCH 27.4 10/26/2018 2318   MCHC 33.7 11/27/2018 1344   MCHC 31.6 10/26/2018 2318   RDW 13.1 11/27/2018 1344   LYMPHSABS 1.9 11/27/2018 1344   MONOABS 1.4 (H) 10/26/2018 2318   EOSABS 0.5 (H) 11/27/2018 1344   BASOSABS 0.2 11/27/2018 1344    Hgb A1C Lab Results  Component Value Date   HGBA1C 5.1 12/09/2016            Assessment & Plan:   Preventative Health Maintenance:  Flu shot UTD He declines tetanus for financial reasons. Advised him if he gets bit  or cut to go get a tetanus vaccine Encouraged him to get his covid booster Discussed Shingrix vaccine, he will check coverage with his insurance provider and go to the pharmacy if he would like to get this PSA UTD He declines referral to GI for screening colonoscopy Encouraged him to consume a balanced diet and exercise regimen Advised him to see an eye doctor and dentist annually Will check  CBC, CMET, lipid, A1C and Hep C today  RTC in 6 months, follow up chronic conditions Nicki Reaper, NP This visit occurred during the SARS-CoV-2 public health emergency.  Safety protocols were in place, including screening questions prior to the visit, additional usage of staff PPE, and extensive cleaning of exam room while observing appropriate contact time as indicated for disinfecting solutions.

## 2021-08-12 NOTE — Assessment & Plan Note (Signed)
Encouraged diet and exercise for weight loss ?

## 2021-08-13 LAB — COMPLETE METABOLIC PANEL WITH GFR
AG Ratio: 1.4 (calc) (ref 1.0–2.5)
ALT: 26 U/L (ref 9–46)
AST: 32 U/L (ref 10–35)
Albumin: 4.2 g/dL (ref 3.6–5.1)
Alkaline phosphatase (APISO): 115 U/L (ref 35–144)
BUN: 13 mg/dL (ref 7–25)
CO2: 30 mmol/L (ref 20–32)
Calcium: 9.3 mg/dL (ref 8.6–10.3)
Chloride: 102 mmol/L (ref 98–110)
Creat: 0.76 mg/dL (ref 0.70–1.30)
Globulin: 2.9 g/dL (calc) (ref 1.9–3.7)
Glucose, Bld: 116 mg/dL (ref 65–139)
Potassium: 4.5 mmol/L (ref 3.5–5.3)
Sodium: 138 mmol/L (ref 135–146)
Total Bilirubin: 0.3 mg/dL (ref 0.2–1.2)
Total Protein: 7.1 g/dL (ref 6.1–8.1)
eGFR: 107 mL/min/{1.73_m2} (ref >=60)

## 2021-08-13 LAB — LIPID PANEL
Cholesterol: 117 mg/dL (ref <200)
HDL: 17 mg/dL — ABNORMAL LOW (ref >=40)
LDL Cholesterol (Calc): 60 mg/dL (calc)
Non-HDL Cholesterol (Calc): 100 mg/dL (calc) (ref <130)
Total CHOL/HDL Ratio: 6.9 (calc) — ABNORMAL HIGH (ref <5.0)
Triglycerides: 336 mg/dL — ABNORMAL HIGH (ref <150)

## 2021-08-13 LAB — CBC
HCT: 35.3 % — ABNORMAL LOW (ref 38.5–50.0)
Hemoglobin: 10.9 g/dL — ABNORMAL LOW (ref 13.2–17.1)
MCH: 24.3 pg — ABNORMAL LOW (ref 27.0–33.0)
MCHC: 30.9 g/dL — ABNORMAL LOW (ref 32.0–36.0)
MCV: 78.8 fL — ABNORMAL LOW (ref 80.0–100.0)
MPV: 10.4 fL (ref 7.5–12.5)
Platelets: 269 10*3/uL (ref 140–400)
RBC: 4.48 10*6/uL (ref 4.20–5.80)
RDW: 13.8 % (ref 11.0–15.0)
WBC: 6.5 10*3/uL (ref 3.8–10.8)

## 2021-08-13 LAB — HEPATITIS C ANTIBODY
Hepatitis C Ab: NONREACTIVE
SIGNAL TO CUT-OFF: 0.09 (ref <1.00)

## 2021-08-13 LAB — HEMOGLOBIN A1C
Hgb A1c MFr Bld: 5.5 % of total Hgb (ref <5.7)
Mean Plasma Glucose: 111 mg/dL
eAG (mmol/L): 6.2 mmol/L

## 2021-08-15 ENCOUNTER — Other Ambulatory Visit: Payer: Self-pay | Admitting: Family Medicine

## 2021-08-15 DIAGNOSIS — K219 Gastro-esophageal reflux disease without esophagitis: Secondary | ICD-10-CM

## 2021-08-15 NOTE — Telephone Encounter (Signed)
Requested Prescriptions  Pending Prescriptions Disp Refills   omeprazole (PRILOSEC) 40 MG capsule [Pharmacy Med Name: OMEPRAZOLE 40MG  CAPSULES] 90 capsule 0    Sig: TAKE 1 CAPSULE(40 MG) BY MOUTH DAILY     Gastroenterology: Proton Pump Inhibitors Passed - 08/15/2021  9:13 AM      Passed - Valid encounter within last 12 months    Recent Outpatient Visits          3 days ago Encounter for general adult medical examination with abnormal findings   [facility] [place], [place], NP   3 months ago Chronic midline low back pain, unspecified whether sciatica present   [facility] [place], Mullins, NP   5 months ago Exposure to COVID-19 virus   [facility], NP   7 months ago Essential hypertension   [facility] [place], Mullins, NP   1 year ago Elevated alkaline phosphatase level   [facility], [facility], FNP      Future Appointments            In 5 months Baity, Jodelle Gross, NP [facility], [facility]

## 2021-08-21 ENCOUNTER — Encounter: Payer: Self-pay | Admitting: Internal Medicine

## 2021-08-23 ENCOUNTER — Other Ambulatory Visit: Payer: Self-pay

## 2021-08-23 ENCOUNTER — Encounter: Payer: Self-pay | Admitting: Internal Medicine

## 2021-08-23 DIAGNOSIS — I10 Essential (primary) hypertension: Secondary | ICD-10-CM

## 2021-08-23 MED ORDER — TIZANIDINE HCL 4 MG PO TABS: 4.0000 mg | ORAL_TABLET | Freq: Every evening | ORAL | 0 refills | Status: DC | PRN

## 2021-08-23 MED ORDER — LOSARTAN POTASSIUM 100 MG PO TABS: 100.0000 mg | ORAL_TABLET | Freq: Every day | ORAL | 1 refills | Status: DC

## 2021-08-25 ENCOUNTER — Ambulatory Visit (INDEPENDENT_AMBULATORY_CARE_PROVIDER_SITE_OTHER): Payer: Medicare Other | Admitting: Internal Medicine

## 2021-08-25 ENCOUNTER — Other Ambulatory Visit: Payer: Self-pay

## 2021-08-25 ENCOUNTER — Encounter: Payer: Self-pay | Admitting: Internal Medicine

## 2021-08-25 VITALS — BP 138/92 | HR 76 | Temp 98.9°F | Ht 68.0 in | Wt 206.0 lb

## 2021-08-25 DIAGNOSIS — J069 Acute upper respiratory infection, unspecified: Secondary | ICD-10-CM

## 2021-08-25 DIAGNOSIS — J029 Acute pharyngitis, unspecified: Secondary | ICD-10-CM | POA: Diagnosis not present

## 2021-08-25 LAB — POCT RAPID STREP A (OFFICE): Rapid Strep A Screen: NEGATIVE

## 2021-08-25 MED ORDER — METHYLPREDNISOLONE ACETATE 80 MG/ML IJ SUSP
80.0000 mg | Freq: Once | INTRAMUSCULAR | Status: AC
  Administered 2021-08-25: 80 mg via INTRAMUSCULAR

## 2021-08-25 NOTE — Progress Notes (Signed)
Subjective:    Patient ID: Brandon Myers, male    DOB: Jul 11, 1966, 56 y.o.   MRN: [id number]  HPI  Patient presents the clinic today with complaint of nasal congestion, ear fullness, sore throat and cough.  He reports this started 5 days ago.  He is blowing mucus out of his nose but is unsure of the color.  He denies ear pain or loss of hearing.  He is not having any difficulty swallowing.  The cough is productive of brown mucous.  He denies headache, runny nose, shortness of breath, chest pain, nausea, vomiting or diarrhea.  He denies fever or chills but has had body aches.  He has tried Dayquil OTC with minimal relief of symptoms.  He has had sick contacts with similar symptoms.  He has not tested for COVID.  He has not had his COVID-vaccine.  Review of Systems     Past Medical History:  Diagnosis Date   Allergy    Anxiety 01/21/2020   Controlled substance agreement signed 04/02/2017   GERD (gastroesophageal reflux disease)    Hypertension    Neutropenia, congenital (HCC)    Phantom limb pain (HCC)     Current Outpatient Medications  Medication Sig Dispense Refill   acetaminophen (TYLENOL) 650 MG CR tablet Take 1,300 mg by mouth 2 (two) times daily as needed for pain.     albuterol (VENTOLIN HFA) 108 (90 Base) MCG/ACT inhaler INHALE 2 PUFFS BY MOUTH FOUR TIMES DAILY AS NEEDED 18 g 12   filgrastim-sndz (ZARXIO) 480 MCG/0.8ML SOSY injection Inject 0.8 mLs into the muscle every other day.     gabapentin (NEURONTIN) 300 MG capsule Take 1 capsule (300 mg total) by mouth daily. 90 capsule 0   gabapentin (NEURONTIN) 800 MG tablet Take 1 tablet (800 mg total) by mouth 3 (three) times daily. 270 tablet 0   loratadine (CLARITIN) 10 MG tablet TAKE 1 TABLET(10 MG) BY MOUTH DAILY (Patient taking differently: daily as needed.) 90 tablet 3   losartan (COZAAR) 100 MG tablet Take 1 tablet (100 mg total) by mouth daily. 90 tablet 1   meloxicam (MOBIC) 7.5 MG tablet Take 1 tablet (7.5 mg total) by mouth  daily. (Patient taking differently: Take 7.5 mg by mouth daily as needed.) 90 tablet 1   metoprolol succinate (TOPROL-XL) 100 MG 24 hr tablet TAKE 1 TABLET BY MOUTH EVERY DAY WITH OR IMMEDIATELY FOLLOWING A MEAL 90 tablet 0   nortriptyline (PAMELOR) 25 MG capsule TAKE 1 CAPSULE(25 MG) BY MOUTH AT BEDTIME 90 capsule 0   omeprazole (PRILOSEC) 40 MG capsule TAKE 1 CAPSULE(40 MG) BY MOUTH DAILY 90 capsule 0   tiZANidine (ZANAFLEX) 4 MG tablet Take 1 tablet (4 mg total) by mouth at bedtime as needed for muscle spasms. 90 tablet 0   No current facility-administered medications for this visit.    Allergies  Allergen Reactions   Enalapril Cough    ACEi Cough   Hydrochlorothiazide     Sweats, hot flashes   Prochlorperazine     Other reaction(s): Other (See Comments) As a child unable to recall   Sulfa Antibiotics Other (See Comments)    Other reaction(s): Other (See Comments) As a child unable to recall   Benadryl [Diphenhydramine]     Increased phantom pain   Strawberry Extract Rash    Family History  Problem Relation Age of Onset   Heart Problems Mother    Asthma Mother    Depression Mother    Thyroid disease Mother  Alzheimer's disease Mother    Hypertension Mother    Stroke Maternal Grandmother    Cancer Maternal Grandmother    Cancer Father        skin   Hypertension Father    Cancer Paternal Grandmother    Cancer Paternal Grandfather     Social History   Socioeconomic History   Marital status: Married    Spouse name: Not on file   Number of children: Not on file   Years of education: Not on file   Highest education level: Associate degree: academic program  Occupational History   Not on file  Tobacco Use   Smoking status: Never   Smokeless tobacco: Never  Vaping Use   Vaping Use: Never used  Substance and Sexual Activity   Alcohol use: Never    Alcohol/week: 0.0 standard drinks   Drug use: Never   Sexual activity: Never  Other Topics Concern   Not on  file  Social History Narrative   Not on file   Social Determinants of Health   Financial Resource Strain: Not on file  Food Insecurity: Not on file  Transportation Needs: Not on file  Physical Activity: Not on file  Stress: Not on file  Social Connections: Not on file  Intimate Partner Violence: Not on file     Constitutional: Denies fever, malaise, fatigue, headache or abrupt weight changes.  HEENT: Patient reports nasal congestion, sore throat.  Denies eye pain, eye redness, ear pain, ringing in the ears, wax buildup, runny nose, bloody nose. Respiratory: Patient reports cough.  Denies difficulty breathing, shortness of breath.   Cardiovascular: Denies chest pain, chest tightness, palpitations or swelling in the hands or feet.  Gastrointestinal: Denies abdominal pain, bloating, constipation, diarrhea or blood in the stool.  Musculoskeletal: Patient reports body aches.  Denies decrease in range of motion, difficulty with gait, or joint pain and swelling.  Skin: Denies redness, rashes, lesions or ulcercations.    No other specific complaints in a complete review of systems (except as listed in HPI above).  Objective:   Physical Exam  BP (!) 138/92 (BP Location: Left Arm, Patient Position: Sitting, Cuff Size: Large)    Pulse 76    Temp 98.9 F (37.2 C) (Oral)    Ht 5\' 8"  (1.727 m)    Wt 206 lb (93.4 kg)    SpO2 96%    BMI 31.32 kg/m   Wt Readings from Last 3 Encounters:  08/12/21 202 lb 12.8 oz (92 kg)  04/26/21 205 lb 9.6 oz (93.3 kg)  03/25/21 201 lb (91.2 kg)    General: Appears his stated age, well nourished in NAD. Skin: Warm, dry and intact. No rashes noted. HEENT: Head: normal shape and size; Eyes: sclera white and EOMs intact; Throat/Mouth: Teeth present, mucosa erythematous and moist, no exudate, lesions or ulcerations noted.  Neck: No adenopathy noted. Cardiovascular: Normal rate and rhythm. S1,S2 noted.  No murmur, rubs or gallops noted.  Pulmonary/Chest: Normal  effort and positive vesicular breath sounds. No respiratory distress. No wheezes, rales or ronchi noted.  Neurological: Alert and oriented.    BMET    Component Value Date/Time   NA 138 08/12/2021 1450   NA 139 12/23/2019 1447   K 4.5 08/12/2021 1450   CL 102 08/12/2021 1450   CO2 30 08/12/2021 1450   GLUCOSE 116 08/12/2021 1450   BUN 13 08/12/2021 1450   BUN 13 12/23/2019 1447   CREATININE 0.76 08/12/2021 1450   CALCIUM 9.3 08/12/2021 1450  GFRNONAA 100 12/23/2019 1447   GFRNONAA >89 12/09/2016 0934   GFRAA 116 12/23/2019 1447   GFRAA >89 12/09/2016 0934    Lipid Panel     Component Value Date/Time   CHOL 117 08/12/2021 1450   CHOL 139 12/23/2019 1447   TRIG 336 (H) 08/12/2021 1450   HDL 17 (L) 08/12/2021 1450   HDL 23 (L) 12/23/2019 1447   CHOLHDL 6.9 (H) 08/12/2021 1450   VLDL 29 12/09/2016 0934   LDLCALC 60 08/12/2021 1450    CBC    Component Value Date/Time   WBC 6.5 08/12/2021 1450   RBC 4.48 08/12/2021 1450   HGB 10.9 (L) 08/12/2021 1450   HGB 14.1 11/27/2018 1344   HCT 35.3 (L) 08/12/2021 1450   HCT 41.8 11/27/2018 1344   PLT 269 08/12/2021 1450   PLT 200 11/27/2018 1344   MCV 78.8 (L) 08/12/2021 1450   MCV 83 11/27/2018 1344   MCH 24.3 (L) 08/12/2021 1450   MCHC 30.9 (L) 08/12/2021 1450   RDW 13.8 08/12/2021 1450   RDW 13.1 11/27/2018 1344   LYMPHSABS 1.9 11/27/2018 1344   MONOABS 1.4 (H) 10/26/2018 2318   EOSABS 0.5 (H) 11/27/2018 1344   BASOSABS 0.2 11/27/2018 1344    Hgb A1C Lab Results  Component Value Date   HGBA1C 5.5 08/12/2021           Assessment & Plan:   Nasal Congestion, Ear Fullness, Cough and Body Aches:  Likely viral given that other members in his household have had similar symptoms Rapid strep negative Will test for COVID Advised him to start Zyrtec and Flonase OTC 80 mg Depo-Medrol IM today for symptom management  She will follow-up after labs with further recommendation and treatment plan

## 2021-08-25 NOTE — Patient Instructions (Signed)
Viral Illness, Adult ?Viruses are tiny germs that can get into a person's body and cause illness. There are many different types of viruses, and they cause many types of illness. Viral illnesses can range from mild to severe. They can affect various parts of the body. ?Short-term conditions that are caused by a virus include colds and the flu (influenza). Long-term conditions that are caused by a virus include herpes, shingles, and HIV (human immunodeficiency virus) infection. A few viruses have been linked to certain cancers. ?What are the causes? ?Many types of viruses can cause illness. Viruses invade cells in your body, multiply, and cause the infected cells to work abnormally or die. When these cells die, they release more of the virus. When this happens, you develop symptoms of the illness, and the virus continues to spread to other cells. If the virus takes over the function of the cell, it can cause the cell to divide and grow out of control. This happens when a virus causes cancer. ?Different viruses get into the body in different ways. You can get a virus by: ?Swallowing food or water that has come in contact with the virus (is contaminated). ?Breathing in droplets that have been coughed or sneezed into the air by an infected person. ?Touching a surface that has been contaminated with the virus and then touching your eyes, nose, or mouth. ?Being bitten by an insect or animal that carries the virus. ?Having sexual contact with a person who is infected with the virus. ?Being exposed to blood or fluids that contain the virus, either through an open cut or during a transfusion. ?If a virus enters your body, your body's defense system (immune system) will try to fight the virus. You may be at higher risk for a viral illness if your immune system is weak. ?What are the signs or symptoms? ?You may have these symptoms, depending on the type of virus and the location of the cells that it invades: ?Cold and flu  viruses: ?Fever. ?Headache. ?Sore throat. ?Muscle aches. ?Stuffy nose (nasal congestion). ?Cough. ?Digestive system (gastrointestinal) viruses: ?Fever. ?Pain in the abdomen. ?Nausea. ?Diarrhea. ?Liver viruses (hepatitis): ?Loss of appetite. ?Tiredness. ?Skin or the white parts of your eyes turning yellow (jaundice). ?Brain and spinal cord viruses: ?Fever. ?Headache. ?Stiff neck. ?Nausea and vomiting. ?Confusion or sleepiness. ?Skin viruses: ?Warts. ?Itching. ?Rash. ?Sexually transmitted viruses: ?Discharge. ?Swelling. ?Redness. ?Rash. ?How is this diagnosed? ?This condition may be diagnosed based on one or more of the following: ?Symptoms. ?Medical history. ?Physical exam. ?Blood test, sample of mucus from your lungs (sputum sample), stool sample, or a swab of body fluids or a skin sore (lesion). ?How is this treated? ?Viruses can be hard to treat because they live within cells. Antibiotic medicines do not treat viruses because these medicines do not get inside cells. Treatment for a viral illness may include: ?Resting and drinking plenty of fluids. ?Medicines to relieve symptoms. These can include over-the-counter medicine for pain and fever, medicines for cough or congestion, and medicines to relieve diarrhea. ?Antiviral medicines. These medicines are available only for certain types of viruses. ?Some viral illnesses can be prevented with vaccinations. A common example is the flu shot. ?Follow these instructions at home: ?Medicines ?Take over-the-counter and prescription medicines only as told by your health care provider. ?If you were prescribed an antiviral medicine, take it as told by your health care provider. Do not stop taking the antiviral even if you start to feel better. ?Be aware of when antibiotics are   needed and when they are not needed. Antibiotics do not treat viruses. You may get an antibiotic if your health care provider thinks that you may have, or are at risk for, a bacterial infection and you  have a viral infection. ?Do not ask for an antibiotic prescription if you have been diagnosed with a viral illness. Antibiotics will not make your illness go away faster. ?Frequently taking antibiotics when they are not needed can lead to antibiotic resistance. When this develops, the medicine no longer works against the bacteria that it normally fights. ?General instructions ? ?Drink enough fluids to keep your urine pale yellow. ?Rest as much as possible. ?Return to your normal activities as told by your health care provider. Ask your health care provider what activities are safe for you. ?Keep all follow-up visits as told by your health care provider. This is important. ?How is this prevented? ?To reduce your risk of viral illness: ?Wash your hands often with soap and water for at least 20 seconds. If soap and water are not available, use hand sanitizer. ?Avoid touching your nose, eyes, and mouth, especially if you have not washed your hands recently. ?If anyone in your household has a viral infection, clean all household surfaces that may have been in contact with the virus. Use soap and hot water. You may also use bleach that you have added water to (diluted). ?Stay away from people who are sick with symptoms of a viral infection. ?Do not share items such as toothbrushes and water bottles with other people. ?Keep your vaccinations up to date. This includes getting a yearly flu shot. ?Eat a healthy diet and get plenty of rest. ?Contact a health care provider if: ?You have symptoms of a viral illness that do not go away. ?Your symptoms come back after going away. ?Your symptoms get worse. ?Get help right away if you have: ?Trouble breathing. ?A severe headache or a stiff neck. ?Severe vomiting or pain in your abdomen. ?These symptoms may represent a serious problem that is an emergency. Do not wait to see if the symptoms will go away. Get medical help right away. Call your local emergency services (911 in the  [place] Do not drive yourself to the hospital. ?Summary ?Viruses are types of germs that can get into a person's body and cause illness. Viral illnesses can range from mild to severe. They can affect various parts of the body. ?Viruses can be hard to treat. There are medicines to relieve symptoms, and there are some antiviral medicines. ?If you were prescribed an antiviral medicine, take it as told by your health care provider. Do not stop taking the antiviral even if you start to feel better. ?Contact a health care provider if you have symptoms of a viral illness that do not go away. ?This information is not intended to replace advice given to you by your health care provider. Make sure you discuss any questions you have with your health care provider. ?Document Revised: 12/02/2019 Document Reviewed: 05/28/2019 ?Elsevier Patient Education ? 2022 [employer] ? ?

## 2021-08-26 LAB — NOVEL CORONAVIRUS, NAA: SARS-CoV-2, NAA: NOT DETECTED

## 2021-08-26 LAB — SPECIMEN STATUS REPORT

## 2021-08-26 LAB — SARS-COV-2, NAA 2 DAY TAT

## 2021-08-29 ENCOUNTER — Encounter: Payer: Self-pay | Admitting: Internal Medicine

## 2021-08-30 ENCOUNTER — Encounter: Payer: Self-pay | Admitting: Family Medicine

## 2021-08-30 ENCOUNTER — Ambulatory Visit: Payer: Self-pay

## 2021-08-30 ENCOUNTER — Telehealth (INDEPENDENT_AMBULATORY_CARE_PROVIDER_SITE_OTHER): Payer: Medicare Other | Admitting: Family Medicine

## 2021-08-30 DIAGNOSIS — J0110 Acute frontal sinusitis, unspecified: Secondary | ICD-10-CM | POA: Diagnosis not present

## 2021-08-30 MED ORDER — AMOXICILLIN-POT CLAVULANATE 875-125 MG PO TABS: 1.0000 | ORAL_TABLET | Freq: Two times a day (BID) | ORAL | 0 refills | Status: DC

## 2021-08-30 NOTE — Telephone Encounter (Signed)
°  Chief Complaint: sinus infection Symptoms: sinus pressure, cough, congestion, nasal discharge, ear ache, body aches Frequency: 10 days Pertinent Negatives: Patient denies fever Disposition: [] ED /[] Urgent Care (no appt availability in office) / [x] Appointment(In office/virtual)/ []  [facility]/ [] Home Care/ [] Refused Recommended Disposition /[]  Mobile Bus/ []  Follow-up with PCP Additional Notes:  pt was seen on 08/25/21 and advised to try OTC meds and call back if symptoms not improved. Virtual visit scheduled for today.    Summary: advice - antibotic   Pts wife called stating pt needed to be seen to get an antibiotic, pt is having sinus issues and next open appt is 2/2, pt needed advice.      Reason for Disposition  [1] Sinus congestion (pressure, fullness) AND [2] present > 10 days  Answer Assessment - Initial Assessment Questions 1. LOCATION: "Where does it hurt?"      Face and under eyes 2. ONSET: "When did the sinus pain start?"  (e.g., hours, days)      Last Wednesday 3. SEVERITY: "How bad is the pain?"   (Scale 1-10; mild, moderate or severe)   - MILD (1-3): doesn't interfere with normal activities    - MODERATE (4-7): interferes with normal activities (e.g., work or school) or awakens from sleep   - SEVERE (8-10): excruciating pain and patient unable to do any normal activities        moderate 4. RECURRENT SYMPTOM: "Have you ever had sinus problems before?" If Yes, ask: "When was the last time?" and "What happened that time?"      Yes last year, abx was given 5. NASAL CONGESTION: "Is the nose blocked?" If Yes, ask: "Can you open it or must you breathe through your mouth?"     Yes  6. NASAL DISCHARGE: "Do you have discharge from your nose?" If so ask, "What color?"     Yellow green 7. FEVER: "Do you have a fever?" If Yes, ask: "What is it, how was it measured, and when did it start?"      No 8. OTHER SYMPTOMS: "Do you have any other symptoms?" (e.g.,  sore throat, cough, earache, difficulty breathing)     Cough, sore throat, hoarseness  Protocols used: Sinus Pain or Congestion-A-AH

## 2021-08-30 NOTE — Patient Instructions (Addendum)
1. It sounds like you have a Sinusitis (Bacterial Infection) - this most likely started as an Upper Respiratory Virus that has settled into an infection. Allergies can also cause this. - Start Augmentin 1 pill twice daily (breakfast and dinner, with food and plenty of water) for 10 days, complete entire course, do not stop early even if feeling better - Resume Loratadine (Claritin) 10mg  daily and Flonase 2 sprays in each nostril daily for next 4-6 weeks, then you may stop and use seasonally or as needed - Recommend to keep using Nasal Saline spray multiple times a day to help flush out congestion and clear sinuses - Improve hydration by drinking plenty of clear fluids (water, gatorade) to reduce secretions and thin congestion - Congestion draining down throat can cause irritation. May try warm herbal tea with honey, cough drops - Can take Tylenol or Ibuprofen as needed for fevers - May continue over the counter cold medicine as you are, I would not use any decongestant or mucinex longer than 7 days.  If you develop persistent fever >101F for at least 3 consecutive days, headaches with sinus pain or pressure or persistent earache, please schedule a follow-up evaluation within next few days to week.   Please schedule a Follow-up Appointment to: Return if symptoms worsen or fail to improve.  If you have any other questions or concerns, please feel free to call the office or send a message through [place]. You may also schedule an earlier appointment if necessary.  Additionally, you may be receiving a survey about your experience at our office within a few days to 1 week by e-mail or mail. We value your feedback.  Nobie Putnam, DO [place]

## 2021-08-30 NOTE — Progress Notes (Signed)
Subjective:    Patient ID: Brandon Myers, male    DOB: 12-13-1965, 56 y.o.   MRN: [id number]  Brandon Myers is a 56 y.o. male presenting on 08/30/2021 for Cough (Chest congestion with tightness, coughing up brownish phlegm, yellowish mucus from the nasal cavity and hoariness x 11 days. (-) negative COVID test on 08/25/2021  )  Virtual / Telehealth Encounter - Video Visit via MyChart The purpose of this virtual visit is to provide medical care while limiting exposure to the novel coronavirus (COVID19) for both patient and office staff.  Consent was obtained for remote visit:  Yes.   Answered questions that patient had about telehealth interaction:  Yes.   I discussed the limitations, risks, security and privacy concerns of performing an evaluation and management service by video/telephone. I also discussed with the patient that there may be a patient responsible charge related to this service. The patient expressed understanding and agreed to proceed.  Patient Location: Home Provider Location: [facility] (Office)  Participants in virtual visit: - Patient: Brandon Myers - CMA: Burnell Blanks, CMA - Provider: Dr Althea Charon   HPI  Sinusitis Congestion Laryngitis Last visit with PCP Nicki Reaper, FNP seen on 08/25/21 with sinus and URI symptoms, he had been tested for rapid strep and covid injection at that time. He, treated with steroid injection at that time without much relief.  Now persistent problem congestion drainage in back of throat, now has thicker productive yellow sputum. Still with productive phlegm.  He has history of neutropenia and usually responds to Augmentin for sinusitis Has Flonase at home already  Denies fever chills sweats nausea vomiting headache  Depression screen [facility] 2/9 08/25/2021 08/12/2021 03/25/2021  Decreased Interest 0 0 0  Down, Depressed, Hopeless 0 0 0  PHQ - 2 Score 0 0 0  Altered sleeping 0 0 -  Tired, decreased energy 0 0 -   Change in appetite 0 0 -  Feeling bad or failure about yourself  0 0 -  Trouble concentrating 0 0 -  Moving slowly or fidgety/restless 0 0 -  Suicidal thoughts 0 0 -  PHQ-9 Score 0 0 -  Difficult doing work/chores Not difficult at all Not difficult at all -  Some recent data might be hidden    Social History   Tobacco Use   Smoking status: Never   Smokeless tobacco: Never  Vaping Use   Vaping Use: Never used  Substance Use Topics   Alcohol use: Never    Alcohol/week: 0.0 standard drinks   Drug use: Never    Review of Systems Per HPI unless specifically indicated above     Objective:    There were no vitals taken for this visit.  Wt Readings from Last 3 Encounters:  08/25/21 206 lb (93.4 kg)  08/12/21 202 lb 12.8 oz (92 kg)  04/26/21 205 lb 9.6 oz (93.3 kg)    Physical Exam  Note examination was completely remotely via video observation objective data only  Gen - well-appearing, no acute distress or apparent pain, comfortable HEENT - eyes appear clear without discharge or redness Heart/Lungs - cannot examine virtually - observed no evidence of coughing or labored breathing. Abd - cannot examine virtually  Skin - face visible today- no rash Neuro - awake, alert, oriented Psych - not anxious appearing   Results for orders placed or performed in visit on 08/25/21  Novel Coronavirus, NAA ([facility])   Specimen: Nasopharyngeal(NP) swabs in vial transport  medium   Nasopharynge  Previous  Result Value Ref Range   SARS-CoV-2, NAA Not Detected Not Detected  SARS-COV-2, NAA 2 DAY TAT   Nasopharynge  Previous  Result Value Ref Range   SARS-CoV-2, NAA 2 DAY TAT Performed   Specimen status report  Result Value Ref Range   specimen status report Comment   POCT rapid strep A  Result Value Ref Range   Rapid Strep A Screen Negative Negative      Assessment & Plan:   Problem List Items Addressed This Visit   None Visit Diagnoses     Acute non-recurrent frontal  sinusitis    -  Primary   Relevant Medications   amoxicillin-clavulanate (AUGMENTIN) 875-125 MG tablet       Consistent with acute frontal sinusitis, likely initially viral URI vs allergic rhinitis component with worsening concern for bacterial infection.   Already had rapid strep / COVID NEGATIVE 08/25/21.  Plan: 1.  Start Augmentin 875-125mg  PO BID x 10 days 2.  Use Flonase as directed, OTC meds decongestant PRN Return criteria reviewed    Meds ordered this encounter  Medications   amoxicillin-clavulanate (AUGMENTIN) 875-125 MG tablet    Sig: Take 1 tablet by mouth 2 (two) times daily.    Dispense:  20 tablet    Refill:  0      Follow up plan: Return if symptoms worsen or fail to improve.   Patient verbalizes understanding with the above medical recommendations including the limitation of remote medical advice.  Specific follow-up and call-back criteria were given for patient to follow-up or seek medical care more urgently if needed.  Total duration of direct patient care provided via video conference: 7 minutes   Saralyn Pilar, DO [facility] 08/30/2021, 4:46 PM

## 2021-09-02 ENCOUNTER — Telehealth: Payer: Self-pay

## 2021-09-02 NOTE — Telephone Encounter (Signed)
I attempted to contact the patient to get him scheduled for a medicare wellness, no answer. LMOM to return my call.

## 2021-10-11 ENCOUNTER — Other Ambulatory Visit: Payer: Self-pay

## 2021-10-11 ENCOUNTER — Encounter: Payer: Self-pay | Admitting: Internal Medicine

## 2021-10-11 ENCOUNTER — Ambulatory Visit (INDEPENDENT_AMBULATORY_CARE_PROVIDER_SITE_OTHER): Payer: Medicare Other | Admitting: Internal Medicine

## 2021-10-11 VITALS — BP 135/81 | HR 71 | Temp 96.9°F | Wt 208.0 lb

## 2021-10-11 DIAGNOSIS — J00 Acute nasopharyngitis [common cold]: Secondary | ICD-10-CM

## 2021-10-11 MED ORDER — PROMETHAZINE-DM 6.25-15 MG/5ML PO SYRP: 5.0000 mL | ORAL_SOLUTION | Freq: Four times a day (QID) | ORAL | 0 refills | Status: DC | PRN

## 2021-10-11 MED ORDER — AZITHROMYCIN 250 MG PO TABS: ORAL_TABLET | ORAL | 0 refills | Status: DC

## 2021-10-11 NOTE — Patient Instructions (Signed)

## 2021-10-11 NOTE — Progress Notes (Signed)
HPI ? ?Pt presents to the clinic today with c/o runny nose, hoarseness and cough.  He reports this started 10 days ago.  He is blowing clear/yellow mucus out of his nose.  The cough is productive of brown mucus.  He denies headache, nasal congestion, ear pain, sore throat, shortness of breath, chest pain, nausea, vomiting or diarrhea.  He denies fever, chills or body aches.  He has taken Tylenol and NyQuil OTC with minimal relief of symptoms.  He has had sick contacts with similar symptoms.  He does not smoke. ? ?Review of Systems ? ? ?   ?Past Medical History:  ?Diagnosis Date  ? Allergy   ? Anxiety 01/21/2020  ? Controlled substance agreement signed 04/02/2017  ? GERD (gastroesophageal reflux disease)   ? Hypertension   ? Neutropenia, congenital (HCC)   ? Phantom limb pain (HCC)   ? ? ?Family History  ?Problem Relation Age of Onset  ? Heart Problems Mother   ? Asthma Mother   ? Depression Mother   ? Thyroid disease Mother   ? Alzheimer's disease Mother   ? Hypertension Mother   ? Stroke Maternal Grandmother   ? Cancer Maternal Grandmother   ? Cancer Father   ?     skin  ? Hypertension Father   ? Cancer Paternal Grandmother   ? Cancer Paternal Grandfather   ? ? ?Social History  ? ?Socioeconomic History  ? Marital status: Married  ?  Spouse name: Not on file  ? Number of children: Not on file  ? Years of education: Not on file  ? Highest education level: Associate degree: academic program  ?Occupational History  ? Not on file  ?Tobacco Use  ? Smoking status: Never  ? Smokeless tobacco: Never  ?Vaping Use  ? Vaping Use: Never used  ?Substance and Sexual Activity  ? Alcohol use: Never  ?  Alcohol/week: 0.0 standard drinks  ? Drug use: Never  ? Sexual activity: Never  ?Other Topics Concern  ? Not on file  ?Social History Narrative  ? Not on file  ? ?Social Determinants of Health  ? ?Financial Resource Strain: Not on file  ?Food Insecurity: Not on file  ?Transportation Needs: Not on file  ?Physical Activity: Not on file   ?Stress: Not on file  ?Social Connections: Not on file  ?Intimate Partner Violence: Not on file  ? ? ?Allergies  ?Allergen Reactions  ? Enalapril Cough  ?  ACEi Cough  ? Hydrochlorothiazide   ?  Sweats, hot flashes  ? Prochlorperazine   ?  Other reaction(s): Other (See Comments) ?As a child unable to recall  ? Sulfa Antibiotics Other (See Comments)  ?  Other reaction(s): Other (See Comments) ?As a child unable to recall  ? Benadryl [Diphenhydramine]   ?  Increased phantom pain  ? Strawberry Extract Rash  ? ? ? ?Constitutional: Denies headache, fatigue, fever or abrupt weight changes.  ?HEENT:  Positive runny nose denies eye redness, eye pain, pressure behind the eyes, facial pain, nasal congestion, ear pain, ringing in the ears, wax buildup or sore throat. ?Respiratory: Positive cough. Denies difficulty breathing or shortness of breath.  ?Cardiovascular: Denies chest pain, chest tightness, palpitations or swelling in the hands or feet.  ? ?No other specific complaints in a complete review of systems (except as listed in HPI above). ? ?Objective:  ? ?BP 135/81 (BP Location: Left Arm, Patient Position: Sitting, Cuff Size: Large)   Pulse 71   Temp (!) 96.9 ?F (  36.1 ?C) (Temporal)   Wt 208 lb (94.3 kg)   SpO2 100%   BMI 31.63 kg/m?  ? ?Wt Readings from Last 3 Encounters:  ?08/25/21 206 lb (93.4 kg)  ?08/12/21 202 lb 12.8 oz (92 kg)  ?04/26/21 205 lb 9.6 oz (93.3 kg)  ? ? ? ?General: Appears his stated age, obese, in NAD. ?HEENT: Head: normal shape and size; Eyes: sclera white, no icterus, conjunctiva pink;  ?Neck: No cervical lymphadenopathy.  ?Cardiovascular: Normal rate and rhythm. S1,S2 noted.  No murmur, rubs or gallops noted.  ?Pulmonary/Chest: Normal effort and positive vesicular breath sounds. No respiratory distress. No wheezes, rales or ronchi noted.  ? ?    ?Assessment & Plan:  ? ?Upper Respiratory Infection: ? ?Get some rest and drink plenty of water ?Start Claritin OTC daily x2 weeks ?eRx for  Azithromax x 5 days ?eRx for Promethazine DM cough syrup ? ?RTC in 4 months for follow-up of chronic conditions ? ? ?Nicki Reaper, NP ?This visit occurred during the SARS-CoV-2 public health emergency.  Safety protocols were in place, including screening questions prior to the visit, additional usage of staff PPE, and extensive cleaning of exam room while observing appropriate contact time as indicated for disinfecting solutions.  ? ?

## 2021-10-12 ENCOUNTER — Other Ambulatory Visit: Payer: Self-pay

## 2021-10-12 MED ORDER — PROMETHAZINE-DM 6.25-15 MG/5ML PO SYRP: 5.0000 mL | ORAL_SOLUTION | Freq: Four times a day (QID) | ORAL | 0 refills | Status: DC | PRN

## 2021-10-12 NOTE — Telephone Encounter (Signed)
Pt states the pharmacy did not get promethazine-dextromethorphan.  Please resend.  ? ?Thanks,  ? ?-Vernona Rieger  ?

## 2021-10-12 NOTE — Telephone Encounter (Signed)
[facility] has this RX but they have it on hold due to the pt's allergy to prochlorperazine.  She said if your okay with him taking this to resent the RX with a note in the comment section that it is okay to fill. ? ?Thanks,  ? ?-Mickel Baas  ? ?

## 2021-10-12 NOTE — Telephone Encounter (Signed)
Can you check on this? Pharmacy confirmed receipt of this medication ?

## 2021-10-20 NOTE — Progress Notes (Signed)
I connected with Brandon Myers today by telephone and verified that I am speaking with the correct person using two identifiers. ?Location patient: home ?Location provider: work ?Persons participating in the virtual visit:  Brandon, Myers, CMA ?  ?I discussed the limitations, risks, security and privacy concerns of performing an evaluation and management service by telephone and the availability of in person appointments. I also discussed with the patient that there may be a patient responsible charge related to this service. The patient expressed understanding and verbally consented to this telephonic visit.  ?  ?Interactive audio and video telecommunications were attempted between this provider and patient, however failed, due to patient having technical difficulties OR patient did not have access to video capability.  We continued and completed visit with audio only.   ? ? ? ? ? ? ? ? ?Subjective:  ? Brandon Myers is a 56 y.o. male who presents for Medicare Annual/Subsequent preventive examination. ? ?Review of Systems    ?  ?   ?Objective:  ?  ?There were no vitals filed for this visit. ?There is no height or weight on file to calculate BMI. ? ? ?  03/25/2021  ? 12:57 PM 12/08/2020  ?  1:07 PM 06/11/2020  ?  1:05 PM 03/19/2020  ?  1:41 PM 11/11/2019  ?  3:18 PM 10/26/2018  ? 11:52 PM 10/09/2018  ?  1:38 PM  ?Advanced Directives  ?Does Patient Have a Medical Advance Directive? No No No No No No Yes  ?Type of Advance Directive      Living will Living will  ?Would patient like information on creating a medical advance directive?  No - Patient declined No - Patient declined   No - Patient declined   ? ? ?Current Medications (verified) ?Outpatient Encounter Medications as of 10/21/2021  ?Medication Sig  ? acetaminophen (TYLENOL) 650 MG CR tablet Take 1,300 mg by mouth 2 (two) times daily as needed for pain.  ? albuterol (VENTOLIN HFA) 108 (90 Base) MCG/ACT inhaler INHALE 2 PUFFS BY MOUTH FOUR TIMES DAILY AS  NEEDED  ? filgrastim-sndz (ZARXIO) 480 MCG/0.8ML SOSY injection Inject 0.8 mLs into the muscle every other day.  ? gabapentin (NEURONTIN) 300 MG capsule Take 1 capsule (300 mg total) by mouth daily.  ? gabapentin (NEURONTIN) 800 MG tablet Take 1 tablet (800 mg total) by mouth 3 (three) times daily.  ? loratadine (CLARITIN) 10 MG tablet TAKE 1 TABLET(10 MG) BY MOUTH DAILY (Patient taking differently: daily as needed.)  ? losartan (COZAAR) 100 MG tablet Take 1 tablet (100 mg total) by mouth daily.  ? meloxicam (MOBIC) 7.5 MG tablet Take 1 tablet (7.5 mg total) by mouth daily. (Patient taking differently: Take 7.5 mg by mouth daily as needed.)  ? metoprolol succinate (TOPROL-XL) 100 MG 24 hr tablet TAKE 1 TABLET BY MOUTH EVERY DAY WITH OR IMMEDIATELY FOLLOWING A MEAL  ? nortriptyline (PAMELOR) 25 MG capsule TAKE 1 CAPSULE(25 MG) BY MOUTH AT BEDTIME  ? omeprazole (PRILOSEC) 40 MG capsule TAKE 1 CAPSULE(40 MG) BY MOUTH DAILY  ? tiZANidine (ZANAFLEX) 4 MG tablet Take 1 tablet (4 mg total) by mouth at bedtime as needed for muscle spasms.  ? [DISCONTINUED] azithromycin (ZITHROMAX) 250 MG tablet Take 2 tabs today, then 1 tab daily x 4 days (Patient not taking: Reported on 10/21/2021)  ? [DISCONTINUED] promethazine-dextromethorphan (PROMETHAZINE-DM) 6.25-15 MG/5ML syrup Take 5 mLs by mouth 4 (four) times daily as needed for cough. (Patient not taking: Reported on 10/21/2021)  ? ?  No facility-administered encounter medications on file as of 10/21/2021.  ? ? ?Allergies (verified) ?Enalapril, Hydrochlorothiazide, Prochlorperazine, Sulfa antibiotics, Benadryl [diphenhydramine], and Strawberry extract  ? ?History: ?Past Medical History:  ?Diagnosis Date  ? Allergy   ? Anxiety 01/21/2020  ? Controlled substance agreement signed 04/02/2017  ? GERD (gastroesophageal reflux disease)   ? Hypertension   ? Neutropenia, congenital (HCC)   ? Phantom limb pain (HCC)   ? ?Past Surgical History:  ?Procedure Laterality Date  ? ABOVE ELBOW ARM  AMPUTATION Right 1993  ? AMPUTATION    ? APPENDECTOMY    ? COLON SURGERY  1994  ? LEG AMPUTATION THROUGH KNEE Right 1993  ? ?Family History  ?Problem Relation Age of Onset  ? Heart Problems Mother   ? Asthma Mother   ? Depression Mother   ? Thyroid disease Mother   ? Alzheimer's disease Mother   ? Hypertension Mother   ? Stroke Maternal Grandmother   ? Cancer Maternal Grandmother   ? Cancer Father   ?     skin  ? Hypertension Father   ? Cancer Paternal Grandmother   ? Cancer Paternal Grandfather   ? ?Social History  ? ?Socioeconomic History  ? Marital status: Married  ?  Spouse name: Brandon Myers  ? Number of children: 2  ? Years of education: Not on file  ? Highest education level: Associate degree: academic program  ?Occupational History  ? Occupation: Disabled  ?Tobacco Use  ? Smoking status: Never  ? Smokeless tobacco: Never  ?Vaping Use  ? Vaping Use: Never used  ?Substance and Sexual Activity  ? Alcohol use: Never  ?  Alcohol/week: 0.0 standard drinks  ? Drug use: Never  ? Sexual activity: Never  ?Other Topics Concern  ? Not on file  ?Social History Narrative  ? Not on file  ? ?Social Determinants of Health  ? ?Financial Resource Strain: Not on file  ?Food Insecurity: No Food Insecurity  ? Worried About Programme researcher, broadcasting/film/video in the Last Year: Never true  ? Ran Out of Food in the Last Year: Never true  ?Transportation Needs: No Transportation Needs  ? Lack of Transportation (Medical): No  ? Lack of Transportation (Non-Medical): No  ?Physical Activity: Inactive  ? Days of Exercise per Week: 0 days  ? Minutes of Exercise per Session: 0 min  ?Stress: No Stress Concern Present  ? Feeling of Stress : Not at all  ?Social Connections: Socially Integrated  ? Frequency of Communication with Friends and Family: Once a week  ? Frequency of Social Gatherings with Friends and Family: More than three times a week  ? Attends Religious Services: More than 4 times per year  ? Active Member of Clubs or Organizations: Yes  ? Attends  Banker Meetings: More than 4 times per year  ? Marital Status: Married  ? ? ?Tobacco Counseling ?Counseling given: Not Answered ? ? ?Clinical Intake: ? ?  ? ?  ? ?  ? ? ? ?How often do you need to have someone help you when you read instructions, pamphlets, or other written materials from your doctor or pharmacy?: (P) 1 - Never ? ?Diabetic?No ? ?  ? ?  ? ? ?Activities of Daily Living ? ?  10/21/2021  ?  3:56 PM 10/21/2021  ?  9:32 AM  ?In your present state of health, do you have any difficulty performing the following activities:  ?Hearing? 0 0  ?Vision? 1 0  ?Difficulty concentrating or  making decisions? 0 0  ?Walking or climbing stairs? 1 1  ?Dressing or bathing? 0 0  ?Doing errands, shopping? 0 0  ?Preparing Food and eating ?  N  ?Using the Toilet?  N  ?In the past six months, have you accidently leaked urine?  N  ?Do you have problems with loss of bowel control?  N  ?Managing your Medications?  N  ?Managing your Finances?  N  ?Housekeeping or managing your Housekeeping?  N  ? ? ?Patient Care Team: ?Lorre MunroeBaity, Regina W, NP as PCP - General (Internal Medicine) ?Edward JollyLateef, Bilal, MD as Consulting Physician (Pain Medicine) ?Wallace KellerWehbie, Robert S (Inactive) as Referring Physician ? ?No hospitalization in the past 12 months.    ?Assessment:  ? This is a routine wellness examination for Caryn BeeKevin. ? ?Hearing/Vision screen ?No results found. ? ?Dietary issues and exercise activities discussed: ? The pt eats a well balance diet 3 meals a day with meat, vegetables and fruit.  ? ? Goals Addressed   ?None ?  ? ?Depression Screen ? ?  10/11/2021  ?  9:50 AM 08/25/2021  ?  9:41 AM 08/12/2021  ?  2:33 PM 03/25/2021  ? 12:56 PM 12/08/2020  ?  1:07 PM 01/20/2020  ?  2:06 PM 11/11/2019  ?  3:23 PM  ?PHQ 2/9 Scores  ?PHQ - 2 Score 0 0 0 0 0 1 0  ?PHQ- 9 Score 0 0 0   2   ?  ?Fall Risk ? ?  10/21/2021  ?  3:56 PM 10/21/2021  ?  9:32 AM 10/11/2021  ?  9:50 AM 08/25/2021  ?  9:41 AM 08/12/2021  ?  2:33 PM  ?Fall Risk   ?Falls in the past year? 1 1  1  0 0  ?Number falls in past yr: 0 1 1 0 0  ?Injury with Fall? 0 0 0 0 0  ?Risk for fall due to : No Fall Risks  History of fall(s) No Fall Risks Impaired mobility  ?Follow up Falls evaluation completed  Falls e

## 2021-10-21 ENCOUNTER — Ambulatory Visit (INDEPENDENT_AMBULATORY_CARE_PROVIDER_SITE_OTHER): Payer: Medicare Other

## 2021-10-21 ENCOUNTER — Other Ambulatory Visit: Payer: Self-pay

## 2021-10-21 DIAGNOSIS — S88919A Complete traumatic amputation of unspecified lower leg, level unspecified, initial encounter: Secondary | ICD-10-CM

## 2021-10-21 DIAGNOSIS — Z0000 Encounter for general adult medical examination without abnormal findings: Secondary | ICD-10-CM

## 2021-10-22 NOTE — Progress Notes (Signed)
Referral placed.

## 2021-10-22 NOTE — Addendum Note (Signed)
Addended by: Jearld Fenton on: 10/22/2021 11:54 AM ? ? Modules accepted: Orders ? ?

## 2021-10-29 ENCOUNTER — Other Ambulatory Visit: Payer: Self-pay | Admitting: Internal Medicine

## 2021-10-29 NOTE — Telephone Encounter (Signed)
Requested Prescriptions  ?Pending Prescriptions Disp Refills  ?? metoprolol succinate (TOPROL-XL) 100 MG 24 hr tablet [Pharmacy Med Name: METOPROLOL ER SUCCINATE 100MG  TABS] 90 tablet 0  ?  Sig: TAKE 1 TABLET BY MOUTH EVERY DAY WITH OR IMMEDIATELY FOLLOWING A MEAL  ?  ? Cardiovascular:  Beta Blockers Passed - 10/29/2021  9:27 AM  ?  ?  Passed - Last BP in normal range  ?  BP Readings from Last 1 Encounters:  ?10/11/21 135/81  ?   ?  ?  Passed - Last Heart Rate in normal range  ?  Pulse Readings from Last 1 Encounters:  ?10/11/21 71  ?   ?  ?  Passed - Valid encounter within last 6 months  ?  Recent Outpatient Visits   ?      ? 2 weeks ago Acute nasopharyngitis  ? [facility] [place], [place], NP  ? 2 months ago Acute non-recurrent frontal sinusitis  ? [place], DO  ? 2 months ago Viral URI with cough  ? [facility] [place], [place]al[place] Keens, NP  ? 2 months ago Encounter for general adult medical examination with abnormal findings  ? [facility] [place], [place] W, NP  ? 6 months ago Chronic midline low back pain, unspecified whether sciatica present  ? [facility] [place], Coralie Keens, NP  ?  ?  ?Future Appointments   ?        ? In 3 months Baity, Coralie Keens, NP [facility], [facility], [facility], [place]  ?  ? ?  ?  ?  ? ? ?

## 2021-11-01 ENCOUNTER — Telehealth: Payer: Self-pay

## 2021-11-01 NOTE — Telephone Encounter (Signed)
? ?  Telephone encounter was:  Unsuccessful.  11/01/2021 ?Name: ZENDE REICHARDT MRN: [id number] DOB: 08-Jul-1966 ? ?Unsuccessful outbound call made today to assist with:  Home Modifications ? ?Outreach Attempt:  1st Attempt ? ?A HIPAA compliant voice message was left requesting a return call.  Instructed patient to call back at [phone] at their earliest convenience. ? ?Called pt's home number and cell number and left a voicemail on both phones instructing pt to call regarding resources. ? ?Cameron Proud ?Care Guide, Embedded Care Coordination ?[facility] Health  Care management  ?[place], [place]  ?Main Phone: [phone]  E-mail: [email or web address]  ?Website: [email or web address] ? ? ? ?

## 2021-11-02 ENCOUNTER — Telehealth: Payer: Self-pay

## 2021-11-02 NOTE — Telephone Encounter (Signed)
? ?  Telephone encounter was:  Unsuccessful.  11/02/2021 ?Name: KYLON PHILBROOK MRN: [id number] DOB: [birth year] ? ?Unsuccessful outbound call made today to assist with:  Home Modifications ? ?Outreach Attempt:  2nd Attempt ? ?A HIPAA compliant voice message was left requesting a return call.  Instructed patient to call back at [phone] at their earliest convenience. ? ?Called pt's home number and cell number and left a voicemail on both phones instructing pt to call regarding resources. ? ?Hessie Knows ?Care Guide, Embedded Care Coordination ?[facility] Health  Care management  ?[place], [place] [address]  ?Main Phone: [phone]  E-mail: [email or web address]  ?Website: [email or web address] ? ? ? ?

## 2021-11-03 ENCOUNTER — Telehealth: Payer: Self-pay

## 2021-11-03 NOTE — Telephone Encounter (Signed)
? ?  Telephone encounter was:  Successful.  ?11/03/2021 ?Name: BLAND RUDZINSKI MRN: [id number] DOB: [birth year] ? ?PARAG DORTON is a 56 y.o. year old male who is a primary care patient of Lorre Munroe, NP . The community resource team was consulted for assistance with Home Modifications ? ?Care guide performed the following interventions: Patient provided with information about care guide support team and interviewed to confirm resource needs. ? ?Follow Up Plan:  Care guide will outreach resources to assist patient with ramps at the home. ? ?Hessie Knows ?Care Guide, Embedded Care Coordination ?[facility] Health  Care management  ?[place], [place] [address]  ?Main Phone: [phone]  E-mail: [email or web address]  ?Website: [email or web address] ? ? ? ?

## 2021-11-04 DIAGNOSIS — M5136 Other intervertebral disc degeneration, lumbar region: Secondary | ICD-10-CM | POA: Diagnosis not present

## 2021-11-04 DIAGNOSIS — M5116 Intervertebral disc disorders with radiculopathy, lumbar region: Secondary | ICD-10-CM | POA: Diagnosis not present

## 2021-11-04 DIAGNOSIS — M9903 Segmental and somatic dysfunction of lumbar region: Secondary | ICD-10-CM | POA: Diagnosis not present

## 2021-11-04 DIAGNOSIS — M5416 Radiculopathy, lumbar region: Secondary | ICD-10-CM | POA: Diagnosis not present

## 2021-11-08 DIAGNOSIS — M5416 Radiculopathy, lumbar region: Secondary | ICD-10-CM | POA: Diagnosis not present

## 2021-11-08 DIAGNOSIS — M5136 Other intervertebral disc degeneration, lumbar region: Secondary | ICD-10-CM | POA: Diagnosis not present

## 2021-11-08 DIAGNOSIS — M5116 Intervertebral disc disorders with radiculopathy, lumbar region: Secondary | ICD-10-CM | POA: Diagnosis not present

## 2021-11-08 DIAGNOSIS — M9903 Segmental and somatic dysfunction of lumbar region: Secondary | ICD-10-CM | POA: Diagnosis not present

## 2021-11-09 ENCOUNTER — Other Ambulatory Visit: Payer: Self-pay | Admitting: Internal Medicine

## 2021-11-09 ENCOUNTER — Other Ambulatory Visit: Payer: Self-pay | Admitting: Family Medicine

## 2021-11-09 DIAGNOSIS — G546 Phantom limb syndrome with pain: Secondary | ICD-10-CM

## 2021-11-09 DIAGNOSIS — K219 Gastro-esophageal reflux disease without esophagitis: Secondary | ICD-10-CM

## 2021-11-10 NOTE — Telephone Encounter (Signed)
Requested Prescriptions  ?Pending Prescriptions Disp Refills  ?? omeprazole (PRILOSEC) 40 MG capsule [Pharmacy Med Name: OMEPRAZOLE 40MG  CAPSULES] 90 capsule 0  ?  Sig: TAKE 1 CAPSULE(40 MG) BY MOUTH DAILY  ?  ? Gastroenterology: Proton Pump Inhibitors Passed - 11/09/2021 12:44 PM  ?  ?  Passed - Valid encounter within last 12 months  ?  Recent Outpatient Visits   ?      ? 1 month ago Acute nasopharyngitis  ? [facility] [place], [place] W, NP  ? 2 months ago Acute non-recurrent frontal sinusitis  ? [place], DO  ? 2 months ago Viral URI with cough  ? [facility] [place], Coralie Keens, NP  ? 3 months ago Encounter for general adult medical examination with abnormal findings  ? [facility] [place], [place] W, NP  ? 6 months ago Chronic midline low back pain, unspecified whether sciatica present  ? [facility] [place], Coralie Keens, NP  ?  ?  ?Future Appointments   ?        ? In 3 months Baity, Coralie Keens, NP [facility] [place]  ?  ? ?  ?  ?  ? ? ?

## 2021-11-10 NOTE — Telephone Encounter (Signed)
Requested medication (s) are due for refill today: it is on list but expired ? ?Requested medication (s) are on the active medication list: on list but expired (Dr. Kirtland Bouchard) 09/29/2020 ? ?Last refill:  08/15/21 ? ?Future visit scheduled: 02/10/22 ? ?Notes to clinic:  On 06/30/21 dose ordered 300mg  daily, on 08/15/21 this rx for 800mg  TID was filled. Please assess.  ? ? ?  ? ?Requested Prescriptions  ?Pending Prescriptions Disp Refills  ? gabapentin (NEURONTIN) 800 MG tablet [Pharmacy Med Name: GABAPENTIN 800MG  TABLETS] 270 tablet 0  ?  Sig: TAKE 1 TABLET(800 MG) BY MOUTH THREE TIMES DAILY  ?  ? Neurology: Anticonvulsants - gabapentin Passed - 11/09/2021 12:50 PM  ?  ?  Passed - Cr in normal range and within 360 days  ?  Creat  ?Date Value Ref Range Status  ?08/12/2021 0.76 0.70 - 1.30 mg/dL Final  ?  ?  ?  ?  Passed - Completed PHQ-2 or PHQ-9 in the last 360 days  ?  ?  Passed - Valid encounter within last 12 months  ?  Recent Outpatient Visits   ? ?      ? 1 month ago Acute nasopharyngitis  ? [facility], 10/10/2021 W, NP  ? 2 months ago Acute non-recurrent frontal sinusitis  ? [facility] [place], [place], DO  ? 2 months ago Viral URI with cough  ? [facility] [place], Netta Neat, NP  ? 3 months ago Encounter for general adult medical examination with abnormal findings  ? [facility] [place], [place] W, NP  ? 6 months ago Chronic midline low back pain, unspecified whether sciatica present  ? [facility] [employer], [place], NP  ? ?  ?  ?Future Appointments   ? ?        ? In 3 months Baity, [facility], NP [facility], [facility]  ? ?  ? ?  ?  ?  ? ? ?

## 2021-11-11 DIAGNOSIS — M5136 Other intervertebral disc degeneration, lumbar region: Secondary | ICD-10-CM | POA: Diagnosis not present

## 2021-11-11 DIAGNOSIS — M9903 Segmental and somatic dysfunction of lumbar region: Secondary | ICD-10-CM | POA: Diagnosis not present

## 2021-11-11 DIAGNOSIS — M5416 Radiculopathy, lumbar region: Secondary | ICD-10-CM | POA: Diagnosis not present

## 2021-11-11 DIAGNOSIS — M5116 Intervertebral disc disorders with radiculopathy, lumbar region: Secondary | ICD-10-CM | POA: Diagnosis not present

## 2021-11-12 ENCOUNTER — Telehealth: Payer: Self-pay

## 2021-11-12 DIAGNOSIS — M5136 Other intervertebral disc degeneration, lumbar region: Secondary | ICD-10-CM | POA: Diagnosis not present

## 2021-11-12 DIAGNOSIS — M5116 Intervertebral disc disorders with radiculopathy, lumbar region: Secondary | ICD-10-CM | POA: Diagnosis not present

## 2021-11-12 DIAGNOSIS — M9903 Segmental and somatic dysfunction of lumbar region: Secondary | ICD-10-CM | POA: Diagnosis not present

## 2021-11-12 DIAGNOSIS — M5416 Radiculopathy, lumbar region: Secondary | ICD-10-CM | POA: Diagnosis not present

## 2021-11-12 NOTE — Telephone Encounter (Signed)
? ?  Mail encounter was:  Successful.  ?11/12/2021 ?Name: SLAYDE BRAULT MRN: [id number] DOB: 1966-06-17 ? ?THURMOND HILDEBRAN is a 56 y.o. year old male who is a primary care patient of Lorre Munroe, NP . The community resource team was consulted for assistance with Home Modifications ? ?Care guide performed the following interventions:  CG was advised to send all resources by mail per last phone call. CG has enclosed the following mail for home modifications. Enclosed:  ?Single Family Sport and exercise psychologist, [employer] [facility], [place] [employer] on Aging, Independent Living [place] [facility], as well as 5 other resources surrounding the county. ? ?Follow Up Plan:  Care guide will follow up with patient by phone over the next few days to ensure mail has been received. ? ?Hessie Knows ?Care Guide, Embedded Care Coordination ?[facility] Health  Care management  ?[place], [place] [address]  ?Main Phone: [phone]  E-mail: [email or web address]  ?Website: [email or web address] ? ? ? ?

## 2021-11-15 DIAGNOSIS — M5116 Intervertebral disc disorders with radiculopathy, lumbar region: Secondary | ICD-10-CM | POA: Diagnosis not present

## 2021-11-15 DIAGNOSIS — M9903 Segmental and somatic dysfunction of lumbar region: Secondary | ICD-10-CM | POA: Diagnosis not present

## 2021-11-15 DIAGNOSIS — M5416 Radiculopathy, lumbar region: Secondary | ICD-10-CM | POA: Diagnosis not present

## 2021-11-15 DIAGNOSIS — M5136 Other intervertebral disc degeneration, lumbar region: Secondary | ICD-10-CM | POA: Diagnosis not present

## 2021-11-17 DIAGNOSIS — M9903 Segmental and somatic dysfunction of lumbar region: Secondary | ICD-10-CM | POA: Diagnosis not present

## 2021-11-17 DIAGNOSIS — M5136 Other intervertebral disc degeneration, lumbar region: Secondary | ICD-10-CM | POA: Diagnosis not present

## 2021-11-17 DIAGNOSIS — M5416 Radiculopathy, lumbar region: Secondary | ICD-10-CM | POA: Diagnosis not present

## 2021-11-17 DIAGNOSIS — M5116 Intervertebral disc disorders with radiculopathy, lumbar region: Secondary | ICD-10-CM | POA: Diagnosis not present

## 2021-11-18 ENCOUNTER — Telehealth: Payer: Self-pay

## 2021-11-18 DIAGNOSIS — M5416 Radiculopathy, lumbar region: Secondary | ICD-10-CM | POA: Diagnosis not present

## 2021-11-18 DIAGNOSIS — M5116 Intervertebral disc disorders with radiculopathy, lumbar region: Secondary | ICD-10-CM | POA: Diagnosis not present

## 2021-11-18 DIAGNOSIS — M9903 Segmental and somatic dysfunction of lumbar region: Secondary | ICD-10-CM | POA: Diagnosis not present

## 2021-11-18 DIAGNOSIS — M5136 Other intervertebral disc degeneration, lumbar region: Secondary | ICD-10-CM | POA: Diagnosis not present

## 2021-11-18 NOTE — Telephone Encounter (Signed)
? ?  Telephone encounter was:  Successful.  ?11/18/2021 ?Name: QUILLAN WHITTER MRN: [id number] DOB: 01-Nov-1965 ? ?JAMEE PACHOLSKI is a 56 y.o. year old male who is a primary care patient of Lorre Munroe, NP . The community resource team was consulted for assistance with Home Modifications ? ?Care guide performed the following interventions:  Spouse has been advised mail was sent out last week . ? ?Follow Up Plan:  Care guide will follow up with patient by phone over the next few days to ensure mail has been received. ? ?Hessie Knows ?Care Guide, Embedded Care Coordination ?[facility] Health  Care management  ?[place], [place] [address]  ?Main Phone: [phone]  E-mail: [email or web address]  ?Website: [email or web address] ? ? ? ?

## 2021-11-18 NOTE — Telephone Encounter (Signed)
I have absolutely no idea what she is talking about. I know nothing about a ramp being built. Did this come from social work or the CCM team? ?

## 2021-11-18 NOTE — Telephone Encounter (Signed)
Copied from [place] [id number]. Topic: General - Other ?>> Nov 18, 2021 12:26 PM Leward Quan A wrote: ?Reason for CRM: Patient wife Nunzio Cory called in to inform Webb Silversmith that they have never heard from any one about the ramp that was to have been built. She stated that there have been no phone calls letters or any type of communication from anyone. Please call Nunzio Cory Ph# [phone] ?

## 2021-11-18 NOTE — Telephone Encounter (Signed)
Please review

## 2021-11-22 DIAGNOSIS — M5416 Radiculopathy, lumbar region: Secondary | ICD-10-CM | POA: Diagnosis not present

## 2021-11-22 DIAGNOSIS — M5116 Intervertebral disc disorders with radiculopathy, lumbar region: Secondary | ICD-10-CM | POA: Diagnosis not present

## 2021-11-22 DIAGNOSIS — M5136 Other intervertebral disc degeneration, lumbar region: Secondary | ICD-10-CM | POA: Diagnosis not present

## 2021-11-22 DIAGNOSIS — M9903 Segmental and somatic dysfunction of lumbar region: Secondary | ICD-10-CM | POA: Diagnosis not present

## 2021-11-24 DIAGNOSIS — M5116 Intervertebral disc disorders with radiculopathy, lumbar region: Secondary | ICD-10-CM | POA: Diagnosis not present

## 2021-11-24 DIAGNOSIS — M5416 Radiculopathy, lumbar region: Secondary | ICD-10-CM | POA: Diagnosis not present

## 2021-11-24 DIAGNOSIS — M9903 Segmental and somatic dysfunction of lumbar region: Secondary | ICD-10-CM | POA: Diagnosis not present

## 2021-11-24 DIAGNOSIS — M5136 Other intervertebral disc degeneration, lumbar region: Secondary | ICD-10-CM | POA: Diagnosis not present

## 2021-11-26 ENCOUNTER — Telehealth: Payer: Self-pay

## 2021-11-26 NOTE — Telephone Encounter (Signed)
? ?  Telephone encounter was:  Successful.  ?11/26/2021 ?Name: Brandon Myers MRN: [id number] DOB: [birth year] ? ?Brandon Myers is a 56 y.o. year old male who is a primary care patient of Lorre Munroe, NP . The community resource team was consulted for assistance with Home Modifications ? ?Care guide performed the following interventions: Follow up call placed to the patient to discuss status of referral. ? ?Follow Up Plan:  No further follow up planned at this time. The patient has been provided with needed resources. Mail has been received. CG and Spouse spoke about some of the programs that were sent out as she had a question. Questions and information about programs provided. Spouse will look over information with pt and call back if further questions or concerns arise.  ? ?Hessie Knows ?Care Guide, Embedded Care Coordination ?[facility] Health  Care management  ?[place], [place] [address]  ?Main Phone: [phone]  E-mail: [email or web address]  ?Website: [email or web address] ? ? ? ?

## 2021-11-29 DIAGNOSIS — M5116 Intervertebral disc disorders with radiculopathy, lumbar region: Secondary | ICD-10-CM | POA: Diagnosis not present

## 2021-11-29 DIAGNOSIS — M5416 Radiculopathy, lumbar region: Secondary | ICD-10-CM | POA: Diagnosis not present

## 2021-11-29 DIAGNOSIS — M5136 Other intervertebral disc degeneration, lumbar region: Secondary | ICD-10-CM | POA: Diagnosis not present

## 2021-11-29 DIAGNOSIS — M9903 Segmental and somatic dysfunction of lumbar region: Secondary | ICD-10-CM | POA: Diagnosis not present

## 2021-12-01 DIAGNOSIS — M5116 Intervertebral disc disorders with radiculopathy, lumbar region: Secondary | ICD-10-CM | POA: Diagnosis not present

## 2021-12-01 DIAGNOSIS — M5136 Other intervertebral disc degeneration, lumbar region: Secondary | ICD-10-CM | POA: Diagnosis not present

## 2021-12-01 DIAGNOSIS — M5416 Radiculopathy, lumbar region: Secondary | ICD-10-CM | POA: Diagnosis not present

## 2021-12-01 DIAGNOSIS — M9903 Segmental and somatic dysfunction of lumbar region: Secondary | ICD-10-CM | POA: Diagnosis not present

## 2021-12-02 DIAGNOSIS — M5116 Intervertebral disc disorders with radiculopathy, lumbar region: Secondary | ICD-10-CM | POA: Diagnosis not present

## 2021-12-02 DIAGNOSIS — M5136 Other intervertebral disc degeneration, lumbar region: Secondary | ICD-10-CM | POA: Diagnosis not present

## 2021-12-02 DIAGNOSIS — M9903 Segmental and somatic dysfunction of lumbar region: Secondary | ICD-10-CM | POA: Diagnosis not present

## 2021-12-02 DIAGNOSIS — M5416 Radiculopathy, lumbar region: Secondary | ICD-10-CM | POA: Diagnosis not present

## 2021-12-06 DIAGNOSIS — M5116 Intervertebral disc disorders with radiculopathy, lumbar region: Secondary | ICD-10-CM | POA: Diagnosis not present

## 2021-12-06 DIAGNOSIS — M9903 Segmental and somatic dysfunction of lumbar region: Secondary | ICD-10-CM | POA: Diagnosis not present

## 2021-12-06 DIAGNOSIS — M5416 Radiculopathy, lumbar region: Secondary | ICD-10-CM | POA: Diagnosis not present

## 2021-12-06 DIAGNOSIS — M5136 Other intervertebral disc degeneration, lumbar region: Secondary | ICD-10-CM | POA: Diagnosis not present

## 2021-12-08 DIAGNOSIS — M5116 Intervertebral disc disorders with radiculopathy, lumbar region: Secondary | ICD-10-CM | POA: Diagnosis not present

## 2021-12-08 DIAGNOSIS — M5416 Radiculopathy, lumbar region: Secondary | ICD-10-CM | POA: Diagnosis not present

## 2021-12-08 DIAGNOSIS — M5136 Other intervertebral disc degeneration, lumbar region: Secondary | ICD-10-CM | POA: Diagnosis not present

## 2021-12-08 DIAGNOSIS — M9903 Segmental and somatic dysfunction of lumbar region: Secondary | ICD-10-CM | POA: Diagnosis not present

## 2021-12-09 DIAGNOSIS — M9903 Segmental and somatic dysfunction of lumbar region: Secondary | ICD-10-CM | POA: Diagnosis not present

## 2021-12-09 DIAGNOSIS — M5116 Intervertebral disc disorders with radiculopathy, lumbar region: Secondary | ICD-10-CM | POA: Diagnosis not present

## 2021-12-09 DIAGNOSIS — Z89611 Acquired absence of right leg above knee: Secondary | ICD-10-CM | POA: Diagnosis not present

## 2021-12-09 DIAGNOSIS — M5416 Radiculopathy, lumbar region: Secondary | ICD-10-CM | POA: Diagnosis not present

## 2021-12-09 DIAGNOSIS — M5136 Other intervertebral disc degeneration, lumbar region: Secondary | ICD-10-CM | POA: Diagnosis not present

## 2021-12-14 DIAGNOSIS — M9903 Segmental and somatic dysfunction of lumbar region: Secondary | ICD-10-CM | POA: Diagnosis not present

## 2021-12-14 DIAGNOSIS — M5416 Radiculopathy, lumbar region: Secondary | ICD-10-CM | POA: Diagnosis not present

## 2021-12-14 DIAGNOSIS — M5116 Intervertebral disc disorders with radiculopathy, lumbar region: Secondary | ICD-10-CM | POA: Diagnosis not present

## 2021-12-14 DIAGNOSIS — M5136 Other intervertebral disc degeneration, lumbar region: Secondary | ICD-10-CM | POA: Diagnosis not present

## 2021-12-16 DIAGNOSIS — M9903 Segmental and somatic dysfunction of lumbar region: Secondary | ICD-10-CM | POA: Diagnosis not present

## 2021-12-16 DIAGNOSIS — M5416 Radiculopathy, lumbar region: Secondary | ICD-10-CM | POA: Diagnosis not present

## 2021-12-16 DIAGNOSIS — M5116 Intervertebral disc disorders with radiculopathy, lumbar region: Secondary | ICD-10-CM | POA: Diagnosis not present

## 2021-12-16 DIAGNOSIS — M5136 Other intervertebral disc degeneration, lumbar region: Secondary | ICD-10-CM | POA: Diagnosis not present

## 2021-12-22 DIAGNOSIS — M5136 Other intervertebral disc degeneration, lumbar region: Secondary | ICD-10-CM | POA: Diagnosis not present

## 2021-12-22 DIAGNOSIS — M5416 Radiculopathy, lumbar region: Secondary | ICD-10-CM | POA: Diagnosis not present

## 2021-12-22 DIAGNOSIS — M5116 Intervertebral disc disorders with radiculopathy, lumbar region: Secondary | ICD-10-CM | POA: Diagnosis not present

## 2021-12-22 DIAGNOSIS — M9903 Segmental and somatic dysfunction of lumbar region: Secondary | ICD-10-CM | POA: Diagnosis not present

## 2021-12-24 DIAGNOSIS — M5136 Other intervertebral disc degeneration, lumbar region: Secondary | ICD-10-CM | POA: Diagnosis not present

## 2021-12-24 DIAGNOSIS — M5416 Radiculopathy, lumbar region: Secondary | ICD-10-CM | POA: Diagnosis not present

## 2021-12-24 DIAGNOSIS — M9903 Segmental and somatic dysfunction of lumbar region: Secondary | ICD-10-CM | POA: Diagnosis not present

## 2021-12-24 DIAGNOSIS — M5116 Intervertebral disc disorders with radiculopathy, lumbar region: Secondary | ICD-10-CM | POA: Diagnosis not present

## 2021-12-29 DIAGNOSIS — M5136 Other intervertebral disc degeneration, lumbar region: Secondary | ICD-10-CM | POA: Diagnosis not present

## 2021-12-29 DIAGNOSIS — M9903 Segmental and somatic dysfunction of lumbar region: Secondary | ICD-10-CM | POA: Diagnosis not present

## 2021-12-29 DIAGNOSIS — M5416 Radiculopathy, lumbar region: Secondary | ICD-10-CM | POA: Diagnosis not present

## 2021-12-29 DIAGNOSIS — M5116 Intervertebral disc disorders with radiculopathy, lumbar region: Secondary | ICD-10-CM | POA: Diagnosis not present

## 2021-12-30 DIAGNOSIS — M5136 Other intervertebral disc degeneration, lumbar region: Secondary | ICD-10-CM | POA: Diagnosis not present

## 2021-12-30 DIAGNOSIS — M5116 Intervertebral disc disorders with radiculopathy, lumbar region: Secondary | ICD-10-CM | POA: Diagnosis not present

## 2021-12-30 DIAGNOSIS — M5416 Radiculopathy, lumbar region: Secondary | ICD-10-CM | POA: Diagnosis not present

## 2021-12-30 DIAGNOSIS — M9903 Segmental and somatic dysfunction of lumbar region: Secondary | ICD-10-CM | POA: Diagnosis not present

## 2022-01-04 ENCOUNTER — Other Ambulatory Visit: Payer: Self-pay | Admitting: Internal Medicine

## 2022-01-04 DIAGNOSIS — K219 Gastro-esophageal reflux disease without esophagitis: Secondary | ICD-10-CM

## 2022-01-05 NOTE — Telephone Encounter (Signed)
Early request sent via Interface. LRF 11/10/21 #90 Requested Prescriptions  Pending Prescriptions Disp Refills  . omeprazole (PRILOSEC) 40 MG capsule [Pharmacy Med Name: OMEPRAZOLE 40MG  CAPSULES] 90 capsule 0    Sig: TAKE 1 CAPSULE(40 MG) BY MOUTH DAILY     Gastroenterology: Proton Pump Inhibitors Passed - 01/04/2022 10:48 PM      Passed - Valid encounter within last 12 months    Recent Outpatient Visits          2 months ago Acute nasopharyngitis   [facility] [place], [place] W, NP   4 months ago Acute non-recurrent frontal sinusitis   [facility] [place], [place], DO   4 months ago Viral URI with cough   [facility] [place], [place], NP   4 months ago Encounter for general adult medical examination with abnormal findings   [facility] [place], [place], NP   8 months ago Chronic midline low back pain, unspecified whether sciatica present   [facility], NP      Future Appointments            In 1 month [place], [place], NP [facility]           Signed Prescriptions Disp Refills   nortriptyline (PAMELOR) 25 MG capsule 30 capsule 0    Sig: TAKE 1 CAPSULE(25 MG) BY MOUTH AT BEDTIME     Psychiatry:  Antidepressants - Heterocyclics (TCAs) Passed - 01/04/2022 10:48 PM      Passed - Valid encounter within last 6 months    Recent Outpatient Visits          2 months ago Acute nasopharyngitis   [facility] [place], [place], NP   4 months ago Acute non-recurrent frontal sinusitis   [facility] [place], [place], DO   4 months ago Viral URI with cough   [facility] [place], [place], NP   4 months ago Encounter for general adult medical examination with abnormal findings   [facility] [place], [place], NP   8 months ago Chronic midline low back pain, unspecified whether sciatica present   [facility] [place], [place], NP      Future Appointments            In 1 month Baity, Salvad[place], NP [facility]

## 2022-01-05 NOTE — Telephone Encounter (Signed)
Requested Prescriptions  Pending Prescriptions Disp Refills  . nortriptyline (PAMELOR) 25 MG capsule [Pharmacy Med Name: NORTRIPTYLINE 25MG  CAPSULES] 30 capsule 0    Sig: TAKE 1 CAPSULE(25 MG) BY MOUTH AT BEDTIME     Psychiatry:  Antidepressants - Heterocyclics (TCAs) Passed - 01/04/2022 10:48 PM      Passed - Valid encounter within last 6 months    Recent Outpatient Visits          2 months ago Acute nasopharyngitis   [facility] [place], [place] W, NP   4 months ago Acute non-recurrent frontal sinusitis   [facility] [place], [place], DO   4 months ago Viral URI with cough   [facility] [place], [place], NP   4 months ago Encounter for general adult medical examination with abnormal findings   [facility] [place], [place], NP   8 months ago Chronic midline low back pain, unspecified whether sciatica present   [facility] [place], [place], NP      Future Appointments            In 1 month Baity, [place], NP [facility]           . omeprazole (PRILOSEC) 40 MG capsule [Pharmacy Med Name: OMEPRAZOLE 40MG  CAPSULES] 90 capsule 0    Sig: TAKE 1 CAPSULE(40 MG) BY MOUTH DAILY     Gastroenterology: Proton Pump Inhibitors Passed - 01/04/2022 10:48 PM      Passed - Valid encounter within last 12 months    Recent Outpatient Visits          2 months ago Acute nasopharyngitis   [facility] [place], [facility] W, NP   4 months ago Acute non-recurrent frontal sinusitis   [facility] [place], [facility], DO   4 months ago Viral URI with cough   [facility] [place], [facility], NP   4 months ago Encounter for general adult medical examination with abnormal findings   [facility] [place], [facility], NP   8 months ago Chronic midline low back pain, unspecified whether sciatica present   [facility], [place], NP       Future Appointments            In 1 month Baity, [facility], NP [facility]

## 2022-01-06 ENCOUNTER — Other Ambulatory Visit: Payer: Self-pay | Admitting: Internal Medicine

## 2022-01-06 DIAGNOSIS — M5116 Intervertebral disc disorders with radiculopathy, lumbar region: Secondary | ICD-10-CM | POA: Diagnosis not present

## 2022-01-06 DIAGNOSIS — K219 Gastro-esophageal reflux disease without esophagitis: Secondary | ICD-10-CM

## 2022-01-06 DIAGNOSIS — M5416 Radiculopathy, lumbar region: Secondary | ICD-10-CM | POA: Diagnosis not present

## 2022-01-06 DIAGNOSIS — M9903 Segmental and somatic dysfunction of lumbar region: Secondary | ICD-10-CM | POA: Diagnosis not present

## 2022-01-06 DIAGNOSIS — M5136 Other intervertebral disc degeneration, lumbar region: Secondary | ICD-10-CM | POA: Diagnosis not present

## 2022-01-07 ENCOUNTER — Other Ambulatory Visit: Payer: Self-pay | Admitting: Internal Medicine

## 2022-01-07 DIAGNOSIS — K219 Gastro-esophageal reflux disease without esophagitis: Secondary | ICD-10-CM

## 2022-01-07 MED ORDER — OMEPRAZOLE 40 MG PO CPDR: DELAYED_RELEASE_CAPSULE | ORAL | 0 refills | Status: DC

## 2022-01-07 MED ORDER — OMEPRAZOLE 40 MG PO CPDR: 40.0000 mg | DELAYED_RELEASE_CAPSULE | Freq: Every day | ORAL | 0 refills | Status: DC

## 2022-01-12 DIAGNOSIS — M5136 Other intervertebral disc degeneration, lumbar region: Secondary | ICD-10-CM | POA: Diagnosis not present

## 2022-01-12 DIAGNOSIS — M5416 Radiculopathy, lumbar region: Secondary | ICD-10-CM | POA: Diagnosis not present

## 2022-01-12 DIAGNOSIS — M5116 Intervertebral disc disorders with radiculopathy, lumbar region: Secondary | ICD-10-CM | POA: Diagnosis not present

## 2022-01-12 DIAGNOSIS — M9903 Segmental and somatic dysfunction of lumbar region: Secondary | ICD-10-CM | POA: Diagnosis not present

## 2022-01-19 DIAGNOSIS — M5116 Intervertebral disc disorders with radiculopathy, lumbar region: Secondary | ICD-10-CM | POA: Diagnosis not present

## 2022-01-19 DIAGNOSIS — M5136 Other intervertebral disc degeneration, lumbar region: Secondary | ICD-10-CM | POA: Diagnosis not present

## 2022-01-19 DIAGNOSIS — M9903 Segmental and somatic dysfunction of lumbar region: Secondary | ICD-10-CM | POA: Diagnosis not present

## 2022-01-19 DIAGNOSIS — M5416 Radiculopathy, lumbar region: Secondary | ICD-10-CM | POA: Diagnosis not present

## 2022-01-26 ENCOUNTER — Other Ambulatory Visit: Payer: Self-pay | Admitting: Internal Medicine

## 2022-01-26 DIAGNOSIS — M5136 Other intervertebral disc degeneration, lumbar region: Secondary | ICD-10-CM | POA: Diagnosis not present

## 2022-01-26 DIAGNOSIS — M9903 Segmental and somatic dysfunction of lumbar region: Secondary | ICD-10-CM | POA: Diagnosis not present

## 2022-01-26 DIAGNOSIS — M5116 Intervertebral disc disorders with radiculopathy, lumbar region: Secondary | ICD-10-CM | POA: Diagnosis not present

## 2022-01-26 DIAGNOSIS — M5416 Radiculopathy, lumbar region: Secondary | ICD-10-CM | POA: Diagnosis not present

## 2022-01-26 NOTE — Telephone Encounter (Signed)
Requested Prescriptions  Pending Prescriptions Disp Refills  . metoprolol succinate (TOPROL-XL) 100 MG 24 hr tablet [Pharmacy Med Name: METOPROLOL ER SUCCINATE 100MG  TABS] 90 tablet 0    Sig: TAKE 1 TABLET BY MOUTH EVERY DAY WITH OR IMMEDIATELY FOLLOWING A MEAL     Cardiovascular:  Beta Blockers Passed - 01/26/2022  9:12 AM      Passed - Last BP in normal range    BP Readings from Last 1 Encounters:  10/11/21 135/81         Passed - Last Heart Rate in normal range    Pulse Readings from Last 1 Encounters:  10/11/21 71         Passed - Valid encounter within last 6 months    Recent Outpatient Visits          3 months ago Acute nasopharyngitis   [facility] [place], [place], NP   4 months ago Acute non-recurrent frontal sinusitis   [facility] [place], [place], DO   5 months ago Viral URI with cough   [facility] [place], [place], NP   5 months ago Encounter for general adult medical examination with abnormal findings   [facility] [place], [place], NP   9 months ago Chronic midline low back pain, unspecified whether sciatica present   [facility] [place], [place], NP      Future Appointments            In 3 weeks Salvadore Oxford, Sampson Si, NP [facility], [facility]

## 2022-02-03 DIAGNOSIS — M5116 Intervertebral disc disorders with radiculopathy, lumbar region: Secondary | ICD-10-CM | POA: Diagnosis not present

## 2022-02-03 DIAGNOSIS — M5136 Other intervertebral disc degeneration, lumbar region: Secondary | ICD-10-CM | POA: Diagnosis not present

## 2022-02-03 DIAGNOSIS — M9903 Segmental and somatic dysfunction of lumbar region: Secondary | ICD-10-CM | POA: Diagnosis not present

## 2022-02-03 DIAGNOSIS — M5416 Radiculopathy, lumbar region: Secondary | ICD-10-CM | POA: Diagnosis not present

## 2022-02-04 ENCOUNTER — Other Ambulatory Visit: Payer: Self-pay | Admitting: Internal Medicine

## 2022-02-04 DIAGNOSIS — G546 Phantom limb syndrome with pain: Secondary | ICD-10-CM

## 2022-02-04 NOTE — Telephone Encounter (Signed)
Requested Prescriptions  Pending Prescriptions Disp Refills  . gabapentin (NEURONTIN) 800 MG tablet [Pharmacy Med Name: GABAPENTIN 800MG  TABLETS] 270 tablet 0    Sig: TAKE 1 TABLET(800 MG) BY MOUTH THREE TIMES DAILY     Neurology: Anticonvulsants - gabapentin Passed - 02/04/2022 10:10 AM      Passed - Cr in normal range and within 360 days    Creat  Date Value Ref Range Status  08/12/2021 0.76 0.70 - 1.30 mg/dL Final         Passed - Completed PHQ-2 or PHQ-9 in the last 360 days      Passed - Valid encounter within last 12 months    Recent Outpatient Visits          3 months ago Acute nasopharyngitis   [facility], [place]ins W, NP   5 months ago Acute non-recurrent frontal sinusitis   [facility] [place], [place], DO   5 months ago Viral URI with cough   [facility] [place], [place], NP   5 months ago Encounter for general adult medical examination with abnormal findings   [facility] [place], [place], NP   9 months ago Chronic midline low back pain, unspecified whether sciatica present   [facility] [place], [place], NP      Future Appointments            In 1 week Salvadore Oxford, Sampson Si, NP [facility]

## 2022-02-09 ENCOUNTER — Other Ambulatory Visit: Payer: Self-pay | Admitting: Internal Medicine

## 2022-02-09 DIAGNOSIS — I10 Essential (primary) hypertension: Secondary | ICD-10-CM

## 2022-02-09 NOTE — Telephone Encounter (Signed)
Requested Prescriptions  Pending Prescriptions Disp Refills  . nortriptyline (PAMELOR) 25 MG capsule [Pharmacy Med Name: NORTRIPTYLINE 25MG  CAPSULES] 90 capsule 0    Sig: TAKE 1 CAPSULE(25 MG) BY MOUTH AT BEDTIME     Psychiatry:  Antidepressants - Heterocyclics (TCAs) Passed - 02/09/2022 12:55 AM      Passed - Valid encounter within last 6 months    Recent Outpatient Visits          4 months ago Acute nasopharyngitis   [facility] [place], Mullins W, NP   5 months ago Acute non-recurrent frontal sinusitis   [facility] [place], [place], DO   5 months ago Viral URI with cough   [facility] [place], [place], NP   6 months ago Encounter for general adult medical examination with abnormal findings   [facility] [place], [place], NP   9 months ago Chronic midline low back pain, unspecified whether sciatica present   [facility] [place], [place], NP      Future Appointments            In 1 week Salvadore Oxford, Sampson Si, NP [facility]           . losartan (COZAAR) 100 MG tablet [Pharmacy Med Name: LOSARTAN 100MG  TABLETS] 90 tablet 1    Sig: TAKE 1 TABLET(100 MG) BY MOUTH DAILY     Cardiovascular:  Angiotensin Receptor Blockers Failed - 02/09/2022 12:55 AM      Failed - Cr in normal range and within 180 days    Creat  Date Value Ref Range Status  08/12/2021 0.76 0.70 - 1.30 mg/dL Final         Failed - K in normal range and within 180 days    Potassium  Date Value Ref Range Status  08/12/2021 4.5 3.5 - 5.3 mmol/L Final         Passed - Patient is not pregnant      Passed - Last BP in normal range    BP Readings from Last 1 Encounters:  10/11/21 135/81         Passed - Valid encounter within last 6 months    Recent Outpatient Visits          4 months ago Acute nasopharyngitis   [facility] [place], [facility], NP   5 months ago Acute non-recurrent frontal  sinusitis   [facility] [place], [facility], DO   5 months ago Viral URI with cough   [facility] [place], [facility], NP   6 months ago Encounter for general adult medical examination with abnormal findings   [facility] [place], [facility], NP   9 months ago Chronic midline low back pain, unspecified whether sciatica present   [facility], [facility], NP      Future Appointments            In 1 week Mullins, Salvadore Oxford, NP [facility], [facility]

## 2022-02-10 ENCOUNTER — Ambulatory Visit: Payer: Medicare Other | Admitting: Internal Medicine

## 2022-02-17 ENCOUNTER — Ambulatory Visit: Payer: Medicare Other | Admitting: Internal Medicine

## 2022-02-17 DIAGNOSIS — M9903 Segmental and somatic dysfunction of lumbar region: Secondary | ICD-10-CM | POA: Diagnosis not present

## 2022-02-17 DIAGNOSIS — M5416 Radiculopathy, lumbar region: Secondary | ICD-10-CM | POA: Diagnosis not present

## 2022-02-17 DIAGNOSIS — M5136 Other intervertebral disc degeneration, lumbar region: Secondary | ICD-10-CM | POA: Diagnosis not present

## 2022-02-17 DIAGNOSIS — M5116 Intervertebral disc disorders with radiculopathy, lumbar region: Secondary | ICD-10-CM | POA: Diagnosis not present

## 2022-03-03 ENCOUNTER — Ambulatory Visit (INDEPENDENT_AMBULATORY_CARE_PROVIDER_SITE_OTHER): Payer: Medicare Other | Admitting: Internal Medicine

## 2022-03-03 ENCOUNTER — Encounter: Payer: Self-pay | Admitting: Internal Medicine

## 2022-03-03 VITALS — BP 134/86 | HR 80 | Temp 96.4°F | Wt 211.0 lb

## 2022-03-03 DIAGNOSIS — G546 Phantom limb syndrome with pain: Secondary | ICD-10-CM | POA: Diagnosis not present

## 2022-03-03 DIAGNOSIS — K219 Gastro-esophageal reflux disease without esophagitis: Secondary | ICD-10-CM

## 2022-03-03 DIAGNOSIS — M9903 Segmental and somatic dysfunction of lumbar region: Secondary | ICD-10-CM | POA: Diagnosis not present

## 2022-03-03 DIAGNOSIS — G894 Chronic pain syndrome: Secondary | ICD-10-CM

## 2022-03-03 DIAGNOSIS — K5903 Drug induced constipation: Secondary | ICD-10-CM | POA: Diagnosis not present

## 2022-03-03 DIAGNOSIS — D700 Congenital agranulocytosis: Secondary | ICD-10-CM | POA: Diagnosis not present

## 2022-03-03 DIAGNOSIS — D649 Anemia, unspecified: Secondary | ICD-10-CM

## 2022-03-03 DIAGNOSIS — E781 Pure hyperglyceridemia: Secondary | ICD-10-CM

## 2022-03-03 DIAGNOSIS — I10 Essential (primary) hypertension: Secondary | ICD-10-CM | POA: Diagnosis not present

## 2022-03-03 DIAGNOSIS — S48911S Complete traumatic amputation of right shoulder and upper arm, level unspecified, sequela: Secondary | ICD-10-CM

## 2022-03-03 DIAGNOSIS — F419 Anxiety disorder, unspecified: Secondary | ICD-10-CM

## 2022-03-03 DIAGNOSIS — S88919A Complete traumatic amputation of unspecified lower leg, level unspecified, initial encounter: Secondary | ICD-10-CM

## 2022-03-03 DIAGNOSIS — M5136 Other intervertebral disc degeneration, lumbar region: Secondary | ICD-10-CM | POA: Diagnosis not present

## 2022-03-03 DIAGNOSIS — Z6832 Body mass index (BMI) 32.0-32.9, adult: Secondary | ICD-10-CM

## 2022-03-03 DIAGNOSIS — M5416 Radiculopathy, lumbar region: Secondary | ICD-10-CM | POA: Diagnosis not present

## 2022-03-03 DIAGNOSIS — E6609 Other obesity due to excess calories: Secondary | ICD-10-CM

## 2022-03-03 DIAGNOSIS — M5116 Intervertebral disc disorders with radiculopathy, lumbar region: Secondary | ICD-10-CM | POA: Diagnosis not present

## 2022-03-03 HISTORY — DX: Anemia, unspecified: D64.9

## 2022-03-03 MED ORDER — PREDNISONE 10 MG PO TABS: ORAL_TABLET | ORAL | 0 refills | Status: DC

## 2022-03-03 MED ORDER — GABAPENTIN 800 MG PO TABS: ORAL_TABLET | ORAL | 1 refills | Status: DC

## 2022-03-03 MED ORDER — TIZANIDINE HCL 4 MG PO TABS
4.0000 mg | ORAL_TABLET | Freq: Every evening | ORAL | 1 refills | Status: DC | PRN
Start: 2022-03-03 — End: 2024-04-11

## 2022-03-03 MED ORDER — OMEPRAZOLE 40 MG PO CPDR: 40.0000 mg | DELAYED_RELEASE_CAPSULE | Freq: Every day | ORAL | 1 refills | Status: DC

## 2022-03-03 MED ORDER — NORTRIPTYLINE HCL 25 MG PO CAPS
ORAL_CAPSULE | ORAL | 1 refills | Status: DC
Start: 2022-03-03 — End: 2022-11-10

## 2022-03-03 MED ORDER — PROMETHAZINE-DM 6.25-15 MG/5ML PO SYRP: 5.0000 mL | ORAL_SOLUTION | Freq: Four times a day (QID) | ORAL | 0 refills | Status: DC | PRN

## 2022-03-03 MED ORDER — GABAPENTIN 300 MG PO CAPS: 300.0000 mg | ORAL_CAPSULE | Freq: Every day | ORAL | 1 refills | Status: DC

## 2022-03-03 MED ORDER — METOPROLOL SUCCINATE ER 100 MG PO TB24: ORAL_TABLET | ORAL | 1 refills | Status: DC

## 2022-03-03 NOTE — Assessment & Plan Note (Signed)
Continue meloxicam, Zanaflex, nortriptyline gabapentin

## 2022-03-03 NOTE — Assessment & Plan Note (Signed)
CBC today.  

## 2022-03-03 NOTE — Assessment & Plan Note (Signed)
Controlled on losartan and metoprolol Reinforced DASH diet and exercise for weight loss C-Met today

## 2022-03-03 NOTE — Assessment & Plan Note (Signed)
Does not have prosthesis

## 2022-03-03 NOTE — Patient Instructions (Signed)

## 2022-03-03 NOTE — Assessment & Plan Note (Signed)
Avoid foods that trigger reflux Encourage weight loss as this can produce reflux symptoms Continue omeprazole 

## 2022-03-03 NOTE — Assessment & Plan Note (Signed)
Continue meloxicam, Zanaflex, nortriptyline gabapentin 

## 2022-03-03 NOTE — Progress Notes (Signed)
Subjective:    Patient ID: Brandon Myers, male    DOB: 01-18-1966, 56 y.o.   MRN: [id number]  HPI  Patient presents to clinic today for 53-month follow-up of chronic conditions.  HTN: His BP today is 134/86.  He is taking Losartan and Metoprolol as prescribed.  ECG from 09/2018 reviewed.  HLD: His last LDL was 60, triglycerides 268, 08/2021.  He is not taking any cholesterol-lowering medication at this time.  He does not consume a low-fat diet.  GERD with History of Esophageal Stricture: He denies breakthrough on Omeprazole.  There is no upper GI on file.  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  He is taking Meloxicam, Zanaflex, Nortriptyline and Gabapentin as prescribed.  He follows with pain management.  Anxiety: Currently not an issue.  He is not currently taking any medications for this.  He is not seeing a therapist.  He denies depression, SI/HI.  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 Managed with laxative OTC as needed.  There is no colonoscopy on file.  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  He is taking Filgrastim as prescribed.  He follows with hematology.  Anemia: His last H/H was 10.9/35.3, 08/2021.  He is not taking any oral iron at this time.  He follows with hematology.  Review of Systems     Past Medical History:  Diagnosis Date   Allergy    Anxiety 01/21/2020   Controlled substance agreement signed 04/02/2017   GERD (gastroesophageal reflux disease)    Hypertension    Neutropenia, congenital (HCC)    Phantom limb pain (HCC)     Current Outpatient Medications  Medication Sig Dispense Refill   acetaminophen (TYLENOL) 650 MG CR tablet Take 1,300 mg by mouth 2 (two) times daily as needed for pain.     albuterol (VENTOLIN HFA) 108 (90 Base) MCG/ACT inhaler INHALE 2 PUFFS BY MOUTH FOUR TIMES DAILY AS NEEDED 18 g 12   filgrastim-sndz (ZARXIO) 480 MCG/0.8ML SOSY injection  Inject 0.8 mLs into the muscle every other day.     gabapentin (NEURONTIN) 300 MG capsule Take 1 capsule (300 mg total) by mouth daily. 90 capsule 0   gabapentin (NEURONTIN) 800 MG tablet TAKE 1 TABLET(800 MG) BY MOUTH THREE TIMES DAILY 270 tablet 0   loratadine (CLARITIN) 10 MG tablet TAKE 1 TABLET(10 MG) BY MOUTH DAILY (Patient taking differently: daily as needed.) 90 tablet 3   losartan (COZAAR) 100 MG tablet TAKE 1 TABLET(100 MG) BY MOUTH DAILY 90 tablet 1   meloxicam (MOBIC) 7.5 MG tablet Take 1 tablet (7.5 mg total) by mouth daily. (Patient taking differently: Take 7.5 mg by mouth daily as needed.) 90 tablet 1   metoprolol succinate (TOPROL-XL) 100 MG 24 hr tablet TAKE 1 TABLET BY MOUTH EVERY DAY WITH OR IMMEDIATELY FOLLOWING A MEAL 90 tablet 0   nortriptyline (PAMELOR) 25 MG capsule TAKE 1 CAPSULE(25 MG) BY MOUTH AT BEDTIME 90 capsule 0   omeprazole (PRILOSEC) 40 MG capsule Take 1 capsule (40 mg total) by mouth daily. 90 capsule 0   tiZANidine (ZANAFLEX) 4 MG tablet Take 1 tablet (4 mg total) by mouth at bedtime as needed for muscle spasms. 90 tablet 0   No current facility-administered medications for this visit.    Allergies  Allergen Reactions   Enalapril Cough    ACEi Cough   Hydrochlorothiazide     Sweats, hot flashes   Prochlorperazine  Other reaction(s): Other (See Comments) As a child unable to recall   Sulfa Antibiotics Other (See Comments)    Other reaction(s): Other (See Comments) As a child unable to recall   Benadryl [Diphenhydramine]     Increased phantom pain   Strawberry Extract Rash    Family History  Problem Relation Age of Onset   Heart Problems Mother    Asthma Mother    Depression Mother    Thyroid disease Mother    Alzheimer's disease Mother    Hypertension Mother    Stroke Maternal Grandmother    Cancer Maternal Grandmother    Cancer Father        skin   Hypertension Father    Cancer Paternal Grandmother    Cancer Paternal Grandfather      Social History   Socioeconomic History   Marital status: Married    Spouse name: Emmerson Taddei   Number of children: 2   Years of education: Not on file   Highest education level: Associate degree: academic program  Occupational History   Occupation: Disabled  Tobacco Use   Smoking status: Never   Smokeless tobacco: Never  Vaping Use   Vaping Use: Never used  Substance and Sexual Activity   Alcohol use: Never    Alcohol/week: 0.0 standard drinks of alcohol   Drug use: Never   Sexual activity: Never  Other Topics Concern   Not on file  Social History Narrative   Not on file   Social Determinants of Health   Financial Resource Strain: Low Risk  (02/28/2018)   Overall Financial Resource Strain (CARDIA)    Difficulty of Paying Living Expenses: Not very hard  Food Insecurity: No Food Insecurity (10/21/2021)   Hunger Vital Sign    Worried About Running Out of Food in the Last Year: Never true    Ran Out of Food in the Last Year: Never true  Transportation Needs: No Transportation Needs (10/21/2021)   PRAPARE - Administrator, Civil Service (Medical): No    Lack of Transportation (Non-Medical): No  Physical Activity: Inactive (10/21/2021)   Exercise Vital Sign    Days of Exercise per Week: 0 days    Minutes of Exercise per Session: 0 min  Stress: No Stress Concern Present (10/21/2021)   [employer] of Occupational Health - Occupational Stress Questionnaire    Feeling of Stress : Not at all  Social Connections: Socially Integrated (10/21/2021)   Social Connection and Isolation Panel [NHANES]    Frequency of Communication with Friends and Family: Once a week    Frequency of Social Gatherings with Friends and Family: More than three times a week    Attends Religious Services: More than 4 times per year    Active Member of [employer] or Organizations: Yes    Attends Engineer, structural: More than 4 times per year    Marital Status: Married  Careers information officer Violence: Not At Risk (10/21/2021)   Humiliation, Afraid, Rape, and Kick questionnaire    Fear of Current or Ex-Partner: No    Emotionally Abused: No    Physically Abused: No    Sexually Abused: No     Constitutional: Denies fever, malaise, fatigue, headache or abrupt weight changes.  HEENT: Patient reports postnasal drip.  Denies eye pain, eye redness, ear pain, ringing in the ears, wax buildup, runny nose, nasal congestion, bloody nose, or sore throat. Respiratory: Patient reports cough.  Denies difficulty breathing, shortness of breath.   Cardiovascular:  Denies chest pain, chest tightness, palpitations or swelling in the hands or feet.  Gastrointestinal: Patient reports constipation.  Denies abdominal pain, bloating, diarrhea or blood in the stool.  GU: Denies urgency, frequency, pain with urination, burning sensation, blood in urine, odor or discharge. Musculoskeletal: Patient reports chronic pain.  Denies decrease in range of motion, difficulty with gait, or joint swelling.  Skin: Denies redness, rashes, lesions or ulcercations.  Neurological: Patient reports neuropathic pain.  Denies dizziness, difficulty with memory, difficulty with speech or problems with balance and coordination.  Psych: Patient has a history of anxiety.  Denies depression, SI/HI.  No other specific complaints in a complete review of systems (except as listed in HPI above).  Objective:   Physical Exam  BP 134/86 (BP Location: Left Arm, Patient Position: Sitting, Cuff Size: Large)   Pulse 80   Temp (!) 96.4 F (35.8 C) (Temporal)   Wt 211 lb (95.7 kg)   SpO2 100%   BMI 32.08 kg/m   Wt Readings from Last 3 Encounters:  10/11/21 208 lb (94.3 kg)  08/25/21 206 lb (93.4 kg)  08/12/21 202 lb 12.8 oz (92 kg)    General: Appears his stated age, obese, in NAD. Skin: Warm and intact.  Clammy HEENT: Head: normal shape and size; Eyes: sclera white, no icterus, conjunctiva pink, PERRLA and EOMs intact;   Neck: No adenopathy noted. Cardiovascular: Normal rate and rhythm. S1,S2 noted.  No murmur, rubs or gallops noted. No JVD or LLE edema. No carotid bruits noted. Pulmonary/Chest: Normal effort and positive vesicular breath sounds. No respiratory distress. No wheezes, rales or ronchi noted.  Abdomen: Soft and nontender. Normal bowel sounds.  Musculoskeletal: Absence of right upper extremity.  Prosthesis noted of RLE.  No difficulty with gait.  Neurological: Alert and oriented.  Psychiatric: Mood and affect normal. Behavior is normal. Judgment and thought content normal.    BMET    Component Value Date/Time   NA 138 08/12/2021 1450   NA 139 12/23/2019 1447   K 4.5 08/12/2021 1450   CL 102 08/12/2021 1450   CO2 30 08/12/2021 1450   GLUCOSE 116 08/12/2021 1450   BUN 13 08/12/2021 1450   BUN 13 12/23/2019 1447   CREATININE 0.76 08/12/2021 1450   CALCIUM 9.3 08/12/2021 1450   GFRNONAA 100 12/23/2019 1447   GFRNONAA >89 12/09/2016 0934   GFRAA 116 12/23/2019 1447   GFRAA >89 12/09/2016 0934    Lipid Panel     Component Value Date/Time   CHOL 117 08/12/2021 1450   CHOL 139 12/23/2019 1447   TRIG 336 (H) 08/12/2021 1450   HDL 17 (L) 08/12/2021 1450   HDL 23 (L) 12/23/2019 1447   CHOLHDL 6.9 (H) 08/12/2021 1450   VLDL 29 12/09/2016 0934   LDLCALC 60 08/12/2021 1450    CBC    Component Value Date/Time   WBC 6.5 08/12/2021 1450   RBC 4.48 08/12/2021 1450   HGB 10.9 (L) 08/12/2021 1450   HGB 14.1 11/27/2018 1344   HCT 35.3 (L) 08/12/2021 1450   HCT 41.8 11/27/2018 1344   PLT 269 08/12/2021 1450   PLT 200 11/27/2018 1344   MCV 78.8 (L) 08/12/2021 1450   MCV 83 11/27/2018 1344   MCH 24.3 (L) 08/12/2021 1450   MCHC 30.9 (L) 08/12/2021 1450   RDW 13.8 08/12/2021 1450   RDW 13.1 11/27/2018 1344   LYMPHSABS 1.9 11/27/2018 1344   MONOABS 1.4 (H) 10/26/2018 2318   EOSABS 0.5 (H) 11/27/2018 1344  BASOSABS 0.2 11/27/2018 1344    Hgb A1C Lab Results  Component Value Date    HGBA1C 5.5 08/12/2021           Assessment & Plan:   Cough secondary to PND:  Continue Loratadine Rx for Pred taper x6 days Rx for Promethazine DM  RTC in 6 months for annual exam Nicki Reaper, NP

## 2022-03-03 NOTE — Assessment & Plan Note (Signed)
Wearing prosthesis

## 2022-03-03 NOTE — Assessment & Plan Note (Signed)
Continue laxatives as needed

## 2022-03-03 NOTE — Assessment & Plan Note (Signed)
Encourage low-fat diet Lipid profile today

## 2022-03-03 NOTE — Assessment & Plan Note (Signed)
Currently not an issue 

## 2022-03-03 NOTE — Assessment & Plan Note (Signed)
Encourage diet and exercise for weight loss 

## 2022-03-04 LAB — CBC
HCT: 36.3 % — ABNORMAL LOW (ref 38.5–50.0)
Hemoglobin: 11.3 g/dL — ABNORMAL LOW (ref 13.2–17.1)
MCH: 25.8 pg — ABNORMAL LOW (ref 27.0–33.0)
MCHC: 31.1 g/dL — ABNORMAL LOW (ref 32.0–36.0)
MCV: 82.9 fL (ref 80.0–100.0)
MPV: 10.9 fL (ref 7.5–12.5)
Platelets: 229 10*3/uL (ref 140–400)
RBC: 4.38 10*6/uL (ref 4.20–5.80)
RDW: 14.5 % (ref 11.0–15.0)
WBC: 7.3 10*3/uL (ref 3.8–10.8)

## 2022-03-04 LAB — LIPID PANEL
Cholesterol: 151 mg/dL (ref <200)
HDL: 24 mg/dL — ABNORMAL LOW (ref >=40)
LDL Cholesterol (Calc): 89 mg/dL (calc)
Non-HDL Cholesterol (Calc): 127 mg/dL (calc) (ref <130)
Total CHOL/HDL Ratio: 6.3 (calc) — ABNORMAL HIGH (ref <5.0)
Triglycerides: 291 mg/dL — ABNORMAL HIGH (ref <150)

## 2022-03-04 LAB — COMPLETE METABOLIC PANEL WITH GFR
AG Ratio: 1.7 (calc) (ref 1.0–2.5)
ALT: 42 U/L (ref 9–46)
AST: 40 U/L — ABNORMAL HIGH (ref 10–35)
Albumin: 4.3 g/dL (ref 3.6–5.1)
Alkaline phosphatase (APISO): 121 U/L (ref 35–144)
BUN: 11 mg/dL (ref 7–25)
CO2: 25 mmol/L (ref 20–32)
Calcium: 8.9 mg/dL (ref 8.6–10.3)
Chloride: 102 mmol/L (ref 98–110)
Creat: 0.75 mg/dL (ref 0.70–1.30)
Globulin: 2.6 g/dL (calc) (ref 1.9–3.7)
Glucose, Bld: 156 mg/dL — ABNORMAL HIGH (ref 65–139)
Potassium: 4.4 mmol/L (ref 3.5–5.3)
Sodium: 138 mmol/L (ref 135–146)
Total Bilirubin: 0.4 mg/dL (ref 0.2–1.2)
Total Protein: 6.9 g/dL (ref 6.1–8.1)
eGFR: 108 mL/min/{1.73_m2} (ref >=60)

## 2022-03-08 MED ORDER — ATORVASTATIN CALCIUM 10 MG PO TABS: 10.0000 mg | ORAL_TABLET | Freq: Every day | ORAL | 2 refills | Status: DC

## 2022-03-08 NOTE — Addendum Note (Signed)
Addended by: Lorre Munroe on: 03/08/2022 09:42 AM   Modules accepted: Orders

## 2022-03-11 DIAGNOSIS — M5136 Other intervertebral disc degeneration, lumbar region: Secondary | ICD-10-CM | POA: Diagnosis not present

## 2022-03-11 DIAGNOSIS — M5416 Radiculopathy, lumbar region: Secondary | ICD-10-CM | POA: Diagnosis not present

## 2022-03-11 DIAGNOSIS — M5116 Intervertebral disc disorders with radiculopathy, lumbar region: Secondary | ICD-10-CM | POA: Diagnosis not present

## 2022-03-11 DIAGNOSIS — M9903 Segmental and somatic dysfunction of lumbar region: Secondary | ICD-10-CM | POA: Diagnosis not present

## 2022-03-22 ENCOUNTER — Encounter: Payer: Self-pay | Admitting: Internal Medicine

## 2022-03-22 ENCOUNTER — Ambulatory Visit (INDEPENDENT_AMBULATORY_CARE_PROVIDER_SITE_OTHER): Payer: Medicare Other | Admitting: Internal Medicine

## 2022-03-22 VITALS — BP 134/75 | HR 86 | Temp 97.8°F | Resp 18 | Wt 210.0 lb

## 2022-03-22 DIAGNOSIS — J00 Acute nasopharyngitis [common cold]: Secondary | ICD-10-CM

## 2022-03-22 MED ORDER — BENZONATATE 200 MG PO CAPS: 200.0000 mg | ORAL_CAPSULE | Freq: Three times a day (TID) | ORAL | 0 refills | Status: DC | PRN

## 2022-03-22 MED ORDER — AZITHROMYCIN 250 MG PO TABS: ORAL_TABLET | ORAL | 0 refills | Status: AC

## 2022-03-22 NOTE — Patient Instructions (Signed)

## 2022-03-22 NOTE — Progress Notes (Signed)
HPI  Pt presents to the clinic today with c/o headache, facial pain and pressure, nasal congestion, ear fullness, cough and shortness of breath.  He reports this started 1 week ago.  The headache is located in his forehead and cheeks.  He describes the pain as pressure.  He is blowing yellow mucus out of his nose.  He denies ear drainage or loss of hearing.  The cough is productive of brown mucus at times.  He denies runny nose, sore throat or chest tightness.  He denies fever, chills or body aches.  He has tried DayQuil OTC with minimal relief of symptoms.  He has had sick contacts with similar symptoms.  Review of Systems      Past Medical History:  Diagnosis Date   Allergy    Anemia 03/03/2022   Anxiety 01/21/2020   Controlled substance agreement signed 04/02/2017   GERD (gastroesophageal reflux disease)    Hypertension    Neutropenia, congenital (HCC)    Phantom limb pain (HCC)     Family History  Problem Relation Age of Onset   Heart Problems Mother    Asthma Mother    Depression Mother    Thyroid disease Mother    Alzheimer's disease Mother    Hypertension Mother    Stroke Maternal Grandmother    Cancer Maternal Grandmother    Cancer Father        skin   Hypertension Father    Cancer Paternal Grandmother    Cancer Paternal Grandfather     Social History   Socioeconomic History   Marital status: Married    Spouse name: Geovannie Vilar   Number of children: 2   Years of education: Not on file   Highest education level: Associate degree: academic program  Occupational History   Occupation: Disabled  Tobacco Use   Smoking status: Never   Smokeless tobacco: Never  Vaping Use   Vaping Use: Never used  Substance and Sexual Activity   Alcohol use: Never    Alcohol/week: 0.0 standard drinks of alcohol   Drug use: Never   Sexual activity: Never  Other Topics Concern   Not on file  Social History Narrative   Not on file   Social Determinants of Health   Financial  Resource Strain: Low Risk  (02/28/2018)   Overall Financial Resource Strain (CARDIA)    Difficulty of Paying Living Expenses: Not very hard  Food Insecurity: No Food Insecurity (10/21/2021)   Hunger Vital Sign    Worried About Running Out of Food in the Last Year: Never true    Ran Out of Food in the Last Year: Never true  Transportation Needs: No Transportation Needs (10/21/2021)   PRAPARE - Administrator, Civil Service (Medical): No    Lack of Transportation (Non-Medical): No  Physical Activity: Inactive (10/21/2021)   Exercise Vital Sign    Days of Exercise per Week: 0 days    Minutes of Exercise per Session: 0 min  Stress: No Stress Concern Present (10/21/2021)   [employer] of Occupational Health - Occupational Stress Questionnaire    Feeling of Stress : Not at all  Social Connections: Socially Integrated (10/21/2021)   Social Connection and Isolation Panel [NHANES]    Frequency of Communication with Friends and Family: Once a week    Frequency of Social Gatherings with Friends and Family: More than three times a week    Attends Religious Services: More than 4 times per year    Active Member  of Clubs or Organizations: Yes    Attends Banker Meetings: More than 4 times per year    Marital Status: Married  Catering manager Violence: Not At Risk (10/21/2021)   Humiliation, Afraid, Rape, and Kick questionnaire    Fear of Current or Ex-Partner: No    Emotionally Abused: No    Physically Abused: No    Sexually Abused: No    Allergies  Allergen Reactions   Enalapril Cough    ACEi Cough   Hydrochlorothiazide     Sweats, hot flashes   Prochlorperazine     Other reaction(s): Other (See Comments) As a child unable to recall   Sulfa Antibiotics Other (See Comments)    Other reaction(s): Other (See Comments) As a child unable to recall   Benadryl [Diphenhydramine]     Increased phantom pain   Strawberry Extract Rash     Constitutional: Positive  headache. Denies fatigue, fever or abrupt weight changes.  HEENT:  Positive nasal congestion, ear fullness and sore throat. Denies eye redness, eye pain, pressure behind the eyes, facial pain, ear pain, ringing in the ears, wax buildup, runny nose or bloody nose. Respiratory: Positive cough and shortness of breath. Denies difficulty breathing.  Cardiovascular: Denies chest pain, chest tightness, palpitations or swelling in the hands or feet.   No other specific complaints in a complete review of systems (except as listed in HPI above).  Objective:   BP 134/75   Pulse 86   Temp 97.8 F (36.6 C)   Resp 18   Wt 210 lb (95.3 kg)   SpO2 98%   BMI 31.93 kg/m   Wt Readings from Last 3 Encounters:  03/03/22 211 lb (95.7 kg)  10/11/21 208 lb (94.3 kg)  08/25/21 206 lb (93.4 kg)     General: Appears his stated age, obese, appears unwell but in NAD. HEENT: Head: normal shape and size, maxillary sinus tenderness noted; Eyes: sclera white, no icterus, conjunctiva pink; Ears: Tm's gray and intact, normal light reflex;  Throat/Mouth: + PND. Teeth present, mucosa erythematous and moist, no exudate noted, no lesions or ulcerations noted.  Neck: No cervical lymphadenopathy.  Cardiovascular: Normal rate and rhythm. S1,S2 noted.  No murmur, rubs or gallops noted.  Pulmonary/Chest: Normal effort and positive vesicular breath sounds. No respiratory distress. No wheezes, rales or ronchi noted.       Assessment & Plan:   Upper Respiratory Infection:  Get some rest and drink plenty of water Rx for Azithromax 1250 mg x 5 days Rx for Tessalon 200 mg 3 times daily as needed  RTC as needed or if symptoms persist.   Nicki Reaper, NP

## 2022-03-23 ENCOUNTER — Ambulatory Visit: Payer: Medicare Other | Admitting: Physician Assistant

## 2022-03-30 DIAGNOSIS — D700 Congenital agranulocytosis: Secondary | ICD-10-CM | POA: Diagnosis not present

## 2022-04-12 DIAGNOSIS — M9903 Segmental and somatic dysfunction of lumbar region: Secondary | ICD-10-CM | POA: Diagnosis not present

## 2022-04-12 DIAGNOSIS — M5136 Other intervertebral disc degeneration, lumbar region: Secondary | ICD-10-CM | POA: Diagnosis not present

## 2022-04-12 DIAGNOSIS — M5416 Radiculopathy, lumbar region: Secondary | ICD-10-CM | POA: Diagnosis not present

## 2022-04-12 DIAGNOSIS — M5116 Intervertebral disc disorders with radiculopathy, lumbar region: Secondary | ICD-10-CM | POA: Diagnosis not present

## 2022-05-10 DIAGNOSIS — M9903 Segmental and somatic dysfunction of lumbar region: Secondary | ICD-10-CM | POA: Diagnosis not present

## 2022-05-10 DIAGNOSIS — M5136 Other intervertebral disc degeneration, lumbar region: Secondary | ICD-10-CM | POA: Diagnosis not present

## 2022-05-10 DIAGNOSIS — M5116 Intervertebral disc disorders with radiculopathy, lumbar region: Secondary | ICD-10-CM | POA: Diagnosis not present

## 2022-05-10 DIAGNOSIS — M5416 Radiculopathy, lumbar region: Secondary | ICD-10-CM | POA: Diagnosis not present

## 2022-05-19 DIAGNOSIS — M545 Low back pain, unspecified: Secondary | ICD-10-CM | POA: Diagnosis not present

## 2022-06-02 DIAGNOSIS — Z89611 Acquired absence of right leg above knee: Secondary | ICD-10-CM | POA: Diagnosis not present

## 2022-06-06 ENCOUNTER — Other Ambulatory Visit: Payer: Medicare Other

## 2022-06-06 ENCOUNTER — Other Ambulatory Visit: Payer: Self-pay

## 2022-06-06 DIAGNOSIS — E781 Pure hyperglyceridemia: Secondary | ICD-10-CM | POA: Diagnosis not present

## 2022-06-06 LAB — COMPLETE METABOLIC PANEL WITH GFR
AG Ratio: 1.8 (calc) (ref 1.0–2.5)
ALT: 54 U/L — ABNORMAL HIGH (ref 9–46)
AST: 54 U/L — ABNORMAL HIGH (ref 10–35)
Albumin: 4.7 g/dL (ref 3.6–5.1)
Alkaline phosphatase (APISO): 118 U/L (ref 35–144)
BUN: 16 mg/dL (ref 7–25)
CO2: 24 mmol/L (ref 20–32)
Calcium: 9.8 mg/dL (ref 8.6–10.3)
Chloride: 105 mmol/L (ref 98–110)
Creat: 0.73 mg/dL (ref 0.70–1.30)
Globulin: 2.6 g/dL (calc) (ref 1.9–3.7)
Glucose, Bld: 96 mg/dL (ref 65–99)
Potassium: 4.3 mmol/L (ref 3.5–5.3)
Sodium: 139 mmol/L (ref 135–146)
Total Bilirubin: 0.5 mg/dL (ref 0.2–1.2)
Total Protein: 7.3 g/dL (ref 6.1–8.1)
eGFR: 108 mL/min/{1.73_m2} (ref >=60)

## 2022-06-06 LAB — LIPID PANEL
Cholesterol: 101 mg/dL (ref <200)
HDL: 23 mg/dL — ABNORMAL LOW (ref >=40)
LDL Cholesterol (Calc): 51 mg/dL (calc)
Non-HDL Cholesterol (Calc): 78 mg/dL (calc) (ref <130)
Total CHOL/HDL Ratio: 4.4 (calc) (ref <5.0)
Triglycerides: 203 mg/dL — ABNORMAL HIGH (ref <150)

## 2022-06-08 ENCOUNTER — Other Ambulatory Visit: Payer: Self-pay | Admitting: Internal Medicine

## 2022-06-09 ENCOUNTER — Other Ambulatory Visit: Payer: Medicare Other

## 2022-06-13 ENCOUNTER — Encounter: Payer: Self-pay | Admitting: Internal Medicine

## 2022-06-13 ENCOUNTER — Ambulatory Visit (INDEPENDENT_AMBULATORY_CARE_PROVIDER_SITE_OTHER): Payer: Medicare Other | Admitting: Internal Medicine

## 2022-06-13 VITALS — BP 134/82 | HR 73 | Temp 96.5°F | Wt 207.0 lb

## 2022-06-13 DIAGNOSIS — J069 Acute upper respiratory infection, unspecified: Secondary | ICD-10-CM | POA: Diagnosis not present

## 2022-06-13 DIAGNOSIS — H60391 Other infective otitis externa, right ear: Secondary | ICD-10-CM

## 2022-06-13 LAB — POCT INFLUENZA A/B
Influenza A, POC: NEGATIVE
Influenza B, POC: NEGATIVE

## 2022-06-13 LAB — POC COVID19 BINAXNOW: SARS Coronavirus 2 Ag: NEGATIVE

## 2022-06-13 MED ORDER — PREDNISONE 10 MG PO TABS: ORAL_TABLET | ORAL | 0 refills | Status: DC

## 2022-06-13 MED ORDER — CIPROFLOXACIN-DEXAMETHASONE 0.3-0.1 % OT SUSP: 4.0000 [drp] | Freq: Two times a day (BID) | OTIC | 0 refills | Status: DC

## 2022-06-13 NOTE — Patient Instructions (Signed)
Otitis Externa  Otitis externa is an infection of the outer ear canal. The outer ear canal is the area between the outside of the ear and the eardrum. Otitis externa is sometimes called swimmer's ear. What are the causes? Common causes of this condition include: Swimming in dirty water. Moisture in the ear. An injury to the inside of the ear. An object stuck in the ear. A cut or scrape on the outside of the ear or in the ear canal. What increases the risk? You are more likely to get this condition if you go swimming often. What are the signs or symptoms? Itching in the ear. This is often the first symptom. Swelling of the ear. Redness in the ear. Ear pain. The pain may get worse when you pull on your ear. Pus coming from the ear. How is this treated? This condition may be treated with: Antibiotic ear drops. These are often given for 10-14 days. Medicines to reduce itching and swelling. Follow these instructions at home: If you were prescribed antibiotic ear drops, use them as told by your doctor. Do not stop using them even if you start to feel better. Take over-the-counter and prescription medicines only as told by your doctor. Avoid getting water in your ears as told by your doctor. You may be told to avoid swimming or water sports for a few days. Keep all follow-up visits. How is this prevented? Keep your ears dry. Use the corner of a towel to dry your ears after you swim or bathe. Try not to scratch or put things in your ear. Doing these things makes it easier for germs to grow in your ear. Avoid swimming in lakes, dirty water, or swimming pools that may not have the right amount of a chemical called chlorine. Contact a doctor if: You have a fever. Your ear is still red, swollen, or painful after 3 days. You still have pus coming from your ear after 3 days. Your redness, swelling, or pain gets worse. You have a very bad headache. Get help right away if: You have redness,  swelling, and pain or tenderness behind your ear. Summary Otitis externa is an infection of the outer ear canal. Symptoms include pain, redness, and swelling of the ear. If you were prescribed antibiotic ear drops, use them as told by your doctor. Do not stop using them even if you start to feel better. Try not to scratch or put things in your ear. This information is not intended to replace advice given to you by your health care provider. Make sure you discuss any questions you have with your health care provider. Document Revised: 09/30/2020 Document Reviewed: 09/30/2020 Elsevier Patient Education  2023 Elsevier Inc.  

## 2022-06-13 NOTE — Progress Notes (Signed)
HPI  Pt presents to the clinic today with c/o fever, nasal congestion, ear fullness, and cough. This started 5 days ago. He is blowing yellow mucous out of his nose. The cough is productive of brown mucous. He denies headache, runny nose, sore throat, shortness of breath, nausea, vomiting or diarrhea. He reports subjective fever along with chills and body aches. He has tried Ibuprofen, Tylenol and Dayquil with minimal relief of symptoms. He has had sick contacts with similar symptoms. He had a negative home covid test.  Review of Systems      Past Medical History:  Diagnosis Date   Allergy    Anemia 03/03/2022   Anxiety 01/21/2020   Controlled substance agreement signed 04/02/2017   GERD (gastroesophageal reflux disease)    Hypertension    Neutropenia, congenital (HCC)    Phantom limb pain (HCC)     Family History  Problem Relation Age of Onset   Heart Problems Mother    Asthma Mother    Depression Mother    Thyroid disease Mother    Alzheimer's disease Mother    Hypertension Mother    Stroke Maternal Grandmother    Cancer Maternal Grandmother    Cancer Father        skin   Hypertension Father    Cancer Paternal Grandmother    Cancer Paternal Grandfather     Social History   Socioeconomic History   Marital status: Married    Spouse name: Leonides Minder   Number of children: 2   Years of education: Not on file   Highest education level: Associate degree: academic program  Occupational History   Occupation: Disabled  Tobacco Use   Smoking status: Never   Smokeless tobacco: Never  Vaping Use   Vaping Use: Never used  Substance and Sexual Activity   Alcohol use: Never    Alcohol/week: 0.0 standard drinks of alcohol   Drug use: Never   Sexual activity: Never  Other Topics Concern   Not on file  Social History Narrative   Not on file   Social Determinants of Health   Financial Resource Strain: Low Risk  (02/28/2018)   Overall Financial Resource Strain (CARDIA)     Difficulty of Paying Living Expenses: Not very hard  Food Insecurity: No Food Insecurity (10/21/2021)   Hunger Vital Sign    Worried About Running Out of Food in the Last Year: Never true    Ran Out of Food in the Last Year: Never true  Transportation Needs: No Transportation Needs (10/21/2021)   PRAPARE - Administrator, Civil Service (Medical): No    Lack of Transportation (Non-Medical): No  Physical Activity: Inactive (10/21/2021)   Exercise Vital Sign    Days of Exercise per Week: 0 days    Minutes of Exercise per Session: 0 min  Stress: No Stress Concern Present (10/21/2021)   [employer] of Occupational Health - Occupational Stress Questionnaire    Feeling of Stress : Not at all  Social Connections: Socially Integrated (10/21/2021)   Social Connection and Isolation Panel [NHANES]    Frequency of Communication with Friends and Family: Once a week    Frequency of Social Gatherings with Friends and Family: More than three times a week    Attends Religious Services: More than 4 times per year    Active Member of [employer] or Organizations: Yes    Attends Engineer, structural: More than 4 times per year    Marital Status: Married  Intimate  Partner Violence: Not At Risk (10/21/2021)   Humiliation, Afraid, Rape, and Kick questionnaire    Fear of Current or Ex-Partner: No    Emotionally Abused: No    Physically Abused: No    Sexually Abused: No    Allergies  Allergen Reactions   Enalapril Cough    ACEi Cough   Hydrochlorothiazide     Sweats, hot flashes   Prochlorperazine     Other reaction(s): Other (See Comments) As a child unable to recall   Sulfa Antibiotics Other (See Comments)    Other reaction(s): Other (See Comments) As a child unable to recall   Benadryl [Diphenhydramine]     Increased phantom pain   Strawberry Extract Rash     Constitutional: Positive fever. Denies headache, fatigue,  abrupt weight changes.  HEENT:  Positive nasal congestion,  ear fullness. Denies eye redness, eye pain, pressure behind the eyes, facial pain, ear pain, ringing in the ears, wax buildup, runny nose or sore throat. Respiratory: Positive cough. Denies difficulty breathing or shortness of breath.  Cardiovascular: Denies chest pain, chest tightness, palpitations or swelling in the hands or feet.   No other specific complaints in a complete review of systems (except as listed in HPI above).  Objective:   BP 134/82 (BP Location: Left Arm, Patient Position: Sitting, Cuff Size: Normal)   Pulse 73   Temp (!) 96.5 F (35.8 C) (Temporal)   Wt 207 lb (93.9 kg)   SpO2 99%   BMI 31.47 kg/m   Wt Readings from Last 3 Encounters:  03/22/22 210 lb (95.3 kg)  03/03/22 211 lb (95.7 kg)  10/11/21 208 lb (94.3 kg)     General: Appears his stated age, obese, in NAD. HEENT: Head: normal shape and size, no sinus tenderness noted; Eyes: sclera white, no icterus, conjunctiva pink; Right Ears: canal erythematous with white discharge noted; Throat/Mouth: + PND. Teeth present, mucosa erythematous and moist, no exudate noted, no lesions or ulcerations noted.  Neck: No cervical lymphadenopathy.  Cardiovascular: Normal rate and rhythm. S1,S2 noted.  No murmur, rubs or gallops noted.  Pulmonary/Chest: Normal effort and positive vesicular breath sounds. No respiratory distress. No wheezes, rales or ronchi noted.       Assessment & Plan:   Viral Upper Respiratory Infection with Cough:  Covid negative Influenza negative Get some rest and drink plenty of water RX for Pred Taper for symptom management No indication for abx at this time  Right Otitis Externa:  RX for Ciprodex BID x7 days  RTC in 3 months for your annual exam   Nicki Reaper, NP

## 2022-06-14 DIAGNOSIS — R059 Cough, unspecified: Secondary | ICD-10-CM | POA: Diagnosis not present

## 2022-06-14 DIAGNOSIS — Z20822 Contact with and (suspected) exposure to covid-19: Secondary | ICD-10-CM | POA: Diagnosis not present

## 2022-06-14 DIAGNOSIS — H66001 Acute suppurative otitis media without spontaneous rupture of ear drum, right ear: Secondary | ICD-10-CM | POA: Diagnosis not present

## 2022-06-17 ENCOUNTER — Encounter: Payer: Self-pay | Admitting: Internal Medicine

## 2022-06-17 MED ORDER — AMOXICILLIN 875 MG PO TABS: 875.0000 mg | ORAL_TABLET | Freq: Two times a day (BID) | ORAL | 0 refills | Status: AC

## 2022-06-21 ENCOUNTER — Encounter: Payer: Self-pay | Admitting: Physical Medicine & Rehabilitation

## 2022-06-21 ENCOUNTER — Other Ambulatory Visit: Payer: Self-pay | Admitting: Physical Medicine & Rehabilitation

## 2022-06-21 DIAGNOSIS — G8929 Other chronic pain: Secondary | ICD-10-CM | POA: Diagnosis not present

## 2022-06-21 DIAGNOSIS — M5441 Lumbago with sciatica, right side: Secondary | ICD-10-CM | POA: Diagnosis not present

## 2022-07-04 ENCOUNTER — Ambulatory Visit
Admission: RE | Admit: 2022-07-04 | Discharge: 2022-07-04 | Disposition: A | Discharge status: Home / Self Care | Payer: Medicare Other | Source: Ambulatory Visit | Attending: Physical Medicine & Rehabilitation | Admitting: Physical Medicine & Rehabilitation

## 2022-07-04 DIAGNOSIS — M47816 Spondylosis without myelopathy or radiculopathy, lumbar region: Secondary | ICD-10-CM | POA: Diagnosis not present

## 2022-07-04 DIAGNOSIS — M5126 Other intervertebral disc displacement, lumbar region: Secondary | ICD-10-CM | POA: Diagnosis not present

## 2022-07-04 DIAGNOSIS — M5441 Lumbago with sciatica, right side: Secondary | ICD-10-CM | POA: Insufficient documentation

## 2022-07-04 DIAGNOSIS — G8929 Other chronic pain: Secondary | ICD-10-CM | POA: Insufficient documentation

## 2022-07-12 DIAGNOSIS — M5441 Lumbago with sciatica, right side: Secondary | ICD-10-CM | POA: Diagnosis not present

## 2022-07-12 DIAGNOSIS — G6289 Other specified polyneuropathies: Secondary | ICD-10-CM | POA: Diagnosis not present

## 2022-07-12 DIAGNOSIS — M48062 Spinal stenosis, lumbar region with neurogenic claudication: Secondary | ICD-10-CM | POA: Diagnosis not present

## 2022-07-12 DIAGNOSIS — G8929 Other chronic pain: Secondary | ICD-10-CM | POA: Diagnosis not present

## 2022-08-11 ENCOUNTER — Other Ambulatory Visit: Payer: Self-pay | Admitting: Internal Medicine

## 2022-08-11 DIAGNOSIS — I10 Essential (primary) hypertension: Secondary | ICD-10-CM

## 2022-08-11 NOTE — Telephone Encounter (Signed)
Requested Prescriptions  Pending Prescriptions Disp Refills   losartan (COZAAR) 100 MG tablet [Pharmacy Med Name: LOSARTAN 100MG  TABLETS] 90 tablet 1    Sig: TAKE 1 TABLET(100 MG) BY MOUTH DAILY     Cardiovascular:  Angiotensin Receptor Blockers Passed - 08/11/2022  2:18 PM      Passed - Cr in normal range and within 180 days    Creat  Date Value Ref Range Status  06/06/2022 0.73 0.70 - 1.30 mg/dL Final         Passed - K in normal range and within 180 days    Potassium  Date Value Ref Range Status  06/06/2022 4.3 3.5 - 5.3 mmol/L Final         Passed - Patient is not pregnant      Passed - Last BP in normal range    BP Readings from Last 1 Encounters:  06/13/22 134/82         Passed - Valid encounter within last 6 months    Recent Outpatient Visits           1 month ago Other infective acute otitis externa of right ear   [facility] [place], Coralie Keens, NP   4 months ago Acute nasopharyngitis   [facility] [place], Coralie Keens, NP   5 months ago Pure hypertriglyceridemia   [facility] [place], Coralie Keens, NP   10 months ago Acute nasopharyngitis   [facility], Coralie Keens, NP   11 months ago Acute non-recurrent frontal sinusitis   [facility] [place], Devonne Doughty, DO       Future Appointments             In 4 days [place], Coralie Keens, NP [facility], [facility]

## 2022-08-14 ENCOUNTER — Encounter: Payer: Self-pay | Admitting: Internal Medicine

## 2022-08-15 ENCOUNTER — Encounter: Payer: Medicare Other | Admitting: Internal Medicine

## 2022-08-25 ENCOUNTER — Other Ambulatory Visit: Payer: Self-pay | Admitting: Internal Medicine

## 2022-08-25 DIAGNOSIS — G546 Phantom limb syndrome with pain: Secondary | ICD-10-CM

## 2022-08-25 NOTE — Telephone Encounter (Signed)
Requested Prescriptions  Pending Prescriptions Disp Refills   gabapentin (NEURONTIN) 300 MG capsule [Pharmacy Med Name: GABAPENTIN 300MG  CAPSULES] 90 capsule 1    Sig: TAKE 1 CAPSULE(300 MG) BY MOUTH DAILY     Neurology: Anticonvulsants - gabapentin Passed - 08/25/2022 12:24 PM      Passed - Cr in normal range and within 360 days    Creat  Date Value Ref Range Status  06/06/2022 0.73 0.70 - 1.30 mg/dL Final         Passed - Completed PHQ-2 or PHQ-9 in the last 360 days      Passed - Valid encounter within last 12 months    Recent Outpatient Visits           2 months ago Other infective acute otitis externa of right ear   [facility] [place], Coralie Keens, NP   5 months ago Acute nasopharyngitis   [facility] [place], Coralie Keens, NP   5 months ago Pure hypertriglyceridemia   [facility] [place], Coralie Keens, NP   10 months ago Acute nasopharyngitis   [facility] [place], [place], NP   12 months ago Acute non-recurrent frontal sinusitis   [place], DO               tiZANidine (ZANAFLEX) 4 MG tablet [Pharmacy Med Name: TIZANIDINE 4MG  TABLETS] 90 tablet 1    Sig: TAKE 1 TABLET(4 MG) BY MOUTH AT BEDTIME AS NEEDED FOR MUSCLE SPASMS     Not Delegated - Cardiovascular:  Alpha-2 Agonists - tizanidine Failed - 08/25/2022 12:24 PM      Failed - This refill cannot be delegated      Passed - Valid encounter within last 6 months    Recent Outpatient Visits           2 months ago Other infective acute otitis externa of right ear   [facility] [place], Coralie Keens, NP   5 months ago Acute nasopharyngitis   [facility] [place], Coralie Keens, NP   5 months ago Pure hypertriglyceridemia   [facility] [place], Coralie Keens, NP   10 months ago Acute nasopharyngitis    [facility] [place], Coralie Keens, NP   12 months ago Acute non-recurrent frontal sinusitis   [place], Devonne Doughty, [place]

## 2022-08-25 NOTE — Telephone Encounter (Signed)
Requested medication (s) are due for refill today: yes  Requested medication (s) are on the active medication list: yes  Last refill:  03/03/22 #90/1  Future visit scheduled: no  Notes to clinic:  Unable to refill per protocol, cannot delegate.    Requested Prescriptions  Pending Prescriptions Disp Refills   tiZANidine (ZANAFLEX) 4 MG tablet [Pharmacy Med Name: TIZANIDINE 4MG  TABLETS] 90 tablet 1    Sig: TAKE 1 TABLET(4 MG) BY MOUTH AT BEDTIME AS NEEDED FOR MUSCLE SPASMS     Not Delegated - Cardiovascular:  Alpha-2 Agonists - tizanidine Failed - 08/25/2022 12:24 PM      Failed - This refill cannot be delegated      Passed - Valid encounter within last 6 months    Recent Outpatient Visits           2 months ago Other infective acute otitis externa of right ear   [facility] [place], Coralie Keens, NP   5 months ago Acute nasopharyngitis   [facility] [place], Coralie Keens, NP   5 months ago Pure hypertriglyceridemia   [facility] [place], Coralie Keens, NP   10 months ago Acute nasopharyngitis   [facility] [place], [place], NP   12 months ago Acute non-recurrent frontal sinusitis   [facility] [place], Devonne Doughty, DO              Signed Prescriptions Disp Refills   gabapentin (NEURONTIN) 300 MG capsule 90 capsule 0    Sig: TAKE 1 CAPSULE(300 MG) BY MOUTH DAILY     Neurology: Anticonvulsants - gabapentin Passed - 08/25/2022 12:24 PM      Passed - Cr in normal range and within 360 days    Creat  Date Value Ref Range Status  06/06/2022 0.73 0.70 - 1.30 mg/dL Final         Passed - Completed PHQ-2 or PHQ-9 in the last 360 days      Passed - Valid encounter within last 12 months    Recent Outpatient Visits           2 months ago Other infective acute otitis externa of right ear   [facility] [place], Coralie Keens, NP   5  months ago Acute nasopharyngitis   [facility] [place], Coralie Keens, NP   5 months ago Pure hypertriglyceridemia   [facility] [place], Coralie Keens, NP   10 months ago Acute nasopharyngitis   [facility] [place], Coralie Keens, NP   12 months ago Acute non-recurrent frontal sinusitis   [place], Devonne Doughty, [place]

## 2022-08-30 ENCOUNTER — Other Ambulatory Visit: Payer: Self-pay | Admitting: Internal Medicine

## 2022-08-31 NOTE — Telephone Encounter (Signed)
Requested Prescriptions  Pending Prescriptions Disp Refills   atorvastatin (LIPITOR) 10 MG tablet [Pharmacy Med Name: ATORVASTATIN 10MG  TABLETS] 30 tablet 2    Sig: TAKE 1 TABLET(10 MG) BY MOUTH DAILY     Cardiovascular:  Antilipid - Statins Failed - 08/30/2022 11:35 PM      Failed - Lipid Panel in normal range within the last 12 months    Cholesterol, Total  Date Value Ref Range Status  12/23/2019 139 100 - 199 mg/dL Final   Cholesterol  Date Value Ref Range Status  06/06/2022 101 <200 mg/dL Final   LDL Cholesterol (Calc)  Date Value Ref Range Status  06/06/2022 51 mg/dL (calc) Final    Comment:    Reference range: <100 . Desirable range <100 mg/dL for primary prevention;   <70 mg/dL for patients with CHD or diabetic patients  with > or = 2 CHD risk factors. Marland Kitchen LDL-C is now calculated using the Martin-Hopkins  calculation, which is a validated novel method providing  better accuracy than the Friedewald equation in the  estimation of LDL-C.  Cresenciano Genre et al. Annamaria Helling. 0092;330(07): 2061-2068  (http://education.[facility].com/faq/FAQ164)    HDL  Date Value Ref Range Status  06/06/2022 23 (L) > OR = 40 mg/dL Final  12/23/2019 23 (L) >39 mg/dL Final   Triglycerides  Date Value Ref Range Status  06/06/2022 203 (H) <150 mg/dL Final    Comment:    . If a non-fasting specimen was collected, consider repeat triglyceride testing on a fasting specimen if clinically indicated.  Yates Decamp et al. J. of Clin. Lipidol. 6226;3:335-456. Marland Kitchen          Passed - Patient is not pregnant      Passed - Valid encounter within last 12 months    Recent Outpatient Visits           2 months ago Other infective acute otitis externa of right ear   [facility] [place], Coralie Keens, NP   5 months ago Acute nasopharyngitis   [facility] [place], Coralie Keens, NP   6 months ago Pure hypertriglyceridemia   [facility]  [place], Coralie Keens, NP   10 months ago Acute nasopharyngitis   [facility] [place], Coralie Keens, NP   1 year ago Acute non-recurrent frontal sinusitis   [facility], Devonne Doughty, [place]

## 2022-10-14 ENCOUNTER — Telehealth: Payer: Self-pay | Admitting: Internal Medicine

## 2022-10-14 NOTE — Telephone Encounter (Signed)
Called patient to schedule Medicare Annual Wellness Visit (AWV). Left message for patient to call back and schedule Medicare Annual Wellness Visit (AWV).  Last date of AWV: 10/21/21  Please schedule an appointment at any time with Kirke Shaggy, LPN .  If any questions, please contact me.  Thank you , Sherol Dade; [place] Direct Dial: [phone]

## 2022-10-19 ENCOUNTER — Other Ambulatory Visit: Payer: Self-pay | Admitting: Internal Medicine

## 2022-10-20 NOTE — Telephone Encounter (Signed)
Requested Prescriptions  Pending Prescriptions Disp Refills   metoprolol succinate (TOPROL-XL) 100 MG 24 hr tablet [Pharmacy Med Name: METOPROLOL ER SUCCINATE 100MG  TABS] 90 tablet 0    Sig: TAKE 1 TABLET BY MOUTH DAILY FOLLOWING A MEAL     Cardiovascular:  Beta Blockers Passed - 10/19/2022 10:30 AM      Passed - Last BP in normal range    BP Readings from Last 1 Encounters:  06/13/22 134/82         Passed - Last Heart Rate in normal range    Pulse Readings from Last 1 Encounters:  06/13/22 73         Passed - Valid encounter within last 6 months    Recent Outpatient Visits           4 months ago Other infective acute otitis externa of right ear   [facility] [place], Coralie Keens, NP   7 months ago Acute nasopharyngitis   [facility] [place], Coralie Keens, NP   7 months ago Pure hypertriglyceridemia   [facility] [place], Coralie Keens, NP   1 year ago Acute nasopharyngitis   [facility] [place], Coralie Keens, NP   1 year ago Acute non-recurrent frontal sinusitis   [facility] [place], Devonne Doughty, [place]

## 2022-11-02 ENCOUNTER — Other Ambulatory Visit: Payer: Self-pay | Admitting: Internal Medicine

## 2022-11-02 DIAGNOSIS — K219 Gastro-esophageal reflux disease without esophagitis: Secondary | ICD-10-CM

## 2022-11-02 DIAGNOSIS — G546 Phantom limb syndrome with pain: Secondary | ICD-10-CM

## 2022-11-02 NOTE — Telephone Encounter (Signed)
Requested medication (s) are due for refill today: yes  Requested medication (s) are on the active medication list: yes  Last refill:  03/03/22 #270 1 refills  Future visit scheduled: no  Notes to clinic:  protocol failed. Do you want to refill Rx?     Requested Prescriptions  Pending Prescriptions Disp Refills   gabapentin (NEURONTIN) 800 MG tablet [Pharmacy Med Name: GABAPENTIN 800MG  TABLETS] 270 tablet 1    Sig: TAKE 1 TABLET(800 MG) BY MOUTH THREE TIMES DAILY     Neurology: Anticonvulsants - gabapentin Failed - 11/02/2022  1:19 PM      Failed - Completed PHQ-2 or PHQ-9 in the last 360 days      Passed - Cr in normal range and within 360 days    Creat  Date Value Ref Range Status  06/06/2022 0.73 0.70 - 1.30 mg/dL Final         Passed - Valid encounter within last 12 months    Recent Outpatient Visits           4 months ago Other infective acute otitis externa of right ear   [facility] [place], Coralie Keens, NP   7 months ago Acute nasopharyngitis   [facility] [place], Coralie Keens, NP   8 months ago Pure hypertriglyceridemia   [facility] [place], Coralie Keens, NP   1 year ago Acute nasopharyngitis   [facility] [place], Coralie Keens, NP   1 year ago Acute non-recurrent frontal sinusitis   [facility] [place], Devonne Doughty, DO              Signed Prescriptions Disp Refills   omeprazole (PRILOSEC) 40 MG capsule 90 capsule 1    Sig: TAKE 1 CAPSULE(40 MG) BY MOUTH DAILY     Gastroenterology: Proton Pump Inhibitors Passed - 11/02/2022  1:19 PM      Passed - Valid encounter within last 12 months    Recent Outpatient Visits           4 months ago Other infective acute otitis externa of right ear   [facility] [place], Coralie Keens, NP   7 months ago Acute nasopharyngitis   [facility] [place],  Coralie Keens, NP   8 months ago Pure hypertriglyceridemia   [facility] [place], Coralie Keens, NP   1 year ago Acute nasopharyngitis   [facility] [place], Coralie Keens, NP   1 year ago Acute non-recurrent frontal sinusitis   [facility] [place], Devonne Doughty, [place]

## 2022-11-02 NOTE — Telephone Encounter (Signed)
Requested Prescriptions  Pending Prescriptions Disp Refills   omeprazole (PRILOSEC) 40 MG capsule [Pharmacy Med Name: OMEPRAZOLE 40MG  CAPSULES] 90 capsule 1    Sig: TAKE 1 CAPSULE(40 MG) BY MOUTH DAILY     Gastroenterology: Proton Pump Inhibitors Passed - 11/02/2022  1:19 PM      Passed - Valid encounter within last 12 months    Recent Outpatient Visits           4 months ago Other infective acute otitis externa of right ear   [facility] [place], Coralie Keens, NP   7 months ago Acute nasopharyngitis   [facility] [place], Coralie Keens, NP   8 months ago Pure hypertriglyceridemia   [facility] [place], Coralie Keens, NP   1 year ago Acute nasopharyngitis   [facility] [place], Coralie Keens, NP   1 year ago Acute non-recurrent frontal sinusitis   [facility] [place], Devonne Doughty, DO               gabapentin (NEURONTIN) 800 MG tablet [Pharmacy Med Name: GABAPENTIN 800MG  TABLETS] 270 tablet 1    Sig: TAKE 1 TABLET(800 MG) BY MOUTH THREE TIMES DAILY     Neurology: Anticonvulsants - gabapentin Failed - 11/02/2022  1:19 PM      Failed - Completed PHQ-2 or PHQ-9 in the last 360 days      Passed - Cr in normal range and within 360 days    Creat  Date Value Ref Range Status  06/06/2022 0.73 0.70 - 1.30 mg/dL Final         Passed - Valid encounter within last 12 months    Recent Outpatient Visits           4 months ago Other infective acute otitis externa of right ear   [facility] [place], Coralie Keens, NP   7 months ago Acute nasopharyngitis   [facility] [place], Coralie Keens, NP   8 months ago Pure hypertriglyceridemia   [facility] [place], Coralie Keens, NP   1 year ago Acute nasopharyngitis   [facility] [place], Coralie Keens, NP   1 year ago Acute non-recurrent  frontal sinusitis   [facility] [place], Devonne Doughty, [place]

## 2022-11-04 ENCOUNTER — Other Ambulatory Visit: Payer: Self-pay | Admitting: Internal Medicine

## 2022-11-04 DIAGNOSIS — G546 Phantom limb syndrome with pain: Secondary | ICD-10-CM

## 2022-11-04 NOTE — Telephone Encounter (Signed)
Patient called, left VM to return the call to the office to schedule a physical. Noted last OV 06/13/22 to return: RTC in 3 months for your annual exam   Sent a MyChart message as well.

## 2022-11-04 NOTE — Telephone Encounter (Signed)
Requested medication (s) are due for refill today: Yes  Requested medication (s) are on the active medication list: Yes  Last refill:  03/03/22  Future visit scheduled: No  Notes to clinic:  Unable to refill per protocol, appointment needed (last refused by PCP due to need appt)     Requested Prescriptions  Pending Prescriptions Disp Refills   gabapentin (NEURONTIN) 800 MG tablet [Pharmacy Med Name: GABAPENTIN 800MG  TABLETS] 270 tablet 1    Sig: TAKE 1 TABLET(800 MG) BY MOUTH THREE TIMES DAILY     Neurology: Anticonvulsants - gabapentin Failed - 11/04/2022  9:57 AM      Failed - Completed PHQ-2 or PHQ-9 in the last 360 days      Passed - Cr in normal range and within 360 days    Creat  Date Value Ref Range Status  06/06/2022 0.73 0.70 - 1.30 mg/dL Final         Passed - Valid encounter within last 12 months    Recent Outpatient Visits           4 months ago Other infective acute otitis externa of right ear   [facility] [place], Salvadore Oxford, NP   7 months ago Acute nasopharyngitis   [facility] [place], Salvadore Oxford, NP   8 months ago Pure hypertriglyceridemia   [facility] [place], Salvadore Oxford, NP   1 year ago Acute nasopharyngitis   [facility] [place], Salvadore Oxford, NP   1 year ago Acute non-recurrent frontal sinusitis   [facility], [place], [place]

## 2022-11-09 ENCOUNTER — Other Ambulatory Visit: Payer: Self-pay | Admitting: Internal Medicine

## 2022-11-09 DIAGNOSIS — G546 Phantom limb syndrome with pain: Secondary | ICD-10-CM

## 2022-11-10 NOTE — Telephone Encounter (Signed)
Requested Prescriptions  Pending Prescriptions Disp Refills   gabapentin (NEURONTIN) 300 MG capsule [Pharmacy Med Name: GABAPENTIN 300MG  CAPSULES] 90 capsule 0    Sig: TAKE 1 CAPSULE(300 MG) BY MOUTH DAILY     Neurology: Anticonvulsants - gabapentin Failed - 11/09/2022 10:54 PM      Failed - Completed PHQ-2 or PHQ-9 in the last 360 days      Passed - Cr in normal range and within 360 days    Creat  Date Value Ref Range Status  06/06/2022 0.73 0.70 - 1.30 mg/dL Final         Passed - Valid encounter within last 12 months    Recent Outpatient Visits           5 months ago Other infective acute otitis externa of right ear   [place] [facility] [place], Salvadore Oxford, NP   7 months ago Acute nasopharyngitis   [place] [facility] [place], Salvadore Oxford, NP   8 months ago Pure hypertriglyceridemia   [place] [facility] [place], Salvadore Oxford, NP   1 year ago Acute nasopharyngitis   [place] [facility], Salvadore Oxford, NP   1 year ago Acute non-recurrent frontal sinusitis   [place] [facility] [place], Netta Neat, DO       Future Appointments             In 4 days Baity, Salvadore Oxford, NP [facility], [facility]             nortriptyline (PAMELOR) 25 MG capsule [Pharmacy Med Name: NORTRIPTYLINE 25MG  CAPSULES] 90 capsule 0    Sig: TAKE 1 CAPSULE(25 MG) BY MOUTH AT BEDTIME     Psychiatry:  Antidepressants - Heterocyclics (TCAs) Passed - 11/09/2022 10:54 PM      Passed - Valid encounter within last 6 months    Recent Outpatient Visits           5 months ago Other infective acute otitis externa of right ear   [place] [facility] [place], Salvadore Oxford, NP   7 months ago Acute nasopharyngitis   [place] [facility] [place], Salvadore Oxford, NP   8 months ago Pure hypertriglyceridemia   [place] [facility] [place], Salvadore Oxford, NP   1 year  ago Acute nasopharyngitis   [place] [facility] [place], Salvadore Oxford, NP   1 year ago Acute non-recurrent frontal sinusitis   [place] [facility] Smitty Cords, DO       Future Appointments             In 4 days Baity, Salvadore Oxford, NP  [facility]

## 2022-11-14 ENCOUNTER — Ambulatory Visit
Admission: RE | Admit: 2022-11-14 | Discharge: 2022-11-14 | Disposition: A | Discharge status: Home / Self Care | Payer: Medicare Other | Attending: Internal Medicine | Admitting: Internal Medicine

## 2022-11-14 ENCOUNTER — Encounter: Payer: Self-pay | Admitting: Internal Medicine

## 2022-11-14 ENCOUNTER — Ambulatory Visit
Admission: RE | Admit: 2022-11-14 | Discharge: 2022-11-14 | Disposition: A | Discharge status: Home / Self Care | Payer: Medicare Other | Source: Ambulatory Visit | Attending: Internal Medicine | Admitting: Internal Medicine

## 2022-11-14 ENCOUNTER — Ambulatory Visit (INDEPENDENT_AMBULATORY_CARE_PROVIDER_SITE_OTHER): Payer: Medicare Other | Admitting: Internal Medicine

## 2022-11-14 VITALS — BP 134/86 | HR 81 | Temp 95.7°F | Ht 68.0 in | Wt 208.0 lb

## 2022-11-14 DIAGNOSIS — R7309 Other abnormal glucose: Secondary | ICD-10-CM

## 2022-11-14 DIAGNOSIS — Z125 Encounter for screening for malignant neoplasm of prostate: Secondary | ICD-10-CM

## 2022-11-14 DIAGNOSIS — E6609 Other obesity due to excess calories: Secondary | ICD-10-CM

## 2022-11-14 DIAGNOSIS — Z0001 Encounter for general adult medical examination with abnormal findings: Secondary | ICD-10-CM | POA: Diagnosis not present

## 2022-11-14 DIAGNOSIS — R200 Anesthesia of skin: Secondary | ICD-10-CM | POA: Diagnosis not present

## 2022-11-14 DIAGNOSIS — M79672 Pain in left foot: Secondary | ICD-10-CM | POA: Insufficient documentation

## 2022-11-14 DIAGNOSIS — R202 Paresthesia of skin: Secondary | ICD-10-CM | POA: Diagnosis not present

## 2022-11-14 DIAGNOSIS — E785 Hyperlipidemia, unspecified: Secondary | ICD-10-CM | POA: Diagnosis not present

## 2022-11-14 DIAGNOSIS — Z1211 Encounter for screening for malignant neoplasm of colon: Secondary | ICD-10-CM | POA: Diagnosis not present

## 2022-11-14 DIAGNOSIS — Z6831 Body mass index (BMI) 31.0-31.9, adult: Secondary | ICD-10-CM

## 2022-11-14 NOTE — Progress Notes (Signed)
Subjective:    Patient ID: Brandon Myers, male    DOB: [birth year], 57 y.o.   MRN: [id number]  HPI  Patient presents to clinic today for his annual exam.  Flu: 04/2022 Tetanus: unsure COVID: never Pneumovax: 08/2021 Prevnar: 12/2016 Shingrix: Never PSA screening: 11/2020 Colon screening: > 10 years Vision screening: as needed Dentist: as needed  Diet: He does eat some meat. He eats more fruits than veggies. He does eat some fried foods. He drinks mostly sweet tea. Exercise: None, disabled    Review of Systems  Past Medical History:  Diagnosis Date   Allergy    Anemia 03/03/2022   Anxiety 01/21/2020   Controlled substance agreement signed 04/02/2017   GERD (gastroesophageal reflux disease)    Hypertension    Neutropenia, congenital    Phantom limb pain     Current Outpatient Medications  Medication Sig Dispense Refill   acetaminophen (TYLENOL) 650 MG CR tablet Take 1,300 mg by mouth 2 (two) times daily as needed for pain.     albuterol (VENTOLIN HFA) 108 (90 Base) MCG/ACT inhaler INHALE 2 PUFFS BY MOUTH FOUR TIMES DAILY AS NEEDED 18 g 12   atorvastatin (LIPITOR) 10 MG tablet TAKE 1 TABLET(10 MG) BY MOUTH DAILY 90 tablet 0   ciprofloxacin-dexamethasone (CIPRODEX) OTIC suspension Place 4 drops into the right ear 2 (two) times daily. 7.5 mL 0   filgrastim-sndz (ZARXIO) 480 MCG/0.8ML SOSY injection Inject 0.8 mLs into the muscle every other day.     gabapentin (NEURONTIN) 300 MG capsule TAKE 1 CAPSULE(300 MG) BY MOUTH DAILY 90 capsule 0   gabapentin (NEURONTIN) 800 MG tablet TAKE 1 TABLET(800 MG) BY MOUTH THREE TIMES DAILY 270 tablet 0   loratadine (CLARITIN) 10 MG tablet TAKE 1 TABLET(10 MG) BY MOUTH DAILY (Patient taking differently: daily as needed.) 90 tablet 3   losartan (COZAAR) 100 MG tablet TAKE 1 TABLET(100 MG) BY MOUTH DAILY 90 tablet 1   meloxicam (MOBIC) 7.5 MG tablet Take 1 tablet (7.5 mg total) by mouth daily. (Patient taking differently: Take 7.5 mg by mouth daily  as needed.) 90 tablet 1   metoprolol succinate (TOPROL-XL) 100 MG 24 hr tablet TAKE 1 TABLET BY MOUTH DAILY FOLLOWING A MEAL 90 tablet 0   nortriptyline (PAMELOR) 25 MG capsule TAKE 1 CAPSULE(25 MG) BY MOUTH AT BEDTIME 90 capsule 0   omeprazole (PRILOSEC) 40 MG capsule TAKE 1 CAPSULE(40 MG) BY MOUTH DAILY 90 capsule 1   predniSONE (DELTASONE) 10 MG tablet Take 6 tabs on day 1, 5 tabs on day 2, 4 tabs on day 3, 3 tabs on day 4, 2 tabs on day 5, 1 tab on day 6 21 tablet 0   tiZANidine (ZANAFLEX) 4 MG tablet Take 1 tablet (4 mg total) by mouth at bedtime as needed for muscle spasms. 90 tablet 1   No current facility-administered medications for this visit.    Allergies  Allergen Reactions   Enalapril Cough    ACEi Cough   Hydrochlorothiazide     Sweats, hot flashes   Prochlorperazine     Other reaction(s): Other (See Comments) As a child unable to recall   Sulfa Antibiotics Other (See Comments)    Other reaction(s): Other (See Comments) As a child unable to recall   Benadryl [Diphenhydramine]     Increased phantom pain   Strawberry Extract Rash    Family History  Problem Relation Age of Onset   Heart Problems Mother    Asthma Mother  Depression Mother    Thyroid disease Mother    Alzheimer's disease Mother    Hypertension Mother    Stroke Maternal Grandmother    Cancer Maternal Grandmother    Cancer Father        skin   Hypertension Father    Cancer Paternal Grandmother    Cancer Paternal Grandfather     Social History   Socioeconomic History   Marital status: Married    Spouse name: Forrest Jaroszewski   Number of children: 2   Years of education: Not on file   Highest education level: Associate degree: occupational, Scientist, product/process development, or vocational program  Occupational History   Occupation: Disabled  Tobacco Use   Smoking status: Never   Smokeless tobacco: Never  Vaping Use   Vaping Use: Never used  Substance and Sexual Activity   Alcohol use: Never    Alcohol/week: 0.0  standard drinks of alcohol   Drug use: Never   Sexual activity: Never  Other Topics Concern   Not on file  Social History Narrative   Not on file   Social Determinants of Health   Financial Resource Strain: Medium Risk (11/13/2022)   Overall Financial Resource Strain (CARDIA)    Difficulty of Paying Living Expenses: Somewhat hard  Food Insecurity: Food Insecurity Present (11/13/2022)   Hunger Vital Sign    Worried About Running Out of Food in the Last Year: Sometimes true    Ran Out of Food in the Last Year: Sometimes true  Transportation Needs: No Transportation Needs (11/13/2022)   PRAPARE - Administrator, Civil Service (Medical): No    Lack of Transportation (Non-Medical): No  Physical Activity: Unknown (11/13/2022)   Exercise Vital Sign    Days of Exercise per Week: Patient declined    Minutes of Exercise per Session: Not on file  Stress: No Stress Concern Present (11/13/2022)   [employer] of Occupational Health - Occupational Stress Questionnaire    Feeling of Stress : Only a little  Social Connections: Moderately Integrated (11/13/2022)   Social Connection and Isolation Panel [NHANES]    Frequency of Communication with Friends and Family: Once a week    Frequency of Social Gatherings with Friends and Family: Once a week    Attends Religious Services: More than 4 times per year    Active Member of [employer] or Organizations: Yes    Attends Engineer, structural: More than 4 times per year    Marital Status: Married  Catering manager Violence: Not At Risk (10/21/2021)   Humiliation, Afraid, Rape, and Kick questionnaire    Fear of Current or Ex-Partner: No    Emotionally Abused: No    Physically Abused: No    Sexually Abused: No     Constitutional: Denies fever, malaise, fatigue, headache or abrupt weight changes.  HEENT: Denies eye pain, eye redness, ear pain, ringing in the ears, wax buildup, runny nose, nasal congestion, bloody nose, or sore  throat. Respiratory: Denies difficulty breathing, shortness of breath, cough or sputum production.   Cardiovascular: Denies chest pain, chest tightness, palpitations or swelling in the hands or feet.  Gastrointestinal: Patient reports constipation.  Denies abdominal pain, bloating, diarrhea or blood in the stool.  GU: Denies urgency, frequency, pain with urination, burning sensation, blood in urine, odor or discharge. Musculoskeletal: Pt reports left foot pain. Denies decrease in range of motion, difficulty with gait, muscle pain or joint pain and swelling.  Skin: Denies redness, rashes, lesions or ulcercations.  Neurological: Pt  reports paresthesia of left foot. Denies dizziness, difficulty with memory, difficulty with speech or problems with balance and coordination.  Psych: Patient has a history of anxiety.  Denies depression, SI/HI.  No other specific complaints in a complete review of systems (except as listed in HPI above).     Objective:   Physical Exam   BP 134/86 (BP Location: Left Arm, Patient Position: Sitting, Cuff Size: Normal)   Pulse 81   Temp (!) 95.7 F (35.4 C) (Temporal)   Ht 5\' 8"  (1.727 m)   Wt 208 lb (94.3 kg)   SpO2 97%   BMI 31.63 kg/m   Wt Readings from Last 3 Encounters:  06/13/22 207 lb (93.9 kg)  03/22/22 210 lb (95.3 kg)  03/03/22 211 lb (95.7 kg)    General: Appears his stated age, obese, chronically ill appearing, in NAD. Skin: Warm, clammy and intact.  HEENT: Head: normal shape and size; Eyes: sclera white, no icterus, conjunctiva pink, PERRLA and EOMs intact;  Neck:  Neck supple, trachea midline. No masses, lumps or thyromegaly present.  Cardiovascular: Normal rate and rhythm. S1,S2 noted.  No murmur, rubs or gallops noted. No JVD or BLE edema. No carotid bruits noted. Pedal pulse 2+ on the left. Pulmonary/Chest: Normal effort and positive vesicular breath sounds. No respiratory distress. No wheezes, rales or ronchi noted.  Abdomen: Normal bowel  sounds.  Musculoskeletal: Absence of right upper extremity. Prosthesis noted of RLE. Normal flexion, extension and rotation of the left ankle. No pain with palpation of the left foot. No joint swelling noted. Limping gait with use of prosthesis. Neurological: Alert and oriented. Cranial nerves II-XII grossly intact. Coordination normal.  Psychiatric: Mood and affect normal. Behavior is normal. Judgment and thought content normal.     BMET    Component Value Date/Time   NA 139 06/06/2022 0841   NA 139 12/23/2019 1447   K 4.3 06/06/2022 0841   CL 105 06/06/2022 0841   CO2 24 06/06/2022 0841   GLUCOSE 96 06/06/2022 0841   BUN 16 06/06/2022 0841   BUN 13 12/23/2019 1447   CREATININE 0.73 06/06/2022 0841   CALCIUM 9.8 06/06/2022 0841   GFRNONAA 100 12/23/2019 1447   GFRNONAA >89 12/09/2016 0934   GFRAA 116 12/23/2019 1447   GFRAA >89 12/09/2016 0934    Lipid Panel     Component Value Date/Time   CHOL 101 06/06/2022 0841   CHOL 139 12/23/2019 1447   TRIG 203 (H) 06/06/2022 0841   HDL 23 (L) 06/06/2022 0841   HDL 23 (L) 12/23/2019 1447   CHOLHDL 4.4 06/06/2022 0841   VLDL 29 12/09/2016 0934   LDLCALC 51 06/06/2022 0841    CBC    Component Value Date/Time   WBC 7.3 03/03/2022 1434   RBC 4.38 03/03/2022 1434   HGB 11.3 (L) 03/03/2022 1434   HGB 14.1 11/27/2018 1344   HCT 36.3 (L) 03/03/2022 1434   HCT 41.8 11/27/2018 1344   PLT 229 03/03/2022 1434   PLT 200 11/27/2018 1344   MCV 82.9 03/03/2022 1434   MCV 83 11/27/2018 1344   MCH 25.8 (L) 03/03/2022 1434   MCHC 31.1 (L) 03/03/2022 1434   RDW 14.5 03/03/2022 1434   RDW 13.1 11/27/2018 1344   LYMPHSABS 1.9 11/27/2018 1344   MONOABS 1.4 (H) 10/26/2018 2318   EOSABS 0.5 (H) 11/27/2018 1344   BASOSABS 0.2 11/27/2018 1344    Hgb A1C Lab Results  Component Value Date   HGBA1C 5.5 08/12/2021  Assessment & Plan:   Preventative Health Maintenance:  Encouraged him to get a flu shot in the fall He  declines tetanus for financial reasons, advised him if he gets better To go get this done Pneumovax UTD Encouraged him to get his COVID-vaccine Discussed Shingrix vaccine, he will check coverage with his insurance company and schedule visit at the pharmacy if he would like to have this done Cologuard ordered Encouraged him to consume a balanced diet and exercise regimen Advised him to see an eye doctor and dentist annually We will check CBC, c-Met, lipid, A1c and PSA today  Pain and Paresthesia of Left Foot:  Will check TSH, Vit D, B12, and xray left foot today Consider referral to podiatry for further evaluation of symptoms  RTC in 6 months, follow-up chronic conditions Nicki Reaper, NP

## 2022-11-14 NOTE — Patient Instructions (Signed)
Health Maintenance, Male Adopting a healthy lifestyle and getting preventive care are important in promoting health and wellness. Ask your health care provider about: The right schedule for you to have regular tests and exams. Things you can do on your own to prevent diseases and keep yourself healthy. What should I know about diet, weight, and exercise? Eat a healthy diet  Eat a diet that includes plenty of vegetables, fruits, low-fat dairy products, and lean protein. Do not eat a lot of foods that are high in solid fats, added sugars, or sodium. Maintain a healthy weight Body mass index (BMI) is a measurement that can be used to identify possible weight problems. It estimates body fat based on height and weight. Your health care provider can help determine your BMI and help you achieve or maintain a healthy weight. Get regular exercise Get regular exercise. This is one of the most important things you can do for your health. Most adults should: Exercise for at least 150 minutes each week. The exercise should increase your heart rate and make you sweat (moderate-intensity exercise). Do strengthening exercises at least twice a week. This is in addition to the moderate-intensity exercise. Spend less time sitting. Even light physical activity can be beneficial. Watch cholesterol and blood lipids Have your blood tested for lipids and cholesterol at 55 years of age, then have this test every 5 years. You may need to have your cholesterol levels checked more often if: Your lipid or cholesterol levels are high. You are older than 55 years of age. You are at high risk for heart disease. What should I know about cancer screening? Many types of cancers can be detected early and may often be prevented. Depending on your health history and family history, you may need to have cancer screening at various ages. This may include screening for: Colorectal cancer. Prostate cancer. Skin cancer. Lung  cancer. What should I know about heart disease, diabetes, and high blood pressure? Blood pressure and heart disease High blood pressure causes heart disease and increases the risk of stroke. This is more likely to develop in people who have high blood pressure readings or are overweight. Talk with your health care provider about your target blood pressure readings. Have your blood pressure checked: Every 3-5 years if you are 18-39 years of age. Every year if you are 40 years old or older. If you are between the ages of 65 and 75 and are a current or former smoker, ask your health care provider if you should have a one-time screening for abdominal aortic aneurysm (AAA). Diabetes Have regular diabetes screenings. This checks your fasting blood sugar level. Have the screening done: Once every three years after age 45 if you are at a normal weight and have a low risk for diabetes. More often and at a younger age if you are overweight or have a high risk for diabetes. What should I know about preventing infection? Hepatitis B If you have a higher risk for hepatitis B, you should be screened for this virus. Talk with your health care provider to find out if you are at risk for hepatitis B infection. Hepatitis C Blood testing is recommended for: Everyone born from 1945 through 1965. Anyone with known risk factors for hepatitis C. Sexually transmitted infections (STIs) You should be screened each year for STIs, including gonorrhea and chlamydia, if: You are sexually active and are younger than 55 years of age. You are older than 55 years of age and your   health care provider tells you that you are at risk for this type of infection. Your sexual activity has changed since you were last screened, and you are at increased risk for chlamydia or gonorrhea. Ask your health care provider if you are at risk. Ask your health care provider about whether you are at high risk for HIV. Your health care provider  may recommend a prescription medicine to help prevent HIV infection. If you choose to take medicine to prevent HIV, you should first get tested for HIV. You should then be tested every 3 months for as long as you are taking the medicine. Follow these instructions at home: Alcohol use Do not drink alcohol if your health care provider tells you not to drink. If you drink alcohol: Limit how much you have to 0-2 drinks a day. Know how much alcohol is in your drink. In the [place] one drink equals one 12 oz bottle of beer (355 mL), one 5 oz glass of wine (148 mL), or one 1 oz glass of hard liquor (44 mL). Lifestyle Do not use any products that contain nicotine or tobacco. These products include cigarettes, chewing tobacco, and vaping devices, such as e-cigarettes. If you need help quitting, ask your health care provider. Do not use street drugs. Do not share needles. Ask your health care provider for help if you need support or information about quitting drugs. General instructions Schedule regular health, dental, and eye exams. Stay current with your vaccines. Tell your health care provider if: You often feel depressed. You have ever been abused or do not feel safe at home. Summary Adopting a healthy lifestyle and getting preventive care are important in promoting health and wellness. Follow your health care provider's instructions about healthy diet, exercising, and getting tested or screened for diseases. Follow your health care provider's instructions on monitoring your cholesterol and blood pressure. This information is not intended to replace advice given to you by your health care provider. Make sure you discuss any questions you have with your health care provider. Document Revised: 12/07/2020 Document Reviewed: 12/07/2020 Elsevier Patient Education  2023 Elsevier Inc.  

## 2022-11-14 NOTE — Assessment & Plan Note (Signed)
Encouraged diet and exercise for weight loss ?

## 2022-11-15 ENCOUNTER — Telehealth: Payer: Self-pay

## 2022-11-15 DIAGNOSIS — R748 Abnormal levels of other serum enzymes: Secondary | ICD-10-CM

## 2022-11-15 LAB — COMPLETE METABOLIC PANEL WITH GFR
AG Ratio: 1.6 (calc) (ref 1.0–2.5)
ALT: 64 U/L — ABNORMAL HIGH (ref 9–46)
AST: 67 U/L — ABNORMAL HIGH (ref 10–35)
Albumin: 4.5 g/dL (ref 3.6–5.1)
Alkaline phosphatase (APISO): 145 U/L — ABNORMAL HIGH (ref 35–144)
BUN: 16 mg/dL (ref 7–25)
CO2: 28 mmol/L (ref 20–32)
Calcium: 9.9 mg/dL (ref 8.6–10.3)
Chloride: 101 mmol/L (ref 98–110)
Creat: 0.83 mg/dL (ref 0.70–1.30)
Globulin: 2.9 g/dL (calc) (ref 1.9–3.7)
Glucose, Bld: 170 mg/dL — ABNORMAL HIGH (ref 65–99)
Potassium: 4.6 mmol/L (ref 3.5–5.3)
Sodium: 139 mmol/L (ref 135–146)
Total Bilirubin: 0.5 mg/dL (ref 0.2–1.2)
Total Protein: 7.4 g/dL (ref 6.1–8.1)
eGFR: 104 mL/min/{1.73_m2} (ref >=60)

## 2022-11-15 NOTE — Telephone Encounter (Signed)
Liver ultrasound ordered.

## 2022-11-15 NOTE — Addendum Note (Signed)
Addended by: Lorre Munroe on: 11/15/2022 02:55 PM   Modules accepted: Orders

## 2022-11-15 NOTE — Telephone Encounter (Signed)
-----   Message from Lorre Munroe, NP sent at 11/15/2022  8:37 AM EDT ----- Can you please call the patient and have him come back to the lab.  They only drew 2 of the test that were ordered.  Liver enzymes are more elevated.  Has he been having any abdominal pain, vomiting or diarrhea?  He should avoid excessive Tylenol and alcohol use.  We could obtain ultrasound of the liver for further evaluation of these elevated liver enzymes.  Let me know if he is agreeable.  Kidney function is normal.

## 2022-11-15 NOTE — Addendum Note (Signed)
Addended by: Lorre Munroe on: 11/15/2022 08:36 AM   Modules accepted: Orders

## 2022-11-15 NOTE — Telephone Encounter (Signed)
Spoke with patient.  Informed him of results and recommendations.  Appointment scheduled.  He is agreeable to ultrasound.

## 2022-11-17 ENCOUNTER — Other Ambulatory Visit: Payer: Medicare Other

## 2022-11-17 DIAGNOSIS — R7309 Other abnormal glucose: Secondary | ICD-10-CM

## 2022-11-17 DIAGNOSIS — E785 Hyperlipidemia, unspecified: Secondary | ICD-10-CM | POA: Diagnosis not present

## 2022-11-17 DIAGNOSIS — E559 Vitamin D deficiency, unspecified: Secondary | ICD-10-CM | POA: Diagnosis not present

## 2022-11-17 DIAGNOSIS — R202 Paresthesia of skin: Secondary | ICD-10-CM | POA: Diagnosis not present

## 2022-11-17 DIAGNOSIS — Z0001 Encounter for general adult medical examination with abnormal findings: Secondary | ICD-10-CM

## 2022-11-17 DIAGNOSIS — Z125 Encounter for screening for malignant neoplasm of prostate: Secondary | ICD-10-CM

## 2022-11-18 ENCOUNTER — Encounter: Payer: Self-pay | Admitting: Internal Medicine

## 2022-11-18 LAB — PSA: PSA: 0.06 ng/mL

## 2022-11-18 LAB — CBC
HCT: 40.9 % (ref 38.5–50.0)
Hemoglobin: 13.1 g/dL — ABNORMAL LOW (ref 13.2–17.1)
MCH: 27.8 pg (ref 27.0–33.0)
MCHC: 32 g/dL (ref 32.0–36.0)
MCV: 86.8 fL (ref 80.0–100.0)
MPV: 10.3 fL (ref 7.5–12.5)
Platelets: 173 Thousand/uL (ref 140–400)
RBC: 4.71 Million/uL (ref 4.20–5.80)
RDW: 12.5 % (ref 11.0–15.0)
WBC: 6.2 Thousand/uL (ref 3.8–10.8)

## 2022-11-18 LAB — HEMOGLOBIN A1C
Hgb A1c MFr Bld: 6.2 % of total Hgb — ABNORMAL HIGH (ref <5.7)
Mean Plasma Glucose: 131 mg/dL
eAG (mmol/L): 7.3 mmol/L

## 2022-11-18 LAB — LIPID PANEL
Cholesterol: 99 mg/dL
HDL: 24 mg/dL — ABNORMAL LOW
LDL Cholesterol (Calc): 52 mg/dL
Non-HDL Cholesterol (Calc): 75 mg/dL
Total CHOL/HDL Ratio: 4.1 (calc)
Triglycerides: 155 mg/dL — ABNORMAL HIGH

## 2022-11-18 LAB — VITAMIN B12: Vitamin B-12: 1669 pg/mL — ABNORMAL HIGH (ref 200–1100)

## 2022-11-18 LAB — TSH: TSH: 3.67 mIU/L (ref 0.40–4.50)

## 2022-11-18 LAB — VITAMIN D 25 HYDROXY (VIT D DEFICIENCY, FRACTURES): Vit D, 25-Hydroxy: 36 ng/mL (ref 30–100)

## 2022-11-22 ENCOUNTER — Ambulatory Visit: Payer: Medicare Other

## 2022-11-23 ENCOUNTER — Other Ambulatory Visit: Payer: Self-pay | Admitting: Internal Medicine

## 2022-11-24 NOTE — Telephone Encounter (Signed)
Requested Prescriptions  Pending Prescriptions Disp Refills   atorvastatin (LIPITOR) 10 MG tablet [Pharmacy Med Name: ATORVASTATIN  TABLETS] 90 tablet 3    Sig: TAKE 1 TABLET(10 MG) BY MOUTH DAILY     Cardiovascular:  Antilipid - Statins Failed - 11/23/2022  7:04 PM      Failed - Lipid Panel in normal range within the last 12 months    Cholesterol, Total  Date Value Ref Range Status  12/23/2019 139 100 - 199 mg/dL Final   Cholesterol  Date Value Ref Range Status  11/17/2022 99 <200 mg/dL Final   LDL Cholesterol (Calc)  Date Value Ref Range Status  11/17/2022 52 mg/dL (calc) Final    Comment:    Reference range: <100 . Desirable range <100 mg/dL for primary prevention;   <70 mg/dL for patients with CHD or diabetic patients  with > or = 2 CHD risk factors. Marland Kitchen LDL-C is now calculated using the Martin-Hopkins  calculation, which is a validated novel method providing  better accuracy than the Friedewald equation in the  estimation of LDL-C.  Horald Pollen et al. Lenox Ahr. 1610;960(45): 2061-2068  (http://education.[employer].com/faq/FAQ164)    HDL  Date Value Ref Range Status  11/17/2022 24 (L) > OR = 40 mg/dL Final  40/98/1191 23 (L) >39 mg/dL Final   Triglycerides  Date Value Ref Range Status  11/17/2022 155 (H) <150 mg/dL Final         Passed - Patient is not pregnant      Passed - Valid encounter within last 12 months    Recent Outpatient Visits           1 week ago Encounter for general adult medical examination with abnormal findings   [place] [facility] [place], Salvadore Oxford, NP   5 months ago Other infective acute otitis externa of right ear   [place] [facility], Salvadore Oxford, NP   8 months ago Acute nasopharyngitis   [place] [facility] [place], Salvadore Oxford, NP   8 months ago Pure hypertriglyceridemia   [place] [facility] [place], Salvadore Oxford, NP   1 year ago Acute nasopharyngitis    [place] [facility] [place], Salvadore Oxford, NP       Future Appointments             In 5 months Baity, Salvadore Oxford, NP [place] [facility]

## 2022-11-28 DIAGNOSIS — Z1211 Encounter for screening for malignant neoplasm of colon: Secondary | ICD-10-CM | POA: Diagnosis not present

## 2022-12-07 ENCOUNTER — Other Ambulatory Visit: Payer: Self-pay | Admitting: Internal Medicine

## 2022-12-07 DIAGNOSIS — G546 Phantom limb syndrome with pain: Secondary | ICD-10-CM

## 2022-12-07 NOTE — Telephone Encounter (Signed)
Requested Prescriptions  Pending Prescriptions Disp Refills   gabapentin (NEURONTIN) 300 MG capsule [Pharmacy Med Name: GABAPENTIN 300MG  CAPSULES] 90 capsule 0    Sig: TAKE 1 CAPSULE(300 MG) BY MOUTH DAILY     Neurology: Anticonvulsants - gabapentin Passed - 12/07/2022 11:50 AM      Passed - Cr in normal range and within 360 days    Creat  Date Value Ref Range Status  11/14/2022 0.83 0.70 - 1.30 mg/dL Final         Passed - Completed PHQ-2 or PHQ-9 in the last 360 days      Passed - Valid encounter within last 12 months    Recent Outpatient Visits           3 weeks ago Encounter for general adult medical examination with abnormal findings   [facility] [place], Salvadore Oxford, NP   5 months ago Other infective acute otitis externa of right ear   [place] [facility], Salvadore Oxford, NP   8 months ago Acute nasopharyngitis   [place] [place], Salvadore Oxford, NP   9 months ago Pure hypertriglyceridemia   [place] [facility] [place], Salvadore Oxford, NP   1 year ago Acute nasopharyngitis   [place] [facility] [place], Salvadore Oxford, NP       Future Appointments             In 5 months Baity, Salvadore Oxford, NP [place] [facility]

## 2022-12-08 LAB — COLOGUARD: COLOGUARD: POSITIVE — AB

## 2022-12-12 ENCOUNTER — Telehealth: Payer: Self-pay | Admitting: Internal Medicine

## 2022-12-12 ENCOUNTER — Encounter: Payer: Self-pay | Admitting: Internal Medicine

## 2022-12-12 ENCOUNTER — Ambulatory Visit: Payer: Medicare Other | Admitting: Internal Medicine

## 2022-12-12 DIAGNOSIS — R195 Other fecal abnormalities: Secondary | ICD-10-CM

## 2022-12-12 NOTE — Telephone Encounter (Signed)
Called patient to schedule Medicare Annual Wellness Visit (AWV). Left message for patient to call back and schedule Medicare Annual Wellness Visit (AWV).  Last date of AWV: 10/21/21  Please schedule an appointment at any time with Kennedy Bucker, LPN  .  If any questions, please contact me.  Thank you ,  Verlee Rossetti; Care Guide Ambulatory Clinical Support  [facility] [place] [facility] Direct Dial: [phone]

## 2022-12-13 ENCOUNTER — Telehealth: Payer: Self-pay

## 2022-12-13 DIAGNOSIS — Z1211 Encounter for screening for malignant neoplasm of colon: Secondary | ICD-10-CM

## 2022-12-13 DIAGNOSIS — R195 Other fecal abnormalities: Secondary | ICD-10-CM

## 2022-12-13 NOTE — Telephone Encounter (Signed)
Pt left message to schedule colonoscopy please return call  

## 2022-12-14 ENCOUNTER — Other Ambulatory Visit: Payer: Self-pay

## 2022-12-14 DIAGNOSIS — Z1211 Encounter for screening for malignant neoplasm of colon: Secondary | ICD-10-CM

## 2022-12-14 DIAGNOSIS — R195 Other fecal abnormalities: Secondary | ICD-10-CM

## 2022-12-14 MED ORDER — NA SULFATE-K SULFATE-MG SULF 17.5-3.13-1.6 GM/177ML PO SOLN: 1.0000 | Freq: Once | ORAL | 0 refills | Status: AC

## 2022-12-14 NOTE — Telephone Encounter (Signed)
Returned patients call to schedule his colonoscopy.  LVM for pt to return my call.   Thanks, Jamont Mellin, CMA 

## 2022-12-14 NOTE — Telephone Encounter (Signed)
Returned patients call to schedule his colonoscopy.  LVM for pt to return my call.   Thanks, Meron Bocchino, CMA 

## 2022-12-14 NOTE — Telephone Encounter (Signed)
Gastroenterology Pre-Procedure Review  Request Date: 12/28/22 Requesting Physician: Dr. Tobi Bastos  PATIENT REVIEW QUESTIONS: The patient responded to the following health history questions as indicated:    1. Are you having any GI issues? no 2. Do you have a personal history of Polyps? no 3. Do you have a family history of Colon Cancer or Polyps? no 4. Diabetes Mellitus? no 5. Joint replacements in the past 12 months?no 6. Major health problems in the past 3 months?no 7. Any artificial heart valves, MVP, or defibrillator?no    MEDICATIONS & ALLERGIES:    Patient reports the following regarding taking any anticoagulation/antiplatelet therapy:   Plavix, Coumadin, Eliquis, Xarelto, Lovenox, Pradaxa, Brilinta, or Effient? no Aspirin? no  Patient confirms/reports the following medications:  Current Outpatient Medications  Medication Sig Dispense Refill   acetaminophen (TYLENOL) 650 MG CR tablet Take 1,300 mg by mouth 2 (two) times daily as needed for pain.     albuterol (VENTOLIN HFA) 108 (90 Base) MCG/ACT inhaler INHALE 2 PUFFS BY MOUTH FOUR TIMES DAILY AS NEEDED 18 g 12   atorvastatin (LIPITOR) 10 MG tablet TAKE 1 TABLET(10 MG) BY MOUTH DAILY 90 tablet 3   filgrastim-sndz (ZARXIO) 480 MCG/0.8ML SOSY injection Inject 0.8 mLs into the muscle every other day.     gabapentin (NEURONTIN) 300 MG capsule TAKE 1 CAPSULE(300 MG) BY MOUTH DAILY 90 capsule 0   gabapentin (NEURONTIN) 800 MG tablet TAKE 1 TABLET(800 MG) BY MOUTH THREE TIMES DAILY 270 tablet 0   loratadine (CLARITIN) 10 MG tablet TAKE 1 TABLET(10 MG) BY MOUTH DAILY (Patient taking differently: daily as needed.) 90 tablet 3   losartan (COZAAR) 100 MG tablet TAKE 1 TABLET(100 MG) BY MOUTH DAILY 90 tablet 1   metoprolol succinate (TOPROL-XL) 100 MG 24 hr tablet TAKE 1 TABLET BY MOUTH DAILY FOLLOWING A MEAL 90 tablet 0   nortriptyline (PAMELOR) 25 MG capsule TAKE 1 CAPSULE(25 MG) BY MOUTH AT BEDTIME 90 capsule 0   omeprazole (PRILOSEC) 40 MG  capsule TAKE 1 CAPSULE(40 MG) BY MOUTH DAILY 90 capsule 1   tiZANidine (ZANAFLEX) 4 MG tablet Take 1 tablet (4 mg total) by mouth at bedtime as needed for muscle spasms. 90 tablet 1   No current facility-administered medications for this visit.    Patient confirms/reports the following allergies:  Allergies  Allergen Reactions   Enalapril Cough    ACEi Cough   Hydrochlorothiazide     Sweats, hot flashes   Prochlorperazine     Other reaction(s): Other (See Comments) As a child unable to recall   Sulfa Antibiotics Other (See Comments)    Other reaction(s): Other (See Comments) As a child unable to recall   Benadryl [Diphenhydramine]     Increased phantom pain   Strawberry Extract Rash    No orders of the defined types were placed in this encounter.   AUTHORIZATION INFORMATION Primary Insurance: 1D#: Group #:  Secondary Insurance: 1D#: Group #:  SCHEDULE INFORMATION: Date: 12/28/22 Time: Location: [facility]

## 2022-12-14 NOTE — Addendum Note (Signed)
Addended by: Avie Arenas on: 12/14/2022 11:34 AM   Modules accepted: Orders

## 2022-12-16 ENCOUNTER — Other Ambulatory Visit: Payer: Self-pay | Admitting: Internal Medicine

## 2022-12-16 DIAGNOSIS — G546 Phantom limb syndrome with pain: Secondary | ICD-10-CM

## 2022-12-16 MED ORDER — GABAPENTIN 300 MG PO CAPS
300.0000 mg | ORAL_CAPSULE | Freq: Every day | ORAL | 0 refills | Status: DC
Start: 2022-12-16 — End: 2023-03-16

## 2022-12-27 ENCOUNTER — Encounter: Payer: Self-pay | Admitting: Gastroenterology

## 2022-12-28 ENCOUNTER — Ambulatory Visit
Admission: RE | Admit: 2022-12-28 | Discharge: 2022-12-28 | Disposition: A | Discharge status: Home / Self Care | Payer: Medicare Other | Attending: Gastroenterology | Admitting: Gastroenterology

## 2022-12-28 ENCOUNTER — Encounter
Admission: RE | Disposition: A | Discharge status: Home / Self Care | Payer: Self-pay | Source: Home / Self Care | Attending: Gastroenterology

## 2022-12-28 ENCOUNTER — Ambulatory Visit: Payer: Medicare Other | Admitting: Certified Registered"

## 2022-12-28 ENCOUNTER — Encounter: Payer: Self-pay | Admitting: Gastroenterology

## 2022-12-28 DIAGNOSIS — R195 Other fecal abnormalities: Secondary | ICD-10-CM | POA: Diagnosis not present

## 2022-12-28 DIAGNOSIS — Z980 Intestinal bypass and anastomosis status: Secondary | ICD-10-CM | POA: Diagnosis not present

## 2022-12-28 DIAGNOSIS — I10 Essential (primary) hypertension: Secondary | ICD-10-CM | POA: Diagnosis not present

## 2022-12-28 DIAGNOSIS — Z5986 Financial insecurity: Secondary | ICD-10-CM | POA: Diagnosis not present

## 2022-12-28 DIAGNOSIS — Z1211 Encounter for screening for malignant neoplasm of colon: Secondary | ICD-10-CM | POA: Diagnosis not present

## 2022-12-28 DIAGNOSIS — Z5941 Food insecurity: Secondary | ICD-10-CM | POA: Diagnosis not present

## 2022-12-28 HISTORY — PX: COLONOSCOPY WITH PROPOFOL: SHX5780

## 2022-12-28 SURGERY — COLONOSCOPY WITH PROPOFOL
Anesthesia: General

## 2022-12-28 MED ORDER — PROPOFOL 500 MG/50ML IV EMUL
INTRAVENOUS | Status: DC | PRN
  Administered 2022-12-28: 50 mg via INTRAVENOUS
  Administered 2022-12-28: 150 ug/kg/min via INTRAVENOUS

## 2022-12-28 MED ORDER — DEXMEDETOMIDINE HCL IN NACL 200 MCG/50ML IV SOLN
INTRAVENOUS | Status: DC | PRN
  Administered 2022-12-28 (×2): 8 ug via INTRAVENOUS

## 2022-12-28 MED ORDER — LIDOCAINE HCL (CARDIAC) PF 100 MG/5ML IV SOSY
PREFILLED_SYRINGE | INTRAVENOUS | Status: DC | PRN
  Administered 2022-12-28: 100 mg via INTRAVENOUS

## 2022-12-28 MED ORDER — MIDAZOLAM HCL 2 MG/2ML IJ SOLN
INTRAMUSCULAR | Status: AC
  Filled 2022-12-28: qty 2

## 2022-12-28 MED ORDER — MIDAZOLAM HCL 5 MG/5ML IJ SOLN
INTRAMUSCULAR | Status: DC | PRN
  Administered 2022-12-28: 2 mg via INTRAVENOUS

## 2022-12-28 MED ORDER — SODIUM CHLORIDE 0.9 % IV SOLN: INTRAVENOUS | Status: DC

## 2022-12-28 NOTE — Op Note (Signed)
[facility] Gastroenterology Patient Name: Brandon Myers Procedure Date: 12/28/2022 11:59 AM MRN: [id number] Account #: [id number] Date of Birth: February 28, 1966 Admit Type: Outpatient Age: 57 Room: [facility] ENDO ROOM 3 Gender: Male Note Status: Finalized Instrument Name: Nelda Marseille [id number] Procedure:             Colonoscopy Indications:           Screening for colorectal malignant neoplasm Providers:             Wyline Mood MD, MD Medicines:             Monitored Anesthesia Care Complications:         No immediate complications. Procedure:             Pre-Anesthesia Assessment:                        - Prior to the procedure, a History and Physical was                         performed, and patient medications, allergies and                         sensitivities were reviewed. The patient's tolerance                         of previous anesthesia was reviewed.                        - The risks and benefits of the procedure and the                         sedation options and risks were discussed with the                         patient. All questions were answered and informed                         consent was obtained.                        - ASA Grade Assessment: II - A patient with mild                         systemic disease.                        After obtaining informed consent, the colonoscope was                         passed under direct vision. Throughout the procedure,                         the patient's blood pressure, pulse, and oxygen                         saturations were monitored continuously. The                         Colonoscope was introduced through the anus and  advanced to the the ileocolonic anastomosis. The                         colonoscopy was performed with ease. The patient                         tolerated the procedure well. The quality of the bowel                         preparation was excellent. The  terminal ileum was                         photographed. Findings:      The perianal and digital rectal examinations were normal.      There was evidence of a prior end-to-side ileo-colonic anastomosis in       the transverse colon. This was patent and was characterized by healthy       appearing mucosa. The anastomosis was traversed.      The exam was otherwise without abnormality on direct and retroflexion       views. Impression:            - Patent end-to-side ileo-colonic anastomosis,                         characterized by healthy appearing mucosa.                        - The examination was otherwise normal on direct and                         retroflexion views.                        - No specimens collected. Recommendation:        - Discharge patient to home (with escort).                        - Resume previous diet.                        - Continue present medications.                        - Repeat colonoscopy in 10 years for screening                         purposes. Procedure Code(s):     --- Professional ---                        [id number], Colonoscopy, flexible; diagnostic, including                         collection of specimen(s) by brushing or washing, when                         performed (separate procedure) Diagnosis Code(s):     --- Professional ---                        Z12.11, Encounter for screening for malignant neoplasm  of colon                        Z98.0, Intestinal bypass and anastomosis status CPT copyright 2022 American Medical Association. All rights reserved. The codes documented in this report are preliminary and upon coder review may  be revised to meet current compliance requirements. Wyline Mood, MD Wyline Mood MD, MD 12/28/2022 1:09:15 PM This report has been signed electronically. Number of Addenda: 0 Note Initiated On: 12/28/2022 11:59 AM Scope Withdrawal Time: 0 hours 6 minutes 45 seconds  Total Procedure  Duration: 0 hours 8 minutes 34 seconds  Estimated Blood Loss:  Estimated blood loss: none.      [facility]

## 2022-12-28 NOTE — H&P (Signed)
Wyline Mood, MD [address], [place], [place], [address] [address], [address], [place], [place], [address] Phone: [phone]  Fax: [phone]  Primary Care Physician:  Lorre Munroe, NP   Pre-Procedure History & Physical: HPI:  Brandon Myers is a 57 y.o. male is here for an colonoscopy.   Past Medical History:  Diagnosis Date   Allergy    Anemia 03/03/2022   Anxiety 01/21/2020   Controlled substance agreement signed 04/02/2017   GERD (gastroesophageal reflux disease)    Hypertension    Neutropenia, congenital (HCC)    Phantom limb pain (HCC)     Past Surgical History:  Procedure Laterality Date   ABOVE ELBOW ARM AMPUTATION Right 1993   AMPUTATION     APPENDECTOMY     COLON SURGERY  1994   LEG AMPUTATION THROUGH KNEE Right 1993    Prior to Admission medications   Medication Sig Start Date End Date Taking? Authorizing Provider  atorvastatin (LIPITOR) 10 MG tablet TAKE 1 TABLET(10 MG) BY MOUTH DAILY 11/24/22  Yes Baity, Salvadore Oxford, NP  gabapentin (NEURONTIN) 300 MG capsule Take 1 capsule (300 mg total) by mouth daily. 12/16/22  Yes Lorre Munroe, NP  losartan (COZAAR) 100 MG tablet TAKE 1 TABLET(100 MG) BY MOUTH DAILY 08/11/22  Yes Baity, Salvadore Oxford, NP  metoprolol succinate (TOPROL-XL) 100 MG 24 hr tablet TAKE 1 TABLET BY MOUTH DAILY FOLLOWING A MEAL 10/20/22  Yes Lorre Munroe, NP  acetaminophen (TYLENOL) 650 MG CR tablet Take 1,300 mg by mouth 2 (two) times daily as needed for pain.    [provider]  albuterol (VENTOLIN HFA) 108 (90 Base) MCG/ACT inhaler INHALE 2 PUFFS BY MOUTH FOUR TIMES DAILY AS NEEDED 04/26/21   Lorre Munroe, NP  filgrastim-sndz (ZARXIO) 480 MCG/0.8ML SOSY injection Inject 0.8 mLs into the muscle every other day. 10/29/14   [provider]  gabapentin (NEURONTIN) 800 MG tablet TAKE 1 TABLET(800 MG) BY MOUTH THREE TIMES DAILY 11/04/22   Lorre Munroe, NP  loratadine (CLARITIN) 10 MG tablet TAKE 1 TABLET(10 MG) BY  MOUTH DAILY Patient taking differently: daily as needed. 01/20/20   Particia Nearing, PA-C  nortriptyline (PAMELOR) 25 MG capsule TAKE 1 CAPSULE(25 MG) BY MOUTH AT BEDTIME 11/10/22   Lorre Munroe, NP  omeprazole (PRILOSEC) 40 MG capsule TAKE 1 CAPSULE(40 MG) BY MOUTH DAILY 11/02/22   Lorre Munroe, NP  tiZANidine (ZANAFLEX) 4 MG tablet Take 1 tablet (4 mg total) by mouth at bedtime as needed for muscle spasms. 03/03/22   Lorre Munroe, NP    Allergies as of 12/14/2022 - Review Complete 11/14/2022  Allergen Reaction Noted   Enalapril Cough 12/06/2016   Hydrochlorothiazide  12/27/2018   Prochlorperazine  04/27/2015   Sulfa antibiotics Other (See Comments) 04/27/2015   Benadryl [diphenhydramine]  01/09/2018   Strawberry extract Rash 04/27/2015    Family History  Problem Relation Age of Onset   Heart Problems Mother    Asthma Mother    Depression Mother    Thyroid disease Mother    Alzheimer's disease Mother    Hypertension Mother    Stroke Maternal Grandmother    Cancer Maternal Grandmother    Cancer Father        skin   Hypertension Father    Cancer Paternal Grandmother    Cancer Paternal Grandfather     Social History   Socioeconomic History   Marital status: Married  Spouse name: Daveed Zabaleta   Number of children: 2   Years of education: Not on file   Highest education level: Associate degree: occupational, technical, or vocational program  Occupational History   Occupation: Disabled  Tobacco Use   Smoking status: Never   Smokeless tobacco: Never  Vaping Use   Vaping Use: Never used  Substance and Sexual Activity   Alcohol use: Never    Alcohol/week: 0.0 standard drinks of alcohol   Drug use: Never   Sexual activity: Never  Other Topics Concern   Not on file  Social History Narrative   Not on file   Social Determinants of Health   Financial Resource Strain: Medium Risk (11/13/2022)   Overall Financial Resource Strain (CARDIA)    Difficulty of  Paying Living Expenses: Somewhat hard  Food Insecurity: Food Insecurity Present (11/13/2022)   Hunger Vital Sign    Worried About Running Out of Food in the Last Year: Sometimes true    Ran Out of Food in the Last Year: Sometimes true  Transportation Needs: No Transportation Needs (11/13/2022)   PRAPARE - Administrator, Civil Service (Medical): No    Lack of Transportation (Non-Medical): No  Physical Activity: Unknown (11/13/2022)   Exercise Vital Sign    Days of Exercise per Week: Patient declined    Minutes of Exercise per Session: Not on file  Stress: No Stress Concern Present (11/13/2022)   [employer] of Occupational Health - Occupational Stress Questionnaire    Feeling of Stress : Only a little  Social Connections: Moderately Integrated (11/13/2022)   Social Connection and Isolation Panel [NHANES]    Frequency of Communication with Friends and Family: Once a week    Frequency of Social Gatherings with Friends and Family: Once a week    Attends Religious Services: More than 4 times per year    Active Member of [employer] or Organizations: Yes    Attends Engineer, structural: More than 4 times per year    Marital Status: Married  Catering manager Violence: Not At Risk (10/21/2021)   Humiliation, Afraid, Rape, and Kick questionnaire    Fear of Current or Ex-Partner: No    Emotionally Abused: No    Physically Abused: No    Sexually Abused: No    Review of Systems: See HPI, otherwise negative ROS  Physical Exam: BP (!) 151/105   Pulse 85   Temp (!) 96.3 F (35.7 C) (Temporal)   Resp 20   Ht 5\' 8"  (1.727 m)   Wt 91.2 kg   SpO2 97%   BMI 30.56 kg/m  General:   Alert,  pleasant and cooperative in NAD Head:  Normocephalic and atraumatic. Neck:  Supple; no masses or thyromegaly. Lungs:  Clear throughout to auscultation, normal respiratory effort.    Heart:  +S1, +S2, Regular rate and rhythm, No edema. Abdomen:  Soft, nontender and nondistended. Normal  bowel sounds, without guarding, and without rebound.   Neurologic:  Alert and  oriented x4;  grossly normal neurologically.  Impression/Plan: Brandon Myers is here for an colonoscopy to be performed for positive cologaurd  Risks, benefits, limitations, and alternatives regarding  colonoscopy have been reviewed with the patient.  Questions have been answered.  All parties agreeable.   Wyline Mood, MD  12/28/2022, 12:36 PM

## 2022-12-28 NOTE — Transfer of Care (Signed)
Immediate Anesthesia Transfer of Care Note  Patient: Brandon Myers  Procedure(s) Performed: COLONOSCOPY WITH PROPOFOL  Patient Location: PACU  Anesthesia Type:General  Level of Consciousness: drowsy  Airway & Oxygen Therapy: Patient Spontanous Breathing and Patient connected to nasal cannula oxygen  Post-op Assessment: Report given to RN and Post -op Vital signs reviewed and stable  Post vital signs: Reviewed and stable  Last Vitals:  Vitals Value Taken Time  BP 143/85 12/28/22 1310  Temp    Pulse 85 12/28/22 1311  Resp 21 12/28/22 1311  SpO2 96 % 12/28/22 1311  Vitals shown include unvalidated device data.  Last Pain:  Vitals:   12/28/22 1048  TempSrc: Temporal  PainSc: 0-No pain         Complications: No notable events documented.

## 2022-12-28 NOTE — Anesthesia Preprocedure Evaluation (Signed)
Anesthesia Evaluation  Patient identified by MRN, date of birth, ID band Patient awake    Reviewed: Allergy & Precautions, H&P , NPO status , Patient's Chart, lab work & pertinent test results, reviewed documented beta blocker date and time   Airway Mallampati: II   Neck ROM: full    Dental  (+) Poor Dentition   Pulmonary neg pulmonary ROS   Pulmonary exam normal        Cardiovascular Exercise Tolerance: Good hypertension, On Medications negative cardio ROS Normal cardiovascular exam Rhythm:regular Rate:Normal     Neuro/Psych   Anxiety      Neuromuscular disease  negative psych ROS   GI/Hepatic Neg liver ROS,GERD  Medicated,,  Endo/Other  negative endocrine ROS    Renal/GU negative Renal ROS  negative genitourinary   Musculoskeletal   Abdominal   Peds  Hematology  (+) Blood dyscrasia, anemia   Anesthesia Other Findings Past Medical History: No date: Allergy 03/03/2022: Anemia 01/21/2020: Anxiety 04/02/2017: Controlled substance agreement signed No date: GERD (gastroesophageal reflux disease) No date: Hypertension No date: Neutropenia, congenital (HCC) No date: Phantom limb pain (HCC) Past Surgical History: 1993: ABOVE ELBOW ARM AMPUTATION; Right No date: AMPUTATION No date: APPENDECTOMY 1994: COLON SURGERY 1993: LEG AMPUTATION THROUGH KNEE; Right BMI    Body Mass Index: 30.56 kg/m     Reproductive/Obstetrics negative OB ROS                             Anesthesia Physical Anesthesia Plan  ASA: 3  Anesthesia Plan: General   Post-op Pain Management:    Induction:   PONV Risk Score and Plan:   Airway Management Planned:   Additional Equipment:   Intra-op Plan:   Post-operative Plan:   Informed Consent: I have reviewed the patients History and Physical, chart, labs and discussed the procedure including the risks, benefits and alternatives for the proposed anesthesia  with the patient or authorized representative who has indicated his/her understanding and acceptance.     Dental Advisory Given  Plan Discussed with: CRNA  Anesthesia Plan Comments:        Anesthesia Quick Evaluation

## 2022-12-29 ENCOUNTER — Encounter: Payer: Self-pay | Admitting: Gastroenterology

## 2022-12-29 NOTE — Anesthesia Postprocedure Evaluation (Signed)
Anesthesia Post Note  Patient: Brandon Myers  Procedure(s) Performed: COLONOSCOPY WITH PROPOFOL  Patient location during evaluation: PACU Anesthesia Type: General Level of consciousness: awake and alert Pain management: pain level controlled Vital Signs Assessment: post-procedure vital signs reviewed and stable Respiratory status: spontaneous breathing, nonlabored ventilation, respiratory function stable and patient connected to nasal cannula oxygen Cardiovascular status: blood pressure returned to baseline and stable Postop Assessment: no apparent nausea or vomiting Anesthetic complications: no   There were no known notable events for this encounter.   Last Vitals:  Vitals:   12/28/22 1330 12/28/22 1333  BP: (!) 154/104 (!) 136/93  Pulse: 89 90  Resp: (!) 22 20  Temp:    SpO2: 96% 96%    Last Pain:  Vitals:   12/29/22 0743  TempSrc:   PainSc: 0-No pain                 Yevette Edwards

## 2023-01-09 ENCOUNTER — Ambulatory Visit: Payer: Medicare Other | Admitting: Internal Medicine

## 2023-01-11 ENCOUNTER — Ambulatory Visit (INDEPENDENT_AMBULATORY_CARE_PROVIDER_SITE_OTHER): Payer: Medicare Other | Admitting: Internal Medicine

## 2023-01-11 ENCOUNTER — Encounter: Payer: Self-pay | Admitting: Internal Medicine

## 2023-01-11 VITALS — BP 143/95 | HR 70 | Temp 95.4°F | Wt 206.0 lb

## 2023-01-11 DIAGNOSIS — S88919A Complete traumatic amputation of unspecified lower leg, level unspecified, initial encounter: Secondary | ICD-10-CM | POA: Diagnosis not present

## 2023-01-11 NOTE — Progress Notes (Signed)
Subjective:    Patient ID: Brandon Myers, male    DOB: 1965-10-21, 57 y.o.   MRN: [id number]  HPI  Patient presents to clinic today requesting a prescription for a new prosthetic leg.  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  He does not have any comorbidities that will impact his mobility or his ability to function with the prosthesis.  Brandon Myers currently has a prosthesis that is broken and not meeting his functional needs.  He reports the prosthesis feels loose and the liners are not working well for him.  His knee and foot are worn out and his socket has been losing suction causing his prosthesis to fall off.  Brandon Myers will benefit from the continued use of a microprocessor knee.  He verbally Brandon Myers communicates a strong desire to get a new prosthesis.  Brandon Myers is not currently using any mobility aids is not expected to do so with a new prosthesis.  He is a K3 level ambulator that spends a lot of time walking around on uneven terrain over obstacles, up and down stairs and sometimes has to walk backwards.  Review of Systems     Past Medical History:  Diagnosis Date   Allergy    Anemia 03/03/2022   Anxiety 01/21/2020   Controlled substance agreement signed 04/02/2017   GERD (gastroesophageal reflux disease)    Hypertension    Neutropenia, congenital (HCC)    Phantom limb pain (HCC)     Current Outpatient Medications  Medication Sig Dispense Refill   acetaminophen (TYLENOL) 650 MG CR tablet Take 1,300 mg by mouth 2 (two) times daily as needed for pain.     albuterol (VENTOLIN HFA) 108 (90 Base) MCG/ACT inhaler INHALE 2 PUFFS BY MOUTH FOUR TIMES DAILY AS NEEDED 18 g 12   atorvastatin (LIPITOR) 10 MG tablet TAKE 1 TABLET(10 MG) BY MOUTH DAILY 90 tablet 3   filgrastim-sndz (ZARXIO) 480 MCG/0.8ML SOSY injection Inject 0.8 mLs into the muscle every other day.     gabapentin (NEURONTIN) 300 MG capsule Take 1 capsule (300 mg total) by mouth daily. 90 capsule 0   gabapentin (NEURONTIN) 800 MG tablet TAKE 1  TABLET(800 MG) BY MOUTH THREE TIMES DAILY 270 tablet 0   loratadine (CLARITIN) 10 MG tablet TAKE 1 TABLET(10 MG) BY MOUTH DAILY (Patient taking differently: daily as needed.) 90 tablet 3   losartan (COZAAR) 100 MG tablet TAKE 1 TABLET(100 MG) BY MOUTH DAILY 90 tablet 1   metoprolol succinate (TOPROL-XL) 100 MG 24 hr tablet TAKE 1 TABLET BY MOUTH DAILY FOLLOWING A MEAL 90 tablet 0   nortriptyline (PAMELOR) 25 MG capsule TAKE 1 CAPSULE(25 MG) BY MOUTH AT BEDTIME 90 capsule 0   omeprazole (PRILOSEC) 40 MG capsule TAKE 1 CAPSULE(40 MG) BY MOUTH DAILY 90 capsule 1   tiZANidine (ZANAFLEX) 4 MG tablet Take 1 tablet (4 mg total) by mouth at bedtime as needed for muscle spasms. 90 tablet 1   No current facility-administered medications for this visit.    Allergies  Allergen Reactions   Enalapril Cough    ACEi Cough   Hydrochlorothiazide     Sweats, hot flashes   Prochlorperazine     Other reaction(s): Other (See Comments) As a child unable to recall   Sulfa Antibiotics Other (See Comments)    Other reaction(s): Other (See Comments) As a child unable to recall   Benadryl [Diphenhydramine]     Increased phantom pain   Strawberry Extract Rash    Family History  Problem Relation  Age of Onset   Heart Problems Mother    Asthma Mother    Depression Mother    Thyroid disease Mother    Alzheimer's disease Mother    Hypertension Mother    Stroke Maternal Grandmother    Cancer Maternal Grandmother    Cancer Father        skin   Hypertension Father    Cancer Paternal Grandmother    Cancer Paternal Grandfather     Social History   Socioeconomic History   Marital status: Married    Spouse name: Brandon Myers   Number of children: 2   Years of education: Not on file   Highest education level: Associate degree: occupational, Scientist, product/process development, or vocational program  Occupational History   Occupation: Disabled  Tobacco Use   Smoking status: Never   Smokeless tobacco: Never  Vaping Use   Vaping  Use: Never used  Substance and Sexual Activity   Alcohol use: Never    Alcohol/week: 0.0 standard drinks of alcohol   Drug use: Never   Sexual activity: Never  Other Topics Concern   Not on file  Social History Narrative   Not on file   Social Determinants of Health   Financial Resource Strain: Medium Risk (11/13/2022)   Overall Financial Resource Strain (CARDIA)    Difficulty of Paying Living Expenses: Somewhat hard  Food Insecurity: Food Insecurity Present (11/13/2022)   Hunger Vital Sign    Worried About Running Out of Food in the Last Year: Sometimes true    Ran Out of Food in the Last Year: Sometimes true  Transportation Needs: No Transportation Needs (11/13/2022)   PRAPARE - Administrator, Civil Service (Medical): No    Lack of Transportation (Non-Medical): No  Physical Activity: Unknown (11/13/2022)   Exercise Vital Sign    Days of Exercise per Week: Patient declined    Minutes of Exercise per Session: Not on file  Stress: No Stress Concern Present (11/13/2022)   [employer] of Occupational Health - Occupational Stress Questionnaire    Feeling of Stress : Only a little  Social Connections: Moderately Integrated (11/13/2022)   Social Connection and Isolation Panel [NHANES]    Frequency of Communication with Friends and Family: Once a week    Frequency of Social Gatherings with Friends and Family: Once a week    Attends Religious Services: More than 4 times per year    Active Member of [employer] or Organizations: Yes    Attends Engineer, structural: More than 4 times per year    Marital Status: Married  Catering manager Violence: Not At Risk (10/21/2021)   Humiliation, Afraid, Rape, and Kick questionnaire    Fear of Current or Ex-Partner: No    Emotionally Abused: No    Physically Abused: No    Sexually Abused: No     Constitutional: Denies fever, malaise, fatigue, headache or abrupt weight changes.  Respiratory: Denies difficulty breathing,  shortness of breath, cough or sputum production.   Cardiovascular: Denies chest pain, chest tightness, palpitations or swelling in the hands or feet.  Musculoskeletal: Patient reports difficulty with gait with current prosthesis.  Denies decrease in range of motion, muscle pain or joint pain and swelling.  Skin: Denies redness, rashes, lesions or ulcercations.  Neurological: Denies dizziness, difficulty with memory, difficulty with speech or problems with balance and coordination.  .  No other specific complaints in a complete review of systems (except as listed in HPI above).  Objective:   Physical  Exam  BP (!) 143/95 (BP Location: Left Arm, Patient Position: Sitting, Cuff Size: Large)   Pulse 70   Temp (!) 95.4 F (35.2 C) (Temporal)   Wt 206 lb (93.4 kg)   SpO2 99%   BMI 31.32 kg/m   Wt Readings from Last 3 Encounters:  12/28/22 201 lb (91.2 kg)  11/14/22 208 lb (94.3 kg)  06/13/22 207 lb (93.9 kg)    General: Appears his stated age, obese, in NAD. Skin: Warm, dry and intact. Cardiovascular: Bradycardic with normal rhythm. S1,S2 noted.  No murmur, rubs or gallops noted. No JVD or BLE edema. No carotid bruits noted. Pulmonary/Chest: Normal effort and positive vesicular breath sounds. No respiratory distress. No wheezes, rales or ronchi noted.  Musculoskeletal: Amputation of RUE/RLE.  Prosthesis noted of RLE.  Gait slow, slightly unsteady without device. Neurological: Alert and oriented.   BMET    Component Value Date/Time   NA 139 11/14/2022 1347   NA 139 12/23/2019 1447   K 4.6 11/14/2022 1347   CL 101 11/14/2022 1347   CO2 28 11/14/2022 1347   GLUCOSE 170 (H) 11/14/2022 1347   BUN 16 11/14/2022 1347   BUN 13 12/23/2019 1447   CREATININE 0.83 11/14/2022 1347   CALCIUM 9.9 11/14/2022 1347   GFRNONAA 100 12/23/2019 1447   GFRNONAA >89 12/09/2016 0934   GFRAA 116 12/23/2019 1447   GFRAA >89 12/09/2016 0934    Lipid Panel     Component Value Date/Time   CHOL 99  11/17/2022 0902   CHOL 139 12/23/2019 1447   TRIG 155 (H) 11/17/2022 0902   HDL 24 (L) 11/17/2022 0902   HDL 23 (L) 12/23/2019 1447   CHOLHDL 4.1 11/17/2022 0902   VLDL 29 12/09/2016 0934   LDLCALC 52 11/17/2022 0902    CBC    Component Value Date/Time   WBC 6.2 11/17/2022 0902   RBC 4.71 11/17/2022 0902   HGB 13.1 (L) 11/17/2022 0902   HGB 14.1 11/27/2018 1344   HCT 40.9 11/17/2022 0902   HCT 41.8 11/27/2018 1344   PLT 173 11/17/2022 0902   PLT 200 11/27/2018 1344   MCV 86.8 11/17/2022 0902   MCV 83 11/27/2018 1344   MCH 27.8 11/17/2022 0902   MCHC 32.0 11/17/2022 0902   RDW 12.5 11/17/2022 0902   RDW 13.1 11/27/2018 1344   LYMPHSABS 1.9 11/27/2018 1344   MONOABS 1.4 (H) 10/26/2018 2318   EOSABS 0.5 (H) 11/27/2018 1344   BASOSABS 0.2 11/27/2018 1344    Hgb A1C Lab Results  Component Value Date   HGBA1C 6.2 (H) 11/17/2022           Assessment & Plan:     RTC in 4 months for follow-up of chronic conditions Nicki Reaper, NP

## 2023-01-11 NOTE — Assessment & Plan Note (Signed)
Will fax letter to the [facility] clinic requesting a new prosthesis

## 2023-01-11 NOTE — Patient Instructions (Signed)
Stump and Prosthesis Care When an arm or leg is removed, it is important to care for: The artificial body part that replaces the arm or leg (prosthesis). The remaining end of the arm or leg (stump). Caring for the stump and prosthesis will help you stay comfortable, active, and healthy. How to care for your stump Cleaning your skin Wash your stump with a mild antibacterial soap at least once each day. Wash your stump after getting dirty or sweaty. After washing your stump, pat it dry and let it air-dry for 5-10 minutes. Do not soak your stump in a warm or hot bath for longer than 20 minutes at a time. Avoid shaving hair on the stump. Hair that grows out after being shaved is more easily irritated by the prosthesis. Using skincare products Apply ointment to your surgical scar if your health care provider told you to do so. This can keep the scar soft and help it heal. Do not put creams and lotions on your stump unless told by your health care provider. If your health care provider says it is okay to put creams and lotions on your stump, do not use lotions that contain petroleum jelly. Do not use skincare products with an alcohol base. These products can be harmful to your skin. They can also damage the lining of the prosthesis. Consider using an antiperspirant spray on the skin of the stump if you tend to sweat. Other instructions Check the skin on your stump daily. To do this: Use a mirror with a long handle to check areas you cannot see, or ask a friend or family member to check those areas. Look for areas that appear reddish, swollen, or irritated. Pay extra attention to places where the stump and prosthesis rub together. Tell your health care provider if you have concerns. Wear the elastic wrap on your stump as told by your health care provider. The wrap may become loose or your stump may swell when you are active. Rewrap the bandage every couple of hours as needed. How to care for your  prosthesis Cleaning your prosthesis Use hot water and antibacterial soap to wash your prosthesis. Dry the prosthesis using a clean towel. Attaching your prosthesis Make sure all parts of your prosthesis that touch the skin are clean and dry before you attach it to your stump. Wear socks or wraps under the prosthesis. Attach your prosthesis to your stump. Be sure you understand how to attach it. A prosthetic specialist (prosthetist) can show you how to do this. It is a good idea to practice several times while he or she watches. Other instructions     Exercise and move your prosthesis as told by your physical therapist. Follow your health care provider's instructions about the length of time you should wear your prosthesis. You may be instructed to: Limit the amount of time you wear your prosthesis at first. Increase the time you wear your prosthesis a little each day. Where to find more information The Amputee Coalition: www.amputee-coalition.org/ Contact a health care provider if: The prosthesis does not seem to fit correctly. You have an itchy rash, redness or a sore on your stump. There is a lot of sweat between the stump and the prosthesis, and efforts to control the sweating do not work. Your stump is painful to the touch, or it is red, swollen, or hot. A bad smell develops around the stump. There is a sore on your stump that is not healing. There is drainage coming from  your stump. Get help right away if: Your stump is colder than the upper part of your limb. Skin on your stump turns gray or black. Summary When an arm or leg is removed, it is important to care for the prosthesis and the stump. Wash your stump with a mild antibacterial soap at least once each day or if it gets dirty or sweaty. Then, pat it dry and let it air-dry for 5-10 minutes. Follow your health care provider's instructions on the use of skin care products on your stump. Every day, closely check the skin on  your stump. Look for areas that appear reddish, swollen, or irritated. Tell your health care provider if you have concerns. Make sure your prosthesis is clean before you attach it to your stump. Clean and dry all the parts that touch your skin. This information is not intended to replace advice given to you by your health care provider. Make sure you discuss any questions you have with your health care provider. Document Revised: 12/03/2020 Document Reviewed: 12/03/2020 Elsevier Patient Education  2024 [employer].

## 2023-01-21 ENCOUNTER — Other Ambulatory Visit: Payer: Self-pay | Admitting: Internal Medicine

## 2023-01-23 NOTE — Telephone Encounter (Signed)
Requested Prescriptions  Pending Prescriptions Disp Refills   metoprolol succinate (TOPROL-XL) 100 MG 24 hr tablet [Pharmacy Med Name: METOPROLOL ER SUCCINATE 100MG  TABS] 90 tablet 0    Sig: TAKE 1 TABLET BY MOUTH EVERY DAY AFTER A MEAL     Cardiovascular:  Beta Blockers Failed - 01/21/2023  9:34 AM      Failed - Last BP in normal range    BP Readings from Last 1 Encounters:  01/11/23 (!) 143/95         Passed - Last Heart Rate in normal range    Pulse Readings from Last 1 Encounters:  01/11/23 70         Passed - Valid encounter within last 6 months    Recent Outpatient Visits           1 week ago 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[facility])   [place] [facility] [place], Salvadore Oxford, NP   2 months ago Encounter for general adult medical examination with abnormal findings   [place] [facility] [place], Salvadore Oxford, NP   7 months ago Other infective acute otitis externa of right ear   [place] [facility] [place], Salvadore Oxford, NP   10 months ago Acute nasopharyngitis   [place] [facility] [place], Salvadore Oxford, NP   10 months ago Pure hypertriglyceridemia   [place] [facility] [place], Salvadore Oxford, NP       Future Appointments             In 3 months Baity, Salvadore Oxford, NP [place] [facility], [facility]

## 2023-01-31 ENCOUNTER — Other Ambulatory Visit: Payer: Self-pay | Admitting: Internal Medicine

## 2023-01-31 DIAGNOSIS — G546 Phantom limb syndrome with pain: Secondary | ICD-10-CM

## 2023-02-01 NOTE — Telephone Encounter (Signed)
Requested Prescriptions  Pending Prescriptions Disp Refills   gabapentin (NEURONTIN) 800 MG tablet [Pharmacy Med Name: GABAPENTIN 800MG  TABLETS] 270 tablet 0    Sig: TAKE 1 TABLET(800 MG) BY MOUTH THREE TIMES DAILY     Neurology: Anticonvulsants - gabapentin Passed - 01/31/2023 10:48 AM      Passed - Cr in normal range and within 360 days    Creat  Date Value Ref Range Status  11/14/2022 0.83 0.70 - 1.30 mg/dL Final         Passed - Completed PHQ-2 or PHQ-9 in the last 360 days      Passed - Valid encounter within last 12 months    Recent Outpatient Visits           3 weeks ago 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[facility])   [place] [facility] [place], Salvadore Oxford, NP   2 months ago Encounter for general adult medical examination with abnormal findings   [place] [facility] [place], Salvadore Oxford, NP   7 months ago Other infective acute otitis externa of right ear   [place] [facility] [place], Salvadore Oxford, NP   10 months ago Acute nasopharyngitis   [place] [facility] [place], Salvadore Oxford, NP   11 months ago Pure hypertriglyceridemia   [place] [facility] [place], Salvadore Oxford, NP       Future Appointments             In 3 months Baity, Salvadore Oxford, NP [place] [facility], [facility]

## 2023-02-07 ENCOUNTER — Other Ambulatory Visit: Payer: Self-pay | Admitting: Internal Medicine

## 2023-02-07 DIAGNOSIS — I10 Essential (primary) hypertension: Secondary | ICD-10-CM

## 2023-02-08 NOTE — Telephone Encounter (Signed)
Requested Prescriptions  Pending Prescriptions Disp Refills   nortriptyline (PAMELOR) 25 MG capsule [Pharmacy Med Name: NORTRIPTYLINE 25MG  CAPSULES] 90 capsule 0    Sig: TAKE 1 CAPSULE(25 MG) BY MOUTH AT BEDTIME     Psychiatry:  Antidepressants - Heterocyclics (TCAs) Passed - 02/07/2023  1:05 PM      Passed - Valid encounter within last 6 months    Recent Outpatient Visits           4 weeks ago 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[facility])   [place] [facility] [place], Salvadore Oxford, NP   2 months ago Encounter for general adult medical examination with abnormal findings   [place] [facility] [place], Salvadore Oxford, NP   8 months ago Other infective acute otitis externa of right ear   [place] [facility] [place], Salvadore Oxford, NP   10 months ago Acute nasopharyngitis   [place] [facility] [place], Salvadore Oxford, NP   11 months ago Pure hypertriglyceridemia   [place] [facility] [place], Salvadore Oxford, NP       Future Appointments             In 3 months [place], Salvadore Oxford, NP [place] [facility]             losartan (COZAAR) 100 MG tablet [Pharmacy Med Name: LOSARTAN 100MG  TABLETS] 90 tablet 0    Sig: TAKE 1 TABLET(100 MG) BY MOUTH DAILY     Cardiovascular:  Angiotensin Receptor Blockers Failed - 02/07/2023  1:05 PM      Failed - Last BP in normal range    BP Readings from Last 1 Encounters:  01/11/23 (!) 143/95         Passed - Cr in normal range and within 180 days    Creat  Date Value Ref Range Status  11/14/2022 0.83 0.70 - 1.30 mg/dL Final         Passed - K in normal range and within 180 days    Potassium  Date Value Ref Range Status  11/14/2022 4.6 3.5 - 5.3 mmol/L Final         Passed - Patient is not pregnant      Passed - Valid encounter within last 6 months    Recent Outpatient Visits           4 weeks ago 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[facility])   [place] [facility] [place],  Salvadore Oxford, NP   2 months ago Encounter for general adult medical examination with abnormal findings   [place] [facility] [place], Salvadore Oxford, NP   8 months ago Other infective acute otitis externa of right ear   [place] [facility] [place], Salvadore Oxford, NP   10 months ago Acute nasopharyngitis   [place] [facility] [place], Salvadore Oxford, NP   11 months ago Pure hypertriglyceridemia   [place] [facility] [place], Salvadore Oxford, NP       Future Appointments             In 3 months Baity, Salvadore Oxford, NP [place] [facility], [facility]

## 2023-03-06 ENCOUNTER — Telehealth: Payer: Self-pay | Admitting: Internal Medicine

## 2023-03-06 NOTE — Telephone Encounter (Signed)
Mindi Junker from [facility] called stated they are needing medical records from 6/12 for patients prothesis. She request that it be faxed to [phone]. For further info please contact Mindi Junker at [phone].

## 2023-03-15 ENCOUNTER — Other Ambulatory Visit: Payer: Self-pay | Admitting: Internal Medicine

## 2023-03-15 DIAGNOSIS — G546 Phantom limb syndrome with pain: Secondary | ICD-10-CM

## 2023-03-16 NOTE — Telephone Encounter (Signed)
Requested Prescriptions  Pending Prescriptions Disp Refills   gabapentin (NEURONTIN) 300 MG capsule [Pharmacy Med Name: GABAPENTIN 300MG  CAPSULES] 90 capsule 0    Sig: TAKE 1 CAPSULE(300 MG) BY MOUTH DAILY     Neurology: Anticonvulsants - gabapentin Passed - 03/15/2023  2:07 AM      Passed - Cr in normal range and within 360 days    Creat  Date Value Ref Range Status  11/14/2022 0.83 0.70 - 1.30 mg/dL Final         Passed - Completed PHQ-2 or PHQ-9 in the last 360 days      Passed - Valid encounter within last 12 months    Recent Outpatient Visits           2 months ago 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[facility])   [place] [facility] [place], Salvadore Oxford, NP   4 months ago Encounter for general adult medical examination with abnormal findings   [place] [facility] [place], Salvadore Oxford, NP   9 months ago Other infective acute otitis externa of right ear   [place] [facility] [place], Salvadore Oxford, NP   11 months ago Acute nasopharyngitis   [place] [facility] [place], Salvadore Oxford, NP   1 year ago Pure hypertriglyceridemia   [place] [facility] [place], Salvadore Oxford, NP       Future Appointments             In 2 months Baity, Salvadore Oxford, NP [place] [facility], [facility]

## 2023-04-13 DIAGNOSIS — Z89611 Acquired absence of right leg above knee: Secondary | ICD-10-CM | POA: Diagnosis not present

## 2023-04-20 ENCOUNTER — Other Ambulatory Visit: Payer: Self-pay | Admitting: Internal Medicine

## 2023-04-21 NOTE — Telephone Encounter (Signed)
Requested Prescriptions  Pending Prescriptions Disp Refills   metoprolol succinate (TOPROL-XL) 100 MG 24 hr tablet [Pharmacy Med Name: METOPROLOL ER SUCCINATE 100MG  TABS] 90 tablet 0    Sig: TAKE 1 TABLET BY MOUTH EVERY DAY AFTER A MEAL     Cardiovascular:  Beta Blockers Failed - 04/20/2023 11:49 AM      Failed - Last BP in normal range    BP Readings from Last 1 Encounters:  01/11/23 (!) 143/95         Passed - Last Heart Rate in normal range    Pulse Readings from Last 1 Encounters:  01/11/23 70         Passed - Valid encounter within last 6 months    Recent Outpatient Visits           3 months ago 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[facility])   [place] [facility] [place], Salvadore Oxford, NP   5 months ago Encounter for general adult medical examination with abnormal findings   [place] [facility] [place], Salvadore Oxford, NP   10 months ago Other infective acute otitis externa of right ear   [place] [facility] [place], Salvadore Oxford, NP   1 year ago Acute nasopharyngitis   [place] [facility] [place], Salvadore Oxford, NP   1 year ago Pure hypertriglyceridemia   [place] [facility] [place], Salvadore Oxford, NP       Future Appointments             In 3 weeks [place], Salvadore Oxford, NP [place] [facility], [facility]

## 2023-04-22 ENCOUNTER — Other Ambulatory Visit: Payer: Self-pay | Admitting: Internal Medicine

## 2023-04-22 DIAGNOSIS — K219 Gastro-esophageal reflux disease without esophagitis: Secondary | ICD-10-CM

## 2023-04-25 NOTE — Telephone Encounter (Signed)
Requested Prescriptions  Pending Prescriptions Disp Refills   omeprazole (PRILOSEC) 40 MG capsule [Pharmacy Med Name: OMEPRAZOLE 40MG  CAPSULES] 90 capsule 1    Sig: TAKE 1 CAPSULE(40 MG) BY MOUTH DAILY     Gastroenterology: Proton Pump Inhibitors Passed - 04/22/2023 12:47 PM      Passed - Valid encounter within last 12 months    Recent Outpatient Visits           3 months ago 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[facility])   [place] [facility] [place], Salvadore Oxford, NP   5 months ago Encounter for general adult medical examination with abnormal findings   [place] [facility] [place], Salvadore Oxford, NP   10 months ago Other infective acute otitis externa of right ear   [place] [facility] [place], Salvadore Oxford, NP   1 year ago Acute nasopharyngitis   [place] [facility] [place], Salvadore Oxford, NP   1 year ago Pure hypertriglyceridemia   [place] [facility] [place], Salvadore Oxford, NP       Future Appointments             In 3 weeks Sampson Si, Salvadore Oxford, NP  [facility], [facility]

## 2023-04-29 ENCOUNTER — Other Ambulatory Visit: Payer: Self-pay | Admitting: Internal Medicine

## 2023-04-29 DIAGNOSIS — G546 Phantom limb syndrome with pain: Secondary | ICD-10-CM

## 2023-05-01 NOTE — Telephone Encounter (Signed)
Requested Prescriptions  Pending Prescriptions Disp Refills   gabapentin (NEURONTIN) 800 MG tablet [Pharmacy Med Name: GABAPENTIN 800MG  TABLETS] 270 tablet 0    Sig: TAKE 1 TABLET(800 MG) BY MOUTH THREE TIMES DAILY     Neurology: Anticonvulsants - gabapentin Passed - 04/29/2023  1:09 PM      Passed - Cr in normal range and within 360 days    Creat  Date Value Ref Range Status  11/14/2022 0.83 0.70 - 1.30 mg/dL Final         Passed - Completed PHQ-2 or PHQ-9 in the last 360 days      Passed - Valid encounter within last 12 months    Recent Outpatient Visits           3 months ago 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[facility])   [place] [facility] [place], Salvadore Oxford, NP   5 months ago Encounter for general adult medical examination with abnormal findings   [place] [facility] [place], Salvadore Oxford, NP   10 months ago Other infective acute otitis externa of right ear   [place] [facility], Salvadore Oxford, NP   1 year ago Acute nasopharyngitis   [place] [facility] [place], Salvadore Oxford, NP   1 year ago Pure hypertriglyceridemia   [place] [facility] [place], Salvadore Oxford, NP       Future Appointments             In 2 weeks Sam[place] Si, Salvadore Oxford, NP [place] [facility], [facility]

## 2023-05-04 DIAGNOSIS — D700 Congenital agranulocytosis: Secondary | ICD-10-CM | POA: Diagnosis not present

## 2023-05-08 ENCOUNTER — Other Ambulatory Visit: Payer: Self-pay | Admitting: Internal Medicine

## 2023-05-08 DIAGNOSIS — K219 Gastro-esophageal reflux disease without esophagitis: Secondary | ICD-10-CM

## 2023-05-09 ENCOUNTER — Ambulatory Visit (INDEPENDENT_AMBULATORY_CARE_PROVIDER_SITE_OTHER): Payer: Medicare Other | Admitting: Internal Medicine

## 2023-05-09 ENCOUNTER — Encounter: Payer: Self-pay | Admitting: Internal Medicine

## 2023-05-09 VITALS — BP 122/84 | HR 143 | Temp 95.4°F | Wt 207.0 lb

## 2023-05-09 DIAGNOSIS — F419 Anxiety disorder, unspecified: Secondary | ICD-10-CM

## 2023-05-09 DIAGNOSIS — G546 Phantom limb syndrome with pain: Secondary | ICD-10-CM

## 2023-05-09 DIAGNOSIS — R000 Tachycardia, unspecified: Secondary | ICD-10-CM

## 2023-05-09 DIAGNOSIS — R7303 Prediabetes: Secondary | ICD-10-CM | POA: Diagnosis not present

## 2023-05-09 DIAGNOSIS — E781 Pure hyperglyceridemia: Secondary | ICD-10-CM | POA: Diagnosis not present

## 2023-05-09 DIAGNOSIS — T402X5A Adverse effect of other opioids, initial encounter: Secondary | ICD-10-CM

## 2023-05-09 DIAGNOSIS — I10 Essential (primary) hypertension: Secondary | ICD-10-CM

## 2023-05-09 DIAGNOSIS — E6609 Other obesity due to excess calories: Secondary | ICD-10-CM

## 2023-05-09 DIAGNOSIS — K5903 Drug induced constipation: Secondary | ICD-10-CM | POA: Diagnosis not present

## 2023-05-09 DIAGNOSIS — K219 Gastro-esophageal reflux disease without esophagitis: Secondary | ICD-10-CM | POA: Diagnosis not present

## 2023-05-09 DIAGNOSIS — Z23 Encounter for immunization: Secondary | ICD-10-CM

## 2023-05-09 DIAGNOSIS — G894 Chronic pain syndrome: Secondary | ICD-10-CM | POA: Diagnosis not present

## 2023-05-09 DIAGNOSIS — E66811 Obesity, class 1: Secondary | ICD-10-CM

## 2023-05-09 DIAGNOSIS — Z6831 Body mass index (BMI) 31.0-31.9, adult: Secondary | ICD-10-CM

## 2023-05-09 MED ORDER — NAPROXEN 500 MG PO TABS: 500.0000 mg | ORAL_TABLET | Freq: Two times a day (BID) | ORAL | 0 refills | Status: DC

## 2023-05-09 MED ORDER — METOPROLOL SUCCINATE ER 100 MG PO TB24: ORAL_TABLET | ORAL | 1 refills | Status: DC

## 2023-05-09 MED ORDER — OMEPRAZOLE 40 MG PO CPDR
40.0000 mg | DELAYED_RELEASE_CAPSULE | Freq: Every day | ORAL | 1 refills | Status: DC
Start: 2023-05-09 — End: 2023-11-03

## 2023-05-09 NOTE — Assessment & Plan Note (Signed)
Not medicated Will monitor 

## 2023-05-09 NOTE — Telephone Encounter (Signed)
Requested Prescriptions  Pending Prescriptions Disp Refills   omeprazole (PRILOSEC) 40 MG capsule [Pharmacy Med Name: OMEPRAZOLE 40MG  CAPSULES] 90 capsule 1    Sig: TAKE 1 CAPSULE(40 MG) BY MOUTH DAILY     Gastroenterology: Proton Pump Inhibitors Passed - 05/08/2023 11:51 AM      Passed - Valid encounter within last 12 months    Recent Outpatient Visits           3 months ago 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[facility])   [facility] [place], Salvadore Oxford, NP   5 months ago Encounter for general adult medical examination with abnormal findings   [place] [facility] [place], Salvadore Oxford, NP   11 months ago Other infective acute otitis externa of right ear   [place] [facility] [place], Salvadore Oxford, NP   1 year ago Acute nasopharyngitis   [facility] [place], Salvadore Oxford, NP   1 year ago Pure hypertriglyceridemia   [place] [facility] [place], Salvadore Oxford, NP       Future Appointments             Today Sampson Si, Salvadore Oxford, NP [place] [facility]   In 1 week [place], Salvadore Oxford, NP [facility], [facility]             nortriptyline (PAMELOR) 25 MG capsule [Pharmacy Med Name: NORTRIPTYLINE 25MG  CAPSULES] 90 capsule 0    Sig: TAKE 1 CAPSULE(25 MG) BY MOUTH AT BEDTIME     Psychiatry:  Antidepressants - Heterocyclics (TCAs) Passed - 05/08/2023 11:51 AM      Passed - Valid encounter within last 6 months    Recent Outpatient Visits           3 months ago 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[facility])   [place] [facility] [place], Salvadore Oxford, NP   5 months ago Encounter for general adult medical examination with abnormal findings   [place] [facility] [place], Salvadore Oxford, NP   11 months ago Other infective acute otitis externa of right ear   [place] [facility] [place], Salvadore Oxford, NP   1 year ago Acute nasopharyngitis   [place] [facility] [place], Salvadore Oxford, NP   1 year ago Pure hypertriglyceridemia   [place] [facility] [place], Salvadore Oxford, NP       Future Appointments             Today Sampson Si, Salvadore Oxford, NP  [facility], [facility]   In 1 week [place], Salvadore Oxford, NP [facility], [facility]

## 2023-05-09 NOTE — Assessment & Plan Note (Signed)
Continue omeprazole 

## 2023-05-09 NOTE — Assessment & Plan Note (Signed)
Continue tizanidine, nortriptyline and gabapentin as prescribed by pain management

## 2023-05-09 NOTE — Assessment & Plan Note (Signed)
CMET and lipid profile today Continue atorvastatin Encouraged him to consume a low fat diet

## 2023-05-09 NOTE — Patient Instructions (Signed)

## 2023-05-09 NOTE — Assessment & Plan Note (Signed)
Blood pressure controlled but heart rate elevated, however he reports he has been out of his metoprolol Continue losartan and metoprolol, refilled today Reinforced DASH diet and exercise for weight loss C-Met today

## 2023-05-09 NOTE — Progress Notes (Signed)
Subjective:    Patient ID: Brandon Myers, male    DOB: Jul 16, 1966, 57 y.o.   MRN: [id number]  HPI  Patient presents to clinic today for 42-month follow-up of chronic conditions.  HTN: His BP today is 12284.  He is taking losartan and metoprolol as prescribed but reports he ran out of metoprolol a few days ago.  ECG from 09/2018 reviewed.  HLD: His last LDL was 52, triglycerides 409, 10/2022.  He denies myalgias on atorvastatin.  He does not consume a low-fat diet.  GERD with history of esophageal stricture: He has intermittent dysphagia. He denies breakthrough on omeprazole.  There is no upper GI on file but he reports he had his esophagus stretched last year.  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  He is taking  tizanidine, nortriptyline and gabapentin as prescribed.  He follows with pain management.  Anxiety: Currently not an issue.  He is not taking any medication for this.  He is not seeing a therapist.  He denies depression, SI/HI.  Constipation: Intermittent, managed with laxative OTC as needed.  There is no colonoscopy on file  Prediabetes: His last A1c was 6.2%, 10/2022.  He is not taking any oral diabetic medication at this time.  He does not check his sugars.  He also reports left arm pain. He reports this started about 6 months ago. He reports that pain has been intermittent. He describes the pain as achy but can be sharp with certain with movements.  He denies numbness, tingling or weakness of the left upper extremity.  Review of Systems     Past Medical History:  Diagnosis Date   Allergy    Anemia 03/03/2022   Anxiety 01/21/2020   Controlled substance agreement signed 04/02/2017   GERD (gastroesophageal reflux disease)    Hypertension    Neutropenia, congenital (HCC)    Phantom limb pain (HCC)     Current Outpatient Medications  Medication Sig Dispense Refill   acetaminophen (TYLENOL) 650 MG CR tablet Take 1,300 mg by  mouth 2 (two) times daily as needed for pain.     albuterol (VENTOLIN HFA) 108 (90 Base) MCG/ACT inhaler INHALE 2 PUFFS BY MOUTH FOUR TIMES DAILY AS NEEDED 18 g 12   atorvastatin (LIPITOR) 10 MG tablet TAKE 1 TABLET(10 MG) BY MOUTH DAILY 90 tablet 3   filgrastim-sndz (ZARXIO) 480 MCG/0.8ML SOSY injection Inject 0.8 mLs into the muscle every other day.     gabapentin (NEURONTIN) 300 MG capsule TAKE 1 CAPSULE(300 MG) BY MOUTH DAILY 90 capsule 0   gabapentin (NEURONTIN) 800 MG tablet TAKE 1 TABLET(800 MG) BY MOUTH THREE TIMES DAILY 270 tablet 0   loratadine (CLARITIN) 10 MG tablet TAKE 1 TABLET(10 MG) BY MOUTH DAILY (Patient taking differently: daily as needed.) 90 tablet 3   losartan (COZAAR) 100 MG tablet TAKE 1 TABLET(100 MG) BY MOUTH DAILY 90 tablet 0   metoprolol succinate (TOPROL-XL) 100 MG 24 hr tablet TAKE 1 TABLET BY MOUTH EVERY DAY AFTER A MEAL 90 tablet 0   nortriptyline (PAMELOR) 25 MG capsule TAKE 1 CAPSULE(25 MG) BY MOUTH AT BEDTIME 90 capsule 0   omeprazole (PRILOSEC) 40 MG capsule TAKE 1 CAPSULE(40 MG) BY MOUTH DAILY 90 capsule 1   tiZANidine (ZANAFLEX) 4 MG tablet Take 1 tablet (4 mg total) by mouth at bedtime as needed for muscle spasms. 90 tablet 1   No current facility-administered medications for this visit.    Allergies  Allergen  Reactions   Enalapril Cough    ACEi Cough   Hydrochlorothiazide     Sweats, hot flashes   Prochlorperazine     Other reaction(s): Other (See Comments) As a child unable to recall   Sulfa Antibiotics Other (See Comments)    Other reaction(s): Other (See Comments) As a child unable to recall   Benadryl [Diphenhydramine]     Increased phantom pain   Strawberry Extract Rash    Family History  Problem Relation Age of Onset   Heart Problems Mother    Asthma Mother    Depression Mother    Thyroid disease Mother    Alzheimer's disease Mother    Hypertension Mother    Stroke Maternal Grandmother    Cancer Maternal Grandmother    Cancer  Father        skin   Hypertension Father    Cancer Paternal Grandmother    Cancer Paternal Grandfather     Social History   Socioeconomic History   Marital status: Married    Spouse name: Acel Natzke   Number of children: 2   Years of education: Not on file   Highest education level: Associate degree: occupational, Scientist, product/process development, or vocational program  Occupational History   Occupation: Disabled  Tobacco Use   Smoking status: Never   Smokeless tobacco: Never  Vaping Use   Vaping status: Never Used  Substance and Sexual Activity   Alcohol use: Never    Alcohol/week: 0.0 standard drinks of alcohol   Drug use: Never   Sexual activity: Never  Other Topics Concern   Not on file  Social History Narrative   Not on file   Social Determinants of Health   Financial Resource Strain: Medium Risk (11/13/2022)   Overall Financial Resource Strain (CARDIA)    Difficulty of Paying Living Expenses: Somewhat hard  Food Insecurity: Food Insecurity Present (11/13/2022)   Hunger Vital Sign    Worried About Running Out of Food in the Last Year: Sometimes true    Ran Out of Food in the Last Year: Sometimes true  Transportation Needs: No Transportation Needs (11/13/2022)   PRAPARE - Administrator, Civil Service (Medical): No    Lack of Transportation (Non-Medical): No  Physical Activity: Unknown (11/13/2022)   Exercise Vital Sign    Days of Exercise per Week: Patient declined    Minutes of Exercise per Session: Not on file  Stress: No Stress Concern Present (11/13/2022)   [employer] of Occupational Health - Occupational Stress Questionnaire    Feeling of Stress : Only a little  Social Connections: Moderately Integrated (11/13/2022)   Social Connection and Isolation Panel [NHANES]    Frequency of Communication with Friends and Family: Once a week    Frequency of Social Gatherings with Friends and Family: Once a week    Attends Religious Services: More than 4 times per year     Active Member of [employer] or Organizations: Yes    Attends Engineer, structural: More than 4 times per year    Marital Status: Married  Catering manager Violence: Not At Risk (10/21/2021)   Humiliation, Afraid, Rape, and Kick questionnaire    Fear of Current or Ex-Partner: No    Emotionally Abused: No    Physically Abused: No    Sexually Abused: No     Constitutional: Denies fever, malaise, fatigue, headache or abrupt weight changes.  HEENT: Denies eye pain, eye redness, ear pain, ringing in the ears, wax buildup, runny nose,  nasal congestion, bloody nose, or sore throat. Respiratory: Denies difficulty breathing, shortness of breath, cough or sputum production.   Cardiovascular: Denies chest pain, chest tightness, palpitations or swelling in the hands or feet.  Gastrointestinal: Patient reports intermittent constipation.  Denies abdominal pain, bloating, diarrhea or blood in the stool.  GU: Denies urgency, frequency, pain with urination, burning sensation, blood in urine, odor or discharge. Musculoskeletal: Patient reports chronic joint pain, left arm pain..  Denies decrease in range of motion, difficulty with gait, muscle pain or joint swelling.  Skin: Denies redness, rashes, lesions or ulcercations.  Neurological: Patient reports neuropathic pain.  Denies dizziness, difficulty with memory, difficulty with speech or problems with balance and coordination.  Psych: Patient has a history of anxiety.  Denies depression, SI/HI.  No other specific complaints in a complete review of systems (except as listed in HPI above).  Objective:   Physical Exam   BP 122/84 (BP Location: Left Arm, Patient Position: Sitting, Cuff Size: Normal)   Pulse (!) 143   Temp (!) 95.4 F (35.2 C) (Temporal)   Wt 207 lb (93.9 kg)   SpO2 98%   BMI 31.47 kg/m   Wt Readings from Last 3 Encounters:  01/11/23 206 lb (93.4 kg)  12/28/22 201 lb (91.2 kg)  11/14/22 208 lb (94.3 kg)    General: Appears his  stated age, obese, in NAD. Skin: Warm and clammy. HEENT: Head: normal shape and size; Eyes: sclera white, no icterus, conjunctiva pink, PERRLA and EOMs intact;  Cardiovascular: Tachycardic with rhythm. S1,S2 noted.  No murmur, rubs or gallops noted. No JVD or LLE edema. No carotid bruits noted. Pulmonary/Chest: Normal effort and positive vesicular breath sounds. No respiratory distress. No wheezes, rales or ronchi noted.  Abdomen: Soft and nontender. Normal bowel sounds.  Musculoskeletal: Normal flexion, extension and rotation of the left elbow.  Pain with palpation over the medial epicondyle.  Gait slow and steady with use of RLE prosthesis without assistive device. Neurological: Alert and oriented. Coordination normal.  Psychiatric: Mood and affect normal. Behavior is normal. Judgment and thought content normal.    BMET    Component Value Date/Time   NA 139 11/14/2022 1347   NA 139 12/23/2019 1447   K 4.6 11/14/2022 1347   CL 101 11/14/2022 1347   CO2 28 11/14/2022 1347   GLUCOSE 170 (H) 11/14/2022 1347   BUN 16 11/14/2022 1347   BUN 13 12/23/2019 1447   CREATININE 0.83 11/14/2022 1347   CALCIUM 9.9 11/14/2022 1347   GFRNONAA 100 12/23/2019 1447   GFRNONAA >89 12/09/2016 0934   GFRAA 116 12/23/2019 1447   GFRAA >89 12/09/2016 0934    Lipid Panel     Component Value Date/Time   CHOL 99 11/17/2022 0902   CHOL 139 12/23/2019 1447   TRIG 155 (H) 11/17/2022 0902   HDL 24 (L) 11/17/2022 0902   HDL 23 (L) 12/23/2019 1447   CHOLHDL 4.1 11/17/2022 0902   VLDL 29 12/09/2016 0934   LDLCALC 52 11/17/2022 0902    CBC    Component Value Date/Time   WBC 6.2 11/17/2022 0902   RBC 4.71 11/17/2022 0902   HGB 13.1 (L) 11/17/2022 0902   HGB 14.1 11/27/2018 1344   HCT 40.9 11/17/2022 0902   HCT 41.8 11/27/2018 1344   PLT 173 11/17/2022 0902   PLT 200 11/27/2018 1344   MCV 86.8 11/17/2022 0902   MCV 83 11/27/2018 1344   MCH 27.8 11/17/2022 0902   MCHC 32.0 11/17/2022 0902  RDW  12.5 11/17/2022 0902   RDW 13.1 11/27/2018 1344   LYMPHSABS 1.9 11/27/2018 1344   MONOABS 1.4 (H) 10/26/2018 2318   EOSABS 0.5 (H) 11/27/2018 1344   BASOSABS 0.2 11/27/2018 1344    Hgb A1C Lab Results  Component Value Date   HGBA1C 6.2 (H) 11/17/2022           Assessment & Plan:   Tachycardia:  Indication for ECG: Tachycardia Interpretation of ECG: Sinus tachycardia Comparison of ECG: 09/2018, sinus tachycardia  Medial epicondylitis:   Rx for naproxen 500 mg twice daily x 7 days  Encouraged rest and bracing   RTC in 6 months for your annual exam Nicki Reaper, NP

## 2023-05-09 NOTE — Assessment & Plan Note (Signed)
Encouraged diet and exercise for weight loss ?

## 2023-05-09 NOTE — Assessment & Plan Note (Signed)
Continue laxatives as needed Encouraged high fiber diet and adequate water intake

## 2023-05-09 NOTE — Assessment & Plan Note (Signed)
Encouraged low carb diet A1C today

## 2023-05-10 LAB — COMPLETE METABOLIC PANEL WITH GFR
AG Ratio: 1.5 (calc) (ref 1.0–2.5)
ALT: 35 U/L (ref 9–46)
AST: 27 U/L (ref 10–35)
Albumin: 4.6 g/dL (ref 3.6–5.1)
Alkaline phosphatase (APISO): 154 U/L — ABNORMAL HIGH (ref 35–144)
BUN: 9 mg/dL (ref 7–25)
CO2: 25 mmol/L (ref 20–32)
Calcium: 9.7 mg/dL (ref 8.6–10.3)
Chloride: 99 mmol/L (ref 98–110)
Creat: 0.79 mg/dL (ref 0.70–1.30)
Globulin: 3.1 g/dL (ref 1.9–3.7)
Glucose, Bld: 200 mg/dL — ABNORMAL HIGH (ref 65–139)
Potassium: 4.2 mmol/L (ref 3.5–5.3)
Sodium: 136 mmol/L (ref 135–146)
Total Bilirubin: 0.6 mg/dL (ref 0.2–1.2)
Total Protein: 7.7 g/dL (ref 6.1–8.1)
eGFR: 105 mL/min/{1.73_m2} (ref >=60)

## 2023-05-10 LAB — HEMOGLOBIN A1C
Hgb A1c MFr Bld: 6.1 %{Hb} — ABNORMAL HIGH (ref <5.7)
Mean Plasma Glucose: 128 mg/dL
eAG (mmol/L): 7.1 mmol/L

## 2023-05-10 LAB — LIPID PANEL
Cholesterol: 97 mg/dL (ref <200)
HDL: 25 mg/dL — ABNORMAL LOW (ref >=40)
LDL Cholesterol (Calc): 46 mg/dL
Non-HDL Cholesterol (Calc): 72 mg/dL (ref <130)
Total CHOL/HDL Ratio: 3.9 (calc) (ref <5.0)
Triglycerides: 182 mg/dL — ABNORMAL HIGH (ref <150)

## 2023-05-10 LAB — CBC
HCT: 46.4 % (ref 38.5–50.0)
Hemoglobin: 14.6 g/dL (ref 13.2–17.1)
MCH: 26.7 pg — ABNORMAL LOW (ref 27.0–33.0)
MCHC: 31.5 g/dL — ABNORMAL LOW (ref 32.0–36.0)
MCV: 84.8 fL (ref 80.0–100.0)
MPV: 10.6 fL (ref 7.5–12.5)
Platelets: 210 10*3/uL (ref 140–400)
RBC: 5.47 10*6/uL (ref 4.20–5.80)
RDW: 13.8 % (ref 11.0–15.0)
WBC: 6.6 10*3/uL (ref 3.8–10.8)

## 2023-05-14 ENCOUNTER — Other Ambulatory Visit: Payer: Self-pay | Admitting: Internal Medicine

## 2023-05-14 DIAGNOSIS — I10 Essential (primary) hypertension: Secondary | ICD-10-CM

## 2023-05-15 NOTE — Telephone Encounter (Signed)
Requested Prescriptions  Pending Prescriptions Disp Refills   losartan (COZAAR) 100 MG tablet [Pharmacy Med Name: LOSARTAN 100MG  TABLETS] 90 tablet 0    Sig: TAKE 1 TABLET(100 MG) BY MOUTH DAILY     Cardiovascular:  Angiotensin Receptor Blockers Passed - 05/14/2023 12:34 AM      Passed - Cr in normal range and within 180 days    Creat  Date Value Ref Range Status  05/09/2023 0.79 0.70 - 1.30 mg/dL Final         Passed - K in normal range and within 180 days    Potassium  Date Value Ref Range Status  05/09/2023 4.2 3.5 - 5.3 mmol/L Final         Passed - Patient is not pregnant      Passed - Last BP in normal range    BP Readings from Last 1 Encounters:  05/09/23 122/84         Passed - Valid encounter within last 6 months    Recent Outpatient Visits           6 days ago Essential hypertension   [place] [facility] [place], Salvadore Oxford, NP   4 months ago 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[facility])   [place] [facility] [place], Salvadore Oxford, NP   6 months ago Encounter for general adult medical examination with abnormal findings   [place] [facility] [place], Salvadore Oxford, NP   11 months ago Other infective acute otitis externa of right ear   [place] [facility] [place], Salvadore Oxford, NP   1 year ago Acute nasopharyngitis   [place] [facility] [place], Salvadore Oxford, NP       Future Appointments             In 6 months [place], Salvadore Oxford, NP [place] [facility], [facility]

## 2023-05-16 ENCOUNTER — Ambulatory Visit: Payer: Medicare Other | Admitting: Internal Medicine

## 2023-05-18 ENCOUNTER — Telehealth: Payer: Self-pay | Admitting: Internal Medicine

## 2023-05-18 NOTE — Telephone Encounter (Signed)
Called LVM 05/18/2023 to schedule Annual Wellness Visit  Brandon Myers; Care Guide Ambulatory Clinical Support [name] [facility] Direct Dial: [phone]

## 2023-06-21 IMAGING — DX DG LUMBAR SPINE COMPLETE 4+V
5 series · 5 of 5 positions shown · non-contrast
Comparison: No prior.

CLINICAL DATA: Left upper and lower extremity paresthesias.

EXAM:
LUMBAR SPINE - COMPLETE 4+ VIEW

[l-spine ap]
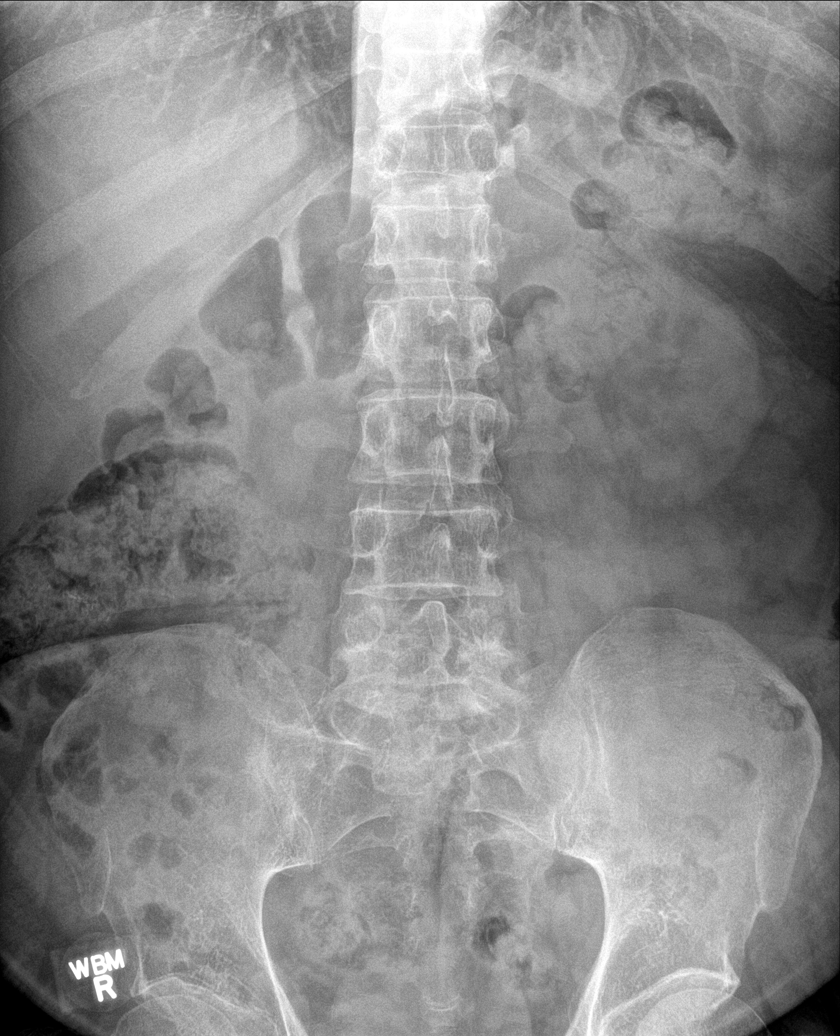

[l-spine obl (1 of 2)]
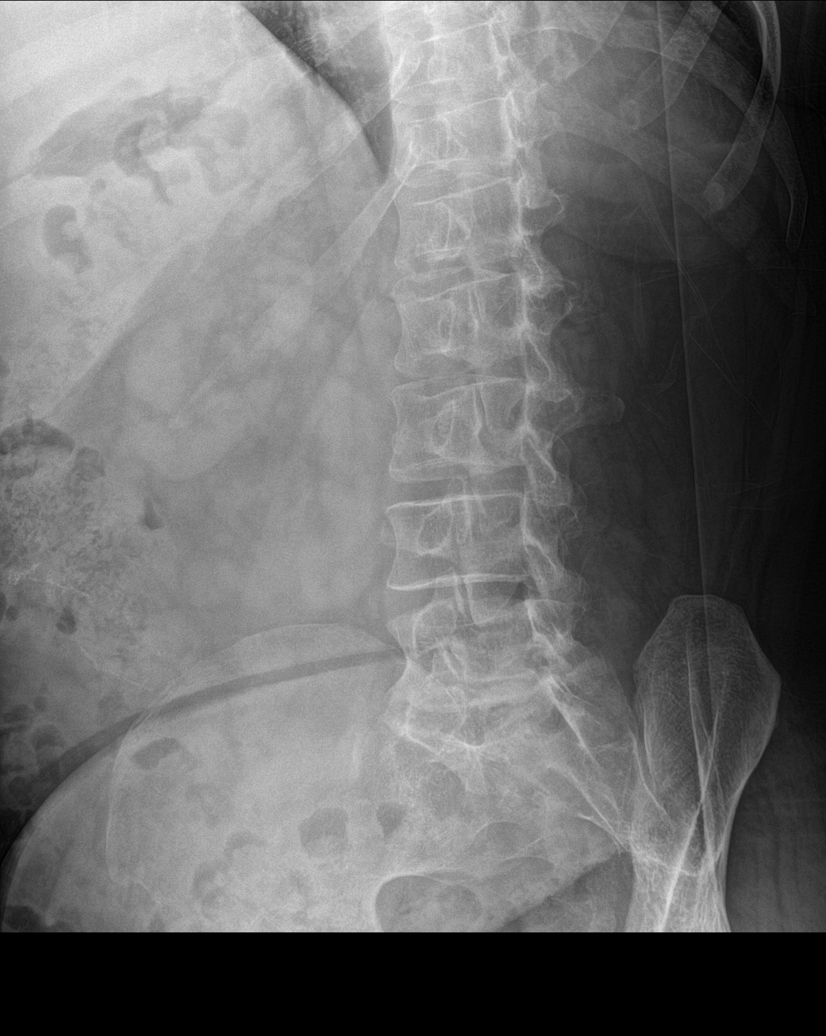

[l-spine obl (2 of 2)]
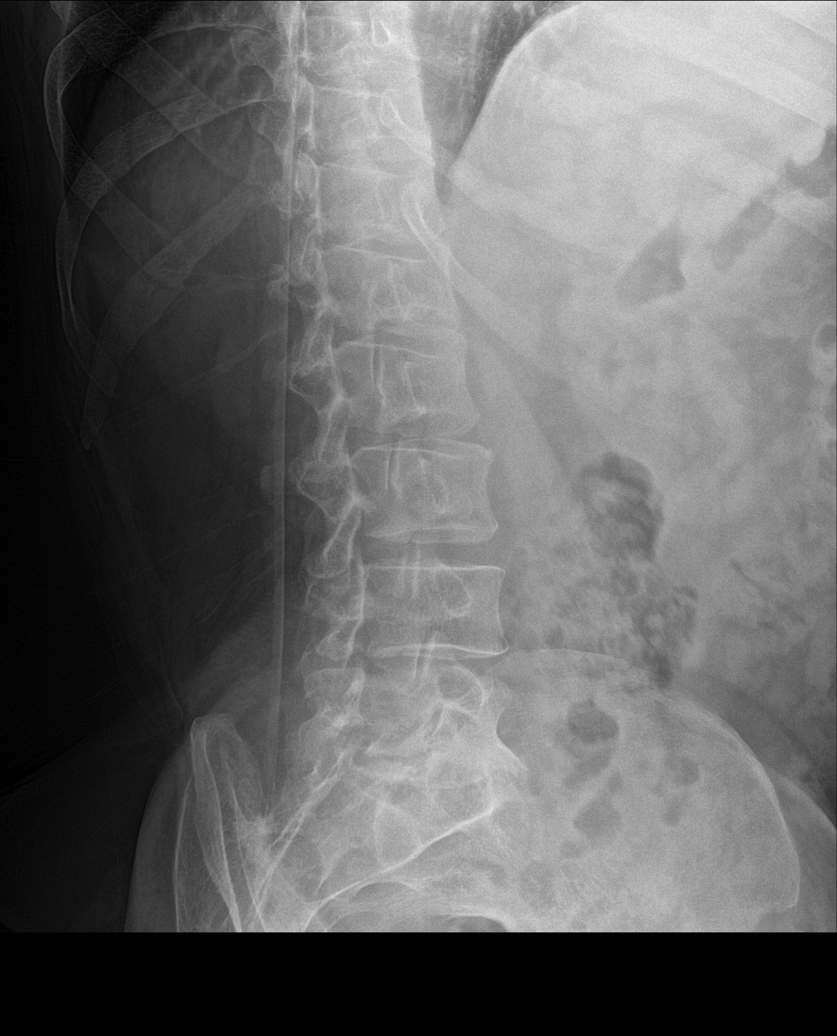

[l-spine lat]
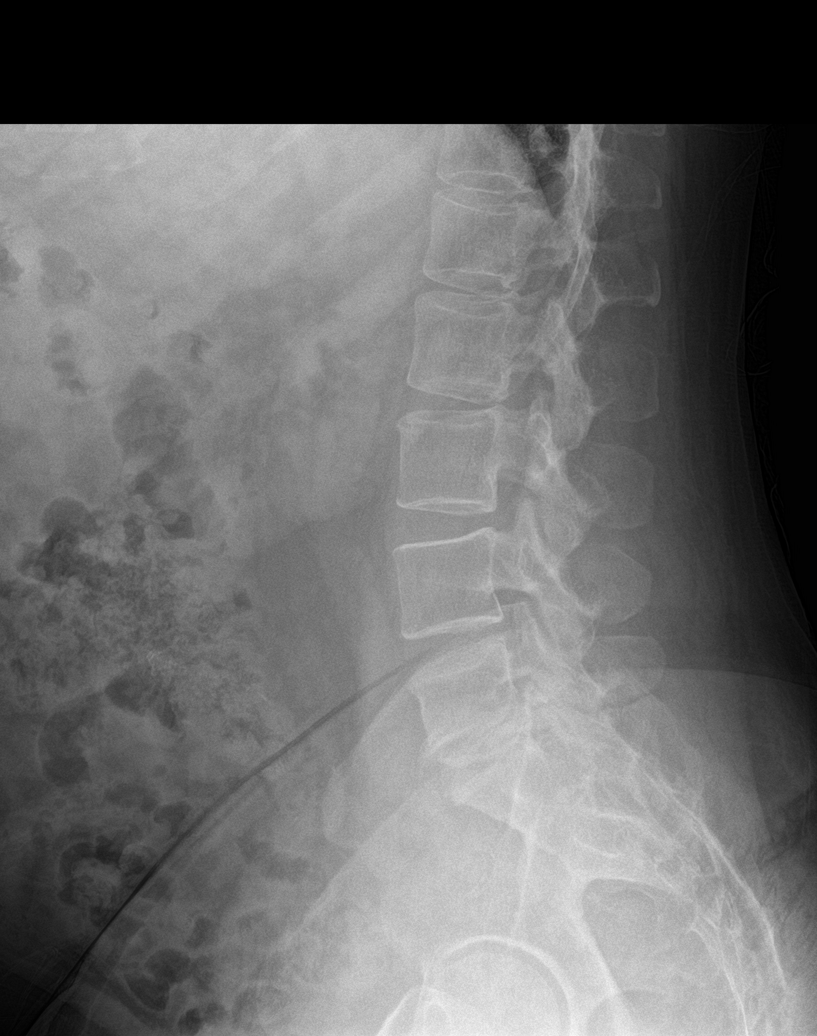

[l-spine spot]
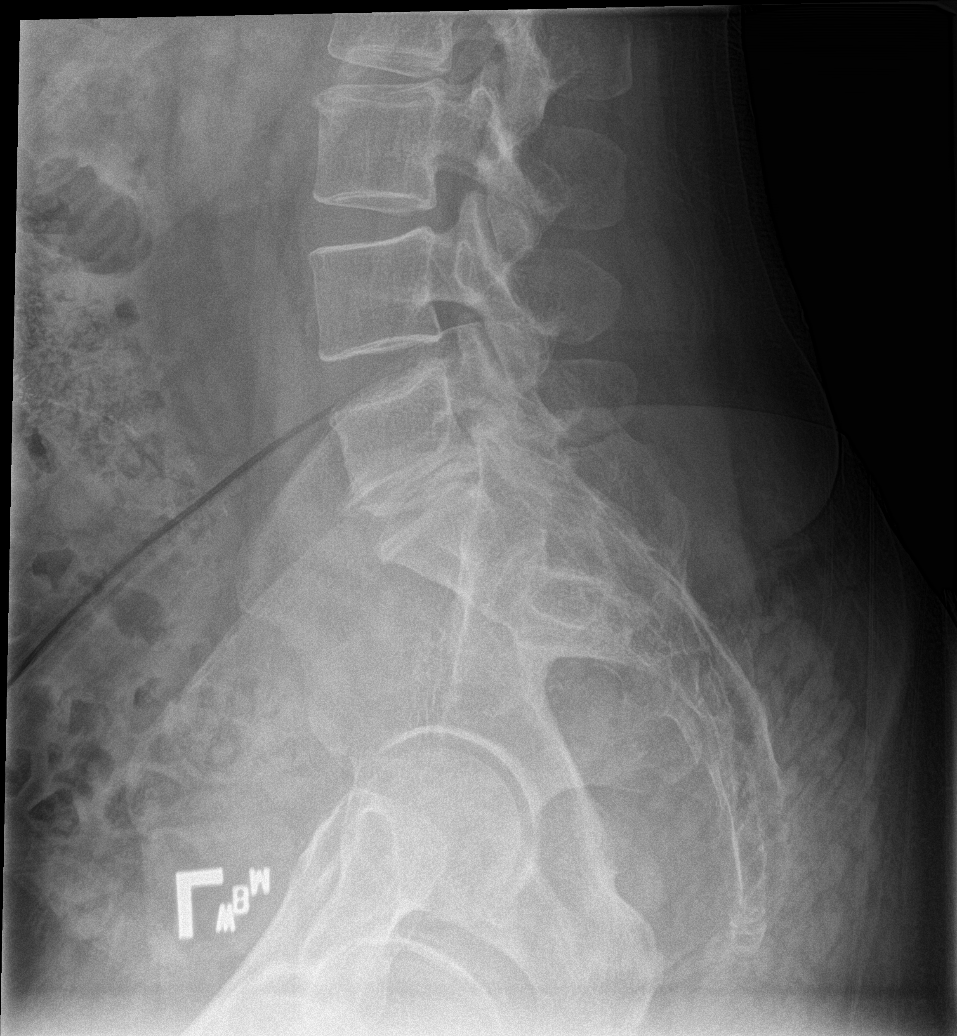

[5 of 5 positions shown; findings below may reference images not displayed]

FINDINGS: Lumbar spine numbered the lowest segmented appearing lumbar shaped
vertebrae on lateral view as L5. Diffuse osteopenia. L5-S1 disc
degeneration and endplate osteophyte formation. No acute bony
abnormality. No evidence of fracture. Paraspinal soft tissues are
unremarkable.
IMPRESSION: Diffuse osteopenia. L5-S1 disc degeneration and endplate osteophyte
formation. No acute abnormality identified.

## 2023-06-21 IMAGING — DX DG CERVICAL SPINE COMPLETE 4+V
6 series · 6 of 6 positions shown · non-contrast
Comparison: Soft tissue neck 10/27/2018.

CLINICAL DATA: Left upper and lower extremity paresthesias.

EXAM:
CERVICAL SPINE - COMPLETE 4+ VIEW

[c-spine lat]
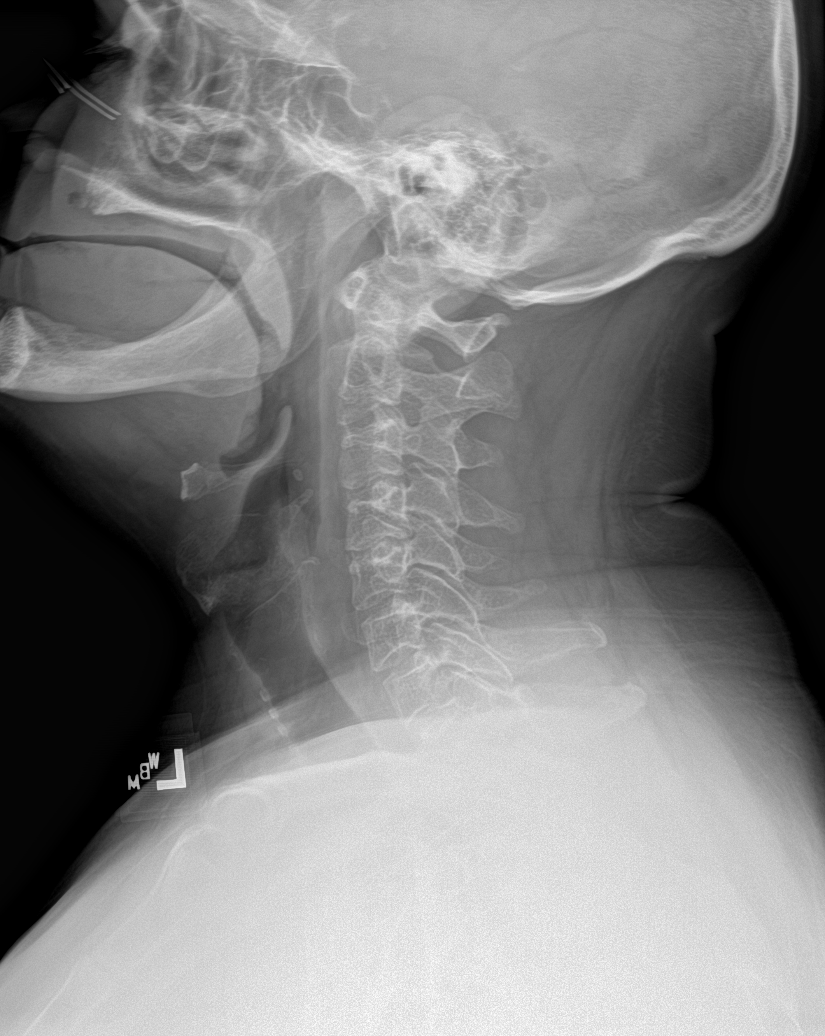

[c-spine obl (1 of 2)]
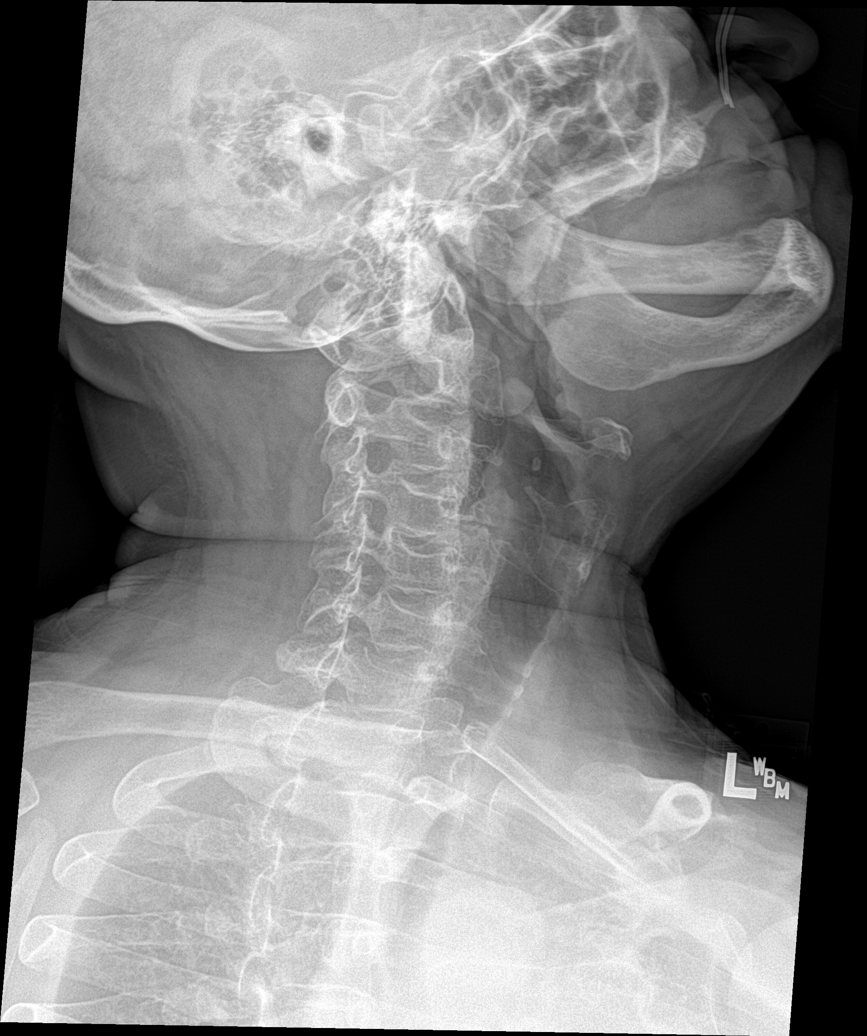

[c-spine obl (2 of 2)]
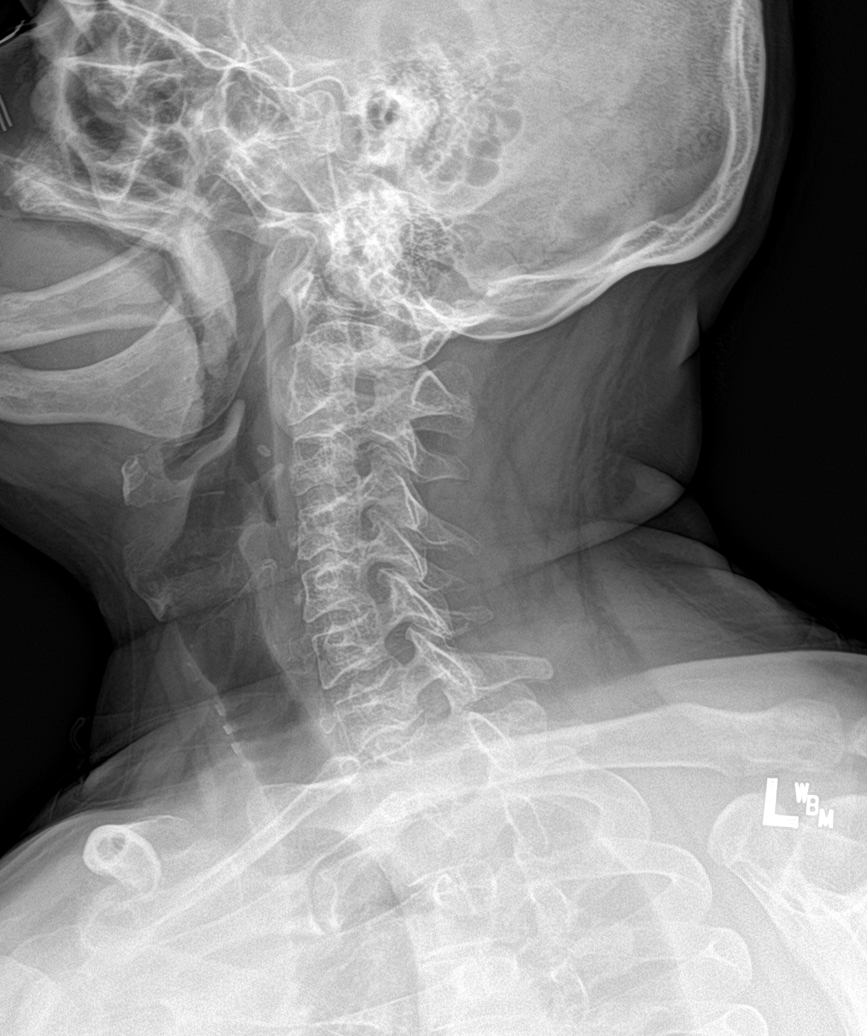

[c-spine ap]
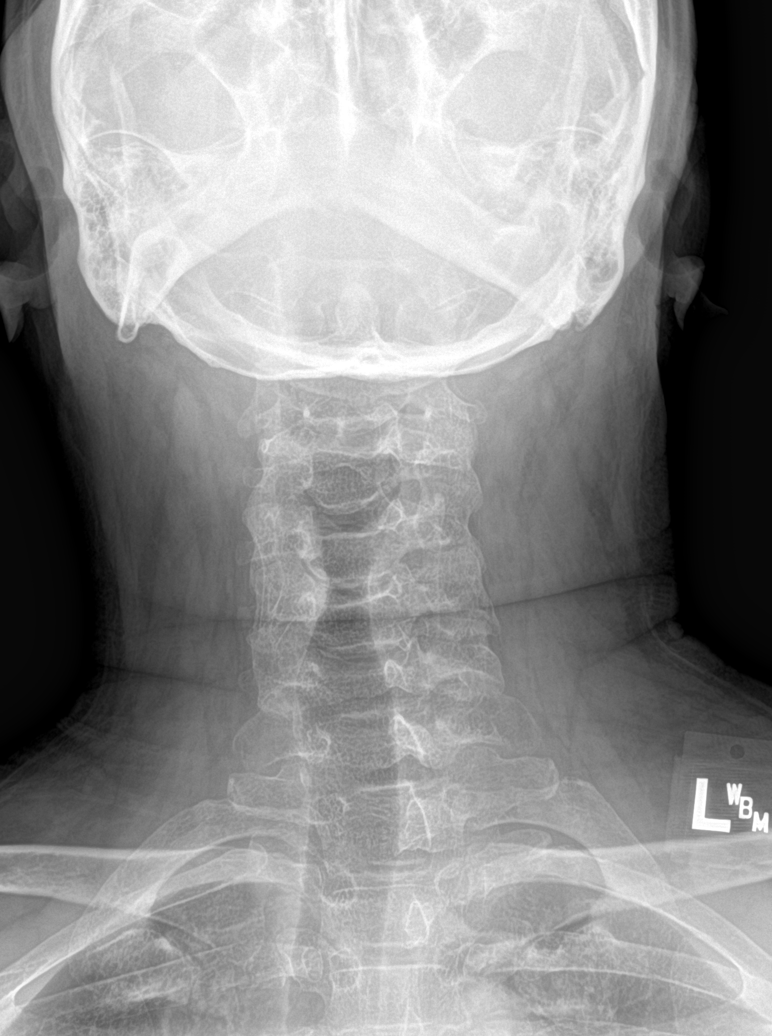

[c-spine open mouth]
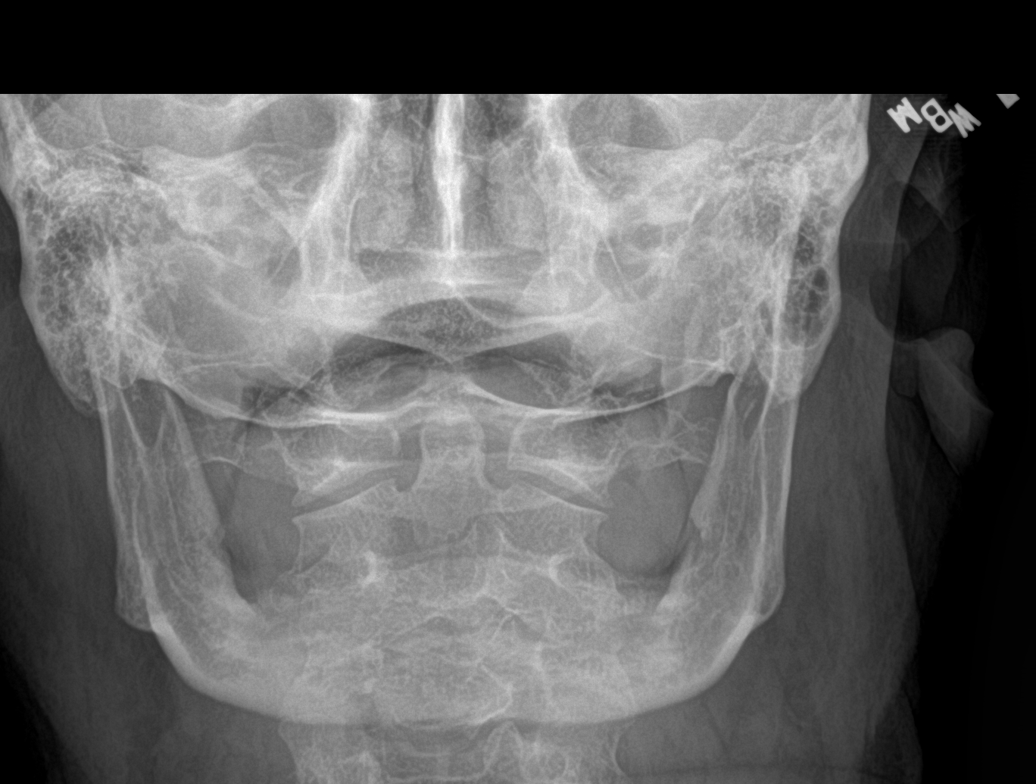

[c-spine swimmers]
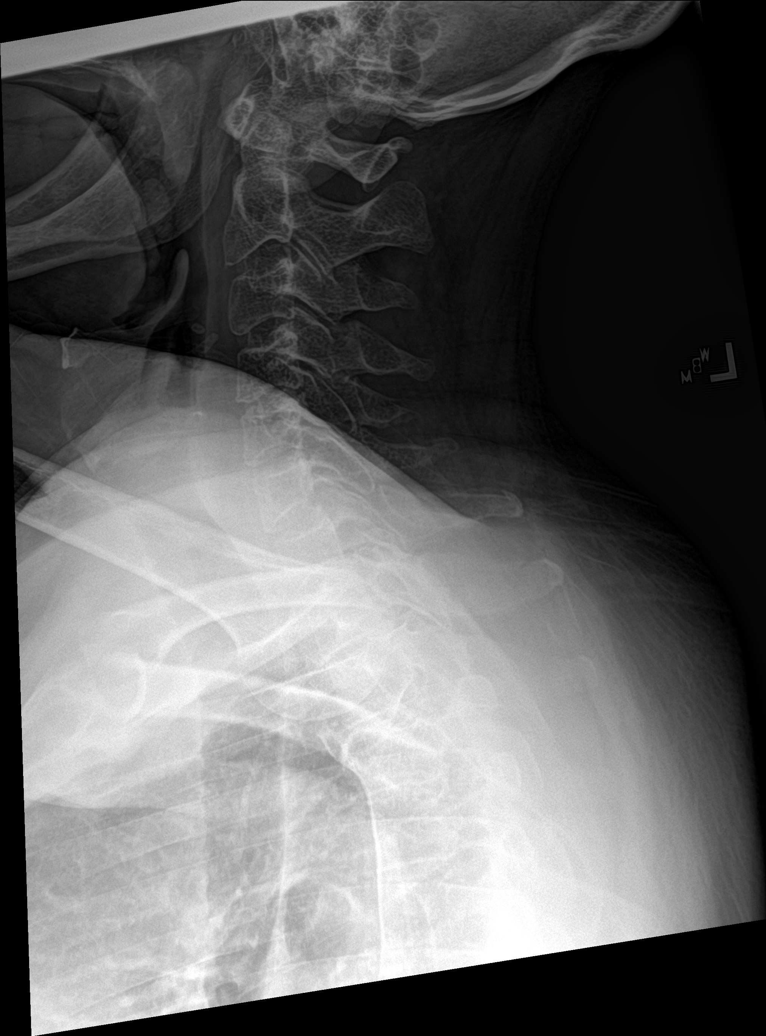

[6 of 6 positions shown; findings below may reference images not displayed]

FINDINGS: Diffuse osteopenia. Mild multilevel degenerative disc disease and
facet hypertrophy. Mild narrowing of the left C4 and C5 neural
foramen. No acute bony abnormality. No evidence of fracture
dislocation. Pulmonary apices are clear.
IMPRESSION: Diffuse osteopenia. Mild multilevel degenerative disc disease and
facet hypertrophy. Mild narrowing of the left C4 and C5 neural
foramen. No acute bony abnormality.

## 2023-06-26 ENCOUNTER — Ambulatory Visit: Payer: Self-pay

## 2023-06-26 ENCOUNTER — Ambulatory Visit (INDEPENDENT_AMBULATORY_CARE_PROVIDER_SITE_OTHER): Payer: Medicare Other | Admitting: Physician Assistant

## 2023-06-26 VITALS — BP 138/88 | HR 90 | Temp 98.5°F | Resp 16 | Ht 68.0 in | Wt 213.1 lb

## 2023-06-26 DIAGNOSIS — J0110 Acute frontal sinusitis, unspecified: Secondary | ICD-10-CM | POA: Diagnosis not present

## 2023-06-26 DIAGNOSIS — R0689 Other abnormalities of breathing: Secondary | ICD-10-CM

## 2023-06-26 MED ORDER — AMOXICILLIN-POT CLAVULANATE 875-125 MG PO TABS
1.0000 | ORAL_TABLET | Freq: Two times a day (BID) | ORAL | 0 refills | Status: DC
Start: 2023-06-26 — End: 2023-10-30

## 2023-06-26 NOTE — Progress Notes (Signed)
Acute Office Visit   Patient: Brandon Myers   DOB: 1965/09/01   57 y.o. Male  MRN: [id number] Visit Date: 06/26/2023  Today's healthcare provider: Oswaldo Conroy Tameca Jerez, PA-C  Introduced myself to the patient as a Secondary school teacher and provided education on APPs in clinical practice.    Chief Complaint  Patient presents with   Cough    Onset since last Thursday, coughing up brown stuff   Fatigue    Onset for a week   Subjective    HPI HPI     Cough    Additional comments: Onset since last Thursday, coughing up brown stuff        Fatigue    Additional comments: Onset for a week      Last edited by Forde Radon, CMA on 06/26/2023  3:29 PM.      URI -type symptoms  Onset: sudden  Duration: ongoing since Thursday  Associated symptoms: productive cough with brown sputum, nasal congestion, fatigue, SOB  Feels like symptoms have been progressing since Thursday   Intervention: Dayquil   Recent sick contacts: both of his sons have been sick Recent travel: none  COVID testing at home: Has not tested for COVID   Result:NA   Medications: Outpatient Medications Prior to Visit  Medication Sig   acetaminophen (TYLENOL) 650 MG CR tablet Take 1,300 mg by mouth 2 (two) times daily as needed for pain.   albuterol (VENTOLIN HFA) 108 (90 Base) MCG/ACT inhaler INHALE 2 PUFFS BY MOUTH FOUR TIMES DAILY AS NEEDED   atorvastatin (LIPITOR) 10 MG tablet TAKE 1 TABLET(10 MG) BY MOUTH DAILY   filgrastim-sndz (ZARXIO) 480 MCG/0.8ML SOSY injection Inject 0.8 mLs into the muscle every other day.   gabapentin (NEURONTIN) 800 MG tablet TAKE 1 TABLET(800 MG) BY MOUTH THREE TIMES DAILY   loratadine (CLARITIN) 10 MG tablet TAKE 1 TABLET(10 MG) BY MOUTH DAILY (Patient taking differently: daily as needed.)   losartan (COZAAR) 100 MG tablet TAKE 1 TABLET(100 MG) BY MOUTH DAILY   metoprolol succinate (TOPROL-XL) 100 MG 24 hr tablet TAKE 1 TABLET BY MOUTH EVERY DAY AFTER A MEAL   naproxen  (NAPROSYN) 500 MG tablet Take 1 tablet (500 mg total) by mouth 2 (two) times daily with a meal.   nortriptyline (PAMELOR) 25 MG capsule TAKE 1 CAPSULE(25 MG) BY MOUTH AT BEDTIME   omeprazole (PRILOSEC) 40 MG capsule Take 1 capsule (40 mg total) by mouth daily.   tiZANidine (ZANAFLEX) 4 MG tablet Take 1 tablet (4 mg total) by mouth at bedtime as needed for muscle spasms.   No facility-administered medications prior to visit.    Review of Systems  Constitutional:  Positive for fatigue. Negative for chills, diaphoresis and fever.  HENT:  Positive for congestion, postnasal drip, sinus pressure and voice change. Negative for ear pain, rhinorrhea, sinus pain and sore throat.   Respiratory:  Positive for cough and shortness of breath. Negative for wheezing.   Gastrointestinal:  Negative for diarrhea, nausea and vomiting.  Musculoskeletal:  Negative for myalgias.  Neurological:  Negative for dizziness, light-headedness and headaches.        Objective    BP 138/88   Pulse 90   Temp 98.5 F (36.9 C) (Oral)   Resp 16   Ht 5\' 8"  (1.727 m)   Wt 213 lb 1.6 oz (96.7 kg)   SpO2 98%   BMI 32.40 kg/m     Physical Exam Vitals reviewed.  Constitutional:  General: He is awake.     Appearance: He is well-developed and well-groomed. He is ill-appearing and diaphoretic.  HENT:     Head: Normocephalic and atraumatic.     Right Ear: Hearing, tympanic membrane and ear canal normal.     Left Ear: Hearing, tympanic membrane and ear canal normal.     Mouth/Throat:     Lips: Pink.     Mouth: Mucous membranes are moist.     Pharynx: Uvula midline. Postnasal drip present. No pharyngeal swelling, oropharyngeal exudate, posterior oropharyngeal erythema or uvula swelling.  Eyes:     General: Lids are normal. Gaze aligned appropriately.  Pulmonary:     Effort: Pulmonary effort is normal.     Breath sounds: Decreased air movement present. Examination of the left-middle field reveals decreased breath  sounds. Examination of the left-lower field reveals decreased breath sounds. Decreased breath sounds present. No wheezing, rhonchi or rales.  Musculoskeletal:     Cervical back: Normal range of motion and neck supple.  Lymphadenopathy:     Head:     Right side of head: Submandibular adenopathy present. No submental or preauricular adenopathy.     Left side of head: Submandibular adenopathy present. No submental or preauricular adenopathy.     Cervical:     Right cervical: No superficial cervical adenopathy.    Left cervical: No superficial cervical adenopathy.  Skin:    General: Skin is warm and moist.  Neurological:     Mental Status: He is alert.  Psychiatric:        Behavior: Behavior is cooperative.       No results found for any visits on 06/26/23.  Assessment & Plan      No follow-ups on file.        Problem List Items Addressed This Visit   None Visit Diagnoses     Acute frontal sinusitis, recurrence not specified    -  Primary   Relevant Medications   amoxicillin-clavulanate (AUGMENTIN) 875-125 MG tablet   Decreased breath sounds at left lung base       Relevant Medications   amoxicillin-clavulanate (AUGMENTIN) 875-125 MG tablet      Acute, new concern Patient reports ongoing symptoms since Thursday comprised of nasal congestion, mild shortness of breath, productive cough with yellow to brown sputum, fatigue Symptoms appear most consistent with likely bacterial infection.  Given findings nasal congestion plus reduced breath sounds on the left side I am unsure if this is URI versus developing pneumonia Will treat with Augmentin 875-125 p.o. twice daily x 10 days. Recommend that he continues over-the-counter medications as needed for symptomatic relief Reviewed ED and return precautions Follow-up as needed for progressing or persistent symptoms  No follow-ups on file.   I, Elizeth Weinrich E Evita Merida, PA-C, have reviewed all documentation for this visit. The documentation  on 06/26/23 for the exam, diagnosis, procedures, and orders are all accurate and complete.   Jacquelin Hawking, MHS, PA-C [facility]

## 2023-06-26 NOTE — Telephone Encounter (Signed)
  Chief Complaint: Cough - brown sputum - occasional SOB Symptoms: fatigue Frequency: Thursday last week Pertinent Negatives: Patient denies  Disposition: [] ED /[] Urgent Care (no appt availability in office) / [x] Appointment(In office/virtual)/ []  [place] [facility]/ [] Home Care/ [] Refused Recommended Disposition /[] [facility]/ []  Follow-up with PCP Additional Notes: Pt states that this began last week. He has taken some otc meds with mild relief. Pt is now coughing up brown sputum and reports some SOB and fatigue. No appts available for today. Scheduled with Erin [facility]um at [facility].  Summary: cough and congestion   The patient has experienced significant cough and congestion for roughly 3-4 days  The patient shares that their congestion has been dark in color  The patient has not experienced any major respiratory concern or nausea  The patient shares that they have been more tired than recently  Please contact when possible     Reason for Disposition  Coughing up rusty-colored (reddish-brown) sputum  Answer Assessment - Initial Assessment Questions 1. ONSET: "When did the cough begin?"      Thursday of last week 2. SEVERITY: "How bad is the cough today?"      4-5 times a day 3. SPUTUM: "Describe the color of your sputum" (none, dry cough; clear, white, yellow, green)     brown 4. HEMOPTYSIS: "Are you coughing up any blood?" If so ask: "How much?" (flecks, streaks, tablespoons, etc.)     no 5. DIFFICULTY BREATHING: "Are you having difficulty breathing?" If Yes, ask: "How bad is it?" (e.g., mild, moderate, severe)    - MILD: No SOB at rest, mild SOB with walking, speaks normally in sentences, can lie down, no retractions, pulse < 100.    - MODERATE: SOB at rest, SOB with minimal exertion and prefers to sit, cannot lie down flat, speaks in phrases, mild retractions, audible wheezing, pulse 100-120.    - SEVERE: Very SOB at rest, speaks in single words, struggling  to breathe, sitting hunched forward, retractions, pulse > 120      Out of breath at times for 1 month 6. FEVER: "Do you have a fever?" If Yes, ask: "What is your temperature, how was it measured, and when did it start?"     unsure 7. CARDIAC HISTORY: "Do you have any history of heart disease?" (e.g., heart attack, congestive heart failure)      High BP 8. LUNG HISTORY: "Do you have any history of lung disease?"  (e.g., pulmonary embolus, asthma, emphysema)     no 10. OTHER SYMPTOMS: "Do you have any other symptoms?" (e.g., runny nose, wheezing, chest pain)       Sinus drainage, fatigue  Protocols used: Cough - Acute Productive-A-AH

## 2023-06-26 NOTE — Patient Instructions (Addendum)
Based on your symptoms and duration of illness, I believe you may have a bacterial sinus infection  These typically resolve with antibiotic therapy along with at-home comfort measures  Today I have sent in a prescription for Augmentin 875-125 mg to be taken by mouth twice per day for 7 days FINISH THE ENTIRE COURSE unless you develop an allergic reaction or are instructed to discontinue.  It can take a few days for the antibiotic to kick in so I recommend symptomatic relief with over the counter medication such as the following: Dayquil/ Nyquil Theraflu Alkaseltzer  You can take Robitussin and Mucinex along with Tylenol if you have high blood pressure and need to avoid decongestants that may be in the multi-drug medications   I also recommend adding an antihistamine to your daily regimen This includes medications like Claritin, Allegra, Zyrtec- the generics of these work very well and are usually less expensive I recommend using Flonase nasal spray - 2 puffs twice per day to help with your nasal congestion The antihistamines and Flonase can take a few weeks to provide significant relief from allergy symptoms but should start to provide some benefit soon.    These medications typically have Tylenol in them already so you can take Ibuprofen as needed for further pain/ discomfort and fever management/ do not need to supplement with more outside of those medications  Stay well hydrated with at least 75 oz of water per day to help with recovery  If you notice any of the following please let us know: increased fever not responding to Tylenol or Ibuprofen, swelling around your nose or eyes, difficulty seeing,

## 2023-07-11 ENCOUNTER — Encounter: Payer: Self-pay | Admitting: Internal Medicine

## 2023-07-11 MED ORDER — GABAPENTIN 600 MG PO TABS: 600.0000 mg | ORAL_TABLET | Freq: Three times a day (TID) | ORAL | 0 refills | Status: DC

## 2023-07-24 ENCOUNTER — Ambulatory Visit
Admission: EM | Admit: 2023-07-24 | Discharge: 2023-07-24 | Disposition: A | Discharge status: Home / Self Care | Payer: Medicare Other

## 2023-07-24 DIAGNOSIS — I10 Essential (primary) hypertension: Secondary | ICD-10-CM | POA: Diagnosis not present

## 2023-07-24 DIAGNOSIS — J069 Acute upper respiratory infection, unspecified: Secondary | ICD-10-CM

## 2023-07-24 MED ORDER — AZITHROMYCIN 250 MG PO TABS: 250.0000 mg | ORAL_TABLET | Freq: Every day | ORAL | 0 refills | Status: DC

## 2023-07-24 NOTE — ED Provider Notes (Signed)
Renaldo Fiddler    CSN: [id number] Arrival date & time: 07/24/23  1432      History   Chief Complaint Chief Complaint  Patient presents with   Cough   Nasal Congestion    HPI Brandon Myers is a 57 y.o. male.  Accompanied by his wife, patient presents with 3 to 4 day history of chills, congestion, cough.  He has felt warm but has not taken his temperature at home.  He has been treating his symptoms with DayQuil.  No ear pain, sore throat, shortness of breath.  Patient was seen by his PCP on 06/26/2023; diagnosed with acute sinusitis and decreased breath sounds at left lung base; treated with Augmentin.  His medical history includes hypertension, congenital neutropenia.  The history is provided by the patient, the spouse and medical records.    Past Medical History:  Diagnosis Date   Allergy    Anemia 03/03/2022   Anxiety 01/21/2020   Controlled substance agreement signed 04/02/2017   GERD (gastroesophageal reflux disease)    Hypertension    Neutropenia, congenital (HCC)    Phantom limb pain (HCC)     Patient Active Problem List   Diagnosis Date Noted   Prediabetes 05/09/2023   Therapeutic opioid induced constipation 03/03/2022   Class 1 obesity due to excess calories with body mass index (BMI) of 31.0 to 31.9 in adult 04/26/2021   Anxiety 01/21/2020   Chronic pain syndrome 12/14/2017   Hyperlipidemia 12/06/2016   Phantom limb pain (HCC) 10/29/2015   Essential hypertension 04/27/2015   Esophageal reflux 04/27/2015    Past Surgical History:  Procedure Laterality Date   ABOVE ELBOW ARM AMPUTATION Right 1993   AMPUTATION     APPENDECTOMY     COLON SURGERY  1994   COLONOSCOPY WITH PROPOFOL N/A 12/28/2022   Procedure: COLONOSCOPY WITH PROPOFOL;  Surgeon: Wyline Mood, MD;  Location[facility] ENDOSCOPY;  Service: Gastroenterology;  Laterality: N/A;   LEG AMPUTATION THROUGH KNEE Right 1993       Home Medications    Prior to Admission medications   Medication Sig  Start Date End Date Taking? Authorizing Provider  atorvastatin (LIPITOR) 10 MG tablet Take 1 tablet by mouth daily. 03/08/22  Yes [provider]  azithromycin (ZITHROMAX) 250 MG tablet Take 1 tablet (250 mg total) by mouth daily. Take first 2 tablets together, then 1 every day until finished. 07/24/23  Yes Mickie Bail, NP  acetaminophen (TYLENOL) 650 MG CR tablet Take 1,300 mg by mouth 2 (two) times daily as needed for pain.    [provider]  albuterol (VENTOLIN HFA) 108 (90 Base) MCG/ACT inhaler INHALE 2 PUFFS BY MOUTH FOUR TIMES DAILY AS NEEDED 04/26/21   Lorre Munroe, NP  amoxicillin-clavulanate (AUGMENTIN) 875-125 MG tablet Take 1 tablet by mouth 2 (two) times daily. Patient not taking: Reported on 07/24/2023 06/26/23   Mecum, Erin E, PA-C  atorvastatin (LIPITOR) 10 MG tablet TAKE 1 TABLET(10 MG) BY MOUTH DAILY 11/24/22   Lorre Munroe, NP  filgrastim-sndz [facility]) 480 MCG/0.8ML SOSY injection Inject 0.8 mLs into the muscle every other day. 10/29/14   [provider]  gabapentin (NEURONTIN) 600 MG tablet Take 1 tablet (600 mg total) by mouth 3 (three) times daily. 07/11/23   Lorre Munroe, NP  loratadine (CLARITIN) 10 MG tablet TAKE 1 TABLET(10 MG) BY MOUTH DAILY Patient taking differently: daily as needed. 01/20/20   Particia Nearing, PA-C  losartan (COZAAR) 100 MG tablet TAKE 1 TABLET(100  MG) BY MOUTH DAILY 05/15/23   Lorre Munroe, NP  metoprolol succinate (TOPROL-XL) 100 MG 24 hr tablet TAKE 1 TABLET BY MOUTH EVERY DAY AFTER A MEAL 05/09/23   Lorre Munroe, NP  naproxen (NAPROSYN) 500 MG tablet Take 1 tablet (500 mg total) by mouth 2 (two) times daily with a meal. 05/09/23   Lorre Munroe, NP  nortriptyline (PAMELOR) 25 MG capsule TAKE 1 CAPSULE(25 MG) BY MOUTH AT BEDTIME 05/09/23   Lorre Munroe, NP  omeprazole (PRILOSEC) 40 MG capsule Take 1 capsule (40 mg total) by mouth daily. 05/09/23   Lorre Munroe, NP  tiZANidine (ZANAFLEX) 4 MG tablet  Take 1 tablet (4 mg total) by mouth at bedtime as needed for muscle spasms. 03/03/22   Lorre Munroe, NP    Family History Family History  Problem Relation Age of Onset   Heart Problems Mother    Asthma Mother    Depression Mother    Thyroid disease Mother    Alzheimer's disease Mother    Hypertension Mother    Stroke Maternal Grandmother    Cancer Maternal Grandmother    Cancer Father        skin   Hypertension Father    Cancer Paternal Grandmother    Cancer Paternal Grandfather     Social History Social History   Tobacco Use   Smoking status: Never   Smokeless tobacco: Never  Vaping Use   Vaping status: Never Used  Substance Use Topics   Alcohol use: Never    Alcohol/week: 0.0 standard drinks of alcohol   Drug use: Never     Allergies   Enalapril, Hydrochlorothiazide, Prochlorperazine, Sulfa antibiotics, Benadryl [diphenhydramine], and Strawberry extract   Review of Systems Review of Systems  Constitutional:  Positive for chills. Negative for fever.  HENT:  Positive for congestion. Negative for ear pain and sore throat.   Respiratory:  Positive for cough. Negative for shortness of breath.   Cardiovascular:  Negative for chest pain and palpitations.     Physical Exam Triage Vital Signs ED Triage Vitals  Encounter Vitals Group     BP 07/24/23 1544 (!) 153/95     Systolic BP Percentile --      Diastolic BP Percentile --      Pulse Rate 07/24/23 1544 90     Resp 07/24/23 1544 18     Temp 07/24/23 1544 99.5 F (37.5 C)     Temp src --      SpO2 07/24/23 1544 95 %     Weight --      Height --      Head Circumference --      Peak Flow --      Pain Score 07/24/23 1601 1     Pain Loc --      Pain Education --      Exclude from Growth Chart --    No data found.  Updated Vital Signs BP (!) 154/99   Pulse 90   Temp 99.5 F (37.5 C)   Resp 18   SpO2 95%   Visual Acuity Right Eye Distance:   Left Eye Distance:   Bilateral Distance:    Right  Eye Near:   Left Eye Near:    Bilateral Near:     Physical Exam Constitutional:      General: He is not in acute distress. HENT:     Right Ear: Tympanic membrane normal.     Left Ear: Tympanic membrane  normal.     Nose: Nose normal.     Mouth/Throat:     Mouth: Mucous membranes are moist.     Pharynx: Oropharynx is clear.  Cardiovascular:     Rate and Rhythm: Normal rate and regular rhythm.     Heart sounds: Normal heart sounds.  Pulmonary:     Effort: Pulmonary effort is normal. No respiratory distress.     Breath sounds: Normal breath sounds.  Skin:    General: Skin is warm and dry.  Neurological:     Mental Status: He is alert.      UC Treatments / Results  Labs (all labs ordered are listed, but only abnormal results are displayed) Labs Reviewed - No data to display  EKG   Radiology No results found.  Procedures Procedures (including critical care time)  Medications Ordered in UC Medications - No data to display  Initial Impression / Assessment and Plan / UC Course  I have reviewed the triage vital signs and the nursing notes.  Pertinent labs & imaging results that were available during my care of the patient were reviewed by me and considered in my medical decision making (see chart for details).    Acute URI, Elevated blood pressure with HTN.  Lungs are clear and O2 sat is 95% on room air.  Treating with Zithromax.  Tylenol as needed.  Instructed patient to follow-up with his PCP if he is not improving.  Education provided on upper respiratory infection.  Also discussed with patient that his blood pressure is elevated today and needs to be rechecked by PCP in 2 to 4 weeks.  Education provided on managing hypertension.  He agrees to plan of care.    Final Clinical Impressions(s) / UC Diagnoses   Final diagnoses:  Acute upper respiratory infection  Elevated blood pressure reading in office with diagnosis of hypertension     Discharge Instructions       Take the Zithromax as directed.  Follow-up with your primary care provider if your symptoms are not improving.   Your blood pressure is elevated today at 153/95; recheck 154/99.  Please have this rechecked by your primary care provider in 2-4 weeks.          ED Prescriptions     Medication Sig Dispense Auth. Provider   azithromycin (ZITHROMAX) 250 MG tablet Take 1 tablet (250 mg total) by mouth daily. Take first 2 tablets together, then 1 every day until finished. 6 tablet Mickie Bail, NP      PDMP not reviewed this encounter.   Mickie Bail, NP 07/24/23 [phone]

## 2023-07-24 NOTE — ED Triage Notes (Signed)
Patient to Urgent Care with complaints of productive cough and nasal congestion. Intermittent fevers/ drainage.   Symptoms started five days ago.   Meds: Dayquil

## 2023-07-24 NOTE — Discharge Instructions (Addendum)
Take the Zithromax as directed.  Follow-up with your primary care provider if your symptoms are not improving.   Your blood pressure is elevated today at 153/95; recheck 154/99.  Please have this rechecked by your primary care provider in 2-4 weeks.

## 2023-08-06 ENCOUNTER — Other Ambulatory Visit: Payer: Self-pay | Admitting: Internal Medicine

## 2023-08-08 NOTE — Telephone Encounter (Signed)
 Requested Prescriptions  Pending Prescriptions Disp Refills   nortriptyline  (PAMELOR ) 25 MG capsule [Pharmacy Med Name: NORTRIPTYLINE  25MG  CAPSULES] 90 capsule 0    Sig: TAKE 1 CAPSULE(25 MG) BY MOUTH AT BEDTIME     Psychiatry:  Antidepressants - Heterocyclics (TCAs) Passed - 08/08/2023  4:22 PM      Passed - Valid encounter within last 6 months    Recent Outpatient Visits           1 month ago Acute frontal sinusitis, recurrence not specified   [facility] Mecum, Rocky BRAVO, PA-C   3 months ago Essential hypertension   [place] [facility] [place], Angeline ORN, NP   6 months ago 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[facility])   [place] [facility] [place], Angeline ORN, NP   8 months ago Encounter for general adult medical examination with abnormal findings   [place] [facility] [place], Angeline ORN, NP   1 year ago Other infective acute otitis externa of right ear   [facility] [place], Angeline ORN, NP       Future Appointments             In 3 months Ba[place], Angeline ORN, NP [place] [facility]

## 2023-10-05 ENCOUNTER — Other Ambulatory Visit: Payer: Self-pay | Admitting: Internal Medicine

## 2023-10-06 NOTE — Telephone Encounter (Signed)
 Requested Prescriptions  Pending Prescriptions Disp Refills   gabapentin (NEURONTIN) 600 MG tablet [Pharmacy Med Name: GABAPENTIN 600MG  TABLETS] 270 tablet 0    Sig: TAKE 1 TABLET(600 MG) BY MOUTH THREE TIMES DAILY     Neurology: Anticonvulsants - gabapentin Passed - 10/06/2023  8:51 AM      Passed - Cr in normal range and within 360 days    Creat  Date Value Ref Range Status  05/09/2023 0.79 0.70 - 1.30 mg/dL Final         Passed - Completed PHQ-2 or PHQ-9 in the last 360 days      Passed - Valid encounter within last 12 months    Recent Outpatient Visits           3 months ago Acute frontal sinusitis, recurrence not specified   [place] [facility] Mecum, Oswaldo Conroy, PA-C   5 months ago Essential hypertension   [place] [facility] [place], Salvadore [place], NP   8 months ago 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[facility])   [facility] [place], Salvadore Oxford, NP   10 months ago Encounter for general adult medical examination with abnormal findings   [place] [facility] [place], Salvadore Oxford, NP   1 year ago Other infective acute otitis externa of right ear   [place] [facility] [place], Salvadore Oxford, NP       Future Appointments             In 1 month [place], Salvadore Oxford, NP [facility], [facility]

## 2023-10-13 ENCOUNTER — Other Ambulatory Visit: Payer: Self-pay | Admitting: Internal Medicine

## 2023-10-13 NOTE — Telephone Encounter (Signed)
 Requested Prescriptions  Pending Prescriptions Disp Refills   metoprolol succinate (TOPROL-XL) 100 MG 24 hr tablet [Pharmacy Med Name: METOPROLOL ER SUCCINATE 100MG  TABS] 90 tablet 1    Sig: TAKE 1 TABLET BY MOUTH EVERY DAY AFTER A MEAL     Cardiovascular:  Beta Blockers Failed - 10/13/2023  3:55 PM      Failed - Last BP in normal range    BP Readings from Last 1 Encounters:  07/24/23 (!) 154/99         Passed - Last Heart Rate in normal range    Pulse Readings from Last 1 Encounters:  07/24/23 90         Passed - Valid encounter within last 6 months    Recent Outpatient Visits           3 months ago Acute frontal sinusitis, recurrence not specified   [facility] Mecum, Oswaldo Conroy, PA-C   5 months ago Essential hypertension   [place] [facility] [place], Salvadore Oxford, NP   9 months ago 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[facility])   [place] [facility] [place], Salvadore Oxford, NP   11 months ago Encounter for general adult medical examination with abnormal findings   [place] [place], Salvadore Oxford, NP   1 year ago Other infective acute otitis externa of right ear   [place] [facility] [place], Salvadore Oxford, NP       Future Appointments             In 1 month [place], Salvadore Oxford, NP [place] [facility], [facility]

## 2023-10-30 ENCOUNTER — Encounter: Payer: Self-pay | Admitting: Internal Medicine

## 2023-10-30 ENCOUNTER — Ambulatory Visit (INDEPENDENT_AMBULATORY_CARE_PROVIDER_SITE_OTHER): Admitting: Internal Medicine

## 2023-10-30 VITALS — BP 138/84 | Ht 68.0 in | Wt 197.6 lb

## 2023-10-30 DIAGNOSIS — L03032 Cellulitis of left toe: Secondary | ICD-10-CM

## 2023-10-30 MED ORDER — CEPHALEXIN 500 MG PO CAPS: 500.0000 mg | ORAL_CAPSULE | Freq: Three times a day (TID) | ORAL | 0 refills | Status: DC

## 2023-10-30 NOTE — Patient Instructions (Signed)
 Paronychia Paronychia is an infection of the skin. It happens near a fingernail or toenail. It may cause pain and swelling around the nail. In some cases, a fluid-filled bump (abscess) can form near or under the nail. Often, this condition is not serious, and it clears up with treatment. What are the causes? This condition may be caused by a germ. The germ may be bacteria or a fungus. These germs can enter the body through an opening in the skin, such as a cut or a hangnail. Other causes include: Repeated injuries to your fingernails or toenails. Irritation of the base and sides of the nail (cuticle). What increases the risk? This condition is more likely to develop in people who: Get their hands wet often, such as a dishwasher. Bite their fingernails or the base and sides of their nails. Have other skin problems. Have hangnails or hurt fingertips. Come into contact with chemicals like detergents. Have diabetes. What are the signs or symptoms? Redness and swelling of the skin near the nail. A tender feeling around the nail. Pus-filled bumps under the skin at the base and sides of the nail. Fluid or pus under the nail. Pain in the area. How is this treated? Treatment depends on the cause of your condition and how bad it is. If your condition is mild, it may clear up on its own in a few days or after soaking in warm water. If needed, treatment may include: Antibiotic medicine. Antifungal medicine. A procedure to drain pus from a fluid-filled bump. Medicine to treat irritation and swelling (corticosteroids). Taking off part of an ingrown toenail. A bandage (dressing) may be placed over the nail area. Follow these instructions at home: Wound care Keep the affected area clean. Soak the fingers or toes in warm water as told by your doctor. You may be told to do this for 20 minutes, 2-3 times a day. Keep the area dry when you are not soaking it. Do not try to drain a fluid-filled bump on  your own. Follow instructions from your doctor about how to take care of the affected area. Make sure you: Wash your hands with soap and water for at least 20 seconds before and after you change your bandage. If you cannot use soap and water, use hand sanitizer. Change your bandage as told by your doctor. If you had a fluid-filled bump and your doctor drained it, check the area every day for signs of infection. Check for: Redness, swelling, or pain. Fluid or blood. Warmth. Pus or a bad smell. Medicines  Take over-the-counter and prescription medicines only as told by your doctor. If you were prescribed an antibiotic medicine, take it as told by your doctor. Do not stop taking it even if you start to feel better. General instructions Avoid contact with anything that irritates your skin or that you are allergic to. Do not pick at the affected area. Keep all follow-up visits. Prevention To prevent this condition from happening again: Wear rubber gloves when putting your hands in water for washing dishes or other tasks. Wear gloves if your hands might touch cleaners or chemicals. Avoid injuring your nails or fingertips. Do not bite your nails or tear hangnails. Do not cut your nails very short. Do not cut the skin at the base and sides of the nail. Use clean nail clippers or scissors when trimming nails. Contact a doctor if: You feel worse. You do not get better. You keep having or you have more fluid, blood, or  pus coming from the affected area. Your affected finger, toe, or joint gets swollen or hard to move. You have a fever or chills. There is redness spreading from the affected area. Summary Paronychia is an infection of the skin. It happens near a fingernail or toenail. This condition may cause pain and swelling around the nail. Soak the fingers or toes in warm water as told by your doctor. Often, this condition is not serious, and it clears up with treatment. This information  is not intended to replace advice given to you by your health care provider. Make sure you discuss any questions you have with your health care provider. Document Revised: 10/19/2020 Document Reviewed: 10/19/2020 Elsevier Patient Education  2024 [employer].

## 2023-10-30 NOTE — Progress Notes (Signed)
 Subjective:    Patient ID: Brandon Myers, male    DOB: [birth year], 58 y.o.   MRN: [id number]  HPI  Discussed the use of AI scribe software for clinical note transcription with the patient, who gave verbal consent to proceed.   Brandon Myers is a 58 year old male who presents with redness and swelling in the left great toe.  He has been experiencing redness and swelling in his left toe for an unspecified duration, possibly several weeks. He has been applying a topical antifungal treatment to the area, which he believes may have caused irritation. No recent trauma or injury to the toe. No fever or chills.  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  He has not been wearing a prosthesis lately.  He occasionally wears socks on the left foot but often does not, which may contribute to moisture accumulation around the toe.  He has no history of diabetes.       Review of Systems   Past Medical History:  Diagnosis Date   Allergy    Anemia 03/03/2022   Anxiety 01/21/2020   Controlled substance agreement signed 04/02/2017   GERD (gastroesophageal reflux disease)    Hypertension    Neutropenia, congenital (HCC)    Phantom limb pain (HCC)     Current Outpatient Medications  Medication Sig Dispense Refill   acetaminophen (TYLENOL) 650 MG CR tablet Take 1,300 mg by mouth 2 (two) times daily as needed for pain.     albuterol (VENTOLIN HFA) 108 (90 Base) MCG/ACT inhaler INHALE 2 PUFFS BY MOUTH FOUR TIMES DAILY AS NEEDED 18 g 12   amoxicillin-clavulanate (AUGMENTIN) 875-125 MG tablet Take 1 tablet by mouth 2 (two) times daily. (Patient not taking: Reported on 07/24/2023) 20 tablet 0   atorvastatin (LIPITOR) 10 MG tablet TAKE 1 TABLET(10 MG) BY MOUTH DAILY 90 tablet 3   atorvastatin (LIPITOR) 10 MG tablet Take 1 tablet by mouth daily.     azithromycin (ZITHROMAX) 250 MG tablet Take 1 tablet (250 mg total) by mouth daily.  Take first 2 tablets together, then 1 every day until finished. 6 tablet 0   filgrastim-sndz (ZARXIO) 480 MCG/0.8ML SOSY injection Inject 0.8 mLs into the muscle every other day.     gabapentin (NEURONTIN) 600 MG tablet TAKE 1 TABLET(600 MG) BY MOUTH THREE TIMES DAILY 270 tablet 0   loratadine (CLARITIN) 10 MG tablet TAKE 1 TABLET(10 MG) BY MOUTH DAILY (Patient taking differently: daily as needed.) 90 tablet 3   losartan (COZAAR) 100 MG tablet TAKE 1 TABLET(100 MG) BY MOUTH DAILY 90 tablet 1   metoprolol succinate (TOPROL-XL) 100 MG 24 hr tablet TAKE 1 TABLET BY MOUTH EVERY DAY AFTER A MEAL 90 tablet 1   naproxen (NAPROSYN) 500 MG tablet Take 1 tablet (500 mg total) by mouth 2 (two) times daily with a meal. 14 tablet 0   nortriptyline (PAMELOR) 25 MG capsule TAKE 1 CAPSULE(25 MG) BY MOUTH AT BEDTIME 90 capsule 0   omeprazole (PRILOSEC) 40 MG capsule Take 1 capsule (40 mg total) by mouth daily. 90 capsule 1   tiZANidine (ZANAFLEX) 4 MG tablet Take 1 tablet (4 mg total) by mouth at bedtime as needed for muscle spasms. 90 tablet 1   No current facility-administered medications for this visit.    Allergies  Allergen Reactions   Enalapril Cough    ACEi Cough   Hydrochlorothiazide  Sweats, hot flashes   Prochlorperazine     Other reaction(s): Other (See Comments) As a child unable to recall   Sulfa Antibiotics Other (See Comments)    Other reaction(s): Other (See Comments) As a child unable to recall   Benadryl [Diphenhydramine]     Increased phantom pain   Strawberry Extract Rash    Family History  Problem Relation Age of Onset   Heart Problems Mother    Asthma Mother    Depression Mother    Thyroid disease Mother    Alzheimer's disease Mother    Hypertension Mother    Stroke Maternal Grandmother    Cancer Maternal Grandmother    Cancer Father        skin   Hypertension Father    Cancer Paternal Grandmother    Cancer Paternal Grandfather     Social History    Socioeconomic History   Marital status: Married    Spouse name: Valente Fosberg   Number of children: 2   Years of education: Not on file   Highest education level: Associate degree: occupational, Scientist, product/process development, or vocational program  Occupational History   Occupation: Disabled  Tobacco Use   Smoking status: Never   Smokeless tobacco: Never  Vaping Use   Vaping status: Never Used  Substance and Sexual Activity   Alcohol use: Never    Alcohol/week: 0.0 standard drinks of alcohol   Drug use: Never   Sexual activity: Never  Other Topics Concern   Not on file  Social History Narrative   Not on file   Social Drivers of Health   Financial Resource Strain: Low Risk  (06/26/2023)   Overall Financial Resource Strain (CARDIA)    Difficulty of Paying Living Expenses: Not very hard  Food Insecurity: Food Insecurity Present (06/26/2023)   Hunger Vital Sign    Worried About Running Out of Food in the Last Year: Never true    Ran Out of Food in the Last Year: Sometimes true  Transportation Needs: No Transportation Needs (06/26/2023)   PRAPARE - Administrator, Civil Service (Medical): No    Lack of Transportation (Non-Medical): No  Physical Activity: Unknown (06/26/2023)   Exercise Vital Sign    Days of Exercise per Week: 0 days    Minutes of Exercise per Session: Not on file  Stress: No Stress Concern Present (06/26/2023)   [employer] of Occupational Health - Occupational Stress Questionnaire    Feeling of Stress : Not at all  Social Connections: Unknown (06/26/2023)   Social Connection and Isolation Panel [NHANES]    Frequency of Communication with Friends and Family: Once a week    Frequency of Social Gatherings with Friends and Family: Patient declined    Attends Religious Services: More than 4 times per year    Active Member of [employer] or Organizations: No    Attends Engineer, structural: More than 4 times per year    Marital Status: Married  Careers information officer Violence: Not At Risk (10/21/2021)   Humiliation, Afraid, Rape, and Kick questionnaire    Fear of Current or Ex-Partner: No    Emotionally Abused: No    Physically Abused: No    Sexually Abused: No     Constitutional: Denies fever, malaise, fatigue, headache or abrupt weight changes.  Respiratory: Denies difficulty breathing, shortness of breath, cough or sputum production.   Cardiovascular: Denies chest pain, chest tightness, palpitations or swelling in the hands or feet.  Musculoskeletal: Patient reports swelling to  left great toe, difficulty with gait.  Denies decrease in range of motion, muscle pain or joint swelling.  Skin: Patient reports redness of left great toe.  Denies rashes, lesions or ulcercations.  Neurological: Denies dizziness, difficulty with memory, difficulty with speech or problems with balance and coordination.    No other specific complaints in a complete review of systems (except as listed in HPI above).      Objective:   Physical Exam BP 138/84 (BP Location: Left Arm, Patient Position: Sitting, Cuff Size: Normal)   Ht 5\' 8"  (1.727 m)   Wt 197 lb 9.6 oz (89.6 kg)   BMI 30.04 kg/m   Wt Readings from Last 3 Encounters:  06/26/23 213 lb 1.6 oz (96.7 kg)  05/09/23 207 lb (93.9 kg)  01/11/23 206 lb (93.4 kg)    General: Appears his stated age, obese, chronically ill-appearing, in NAD. Skin: Erythema noted at the base of the left great toe extending to the lateral portion of the nailbed with an open wound.  Black pigmentation noted at the base of the nailbed.  Cardiovascular: Tachycardic with normal rhythm.  Pedal pulse 2+ on the left. Pulmonary/Chest: Normal effort and positive vesicular breath sounds. No respiratory distress. No wheezes, rales or ronchi noted.  Musculoskeletal: In wheelchair today.  No prosthesis of the RLE amputation. Neurological: Alert and oriented.   BMET    Component Value Date/Time   NA 136 05/09/2023 1118   NA 139  12/23/2019 1447   K 4.2 05/09/2023 1118   CL 99 05/09/2023 1118   CO2 25 05/09/2023 1118   GLUCOSE 200 (H) 05/09/2023 1118   BUN 9 05/09/2023 1118   BUN 13 12/23/2019 1447   CREATININE 0.79 05/09/2023 1118   CALCIUM 9.7 05/09/2023 1118   GFRNONAA 100 12/23/2019 1447   GFRNONAA >89 12/09/2016 0934   GFRAA 116 12/23/2019 1447   GFRAA >89 12/09/2016 0934    Lipid Panel     Component Value Date/Time   CHOL 97 05/09/2023 1118   CHOL 139 12/23/2019 1447   TRIG 182 (H) 05/09/2023 1118   HDL 25 (L) 05/09/2023 1118   HDL 23 (L) 12/23/2019 1447   CHOLHDL 3.9 05/09/2023 1118   VLDL 29 12/09/2016 0934   LDLCALC 46 05/09/2023 1118    CBC    Component Value Date/Time   WBC 6.6 05/09/2023 1118   RBC 5.47 05/09/2023 1118   HGB 14.6 05/09/2023 1118   HGB 14.1 11/27/2018 1344   HCT 46.4 05/09/2023 1118   HCT 41.8 11/27/2018 1344   PLT 210 05/09/2023 1118   PLT 200 11/27/2018 1344   MCV 84.8 05/09/2023 1118   MCV 83 11/27/2018 1344   MCH 26.7 (L) 05/09/2023 1118   MCHC 31.5 (L) 05/09/2023 1118   RDW 13.8 05/09/2023 1118   RDW 13.1 11/27/2018 1344   LYMPHSABS 1.9 11/27/2018 1344   MONOABS 1.4 (H) 10/26/2018 2318   EOSABS 0.5 (H) 11/27/2018 1344   BASOSABS 0.2 11/27/2018 1344    Hgb A1C Lab Results  Component Value Date   HGBA1C 6.1 (H) 05/09/2023            Assessment & Plan:   Assessment and Plan    Paronychia, Left Great Toe Redness and swelling in the left toe around the nail bed for three weeks, likely infection worsened by antifungal treatment. Risk of progression to osteomyelitis, potentially requiring amputation if untreated. No diabetes or recent trauma. Importance of wearing socks to reduce moisture emphasized. - Prescribe Keflex  500 mg TID for 10 days - Advise daily Epsom salt foot soaks - Refer to podiatry for further evaluation and management - Instruct to report signs of worsening infection, such as spreading redness, fever, or chills - Advise wearing  socks to keep the area dry       RTC in 1 month for your annual exam Nicki Reaper, NP

## 2023-11-01 ENCOUNTER — Other Ambulatory Visit: Payer: Self-pay | Admitting: Internal Medicine

## 2023-11-01 DIAGNOSIS — K219 Gastro-esophageal reflux disease without esophagitis: Secondary | ICD-10-CM

## 2023-11-03 ENCOUNTER — Other Ambulatory Visit: Payer: Self-pay | Admitting: Internal Medicine

## 2023-11-03 DIAGNOSIS — I10 Essential (primary) hypertension: Secondary | ICD-10-CM

## 2023-11-03 NOTE — Telephone Encounter (Signed)
 Requested Prescriptions  Pending Prescriptions Disp Refills   omeprazole (PRILOSEC) 40 MG capsule [Pharmacy Med Name: OMEPRAZOLE 40MG  CAPSULES] 90 capsule 0    Sig: TAKE 1 CAPSULE(40 MG) BY MOUTH DAILY     Gastroenterology: Proton Pump Inhibitors Failed - 11/03/2023  9:52 AM      Failed - Valid encounter within last 12 months    Recent Outpatient Visits           4 days ago Paronychia of great toe of left foot   [place] [facility] [place], Salvadore Oxford, NP       Future Appointments             In 2 weeks Sampson Si, Salvadore Oxford, NP [place] [facility], [facility]

## 2023-11-06 NOTE — Telephone Encounter (Signed)
 Requested Prescriptions  Pending Prescriptions Disp Refills   losartan (COZAAR) 100 MG tablet [Pharmacy Med Name: LOSARTAN 100MG  TABLETS] 90 tablet 0    Sig: TAKE 1 TABLET(100 MG) BY MOUTH DAILY     Cardiovascular:  Angiotensin Receptor Blockers Failed - 11/06/2023  7:38 AM      Failed - Cr in normal range and within 180 days    Creat  Date Value Ref Range Status  05/09/2023 0.79 0.70 - 1.30 mg/dL Final         Failed - K in normal range and within 180 days    Potassium  Date Value Ref Range Status  05/09/2023 4.2 3.5 - 5.3 mmol/L Final         Failed - Valid encounter within last 6 months    Recent Outpatient Visits           1 week ago Paronychia of great toe of left foot   [facility] [place], Salvadore Oxford, NP       Future Appointments             In 2 weeks Sampson Si, Salvadore Oxford, NP [place] [facility], [facility]            Passed - Patient is not pregnant      Passed - Last BP in normal range    BP Readings from Last 1 Encounters:  10/30/23 138/84

## 2023-11-18 ENCOUNTER — Other Ambulatory Visit: Payer: Self-pay | Admitting: Internal Medicine

## 2023-11-20 NOTE — Telephone Encounter (Signed)
 Requested Prescriptions  Pending Prescriptions Disp Refills   atorvastatin  (LIPITOR) 10 MG tablet [Pharmacy Med Name: ATORVASTATIN  10MG  TABLETS] 90 tablet 1    Sig: TAKE 1 TABLET(10 MG) BY MOUTH DAILY     Cardiovascular:  Antilipid - Statins Failed - 11/20/2023 12:08 PM      Failed - Valid encounter within last 12 months    Recent Outpatient Visits           3 weeks ago Paronychia of great toe of left foot   [place] [facility] [place], Rankin Buzzard, NP       Future Appointments             Tomorrow Carollynn Cirri, NP [place] [facility], [facility]            Failed - Lipid Panel in normal range within the last 12 months    Cholesterol, Total  Date Value Ref Range Status  12/23/2019 139 100 - 199 mg/dL Final   Cholesterol  Date Value Ref Range Status  05/09/2023 97 <200 mg/dL Final   LDL Cholesterol (Calc)  Date Value Ref Range Status  05/09/2023 46 mg/dL (calc) Final    Comment:    Reference range: <100 . Desirable range <100 mg/dL for primary prevention;   <70 mg/dL for patients with CHD or diabetic patients  with > or = 2 CHD risk factors. Aaron Aas LDL-C is now calculated using the Martin-Hopkins  calculation, which is a validated novel method providing  better accuracy than the Friedewald equation in the  estimation of LDL-C.  Melinda Sprawls et al. Erroll Heard. 1610;960(45): 2061-2068  (http://education.[employer].com/faq/FAQ164)    HDL  Date Value Ref Range Status  05/09/2023 25 (L) > OR = 40 mg/dL Final  40/98/1191 23 (L) >39 mg/dL Final   Triglycerides  Date Value Ref Range Status  05/09/2023 182 (H) <150 mg/dL Final         Passed - Patient is not pregnant

## 2023-11-21 ENCOUNTER — Ambulatory Visit (INDEPENDENT_AMBULATORY_CARE_PROVIDER_SITE_OTHER): Payer: Self-pay | Admitting: Internal Medicine

## 2023-11-21 ENCOUNTER — Encounter: Payer: Self-pay | Admitting: Internal Medicine

## 2023-11-21 VITALS — BP 140/80 | Ht 68.0 in | Wt 209.6 lb

## 2023-11-21 DIAGNOSIS — E6609 Other obesity due to excess calories: Secondary | ICD-10-CM

## 2023-11-21 DIAGNOSIS — Z0001 Encounter for general adult medical examination with abnormal findings: Secondary | ICD-10-CM

## 2023-11-21 DIAGNOSIS — R7303 Prediabetes: Secondary | ICD-10-CM

## 2023-11-21 DIAGNOSIS — E781 Pure hyperglyceridemia: Secondary | ICD-10-CM | POA: Diagnosis not present

## 2023-11-21 DIAGNOSIS — Z6831 Body mass index (BMI) 31.0-31.9, adult: Secondary | ICD-10-CM

## 2023-11-21 DIAGNOSIS — J301 Allergic rhinitis due to pollen: Secondary | ICD-10-CM

## 2023-11-21 DIAGNOSIS — E66811 Obesity, class 1: Secondary | ICD-10-CM

## 2023-11-21 DIAGNOSIS — Z125 Encounter for screening for malignant neoplasm of prostate: Secondary | ICD-10-CM

## 2023-11-21 MED ORDER — LORATADINE 10 MG PO TABS: 10.0000 mg | ORAL_TABLET | Freq: Every day | ORAL | 3 refills | Status: AC

## 2023-11-21 NOTE — Patient Instructions (Signed)
 Health Maintenance, Male  Adopting a healthy lifestyle and getting preventive care are important in promoting health and wellness. Ask your health care provider about:  The right schedule for you to have regular tests and exams.  Things you can do on your own to prevent diseases and keep yourself healthy.  What should I know about diet, weight, and exercise?  Eat a healthy diet    Eat a diet that includes plenty of vegetables, fruits, low-fat dairy products, and lean protein.  Do not eat a lot of foods that are high in solid fats, added sugars, or sodium.  Maintain a healthy weight  Body mass index (BMI) is a measurement that can be used to identify possible weight problems. It estimates body fat based on height and weight. Your health care provider can help determine your BMI and help you achieve or maintain a healthy weight.  Get regular exercise  Get regular exercise. This is one of the most important things you can do for your health. Most adults should:  Exercise for at least 150 minutes each week. The exercise should increase your heart rate and make you sweat (moderate-intensity exercise).  Do strengthening exercises at least twice a week. This is in addition to the moderate-intensity exercise.  Spend less time sitting. Even light physical activity can be beneficial.  Watch cholesterol and blood lipids  Have your blood tested for lipids and cholesterol at 56 years of age, then have this test every 5 years.  You may need to have your cholesterol levels checked more often if:  Your lipid or cholesterol levels are high.  You are older than 56 years of age.  You are at high risk for heart disease.  What should I know about cancer screening?  Many types of cancers can be detected early and may often be prevented. Depending on your health history and family history, you may need to have cancer screening at various ages. This may include screening for:  Colorectal cancer.  Prostate cancer.  Skin cancer.  Lung  cancer.  What should I know about heart disease, diabetes, and high blood pressure?  Blood pressure and heart disease  High blood pressure causes heart disease and increases the risk of stroke. This is more likely to develop in people who have high blood pressure readings or are overweight.  Talk with your health care provider about your target blood pressure readings.  Have your blood pressure checked:  Every 3-5 years if you are 9-95 years of age.  Every year if you are 85 years old or older.  If you are between the ages of 29 and 29 and are a current or former smoker, ask your health care provider if you should have a one-time screening for abdominal aortic aneurysm (AAA).  Diabetes  Have regular diabetes screenings. This checks your fasting blood sugar level. Have the screening done:  Once every three years after age 23 if you are at a normal weight and have a low risk for diabetes.  More often and at a younger age if you are overweight or have a high risk for diabetes.  What should I know about preventing infection?  Hepatitis B  If you have a higher risk for hepatitis B, you should be screened for this virus. Talk with your health care provider to find out if you are at risk for hepatitis B infection.  Hepatitis C  Blood testing is recommended for:  Everyone born from [birth year] through 1963.  Anyone  with known risk factors for hepatitis C.  Sexually transmitted infections (STIs)  You should be screened each year for STIs, including gonorrhea and chlamydia, if:  You are sexually active and are younger than 56 years of age.  You are older than 56 years of age and your health care provider tells you that you are at risk for this type of infection.  Your sexual activity has changed since you were last screened, and you are at increased risk for chlamydia or gonorrhea. Ask your health care provider if you are at risk.  Ask your health care provider about whether you are at high risk for HIV. Your health care provider  may recommend a prescription medicine to help prevent HIV infection. If you choose to take medicine to prevent HIV, you should first get tested for HIV. You should then be tested every 3 months for as long as you are taking the medicine.  Follow these instructions at home:  Alcohol use  Do not drink alcohol if your health care provider tells you not to drink.  If you drink alcohol:  Limit how much you have to 0-2 drinks a day.  Know how much alcohol is in your drink. In the [place] one drink equals one 12 oz bottle of beer (355 mL), one 5 oz glass of wine (148 mL), or one 1 oz glass of hard liquor (44 mL).  Lifestyle  Do not use any products that contain nicotine or tobacco. These products include cigarettes, chewing tobacco, and vaping devices, such as e-cigarettes. If you need help quitting, ask your health care provider.  Do not use street drugs.  Do not share needles.  Ask your health care provider for help if you need support or information about quitting drugs.  General instructions  Schedule regular health, dental, and eye exams.  Stay current with your vaccines.  Tell your health care provider if:  You often feel depressed.  You have ever been abused or do not feel safe at home.  Summary  Adopting a healthy lifestyle and getting preventive care are important in promoting health and wellness.  Follow your health care provider's instructions about healthy diet, exercising, and getting tested or screened for diseases.  Follow your health care provider's instructions on monitoring your cholesterol and blood pressure.  This information is not intended to replace advice given to you by your health care provider. Make sure you discuss any questions you have with your health care provider.  Document Revised: 12/07/2020 Document Reviewed: 12/07/2020  Elsevier Patient Education  2024 [employer].

## 2023-11-21 NOTE — Progress Notes (Signed)
 Subjective:    Patient ID: Brandon Myers, male    DOB: 1966/07/12, 58 y.o.   MRN: [id number]  HPI  Patient presents to clinic today for his annual exam.  Flu: 05/2023 Tetanus: unsure COVID: never Pneumovax: 08/2021 Prevnar: 12/2016 Shingrix: Never PSA screening: 10/2022 Colon screening: 11/2022 Vision screening: as needed Dentist: as needed  Diet: He does eat some meat. He eats more fruits than veggies. He does eat some fried foods. He drinks mostly sweet tea. Exercise: None, disabled    Review of Systems  Past Medical History:  Diagnosis Date   Allergy    Anemia 03/03/2022   Anxiety 01/21/2020   Controlled substance agreement signed 04/02/2017   GERD (gastroesophageal reflux disease)    Hypertension    Neutropenia, congenital (HCC)    Phantom limb pain (HCC)     Current Outpatient Medications  Medication Sig Dispense Refill   acetaminophen  (TYLENOL ) 650 MG CR tablet Take 1,300 mg by mouth 2 (two) times daily as needed for pain.     albuterol  (VENTOLIN  HFA) 108 (90 Base) MCG/ACT inhaler INHALE 2 PUFFS BY MOUTH FOUR TIMES DAILY AS NEEDED (Patient not taking: Reported on 10/30/2023) 18 g 12   atorvastatin  (LIPITOR) 10 MG tablet TAKE 1 TABLET(10 MG) BY MOUTH DAILY 90 tablet 1   cephALEXin  (KEFLEX ) 500 MG capsule Take 1 capsule (500 mg total) by mouth 3 (three) times daily. 30 capsule 0   filgrastim-sndz (ZARXIO) 480 MCG/0.8ML SOSY injection Inject 0.8 mLs into the muscle every other day.     gabapentin  (NEURONTIN ) 600 MG tablet TAKE 1 TABLET(600 MG) BY MOUTH THREE TIMES DAILY 270 tablet 0   loratadine  (CLARITIN ) 10 MG tablet TAKE 1 TABLET(10 MG) BY MOUTH DAILY (Patient taking differently: daily as needed.) 90 tablet 3   losartan  (COZAAR ) 100 MG tablet TAKE 1 TABLET(100 MG) BY MOUTH DAILY 90 tablet 0   metoprolol  succinate (TOPROL -XL) 100 MG 24 hr tablet TAKE 1 TABLET BY MOUTH EVERY DAY AFTER A MEAL 90 tablet 1   naproxen  (NAPROSYN ) 500 MG tablet Take 1 tablet (500 mg total) by  mouth 2 (two) times daily with a meal. (Patient not taking: Reported on 10/30/2023) 14 tablet 0   nortriptyline  (PAMELOR ) 25 MG capsule TAKE 1 CAPSULE(25 MG) BY MOUTH AT BEDTIME 90 capsule 0   omeprazole  (PRILOSEC) 40 MG capsule TAKE 1 CAPSULE(40 MG) BY MOUTH DAILY 90 capsule 0   tiZANidine  (ZANAFLEX ) 4 MG tablet Take 1 tablet (4 mg total) by mouth at bedtime as needed for muscle spasms. 90 tablet 1   No current facility-administered medications for this visit.    Allergies  Allergen Reactions   Enalapril  Cough    ACEi Cough   Hydrochlorothiazide      Sweats, hot flashes   Prochlorperazine     Other reaction(s): Other (See Comments) As a child unable to recall   Sulfa Antibiotics Other (See Comments)    Other reaction(s): Other (See Comments) As a child unable to recall   Benadryl [Diphenhydramine]     Increased phantom pain   Strawberry Extract Rash    Family History  Problem Relation Age of Onset   Heart Problems Mother    Asthma Mother    Depression Mother    Thyroid  disease Mother    Alzheimer's disease Mother    Hypertension Mother    Stroke Maternal Grandmother    Cancer Maternal Grandmother    Cancer Father        skin   Hypertension Father  Cancer Paternal Grandmother    Cancer Paternal Grandfather     Social History   Socioeconomic History   Marital status: Married    Spouse name: Doyt Castellana   Number of children: 2   Years of education: Not on file   Highest education level: Associate degree: occupational, Scientist, product/process development, or vocational program  Occupational History   Occupation: Disabled  Tobacco Use   Smoking status: Never   Smokeless tobacco: Never  Vaping Use   Vaping status: Never Used  Substance and Sexual Activity   Alcohol use: Never    Alcohol/week: 0.0 standard drinks of alcohol   Drug use: Never   Sexual activity: Never  Other Topics Concern   Not on file  Social History Narrative   Not on file   Social Drivers of Health   Financial  Resource Strain: Medium Risk (11/20/2023)   Overall Financial Resource Strain (CARDIA)    Difficulty of Paying Living Expenses: Somewhat hard  Food Insecurity: Food Insecurity Present (11/20/2023)   Hunger Vital Sign    Worried About Running Out of Food in the Last Year: Sometimes true    Ran Out of Food in the Last Year: Sometimes true  Transportation Needs: No Transportation Needs (11/20/2023)   PRAPARE - Administrator, Civil Service (Medical): No    Lack of Transportation (Non-Medical): No  Physical Activity: Unknown (11/20/2023)   Exercise Vital Sign    Days of Exercise per Week: 0 days    Minutes of Exercise per Session: Not on file  Stress: No Stress Concern Present (11/20/2023)   [employer] of Occupational Health - Occupational Stress Questionnaire    Feeling of Stress : Only a little  Social Connections: Socially Integrated (11/20/2023)   Social Connection and Isolation Panel [NHANES]    Frequency of Communication with Friends and Family: More than three times a week    Frequency of Social Gatherings with Friends and Family: Once a week    Attends Religious Services: More than 4 times per year    Active Member of [employer] or Organizations: Yes    Attends Banker Meetings: 1 to 4 times per year    Marital Status: Married  Catering manager Violence: Not At Risk (10/21/2021)   Humiliation, Afraid, Rape, and Kick questionnaire    Fear of Current or Ex-Partner: No    Emotionally Abused: No    Physically Abused: No    Sexually Abused: No     Constitutional: Denies fever, malaise, fatigue, headache or abrupt weight changes.  HEENT: Denies eye pain, eye redness, ear pain, ringing in the ears, wax buildup, runny nose, nasal congestion, bloody nose, or sore throat. Respiratory: Denies difficulty breathing, shortness of breath, cough or sputum production.   Cardiovascular: Denies chest pain, chest tightness, palpitations or swelling in the hands or feet.   Gastrointestinal: Denies abdominal pain, bloating, diarrhea, constipation or blood in the stool.  GU: Denies urgency, frequency, pain with urination, burning sensation, blood in urine, odor or discharge. Musculoskeletal: Pt reports left foot pain. Denies decrease in range of motion, difficulty with gait, muscle pain or joint pain and swelling.  Skin: Denies redness, rashes, lesions or ulcercations.  Neurological: Pt reports paresthesia of left foot. Denies dizziness, difficulty with memory, difficulty with speech or problems with balance and coordination.  Psych: Patient has a history of anxiety.  Denies depression, SI/HI.  No other specific complaints in a complete review of systems (except as listed in HPI above).  Objective:   Physical Exam  BP (!) 142/84 (BP Location: Left Arm, Patient Position: Sitting, Cuff Size: Normal)   Ht 5\' 8"  (1.727 m)   Wt 209 lb 9.6 oz (95.1 kg)   BMI 31.87 kg/m    Wt Readings from Last 3 Encounters:  10/30/23 197 lb 9.6 oz (89.6 kg)  06/26/23 213 lb 1.6 oz (96.7 kg)  05/09/23 207 lb (93.9 kg)    General: Appears his stated age, obese, chronically ill appearing, in NAD. Skin: Warm, clammy and intact.  HEENT: Head: normal shape and size; Eyes: sclera white, no icterus, conjunctiva pink, PERRLA and EOMs intact;  Neck:  Neck supple, trachea midline. No masses, lumps or thyromegaly present.  Cardiovascular: Normal rate and rhythm. S1,S2 noted.  No murmur, rubs or gallops noted. No JVD. Trace pitting LLE edema. No carotid bruits noted. Pulmonary/Chest: Normal effort and positive vesicular breath sounds. No respiratory distress. No wheezes, rales or ronchi noted.  Abdomen: Normal bowel sounds.  Musculoskeletal: Absence of right upper extremity. Prosthesis noted of RLE. Limping gait with use of prosthesis. Neurological: Alert and oriented. Cranial nerves II-XII grossly intact. Coordination normal.  Psychiatric: Mood and affect normal. Behavior is  normal. Judgment and thought content normal.     BMET    Component Value Date/Time   NA 136 05/09/2023 1118   NA 139 12/23/2019 1447   K 4.2 05/09/2023 1118   CL 99 05/09/2023 1118   CO2 25 05/09/2023 1118   GLUCOSE 200 (H) 05/09/2023 1118   BUN 9 05/09/2023 1118   BUN 13 12/23/2019 1447   CREATININE 0.79 05/09/2023 1118   CALCIUM  9.7 05/09/2023 1118   GFRNONAA 100 12/23/2019 1447   GFRNONAA >89 12/09/2016 0934   GFRAA 116 12/23/2019 1447   GFRAA >89 12/09/2016 0934    Lipid Panel     Component Value Date/Time   CHOL 97 05/09/2023 1118   CHOL 139 12/23/2019 1447   TRIG 182 (H) 05/09/2023 1118   HDL 25 (L) 05/09/2023 1118   HDL 23 (L) 12/23/2019 1447   CHOLHDL 3.9 05/09/2023 1118   VLDL 29 12/09/2016 0934   LDLCALC 46 05/09/2023 1118    CBC    Component Value Date/Time   WBC 6.6 05/09/2023 1118   RBC 5.47 05/09/2023 1118   HGB 14.6 05/09/2023 1118   HGB 14.1 11/27/2018 1344   HCT 46.4 05/09/2023 1118   HCT 41.8 11/27/2018 1344   PLT 210 05/09/2023 1118   PLT 200 11/27/2018 1344   MCV 84.8 05/09/2023 1118   MCV 83 11/27/2018 1344   MCH 26.7 (L) 05/09/2023 1118   MCHC 31.5 (L) 05/09/2023 1118   RDW 13.8 05/09/2023 1118   RDW 13.1 11/27/2018 1344   LYMPHSABS 1.9 11/27/2018 1344   MONOABS 1.4 (H) 10/26/2018 2318   EOSABS 0.5 (H) 11/27/2018 1344   BASOSABS 0.2 11/27/2018 1344    Hgb A1C Lab Results  Component Value Date   HGBA1C 6.1 (H) 05/09/2023           Assessment & Plan:   Preventative Health Maintenance:  Encouraged him to get a flu shot in the fall He declines tetanus for financial reasons, advised him if he gets better To go get this done Pneumovax and Prevnar UTD Encouraged him to get his COVID-vaccine Discussed Shingrix vaccine, he will check coverage with his insurance company and schedule visit at the pharmacy if he would like to have this done Colon screening UTD Encouraged him to consume a balanced diet  and exercise  regimen Advised him to see an eye doctor and dentist annually We will check CBC, c-Met, lipid, A1c and PSA today    RTC in 6 months, follow-up chronic conditions Helayne Lo, NP

## 2023-11-21 NOTE — Assessment & Plan Note (Signed)
 Encouraged diet and exercise for weight loss ?

## 2023-11-22 ENCOUNTER — Ambulatory Visit: Payer: Self-pay | Admitting: Podiatry

## 2023-11-22 ENCOUNTER — Encounter: Payer: Self-pay | Admitting: Internal Medicine

## 2023-11-22 ENCOUNTER — Encounter: Payer: Self-pay | Admitting: Podiatry

## 2023-11-22 DIAGNOSIS — L03032 Cellulitis of left toe: Secondary | ICD-10-CM

## 2023-11-22 LAB — COMPREHENSIVE METABOLIC PANEL WITH GFR
AG Ratio: 1.5 (calc) (ref 1.0–2.5)
ALT: 25 U/L (ref 9–46)
AST: 34 U/L (ref 10–35)
Albumin: 4.2 g/dL (ref 3.6–5.1)
Alkaline phosphatase (APISO): 105 U/L (ref 35–144)
BUN: 16 mg/dL (ref 7–25)
CO2: 28 mmol/L (ref 20–32)
Calcium: 9.5 mg/dL (ref 8.6–10.3)
Chloride: 102 mmol/L (ref 98–110)
Creat: 0.91 mg/dL (ref 0.70–1.30)
Globulin: 2.8 g/dL (ref 1.9–3.7)
Glucose, Bld: 114 mg/dL (ref 65–139)
Potassium: 4.6 mmol/L (ref 3.5–5.3)
Sodium: 137 mmol/L (ref 135–146)
Total Bilirubin: 0.6 mg/dL (ref 0.2–1.2)
Total Protein: 7 g/dL (ref 6.1–8.1)
eGFR: 100 mL/min/{1.73_m2} (ref >=60)

## 2023-11-22 LAB — CBC
HCT: 36.6 % — ABNORMAL LOW (ref 38.5–50.0)
Hemoglobin: 11.2 g/dL — ABNORMAL LOW (ref 13.2–17.1)
MCH: 24.7 pg — ABNORMAL LOW (ref 27.0–33.0)
MCHC: 30.6 g/dL — ABNORMAL LOW (ref 32.0–36.0)
MCV: 80.6 fL (ref 80.0–100.0)
MPV: 10.8 fL (ref 7.5–12.5)
Platelets: 183 10*3/uL (ref 140–400)
RBC: 4.54 10*6/uL (ref 4.20–5.80)
RDW: 14.2 % (ref 11.0–15.0)
WBC: 5.3 10*3/uL (ref 3.8–10.8)

## 2023-11-22 LAB — LIPID PANEL
Cholesterol: 91 mg/dL (ref <200)
HDL: 18 mg/dL — ABNORMAL LOW (ref >=40)
LDL Cholesterol (Calc): 49 mg/dL
Non-HDL Cholesterol (Calc): 73 mg/dL (ref <130)
Total CHOL/HDL Ratio: 5.1 (calc) — ABNORMAL HIGH (ref <5.0)
Triglycerides: 154 mg/dL — ABNORMAL HIGH (ref <150)

## 2023-11-22 LAB — PSA: PSA: 0.08 ng/mL (ref <=4.00)

## 2023-11-22 LAB — HEMOGLOBIN A1C
Hgb A1c MFr Bld: 6.1 % — ABNORMAL HIGH (ref <5.7)
Mean Plasma Glucose: 128 mg/dL
eAG (mmol/L): 7.1 mmol/L

## 2023-11-22 MED ORDER — CEPHALEXIN 500 MG PO CAPS: 500.0000 mg | ORAL_CAPSULE | Freq: Two times a day (BID) | ORAL | 0 refills | Status: AC

## 2023-11-22 MED ORDER — NEOMYCIN-POLYMYXIN-HC 1 % OT SOLN: OTIC | 1 refills | Status: DC

## 2023-11-22 NOTE — Patient Instructions (Signed)

## 2023-11-22 NOTE — Progress Notes (Signed)
 Subjective:  Patient ID: Brandon Myers, male    DOB: 01-Jun-1966,  MRN: [id number] HPI Chief Complaint  Patient presents with   Ingrown Toenail    "I have a problem with my toenail.  I thought I had fungus." N - toenail L - hallux left D - 3 weeks O - suddenly C - tender, pus A - none T - Antibiotic - Cephalexin , bandaids, soak in Epsom Salt   Numbness    "I get numbness in my foot." N - numbness L - left D - since I started taking Gabapentin  O - suddenly C - numbness A - none T - Gabapentin     58 y.o. male presents with the above complaint.   ROS: Denies fever chills nausea vomit muscle aches pains calf pain back pain chest pain shortness of breath.  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  Past Medical History:  Diagnosis Date   Allergy    Anemia 03/03/2022   Anxiety 01/21/2020   Controlled substance agreement signed 04/02/2017   GERD (gastroesophageal reflux disease)    Hypertension    Neutropenia, congenital (HCC)    Phantom limb pain (HCC)    Past Surgical History:  Procedure Laterality Date   ABOVE ELBOW ARM AMPUTATION Right 1993   AMPUTATION     APPENDECTOMY     COLON SURGERY  1994   COLONOSCOPY WITH PROPOFOL  N/A 12/28/2022   Procedure: COLONOSCOPY WITH PROPOFOL ;  Surgeon: Luke Salaam, MD;  Location[facility] ENDOSCOPY;  Service: Gastroenterology;  Laterality: N/A;   LEG AMPUTATION THROUGH KNEE Right 1993    Current Outpatient Medications:    acetaminophen  (TYLENOL ) 650 MG CR tablet, Take 1,300 mg by mouth 2 (two) times daily as needed for pain., Disp: , Rfl:    atorvastatin  (LIPITOR) 10 MG tablet, TAKE 1 TABLET(10 MG) BY MOUTH DAILY, Disp: 90 tablet, Rfl: 1   cephALEXin  (KEFLEX ) 500 MG capsule, Take 1 capsule (500 mg total) by mouth 3 (three) times daily., Disp: 30 capsule, Rfl: 0   cephALEXin  (KEFLEX ) 500 MG capsule, Take 1 capsule (500 mg total) by mouth 2 (two) times daily for 10 days., Disp: 20  capsule, Rfl: 0   filgrastim-sndz (ZARXIO) 480 MCG/0.8ML SOSY injection, Inject 0.8 mLs into the muscle every other day., Disp: , Rfl:    gabapentin  (NEURONTIN ) 600 MG tablet, TAKE 1 TABLET(600 MG) BY MOUTH THREE TIMES DAILY, Disp: 270 tablet, Rfl: 0   loratadine  (CLARITIN ) 10 MG tablet, Take 1 tablet (10 mg total) by mouth daily. TAKE 1 TABLET(10 MG) BY MOUTH DAILY, Disp: 90 tablet, Rfl: 3   losartan  (COZAAR ) 100 MG tablet, TAKE 1 TABLET(100 MG) BY MOUTH DAILY, Disp: 90 tablet, Rfl: 0   metoprolol  succinate (TOPROL -XL) 100 MG 24 hr tablet, TAKE 1 TABLET BY MOUTH EVERY DAY AFTER A MEAL, Disp: 90 tablet, Rfl: 1   NEOMYCIN -POLYMYXIN-HYDROCORTISONE (CORTISPORIN) 1 % SOLN OTIC solution, Apply 1-2 drops to the toenail(s) after soaking., Disp: 10 mL, Rfl: 1   nortriptyline  (PAMELOR ) 25 MG capsule, TAKE 1 CAPSULE(25 MG) BY MOUTH AT BEDTIME, Disp: 90 capsule, Rfl: 0   omeprazole  (PRILOSEC) 40 MG capsule, TAKE 1 CAPSULE(40 MG) BY MOUTH DAILY, Disp: 90 capsule, Rfl: 0   tiZANidine  (ZANAFLEX ) 4 MG tablet, Take 1 tablet (4 mg total) by mouth at bedtime as needed for muscle spasms., Disp: 90 tablet, Rfl: 1   albuterol  (VENTOLIN  HFA) 108 (90 Base) MCG/ACT inhaler, INHALE 2 PUFFS BY MOUTH  FOUR TIMES DAILY AS NEEDED (Patient not taking: Reported on 10/30/2023), Disp: 18 g, Rfl: 12  Allergies  Allergen Reactions   Enalapril  Cough    ACEi Cough   Hydrochlorothiazide      Sweats, hot flashes   Prochlorperazine     Other reaction(s): Other (See Comments) As a child unable to recall   Sulfa Antibiotics Other (See Comments)    Other reaction(s): Other (See Comments) As a child unable to recall   Benadryl [Diphenhydramine]     Increased phantom pain   Strawberry Extract Rash   Review of Systems Objective:  There were no vitals filed for this visit.  General: Well developed, nourished, in no acute distress, alert and oriented x3   Dermatological: Skin is warm, dry and supple bilateral. Nails x 10 are well  maintained; remaining integument appears unremarkable at this time. There are no open sores, no preulcerative lesions, no rash or signs of infection present.  Ingrown toenail fibular border hallux left was moderate paronychia with cellulitis extending to the level of the interphalangeal joint.  Moderately tender on palpation.    Vascular: Dorsalis Pedis artery and Posterior Tibial artery pedal pulses are 2/4 bilateral with immedate capillary fill time. Pedal hair growth present. No varicosities and no lower extremity edema present bilateral.   Neruologic: Grossly intact via light touch bilateral. Vibratory intact via tuning fork bilateral. Protective threshold with Semmes Wienstein monofilament intact to all pedal sites bilateral. Patellar and Achilles deep tendon reflexes 2+ bilateral. No Babinski or clonus noted bilateral.   Musculoskeletal: No gross boney pedal deformities bilateral. No pain, crepitus, or limitation noted with foot and ankle range of motion bilateral. Muscular strength 5/5 in all groups tested bilateral.  Gait: Unassisted, Nonantalgic.    Radiographs:  None taken  Assessment & Plan:   Assessment: Paronychia cellulitis hallux left  Plan: I&D was performed with a partial temporary nail avulsion hallux left after local anesthetic was administered was given both oral and written home-going instructions for the care and soaking of the toe as well as a prescription for Keflex  500 mg 1 p.o. twice daily and Cortisporin otic to be applied twice daily after soaking.  Will follow-up with him in 2 weeks.     Tayte Childers T. [place], [place]

## 2023-11-23 NOTE — Addendum Note (Signed)
 Addended by: Sanda Crome on: 11/23/2023 08:35 AM   Modules accepted: Level of Service

## 2023-12-06 ENCOUNTER — Encounter: Payer: Self-pay | Admitting: Podiatry

## 2023-12-06 ENCOUNTER — Ambulatory Visit: Admitting: Podiatry

## 2023-12-06 DIAGNOSIS — L03032 Cellulitis of left toe: Secondary | ICD-10-CM | POA: Diagnosis not present

## 2023-12-06 NOTE — Patient Instructions (Signed)
 Right through the air and okay to check computer okay

## 2023-12-06 NOTE — Progress Notes (Unsigned)
 He presents today for follow-up of his paronychia left states that is looking and feeling better he finished up his antibiotics.  Objective: Vital signs stable alert oriented x 3.  There is no erythema edema salines drainage or odor no purulence.  Assessment: Well-healing surgical toe.  Plan: Continue to soak for about another week Epsom salts and warm water just to assure that there is no residual infection any drainage or started become painful and more red is to call us  immediately we will go ahead and send him an antibiotic and then get him in here to be evaluated.

## 2024-01-01 ENCOUNTER — Other Ambulatory Visit: Payer: Self-pay | Admitting: Internal Medicine

## 2024-01-02 NOTE — Telephone Encounter (Signed)
 Requested Prescriptions  Pending Prescriptions Disp Refills   gabapentin  (NEURONTIN ) 600 MG tablet [Pharmacy Med Name: GABAPENTIN  600MG  TABLETS] 270 tablet 0    Sig: TAKE 1 TABLET(600 MG) BY MOUTH THREE TIMES DAILY     Neurology: Anticonvulsants - gabapentin  Passed - 01/02/2024 12:27 PM      Passed - Cr in normal range and within 360 days    Creat  Date Value Ref Range Status  11/21/2023 0.91 0.70 - 1.30 mg/dL Final         Passed - Completed PHQ-2 or PHQ-9 in the last 360 days      Passed - Valid encounter within last 12 months    Recent Outpatient Visits           1 month ago Encounter for general adult medical examination with abnormal findings   [place] [facility] [place], Rankin Buzzard, NP   2 months ago Paronychia of great toe of left foot   [place] [facility] [place], Rankin Buzzard, [place]

## 2024-01-22 ENCOUNTER — Other Ambulatory Visit: Payer: Self-pay | Admitting: Internal Medicine

## 2024-01-22 DIAGNOSIS — K219 Gastro-esophageal reflux disease without esophagitis: Secondary | ICD-10-CM

## 2024-01-24 NOTE — Telephone Encounter (Signed)
 Requested Prescriptions  Pending Prescriptions Disp Refills   omeprazole  (PRILOSEC) 40 MG capsule [Pharmacy Med Name: OMEPRAZOLE  40MG  CAPSULES] 90 capsule 0    Sig: TAKE 1 CAPSULE(40 MG) BY MOUTH DAILY     Gastroenterology: Proton Pump Inhibitors Passed - 01/24/2024  9:57 AM      Passed - Valid encounter within last 12 months    Recent Outpatient Visits           2 months ago Encounter for general adult medical examination with abnormal findings   [place] [facility] [place], Angeline ORN, NP   2 months ago Paronychia of great toe of left foot   [place] [facility] [place], Angeline ORN, [place]

## 2024-02-01 ENCOUNTER — Other Ambulatory Visit: Payer: Self-pay | Admitting: Internal Medicine

## 2024-02-01 DIAGNOSIS — I10 Essential (primary) hypertension: Secondary | ICD-10-CM

## 2024-02-05 NOTE — Telephone Encounter (Signed)
 Requested Prescriptions  Pending Prescriptions Disp Refills   losartan  (COZAAR ) 100 MG tablet [Pharmacy Med Name: LOSARTAN  100MG  TABLETS] 90 tablet 0    Sig: TAKE 1 TABLET(100 MG) BY MOUTH DAILY     Cardiovascular:  Angiotensin Receptor Blockers Failed - 02/05/2024  3:59 PM      Failed - Last BP in normal range    BP Readings from Last 1 Encounters:  11/21/23 (!) 140/80         Passed - Cr in normal range and within 180 days    Creat  Date Value Ref Range Status  11/21/2023 0.91 0.70 - 1.30 mg/dL Final         Passed - K in normal range and within 180 days    Potassium  Date Value Ref Range Status  11/21/2023 4.6 3.5 - 5.3 mmol/L Final         Passed - Patient is not pregnant      Passed - Valid encounter within last 6 months    Recent Outpatient Visits           2 months ago Encounter for general adult medical examination with abnormal findings   [place] [facility] [place], Angeline ORN, NP   3 months ago Paronychia of great toe of left foot   [place] [facility] [place], Angeline ORN, [place]

## 2024-02-12 ENCOUNTER — Other Ambulatory Visit: Payer: Self-pay | Admitting: Internal Medicine

## 2024-02-19 ENCOUNTER — Telehealth: Payer: Self-pay

## 2024-02-19 DIAGNOSIS — S88919A Complete traumatic amputation of unspecified lower leg, level unspecified, initial encounter: Secondary | ICD-10-CM

## 2024-02-19 NOTE — Addendum Note (Signed)
 Addended by: ANTONETTE ANGELINE ORN on: 02/19/2024 01:13 PM   Modules accepted: Orders

## 2024-02-19 NOTE — Telephone Encounter (Signed)
 Spoke with patient, he stated we just need to send a prescription for liners to [facility] in [place].

## 2024-02-19 NOTE — Telephone Encounter (Signed)
 RX printed and signed

## 2024-02-19 NOTE — Telephone Encounter (Signed)
 Copied from CRM [id number]. Topic: Clinical - Order For Equipment >> Feb 19, 2024 11:43 AM Vena H wrote: Reason for CRM: Pt's wife called in stating pt needs a prescription for liners for his prosthetic leg.

## 2024-02-19 NOTE — Telephone Encounter (Signed)
 Rx faxed

## 2024-02-19 NOTE — Telephone Encounter (Signed)
 I have never done this. Does he have a home health or prosthesis company he uses? Could they fax me over an order form?

## 2024-02-27 ENCOUNTER — Inpatient Hospital Stay
Admission: EM | Admit: 2024-02-27 | Discharge: 2024-02-29 | DRG: 935 | Disposition: A | Discharge status: Home / Self Care | Attending: Internal Medicine | Admitting: Internal Medicine

## 2024-02-27 ENCOUNTER — Inpatient Hospital Stay

## 2024-02-27 ENCOUNTER — Other Ambulatory Visit: Payer: Self-pay

## 2024-02-27 DIAGNOSIS — K219 Gastro-esophageal reflux disease without esophagitis: Secondary | ICD-10-CM | POA: Diagnosis present

## 2024-02-27 DIAGNOSIS — L97529 Non-pressure chronic ulcer of other part of left foot with unspecified severity: Secondary | ICD-10-CM | POA: Diagnosis present

## 2024-02-27 DIAGNOSIS — Z823 Family history of stroke: Secondary | ICD-10-CM

## 2024-02-27 DIAGNOSIS — Z89611 Acquired absence of right leg above knee: Secondary | ICD-10-CM

## 2024-02-27 DIAGNOSIS — B9689 Other specified bacterial agents as the cause of diseases classified elsewhere: Secondary | ICD-10-CM | POA: Diagnosis present

## 2024-02-27 DIAGNOSIS — Z809 Family history of malignant neoplasm, unspecified: Secondary | ICD-10-CM | POA: Diagnosis not present

## 2024-02-27 DIAGNOSIS — L03116 Cellulitis of left lower limb: Secondary | ICD-10-CM | POA: Diagnosis present

## 2024-02-27 DIAGNOSIS — I10 Essential (primary) hypertension: Secondary | ICD-10-CM | POA: Diagnosis present

## 2024-02-27 DIAGNOSIS — Z882 Allergy status to sulfonamides status: Secondary | ICD-10-CM | POA: Diagnosis not present

## 2024-02-27 DIAGNOSIS — Z825 Family history of asthma and other chronic lower respiratory diseases: Secondary | ICD-10-CM | POA: Diagnosis not present

## 2024-02-27 DIAGNOSIS — Z820 Family history of epilepsy and other diseases of the nervous system: Secondary | ICD-10-CM

## 2024-02-27 DIAGNOSIS — Z8249 Family history of ischemic heart disease and other diseases of the circulatory system: Secondary | ICD-10-CM

## 2024-02-27 DIAGNOSIS — X19XXXA Contact with other heat and hot substances, initial encounter: Secondary | ICD-10-CM | POA: Diagnosis present

## 2024-02-27 DIAGNOSIS — L0390 Cellulitis, unspecified: Secondary | ICD-10-CM | POA: Insufficient documentation

## 2024-02-27 DIAGNOSIS — Z9102 Food additives allergy status: Secondary | ICD-10-CM | POA: Diagnosis not present

## 2024-02-27 DIAGNOSIS — G546 Phantom limb syndrome with pain: Secondary | ICD-10-CM | POA: Diagnosis present

## 2024-02-27 DIAGNOSIS — Z23 Encounter for immunization: Secondary | ICD-10-CM | POA: Diagnosis present

## 2024-02-27 DIAGNOSIS — S88919A Complete traumatic amputation of unspecified lower leg, level unspecified, initial encounter: Secondary | ICD-10-CM

## 2024-02-27 DIAGNOSIS — D700 Congenital agranulocytosis: Secondary | ICD-10-CM | POA: Diagnosis present

## 2024-02-27 DIAGNOSIS — Z89221 Acquired absence of right upper limb above elbow: Secondary | ICD-10-CM

## 2024-02-27 DIAGNOSIS — D849 Immunodeficiency, unspecified: Secondary | ICD-10-CM | POA: Diagnosis present

## 2024-02-27 DIAGNOSIS — D509 Iron deficiency anemia, unspecified: Secondary | ICD-10-CM | POA: Diagnosis present

## 2024-02-27 DIAGNOSIS — S48911A Complete traumatic amputation of right shoulder and upper arm, level unspecified, initial encounter: Secondary | ICD-10-CM

## 2024-02-27 DIAGNOSIS — Z79899 Other long term (current) drug therapy: Secondary | ICD-10-CM

## 2024-02-27 DIAGNOSIS — Z818 Family history of other mental and behavioral disorders: Secondary | ICD-10-CM

## 2024-02-27 DIAGNOSIS — Z8349 Family history of other endocrine, nutritional and metabolic diseases: Secondary | ICD-10-CM | POA: Diagnosis not present

## 2024-02-27 DIAGNOSIS — T25122A Burn of first degree of left foot, initial encounter: Secondary | ICD-10-CM | POA: Diagnosis present

## 2024-02-27 DIAGNOSIS — Z888 Allergy status to other drugs, medicaments and biological substances status: Secondary | ICD-10-CM

## 2024-02-27 LAB — BASIC METABOLIC PANEL WITH GFR
Anion gap: 9 (ref 5–15)
BUN: 13 mg/dL (ref 6–20)
CO2: 23 mmol/L (ref 22–32)
Calcium: 9.4 mg/dL (ref 8.9–10.3)
Chloride: 103 mmol/L (ref 98–111)
Creatinine, Ser: 0.89 mg/dL (ref 0.61–1.24)
GFR, Estimated: >60 mL/min (ref >60)
Glucose, Bld: 110 mg/dL — ABNORMAL HIGH (ref 70–99)
Potassium: 4.3 mmol/L (ref 3.5–5.1)
Sodium: 135 mmol/L (ref 135–145)

## 2024-02-27 LAB — CBC WITH DIFFERENTIAL/PLATELET
Abs Immature Granulocytes: 0.03 K/uL (ref 0.00–0.07)
Basophils Absolute: 0.1 K/uL (ref 0.0–0.1)
Basophils Relative: 1 %
Eosinophils Absolute: 0.3 K/uL (ref 0.0–0.5)
Eosinophils Relative: 6 %
HCT: 35.1 % — ABNORMAL LOW (ref 39.0–52.0)
Hemoglobin: 10.7 g/dL — ABNORMAL LOW (ref 13.0–17.0)
Immature Granulocytes: 1 %
Lymphocytes Relative: 45 %
Lymphs Abs: 2 K/uL (ref 0.7–4.0)
MCH: 24 pg — ABNORMAL LOW (ref 26.0–34.0)
MCHC: 30.5 g/dL (ref 30.0–36.0)
MCV: 78.9 fL — ABNORMAL LOW (ref 80.0–100.0)
Monocytes Absolute: 1.2 K/uL — ABNORMAL HIGH (ref 0.1–1.0)
Monocytes Relative: 27 %
Neutro Abs: 0.9 K/uL — ABNORMAL LOW (ref 1.7–7.7)
Neutrophils Relative %: 20 %
Platelets: 146 K/uL — ABNORMAL LOW (ref 150–400)
RBC: 4.45 MIL/uL (ref 4.22–5.81)
RDW: 14.6 % (ref 11.5–15.5)
Smear Review: NORMAL
WBC: 4.5 K/uL (ref 4.0–10.5)
nRBC: 0 % (ref 0.0–0.2)

## 2024-02-27 LAB — MRSA NEXT GEN BY PCR, NASAL: MRSA by PCR Next Gen: NOT DETECTED

## 2024-02-27 LAB — PATHOLOGIST SMEAR REVIEW

## 2024-02-27 LAB — LACTIC ACID, PLASMA: Lactic Acid, Venous: 1.7 mmol/L (ref 0.5–1.9)

## 2024-02-27 MED ORDER — GABAPENTIN 300 MG PO CAPS
600.0000 mg | ORAL_CAPSULE | Freq: Three times a day (TID) | ORAL | Status: DC
  Administered 2024-02-27 – 2024-02-29 (×6): 600 mg via ORAL
  Filled 2024-02-27 (×6): qty 2

## 2024-02-27 MED ORDER — OXYCODONE HCL 5 MG PO TABS
5.0000 mg | ORAL_TABLET | Freq: Four times a day (QID) | ORAL | Status: DC | PRN
  Administered 2024-02-27 – 2024-02-29 (×4): 5 mg via ORAL
  Filled 2024-02-27 (×4): qty 1

## 2024-02-27 MED ORDER — BACITRACIN ZINC 500 UNIT/GM EX OINT
TOPICAL_OINTMENT | Freq: Once | CUTANEOUS | Status: AC
  Administered 2024-02-27: 1 via TOPICAL
  Filled 2024-02-27: qty 0.9

## 2024-02-27 MED ORDER — PANTOPRAZOLE SODIUM 40 MG PO TBEC
40.0000 mg | DELAYED_RELEASE_TABLET | Freq: Every day | ORAL | Status: DC
  Administered 2024-02-27 – 2024-02-29 (×3): 40 mg via ORAL
  Filled 2024-02-27 (×3): qty 1

## 2024-02-27 MED ORDER — TETANUS-DIPHTH-ACELL PERTUSSIS 5-2.5-18.5 LF-MCG/0.5 IM SUSY
0.5000 mL | PREFILLED_SYRINGE | Freq: Once | INTRAMUSCULAR | Status: AC
  Administered 2024-02-27: 0.5 mL via INTRAMUSCULAR
  Filled 2024-02-27: qty 0.5

## 2024-02-27 MED ORDER — ONDANSETRON HCL 4 MG/2ML IJ SOLN
4.0000 mg | Freq: Once | INTRAMUSCULAR | Status: AC
  Administered 2024-02-27: 4 mg via INTRAVENOUS
  Filled 2024-02-27: qty 2

## 2024-02-27 MED ORDER — LOSARTAN POTASSIUM 50 MG PO TABS
100.0000 mg | ORAL_TABLET | Freq: Every day | ORAL | Status: DC
  Administered 2024-02-27 – 2024-02-29 (×3): 100 mg via ORAL
  Filled 2024-02-27 (×3): qty 2

## 2024-02-27 MED ORDER — LORATADINE 10 MG PO TABS
10.0000 mg | ORAL_TABLET | Freq: Every day | ORAL | Status: DC
Start: 2024-02-27 — End: 2024-02-29
  Administered 2024-02-27 – 2024-02-29 (×3): 10 mg via ORAL
  Filled 2024-02-27 (×3): qty 1

## 2024-02-27 MED ORDER — ATORVASTATIN CALCIUM 10 MG PO TABS
10.0000 mg | ORAL_TABLET | Freq: Every day | ORAL | Status: DC
Start: 2024-02-27 — End: 2024-02-29
  Administered 2024-02-27 – 2024-02-29 (×3): 10 mg via ORAL
  Filled 2024-02-27 (×3): qty 1

## 2024-02-27 MED ORDER — FILGRASTIM-SNDZ 480 MCG/0.8ML IJ SOSY
480.0000 ug | PREFILLED_SYRINGE | Freq: Once | INTRAMUSCULAR | Status: AC
  Administered 2024-02-27: 480 ug via SUBCUTANEOUS
  Filled 2024-02-27: qty 0.8

## 2024-02-27 MED ORDER — SODIUM CHLORIDE 0.9 % IV SOLN
2.0000 g | Freq: Once | INTRAVENOUS | Status: AC
  Administered 2024-02-27: 2 g via INTRAVENOUS
  Filled 2024-02-27: qty 20

## 2024-02-27 MED ORDER — METOPROLOL SUCCINATE ER 50 MG PO TB24
100.0000 mg | ORAL_TABLET | Freq: Every day | ORAL | Status: DC
  Administered 2024-02-27 – 2024-02-28 (×2): 100 mg via ORAL
  Filled 2024-02-27 (×2): qty 2

## 2024-02-27 MED ORDER — ONDANSETRON HCL 4 MG/2ML IJ SOLN
4.0000 mg | Freq: Four times a day (QID) | INTRAMUSCULAR | Status: DC | PRN
  Administered 2024-02-27: 4 mg via INTRAVENOUS
  Filled 2024-02-27 (×2): qty 2

## 2024-02-27 MED ORDER — VANCOMYCIN HCL 2000 MG/400ML IV SOLN
2000.0000 mg | Freq: Once | INTRAVENOUS | Status: AC
  Administered 2024-02-27: 2000 mg via INTRAVENOUS
  Filled 2024-02-27: qty 400

## 2024-02-27 MED ORDER — TIZANIDINE HCL 4 MG PO TABS: 4.0000 mg | ORAL_TABLET | Freq: Every evening | ORAL | Status: DC | PRN

## 2024-02-27 MED ORDER — MORPHINE SULFATE (PF) 4 MG/ML IV SOLN
4.0000 mg | Freq: Once | INTRAVENOUS | Status: AC
  Administered 2024-02-27: 4 mg via INTRAVENOUS
  Filled 2024-02-27: qty 1

## 2024-02-27 MED ORDER — ENOXAPARIN SODIUM 40 MG/0.4ML IJ SOSY
40.0000 mg | PREFILLED_SYRINGE | INTRAMUSCULAR | Status: DC
  Administered 2024-02-27 – 2024-02-28 (×2): 40 mg via SUBCUTANEOUS
  Filled 2024-02-27 (×2): qty 0.4

## 2024-02-27 MED ORDER — CEFAZOLIN SODIUM-DEXTROSE 2-4 GM/100ML-% IV SOLN
2.0000 g | Freq: Three times a day (TID) | INTRAVENOUS | Status: DC
  Administered 2024-02-27 – 2024-02-29 (×6): 2 g via INTRAVENOUS
  Filled 2024-02-27 (×6): qty 100

## 2024-02-27 NOTE — Plan of Care (Signed)
  Problem: Education: Goal: Knowledge of General Education information will improve Description: Including pain rating scale, medication(s)/side effects and non-pharmacologic comfort measures Outcome: Progressing   Problem: Safety: Goal: Ability to remain free from injury will improve Outcome: Progressing   Problem: Skin Integrity: Goal: Risk for impaired skin integrity will decrease Outcome: Progressing   Problem: Clinical Measurements: Goal: Ability to avoid or minimize complications of infection will improve Outcome: Progressing   Problem: Skin Integrity: Goal: Skin integrity will improve Outcome: Progressing
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
## 2024-02-27 NOTE — Plan of Care (Signed)
  Problem: Health Behavior/Discharge Planning: Goal: Ability to manage health-related needs will improve Outcome: Progressing   Problem: Nutrition: Goal: Adequate nutrition will be maintained Outcome: Progressing   Problem: Coping: Goal: Level of anxiety will decrease Outcome: Progressing   Problem: Elimination: Goal: Will not experience complications related to urinary retention Outcome: Progressing   Problem: Pain Managment: Goal: General experience of comfort will improve and/or be controlled Outcome: Progressing   Problem: Safety: Goal: Ability to remain free from injury will improve Outcome: Progressing

## 2024-02-27 NOTE — Progress Notes (Signed)
 ED Pharmacy Antibiotic Sign Off An antibiotic consult was received from an ED provider for Vancomycin  per pharmacy dosing for cellulitis. A chart review was completed to assess appropriateness.   The following one time order(s) were placed:  Vancomycin  2 gm IV X 1   Further antibiotic and/or antibiotic pharmacy consults should be ordered by the admitting provider if indicated.   Thank you for allowing pharmacy to be a part of this patient's care.   Silver Selinda BIRCH [facility]  Clinical Pharmacist 02/27/24 5:25 AM

## 2024-02-27 NOTE — ED Notes (Signed)
 XR at bedside

## 2024-02-27 NOTE — Consult Note (Addendum)
 WOC Nurse Consult Note: Reason for Consult: Requested to assess a left foot wound. No history of Diabetes. Wound type: Plantar foot on the metatarsal head area. Rupture blister. Pressure Injury POA: NA Measurement: aprox. 8 cm x 6 cmx 0.1 cm Wound bed: 100% red. Drainage (amount, consistency, odor)  Periwound: intact Dressing procedure/placement/frequency: Cleanse with Vashe #[id number], not rinse, pat dry the peri-wounds skin. Apply Xeroform on the wound bed. Cover with foam dressing or non adherent pad. Wrap with Kerlix.  WOC team will not plan to follow further. Please reconsult if further assistance is needed. Thank-you,  Lela Holm BSN, CNS, RN, [employer], WOCN  (Phone [phone])
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
## 2024-02-28 DIAGNOSIS — T25122A Burn of first degree of left foot, initial encounter: Secondary | ICD-10-CM | POA: Diagnosis not present

## 2024-02-28 DIAGNOSIS — L03116 Cellulitis of left lower limb: Secondary | ICD-10-CM | POA: Diagnosis not present

## 2024-02-28 LAB — IRON AND TIBC
Iron: 60 ug/dL (ref 45–182)
Saturation Ratios: 15 % — ABNORMAL LOW (ref 17.9–39.5)
TIBC: 407 ug/dL (ref 250–450)
UIBC: 347 ug/dL

## 2024-02-28 LAB — CBC
HCT: 33.5 % — ABNORMAL LOW (ref 39.0–52.0)
Hemoglobin: 10.4 g/dL — ABNORMAL LOW (ref 13.0–17.0)
MCH: 24.3 pg — ABNORMAL LOW (ref 26.0–34.0)
MCHC: 31 g/dL (ref 30.0–36.0)
MCV: 78.3 fL — ABNORMAL LOW (ref 80.0–100.0)
Platelets: 163 K/uL (ref 150–400)
RBC: 4.28 MIL/uL (ref 4.22–5.81)
RDW: 15.1 % (ref 11.5–15.5)
WBC: 6.3 K/uL (ref 4.0–10.5)
nRBC: 0 % (ref 0.0–0.2)

## 2024-02-28 LAB — HIV ANTIBODY (ROUTINE TESTING W REFLEX): HIV Screen 4th Generation wRfx: NONREACTIVE

## 2024-02-28 NOTE — Progress Notes (Signed)
  Progress Note   Patient: Brandon Myers FMW:[id number] DOB: 12/29/1965 DOA: 02/27/2024     1 DOS: the patient was seen and examined on 02/28/2024   Brief hospital course: [name] is a 58 y.o. 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   Patient was walking barefoot in the sun on Sunday, developed a large blister, he popped the blister Sunday night  the skin already broke into pieces and drained some thin bloody content.  Family helped him to clean open wound with iodine for last 2 days however he started to feel left foot pain and swelling since last night.  Denied any fever or chills.   ED Course: Afebrile, temperature 99.6 nontachycardic nonhypotensive not hypoxic.  Blood work showed WBC 4.5 hemoglobin 10.7 BUN 13 creatinine 0.8.   Patient was given ceftriaxone  and vancomycin  in ED.   Review of Systems: As per HPI otherwise 14 point review of systems negative.      Assessment and Plan:  :Left foot ulcer infection Left foot cellulitis Secondary to self-inflicted wound after cutting open a blister/second-degree burn?  Unable to move the foot. Patient at high risk for infection due to congenital immunodeficiency. Continue Ancef  adjusted to renal function Cam boot to offload pressure on forefoot Consult podiatry     HTN Stable  Continue metoprolol  and losartan     Peripheral neuropathy Continue gabapentin     Congenital neutropenia Outpatient routine G-CSF therapy   Chronic microcytic anemia Follow-up results of iron studies     Subjective: No new complaints.  Wife at the bedside.  Physical Exam: Vitals:   02/27/24 1515 02/27/24 2057 02/28/24 0413 02/28/24 0721  BP: (!) 127/92 (!) 140/83 113/86 124/82  Pulse: 67 67 60 62  Resp: 18 20 16 17   Temp: 98.4 F (36.9 C) 99.6 F (37.6 C) 98.9 F (37.2 C) 98.4  F (36.9 C)  TempSrc:    Oral  SpO2: 94% 94% 96% 94%  Weight:      Height:       Eyes: PERRL, lids and conjunctivae normal ENMT: Mucous membranes are moist. Posterior pharynx clear of any exudate or lesions.Normal dentition.  Neck: normal, supple, no masses, no thyromegaly Respiratory: clear to auscultation bilaterally, no wheezing, no crackles. Normal respiratory effort. No accessory muscle use.  Cardiovascular: Regular rate and rhythm, no murmurs / rubs / gallops. No extremity edema. 2+ pedal pulses. No carotid bruits.  Abdomen: no tenderness, no masses palpated. No hepatosplenomegaly. Bowel sounds positive.  Musculoskeletal: no clubbing / cyanosis. No joint deformity upper and lower extremities. Good ROM, no contractures. Normal muscle tone.  Skin: Large area of shallow ulcer of left bottom foot, macular rash surrounding with tenderness and warmth to touch forefoot Neurologic: CN 2-12 grossly intact. Sensation intact, DTR normal. Strength 5/5 in all 4.  Psychiatric: Normal judgment and insight. Alert and oriented x 3. Normal mood.          Data Reviewed: Hemoglobin 10.4, hematocrit 33.5, MCV 78.3 Labs reviewed  Family Communication: Plan of care discussed with patient and his wife at the bedside.  They verbalized understanding and agreement.  Disposition: Status is: Inpatient Remains inpatient appropriate because: On IV antibiotics.  Planned Discharge Destination: Home    Time spent: 40  minutes  Author: Aimee Somerset, MD 02/28/2024 1:08 PM  For on call review [email or web address].

## 2024-02-28 NOTE — Consult Note (Addendum)
 PODIATRY CONSULTATION  NAME JALEAL SCHLIEP MRN [id number] DOB February 17, 1966 DOA 02/27/2024   Reason for consult:  Left foot wound  Attending/Consulting physician: IVAR Somerset MD  History of present illness: Brandon Myers is a 58 y.o. 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   Patient was walking barefoot in the sun on Sunday, developed a large blister, he popped the blister Sunday night  the skin already broke into pieces and drained some thin bloody content.  Family helped him to clean open wound with iodine for last 2 days however he started to feel left foot pain and swelling since last night.  Denied any fever or chills.  Pt reports he got the wound from walking barefoot on hot surface. Says there was a blister that then ruptured and he peeled some dead skin off. Has been cleaning with iodine. He does have some numbness on the left foot but he is not sure why.  Past Medical History:  Diagnosis Date   Allergy    Anemia 03/03/2022   Anxiety 01/21/2020   Controlled substance agreement signed 04/02/2017   GERD (gastroesophageal reflux disease)    Hypertension    Neutropenia, congenital (HCC)    Phantom limb pain (HCC)        Latest Ref Rng & Units 02/28/2024    6:02 AM 02/27/2024    5:26 AM 11/21/2023   10:59 AM  CBC  WBC 4.0 - 10.5 K/uL 6.3  4.5  5.3   Hemoglobin 13.0 - 17.0 g/dL 89.5  89.2  88.7   Hematocrit 39.0 - 52.0 % 33.5  35.1  36.6   Platelets 150 - 400 K/uL 163  146  183        Latest Ref Rng & Units 02/27/2024    5:26 AM 11/21/2023   10:59 AM 05/09/2023   11:18 AM  BMP  Glucose 70 - 99 mg/dL 889  885  799   BUN 6 - 20 mg/dL 13  16  9    Creatinine 0.61 - 1.24 mg/dL 9.10  9.08  9.20   BUN/Creat Ratio 6 - 22 (calc)  SEE NOTE:  SEE NOTE:   Sodium 135 - 145 mmol/L 135  137  136   Potassium 3.5 - 5.1 mmol/L 4.3   4.6  4.2   Chloride 98 - 111 mmol/L 103  102  99   CO2 22 - 32 mmol/L 23  28  25    Calcium  8.9 - 10.3 mg/dL 9.4  9.5  9.7       Physical Exam: Lower Extremity Exam History R AKA  L foot 2+ DP and PT pulses palpable  Left foot with large circular plantar foot ulceration to subcutaneous fat tissue with healthy granular tissue bed. No evidence of deep wound or purulence.   Sensation diminished to left foot  Mild dorsal rash on left foot no significant erythema, mild edema  See pre and post cleansing/light debridement with betadine gauze         ASSESSMENT/PLAN OF CARE 58 y.o. 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   WBC 6.5 XR foot L: Soft tissue swelling with no acute osseous abnormality identified in the left foot.  -  No surgical plans, does not appear to be infected at this time, minimal cellulitis to left foot.  - Recommend wound care to include daily dressing change - wash wound with wound wash vashe or betadine and then apply xeroform contact layers, 4x4 gauze, kerlixa and secure with ace wrap. - offload with CAM boot LLE, has in room - Recommend 10 days PO abx, augmentin  is fine - Anticoagulation: continue as per primary recs - Wound care: As above, daily dressing change - WB status: WBAT to left foot in CAM boot- did recommend decreasing ambulation to minimum to help heal wound - Will  sign off please msg with questions. He is stable for DC from my standpoint when cleared by primary. Pt will follow up in 2 weeks, office to call to arrange.  Thank you for the consult.  Please contact me directly with any questions or concerns.           Marolyn JULIANNA Honour, DPM [facility]    [address] [place], [place]  [address]                Office [phone]  Fax [phone]

## 2024-02-29 DIAGNOSIS — L03116 Cellulitis of left lower limb: Secondary | ICD-10-CM | POA: Diagnosis not present

## 2024-02-29 MED ORDER — METOPROLOL SUCCINATE ER 50 MG PO TB24
50.0000 mg | ORAL_TABLET | Freq: Every day | ORAL | Status: DC
  Administered 2024-02-29: 50 mg via ORAL
  Filled 2024-02-29: qty 1

## 2024-02-29 MED ORDER — OXYCODONE HCL 5 MG PO TABS: 5.0000 mg | ORAL_TABLET | Freq: Four times a day (QID) | ORAL | 0 refills | Status: AC | PRN

## 2024-02-29 MED ORDER — CEPHALEXIN 500 MG PO CAPS: 500.0000 mg | ORAL_CAPSULE | Freq: Two times a day (BID) | ORAL | 0 refills | Status: AC

## 2024-02-29 MED ORDER — METOPROLOL SUCCINATE ER 50 MG PO TB24: ORAL_TABLET | ORAL | 0 refills | Status: DC

## 2024-02-29 NOTE — Progress Notes (Signed)
 Patient's foot dressing changed and wife taught how to change foot dressing.

## 2024-02-29 NOTE — Progress Notes (Signed)
 Patient discharging home, all belongings sent with patient. IV removed. Medication from pharmacy returned to patient. Wife at bedside to transport. All discharge education, teaching provided and questions answered.

## 2024-02-29 NOTE — Discharge Summary (Signed)
 Physician Discharge Summary   Patient: Brandon Myers MRN: [id number] DOB: March 21, 1966  Admit date:     02/27/2024  Discharge date: 02/29/24  Discharge Physician: Jaxyn Rout   PCP: Antonette Angeline ORN, NP   Recommendations at discharge:   Complete antibiotic therapy as recommended Keep scheduled follow-up appointment with podiatry wash wound with wound wash vashe or betadine and then apply xeroform contact layers, 4x4 gauze, kerlixa and secure with ace wrap.  offload with CAM boot LLE   Discharge Diagnoses: Principal Problem:   Cellulitis of left lower extremity Active Problems:   First degree burn of left foot   Congenital agranulocytosis (HCC)   Essential hypertension   Esophageal reflux   Amputation of right arm (HCC)   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(HCC)   Phantom limb pain (HCC)  Resolved Problems:   * No resolved hospital problems. *  Hospital Course:  Brandon Myers is a 58 y.o. 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   Patient was walking barefoot in the sun on "Sunday, developed a large blister, he popped the blister Sunday night  the skin already broke into pieces and drained some thin bloody content.  Family helped him to clean open wound with iodine for last 2 days however he started to feel left foot pain and swelling since last night.  Denied any fever or chills.   ED Course: Afebrile, temperature 99.6 nontachycardic nonhypotensive not hypoxic.  Blood work showed WBC 4.5 hemoglobin 10.7 BUN 13 creatinine 0.8.   Patient was given ceftriaxone and vancomycin in ED.   Review of Systems: As per HPI otherwise 14 point review of systems negative.         Assessment and Plan:   :Left foot ulcer infection Left foot cellulitis Secondary to self-inflicted wound after cutting open a blister/first-degree burn?    Patient at high risk for infection due to congenital immunodeficiency. Was treated with IV Ancef and will be discharged home to complete a 10-day course of Keflex Cam boot to offload pressure on forefoot Appreciate podiatry input Instructions for wound care given, wash wound with wound wash vashe or betadine and then apply xeroform contact layers, 4x4 gauze, kerlixa and secure with ace wrap. (Patient's wife educated on how to perform wound care at home) Patient to follow-up with podiatrist in 2 weeks       HTN Stable  Continue metoprolol and losartan Dose of metoprolol decreased to 50 daily due to relative bradycardia     Peripheral neuropathy Continue gabapentin     Congenital neutropenia Outpatient routine G-CSF therapy     Chronic microcytic anemia Follow-up results of iron studies            Consultants: Podiatry Procedures performed: None Disposition: Home Diet recommendation:  Discharge Diet Orders (From admission, onward)     Start     Ordered   02/29/24 0000  Diet - low sodium heart healthy        07" /31/25 1016           Cardiac diet DISCHARGE MEDICATION: Allergies as of 02/29/2024       Reactions   Enalapril  Cough   ACEi Cough   Hydrochlorothiazide     Sweats, hot flashes   Prochlorperazine    Other reaction(s): Other (See Comments) As a child unable to recall   Sulfa Antibiotics Other (See Comments)  Other reaction(s): Other (See Comments) As a child unable to recall   Benadryl [diphenhydramine]    Increased phantom pain   Strawberry Extract Rash        Medication List     STOP taking these medications    albuterol  108 (90 Base) MCG/ACT inhaler Commonly known as: Ventolin  HFA   NEOMYCIN -POLYMYXIN-HYDROCORTISONE 1 % Soln OTIC solution Commonly known as: CORTISPORIN   nortriptyline  25 MG capsule Commonly known as: PAMELOR        TAKE these medications    atorvastatin  10 MG tablet Commonly known as: LIPITOR TAKE 1  TABLET(10 MG) BY MOUTH DAILY   cephALEXin  500 MG capsule Commonly known as: KEFLEX  Take 1 capsule (500 mg total) by mouth 2 (two) times daily for 9 days. What changed: when to take this   filgrastim -sndz 480 MCG/0.8ML Sosy injection Commonly known as: ZARXIO  Inject 0.8 mLs into the muscle every other day.   gabapentin  600 MG tablet Commonly known as: NEURONTIN  TAKE 1 TABLET(600 MG) BY MOUTH THREE TIMES DAILY   loratadine  10 MG tablet Commonly known as: CLARITIN  Take 1 tablet (10 mg total) by mouth daily. TAKE 1 TABLET(10 MG) BY MOUTH DAILY   losartan  100 MG tablet Commonly known as: COZAAR  TAKE 1 TABLET(100 MG) BY MOUTH DAILY   metoprolol  succinate 50 MG 24 hr tablet Commonly known as: TOPROL -XL TAKE 1 TABLET BY MOUTH EVERY DAY AFTER A MEAL What changed: medication strength   omeprazole  40 MG capsule Commonly known as: PRILOSEC TAKE 1 CAPSULE(40 MG) BY MOUTH DAILY   oxyCODONE  5 MG immediate release tablet Commonly known as: Oxy IR/ROXICODONE  Take 1 tablet (5 mg total) by mouth every 6 (six) hours as needed for up to 5 days for severe pain (pain score 7-10) or breakthrough pain.   tiZANidine  4 MG tablet Commonly known as: Zanaflex  Take 1 tablet (4 mg total) by mouth at bedtime as needed for muscle spasms.               Discharge Care Instructions  (From admission, onward)           Start     Ordered   02/29/24 0000  Discharge wound care:       Comments: Cleanse with Vashe #[id number] or betadine on gauze 4x4, pat dry the peri-wounds skin. Apply Xeroform on the wound bed. Cover with 4x4 gauze, kerlix and ace wrap.   02/29/24 1016            Follow-up Information     Standiford, Marsa FALCON, DPM Follow up in 2 week(s).   Specialty: Podiatry Contact information: [address] [place] [address] [phone]                Discharge Exam: Brandon Myers   02/27/24 0405  Weight: 91.2 kg   Eyes: PERRL, lids and  conjunctivae normal ENMT: Mucous membranes are moist. Posterior pharynx clear of any exudate or lesions.Normal dentition.  Neck: normal, supple, no masses, no thyromegaly Respiratory: clear to auscultation bilaterally, no wheezing, no crackles. Normal respiratory effort. No accessory muscle use.  Cardiovascular: Regular rate and rhythm, no murmurs / rubs / gallops. No extremity edema. 2+ pedal pulses. No carotid bruits.  Abdomen: no tenderness, no masses palpated. No hepatosplenomegaly. Bowel sounds positive.  Musculoskeletal: no clubbing / cyanosis. No joint deformity upper and lower extremities. Good ROM, no contractures. Normal muscle tone.  Skin: Large area of shallow ulcer of left bottom foot, macular rash surrounding with tenderness and  warmth to touch forefoot Neurologic: CN 2-12 grossly intact. Sensation intact, DTR normal. Strength 5/5 in all 4.  Psychiatric: Normal judgment and insight. Alert and oriented x 3. Normal mood.              Condition at discharge: stable  The results of significant diagnostics from this hospitalization (including imaging, microbiology, ancillary and laboratory) are listed below for reference.   Imaging Studies: DG Foot 2 Views Left Result Date: 02/27/2024 CLINICAL DATA:  58 year old male status post burn injury to the bottom of foot from walking on hot surface. Blister and erythema. EXAM: LEFT FOOT - 2 VIEW COMPARISON:  None Available. FINDINGS: Two views. Some generalized soft tissue swelling in the left foot, most pronounced at the MTP region. No soft tissue gas identified. Bone mineralization is within normal limits. Maintained joint spaces and alignment. Mild degenerative calcaneus spurring. No acute osseous abnormality identified. IMPRESSION: Soft tissue swelling with no acute osseous abnormality identified in the left foot. Electronically Signed   By: VEAR Hurst M.D.   On: 02/27/2024 07:27    Microbiology: Results for orders placed or performed  during the hospital encounter of 02/27/24  Blood culture (single)     Status: None (Preliminary result)   Collection Time: 02/27/24  5:26 AM   Specimen: BLOOD LEFT ARM  Result Value Ref Range Status   Specimen Description BLOOD LEFT ARM  Final   Special Requests   Final    BOTTLES DRAWN AEROBIC AND ANAEROBIC Blood Culture adequate volume   Culture   Final    NO GROWTH 2 DAYS Performed at [facility], [address], [place], [place] [address]    Report Status PENDING  Incomplete  MRSA Next Gen by PCR, Nasal     Status: None   Collection Time: 02/27/24  8:25 AM   Specimen: Nasal Mucosa; Nasal Swab  Result Value Ref Range Status   MRSA by PCR Next Gen NOT DETECTED NOT DETECTED Final    Comment: (NOTE) The GeneXpert MRSA Assay (FDA approved for NASAL specimens only), is one component of a comprehensive MRSA colonization surveillance program. It is not intended to diagnose MRSA infection nor to guide or monitor treatment for MRSA infections. Test performance is not FDA approved in patients less than 90 years old. Performed at [facility], [address] [place], [place] [address]     Labs: CBC: Recent Labs  Lab 02/27/24 0526 02/28/24 0602  WBC 4.5 6.3  NEUTROABS 0.9*  --   HGB 10.7* 10.4*  HCT 35.1* 33.5*  MCV 78.9* 78.3*  PLT 146* 163   Basic Metabolic Panel: Recent Labs  Lab 02/27/24 0526  NA 135  K 4.3  CL 103  CO2 23  GLUCOSE 110*  BUN 13  CREATININE 0.89  CALCIUM  9.4   Liver Function Tests: No results for input(s): AST, ALT, ALKPHOS, BILITOT, PROT, ALBUMIN in the last 168 hours. CBG: No results for input(s): GLUCAP in the last 168 hours.  Discharge time spent: greater than 30 minutes.  Signed: Aimee Somerset, MD [facility] 02/29/2024

## 2024-02-29 NOTE — Plan of Care (Signed)

## 2024-03-03 LAB — CULTURE, BLOOD (SINGLE)
Culture: NO GROWTH
Special Requests: ADEQUATE

## 2024-03-13 ENCOUNTER — Ambulatory Visit: Admitting: Podiatry

## 2024-03-13 VITALS — Ht 68.0 in | Wt 201.0 lb

## 2024-03-13 DIAGNOSIS — T148XXA Other injury of unspecified body region, initial encounter: Secondary | ICD-10-CM

## 2024-03-13 DIAGNOSIS — L97521 Non-pressure chronic ulcer of other part of left foot limited to breakdown of skin: Secondary | ICD-10-CM | POA: Diagnosis not present

## 2024-03-13 MED ORDER — OXYCODONE HCL 5 MG PO TABS
5.0000 mg | ORAL_TABLET | Freq: Two times a day (BID) | ORAL | 0 refills | Status: AC | PRN
Start: 2024-03-13 — End: 2024-03-18

## 2024-03-13 NOTE — Progress Notes (Signed)
  Subjective:  Patient ID: Brandon Myers, male    DOB: [birth year],  MRN: [id number]  Chief Complaint  Patient presents with   Wound Check    Rm 2 Patient is here for follow-up on left foot wound. Wound is primarily closed with minimum drainage. There is a possible hematoma on the bottom of the third left toe.    58 y.o. male presents with the above complaint. History confirmed with patient.   Objective:  Physical Exam: warm, good capillary refill, normal DP and PT pulses, and ulceration at submetatarsal 2/3 area and blood blister on third toe.      1. Ulcer of left foot, limited to breakdown of skin (HCC)   2. Blood blister      Plan:  Patient was evaluated and treated and all questions answered.  Greatly improved since inpatient admission.  No active signs of infection and appears to be healing well.  Has a new blood blister on the third toe.  May discontinue major dressings and apply Neosporin and Band-Aid to the ulcer site.  Bathe normally.  Full weightbearing as tolerated in surgical shoe on the left foot.  Return in 3 weeks to reevaluate. Return in about 3 weeks (around 04/03/2024) for wound care.

## 2024-03-26 ENCOUNTER — Telehealth: Payer: Self-pay | Admitting: Podiatry

## 2024-03-26 NOTE — Telephone Encounter (Unsigned)
 Copied from CRM #[id number]. Topic: Clinical - Medication Refill >> Mar 26, 2024 10:54 AM Debby BROCKS wrote: Medication:  oxyCODONE  (OXY IR/ROXICODONE ) 5 MG immediate release tablet  Has the patient contacted their pharmacy? No (Agent: If no, request that the patient contact the pharmacy for the refill. If patient does not wish to contact the pharmacy document the reason why and proceed with request.) (Agent: If yes, when and what did the pharmacy advise?) Needs to Contact Provider  This is the patient's preferred pharmacy:  [id number] - [place], [place] - [address] AT [facility] OF [address] [place] [address] [place] [address] Phone: [phone] Fax: [phone]  Is this the correct pharmacy for this prescription? Yes If no, delete pharmacy and type the correct one.   Has the prescription been filled recently? Yes, roughly 2 weeks ago  Is the patient out of the medication? Yes  Has the patient been seen for an appointment in the last year OR does the patient have an upcoming appointment? Yes  Can we respond through MyChart? Yes  Agent: Please be advised that Rx refills may take up to 3 business days. We ask that you follow-up with your pharmacy.

## 2024-03-28 ENCOUNTER — Encounter: Payer: Self-pay | Admitting: Internal Medicine

## 2024-04-03 ENCOUNTER — Ambulatory Visit: Admitting: Podiatry

## 2024-04-04 ENCOUNTER — Other Ambulatory Visit: Payer: Self-pay | Admitting: Internal Medicine

## 2024-04-11 ENCOUNTER — Encounter: Payer: Self-pay | Admitting: Internal Medicine

## 2024-04-11 ENCOUNTER — Ambulatory Visit (INDEPENDENT_AMBULATORY_CARE_PROVIDER_SITE_OTHER): Admitting: Internal Medicine

## 2024-04-11 VITALS — BP 132/86 | Ht 68.0 in | Wt 212.6 lb

## 2024-04-11 DIAGNOSIS — Z23 Encounter for immunization: Secondary | ICD-10-CM | POA: Diagnosis not present

## 2024-04-11 DIAGNOSIS — L03116 Cellulitis of left lower limb: Secondary | ICD-10-CM

## 2024-04-11 DIAGNOSIS — G5792 Unspecified mononeuropathy of left lower limb: Secondary | ICD-10-CM | POA: Diagnosis not present

## 2024-04-11 DIAGNOSIS — T25122D Burn of first degree of left foot, subsequent encounter: Secondary | ICD-10-CM

## 2024-04-11 MED ORDER — GABAPENTIN 600 MG PO TABS: 600.0000 mg | ORAL_TABLET | Freq: Three times a day (TID) | ORAL | 0 refills | Status: DC

## 2024-04-11 NOTE — Patient Instructions (Signed)
 Cellulitis, Adult    Cellulitis is a skin infection. The infected area is often warm, red, swollen, and sore. It occurs most often on the legs, feet, and toes, but can happen on any part of the body.  This condition can be life-threatening without treatment. It is very important to get treated right away.  What are the causes?  This condition is caused by bacteria. The bacteria enter through a break in the skin, such as:  A cut.  A burn.  A bug bite.  An animal bite.  An open sore.  A crack.  What increases the risk?  Having a weak body's defense system (immune system).  Being older than 56 years old.  Having a blood sugar problem (diabetes).  Having a long-term liver disease (cirrhosis) or kidney disease.  Being very overweight (obese).  Having a skin problem, such as:  An itchy rash.  A rash caused by a fungus.  A rash with blisters.  Slow movement of blood in the veins (venous stasis).  Fluid buildup below the skin (edema).  This condition is more likely to occur in people who:  Have open cuts, burns, bites, or scrapes on the skin.  Have been treated with high-energy rays (radiation).  Use IV drugs.  What are the signs or symptoms?  Skin that:  Looks red or purple, or slightly darker than your usual skin color.  Has streaks.  Has spots.  Is swollen.  Is sore or painful when you touch it.  Is warm.  A fever.  Chills.  Blisters.  Tiredness (fatigue).  How is this treated?  Medicines to treat infections or allergies.  Rest.  Placing cold or warm cloths on the skin.  Staying in the hospital, if the condition is very bad. You may need medicines through an IV.  Follow these instructions at home:  Medicines  Take over-the-counter and prescription medicines only as told by your doctor.  If you were prescribed antibiotics, take them as told by your doctor. Do not stop using them even if you start to feel better.  General instructions  Drink enough fluid to keep your pee (urine) pale yellow.  Do not touch or rub the  infected area.  Raise (elevate) the infected area above the level of your heart while you are sitting or lying down.  Return to your normal activities when your doctor says that it is safe.  Place cold or warm cloths on the area as told by your doctor.  Keep all follow-up visits. Your doctor will need to make sure that a more serious infection is not developing.  Contact a doctor if:  You have a fever.  You do not start to get better after 1-2 days of treatment.  Your bone or joint under the infected area starts to hurt after the skin has healed.  Your infection comes back in the same area or another area. Signs of this may include:  You have a swollen bump in the area.  Your red area gets larger, turns dark in color, or hurts more.  You have more fluid coming from the wound.  Pus or a bad smell develops in your infected area.  You have more pain.  You feel sick and have muscle aches and weakness.  You develop vomiting or watery poop that will not go away.  Get help right away if:  You see red streaks coming from the area.  You notice the skin turns purple or black and falls  off.  These symptoms may be an emergency. Get help right away. Call 911.  Do not wait to see if the symptoms will go away.  Do not drive yourself to the hospital.  This information is not intended to replace advice given to you by your health care provider. Make sure you discuss any questions you have with your health care provider.  Document Revised: 03/15/2022 Document Reviewed: 03/15/2022  Elsevier Patient Education  2024 [employer].
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
## 2024-04-20 ENCOUNTER — Other Ambulatory Visit: Payer: Self-pay | Admitting: Internal Medicine

## 2024-04-20 DIAGNOSIS — K219 Gastro-esophageal reflux disease without esophagitis: Secondary | ICD-10-CM

## 2024-04-22 NOTE — Telephone Encounter (Signed)
 Requested Prescriptions  Pending Prescriptions Disp Refills   omeprazole  (PRILOSEC) 40 MG capsule [Pharmacy Med Name: OMEPRAZOLE  40MG  CAPSULES] 90 capsule 0    Sig: TAKE 1 CAPSULE(40 MG) BY MOUTH DAILY     Gastroenterology: Proton Pump Inhibitors Passed - 04/22/2024  2:19 PM      Passed - Valid encounter within last 12 months    Recent Outpatient Visits           1 week ago Cellulitis of left lower extremity   [place] [facility] [place], Angeline ORN, NP   5 months ago Encounter for general adult medical examination with abnormal findings   [place] [facility] [place], Angeline ORN, NP   5 months ago Paronychia of great toe of left foot   [place] [facility] [place], Angeline ORN, [place]

## 2024-04-30 ENCOUNTER — Other Ambulatory Visit: Payer: Self-pay | Admitting: Internal Medicine

## 2024-04-30 DIAGNOSIS — I10 Essential (primary) hypertension: Secondary | ICD-10-CM

## 2024-05-01 ENCOUNTER — Encounter: Payer: Self-pay | Admitting: Internal Medicine

## 2024-05-01 DIAGNOSIS — S88919A Complete traumatic amputation of unspecified lower leg, level unspecified, initial encounter: Secondary | ICD-10-CM

## 2024-05-02 NOTE — Telephone Encounter (Signed)
 Can you please call the [facility] and ask them what specifically the order needs to state if they can not fax me an order form.

## 2024-05-02 NOTE — Telephone Encounter (Signed)
 Requested Prescriptions  Pending Prescriptions Disp Refills   losartan  (COZAAR ) 100 MG tablet [Pharmacy Med Name: LOSARTAN  100MG  TABLETS] 90 tablet 0    Sig: TAKE 1 TABLET(100 MG) BY MOUTH DAILY     Cardiovascular:  Angiotensin Receptor Blockers Passed - 05/02/2024  1:07 PM      Passed - Cr in normal range and within 180 days    Creat  Date Value Ref Range Status  11/21/2023 0.91 0.70 - 1.30 mg/dL Final   Creatinine, Ser  Date Value Ref Range Status  02/27/2024 0.89 0.61 - 1.24 mg/dL Final         Passed - K in normal range and within 180 days    Potassium  Date Value Ref Range Status  02/27/2024 4.3 3.5 - 5.1 mmol/L Final         Passed - Patient is not pregnant      Passed - Last BP in normal range    BP Readings from Last 1 Encounters:  04/11/24 132/86         Passed - Valid encounter within last 6 months    Recent Outpatient Visits           3 weeks ago Cellulitis of left lower extremity   [place] [facility] [place], Angeline ORN, NP   5 months ago Encounter for general adult medical examination with abnormal findings   [place] [facility] [place], Angeline ORN, NP   6 months ago Paronychia of great toe of left foot   [place] [facility] [place], Angeline ORN, [place]

## 2024-05-14 ENCOUNTER — Other Ambulatory Visit: Payer: Self-pay | Admitting: Internal Medicine

## 2024-05-15 ENCOUNTER — Ambulatory Visit: Admitting: Family

## 2024-05-16 ENCOUNTER — Telehealth (INDEPENDENT_AMBULATORY_CARE_PROVIDER_SITE_OTHER): Admitting: Internal Medicine

## 2024-05-16 ENCOUNTER — Encounter: Payer: Self-pay | Admitting: Internal Medicine

## 2024-05-16 DIAGNOSIS — J019 Acute sinusitis, unspecified: Secondary | ICD-10-CM

## 2024-05-16 DIAGNOSIS — B9689 Other specified bacterial agents as the cause of diseases classified elsewhere: Secondary | ICD-10-CM | POA: Diagnosis not present

## 2024-05-16 MED ORDER — AMOXICILLIN-POT CLAVULANATE 875-125 MG PO TABS: 1.0000 | ORAL_TABLET | Freq: Two times a day (BID) | ORAL | 0 refills | Status: DC

## 2024-05-16 MED ORDER — ALBUTEROL SULFATE HFA 108 (90 BASE) MCG/ACT IN AERS: 2.0000 | INHALATION_SPRAY | Freq: Four times a day (QID) | RESPIRATORY_TRACT | 0 refills | Status: AC | PRN

## 2024-05-16 NOTE — Patient Instructions (Signed)

## 2024-05-16 NOTE — Telephone Encounter (Signed)
 Requested Prescriptions  Pending Prescriptions Disp Refills   atorvastatin  (LIPITOR) 10 MG tablet [Pharmacy Med Name: ATORVASTATIN  10MG  TABLETS] 90 tablet 1    Sig: TAKE 1 TABLET(10 MG) BY MOUTH DAILY     Cardiovascular:  Antilipid - Statins Failed - 05/16/2024 11:24 AM      Failed - Lipid Panel in normal range within the last 12 months    Cholesterol, Total  Date Value Ref Range Status  12/23/2019 139 100 - 199 mg/dL Final   Cholesterol  Date Value Ref Range Status  11/21/2023 91 <200 mg/dL Final   LDL Cholesterol (Calc)  Date Value Ref Range Status  11/21/2023 49 mg/dL (calc) Final    Comment:    Reference range: <100 . Desirable range <100 mg/dL for primary prevention;   <70 mg/dL for patients with CHD or diabetic patients  with > or = 2 CHD risk factors. SABRA LDL-C is now calculated using the Martin-Hopkins  calculation, which is a validated novel method providing  better accuracy than the Friedewald equation in the  estimation of LDL-C.  Gladis APPLETHWAITE et al. SANDREA. 7986;689(80): 2061-2068  (http://education.[facility].com/faq/FAQ164)    HDL  Date Value Ref Range Status  11/21/2023 18 (L) > OR = 40 mg/dL Final  94/75/7978 23 (L) >39 mg/dL Final   Triglycerides  Date Value Ref Range Status  11/21/2023 154 (H) <150 mg/dL Final         Passed - Patient is not pregnant      Passed - Valid encounter within last 12 months    Recent Outpatient Visits           1 month ago Cellulitis of left lower extremity   [place] [facility] [place], Angeline ORN, NP   5 months ago Encounter for general adult medical examination with abnormal findings   [place] [facility] [place], Angeline ORN, NP   6 months ago Paronychia of great toe of left foot   [place] [facility] [place], Angeline ORN, [place]

## 2024-05-16 NOTE — Progress Notes (Signed)
 Virtual Visit via Video Note  I connected with [name] on 05/16/24 at  1:20 PM EDT by a video enabled telemedicine application and verified that I am speaking with the correct person using two identifiers.  Location: Patient: Home Provider: Office  Person's participating in this video call: Angeline Laura, NP-C and [place] Souter   I discussed the limitations of evaluation and management by telemedicine and the availability of in person appointments. The patient expressed understanding and agreed to proceed.  History of Present Illness:  Discussed the use of AI scribe software for clinical note transcription with the patient, who gave verbal consent to proceed.  LLIAM Myers is a 58 year old male who presents with upper respiratory symptoms.  He has been experiencing upper respiratory symptoms, including headache, nasal congestion, scratchy throat, cough and shortness of breath. No ear pain, sore throat. He typically experiences seasonal allergies in the fall, but he feels that his current symptoms are worse than his usual allergies.  He has chills and body aches. No nausea, vomiting, diarrhea. He has taken tylenol  and sudafed for his symptoms.  He has a history of sinus infections. He has had sick contacts. He has not taken a covid or flu test.  Past Medical History:  Diagnosis Date   Allergy    Anemia 03/03/2022   Anxiety 01/21/2020   Controlled substance agreement signed 04/02/2017   GERD (gastroesophageal reflux disease)    Hypertension    Neutropenia, congenital (HCC)    Phantom limb pain (HCC)     Current Outpatient Medications  Medication Sig Dispense Refill   atorvastatin  (LIPITOR) 10 MG tablet TAKE 1 TABLET(10 MG) BY MOUTH DAILY 90 tablet 1   filgrastim -sndz (ZARXIO ) 480 MCG/0.8ML SOSY injection Inject 0.8 mLs into the muscle every other day.     gabapentin  (NEURONTIN ) 600 MG tablet Take 1 tablet (600 mg total) by mouth 3 (three) times daily. 270 tablet 0   loratadine   (CLARITIN ) 10 MG tablet Take 1 tablet (10 mg total) by mouth daily. TAKE 1 TABLET(10 MG) BY MOUTH DAILY 90 tablet 3   losartan  (COZAAR ) 100 MG tablet TAKE 1 TABLET(100 MG) BY MOUTH DAILY 90 tablet 0   metoprolol  succinate (TOPROL -XL) 50 MG 24 hr tablet TAKE 1 TABLET BY MOUTH EVERY DAY AFTER A MEAL 30 tablet 0   omeprazole  (PRILOSEC) 40 MG capsule TAKE 1 CAPSULE(40 MG) BY MOUTH DAILY 90 capsule 0   No current facility-administered medications for this visit.    Allergies  Allergen Reactions   Enalapril  Cough    ACEi Cough   Hydrochlorothiazide      Sweats, hot flashes   Prochlorperazine     Other reaction(s): Other (See Comments) As a child unable to recall   Sulfa Antibiotics Other (See Comments)    Other reaction(s): Other (See Comments) As a child unable to recall   Benadryl [Diphenhydramine]     Increased phantom pain   Strawberry Extract Rash    Family History  Problem Relation Age of Onset   Heart Problems Mother    Asthma Mother    Depression Mother    Thyroid  disease Mother    Alzheimer's disease Mother    Hypertension Mother    Stroke Maternal Grandmother    Cancer Maternal Grandmother    Cancer Father        skin   Hypertension Father    Cancer Paternal Grandmother    Cancer Paternal Grandfather     Social History   Socioeconomic History  Marital status: Married    Spouse name: [name]   Number of children: 2   Years of education: Not on file   Highest education level: Associate degree: occupational, Scientist, product/process development, or vocational program  Occupational History   Occupation: Disabled  Tobacco Use   Smoking status: Never   Smokeless tobacco: Never  Vaping Use   Vaping status: Never Used  Substance and Sexual Activity   Alcohol use: Never   Drug use: Never   Sexual activity: Not Currently  Other Topics Concern   Not on file  Social History Narrative   Not on file   Social Drivers of Health   Financial Resource Strain: Low Risk  (04/10/2024)    Overall Financial Resource Strain (CARDIA)    Difficulty of Paying Living Expenses: Not very hard  Food Insecurity: Food Insecurity Present (04/10/2024)   Hunger Vital Sign    Worried About Running Out of Food in the Last Year: Sometimes true    Ran Out of Food in the Last Year: Sometimes true  Transportation Needs: No Transportation Needs (04/10/2024)   PRAPARE - Administrator, Civil Service (Medical): No    Lack of Transportation (Non-Medical): No  Physical Activity: Inactive (04/10/2024)   Exercise Vital Sign    Days of Exercise per Week: 0 days    Minutes of Exercise per Session: Not on file  Stress: No Stress Concern Present (04/10/2024)   [employer] of Occupational Health - Occupational Stress Questionnaire    Feeling of Stress: Only a little  Social Connections: Moderately Isolated (04/10/2024)   Social Connection and Isolation Panel    Frequency of Communication with Friends and Family: Once a week    Frequency of Social Gatherings with Friends and Family: Once a week    Attends Religious Services: More than 4 times per year    Active Member of [employer] or Organizations: No    Attends Banker Meetings: Not on file    Marital Status: Married  Catering manager Violence: Not At Risk (02/27/2024)   Humiliation, Afraid, Rape, and Kick questionnaire    Fear of Current or Ex-Partner: No    Emotionally Abused: No    Physically Abused: No    Sexually Abused: No     Constitutional: Pt reports headache, chills. Denies fever, malaise, fatigue, or abrupt weight changes.  HEENT: Pt reports sinus pressure, nasal congestion and scratchy throat. Denies eye pain, eye redness, ear pain, ringing in the ears, wax buildup, runny nose, nasal congestion, bloody nose, or sore throat. Respiratory: Pt reports cough, shortness of breath. Denies difficulty breathing.   Cardiovascular: Denies chest pain, chest tightness, palpitations or swelling in the hands or feet.   Gastrointestinal: Denies abdominal pain, bloating, constipation, diarrhea or blood in the stool.  Musculoskeletal: Pt reports body aches. Denies decrease in range of motion, difficulty with gait, muscle pain or joint swelling.  Neurological: Denies dizziness, difficulty with memory, difficulty with speech or problems with balance and coordination.    No other specific complaints in a complete review of systems (except as listed in HPI above).  Observations/Objective:   Wt Readings from Last 3 Encounters:  04/11/24 212 lb 9.6 oz (96.4 kg)  03/13/24 201 lb (91.2 kg)  02/27/24 201 lb (91.2 kg)    General: Appears his stated age, appears unwell, but in NAD. HEENT: Head: normal shape and size, reports maxillary sinus pressure;  Nose: congestion noted; Throat/Mouth: hoarseness noted Pulmonary/Chest: Normal effort. No respiratory distress. Neurological: Alert and  oriented.   BMET    Component Value Date/Time   NA 135 02/27/2024 0526   NA 139 12/23/2019 1447   K 4.3 02/27/2024 0526   CL 103 02/27/2024 0526   CO2 23 02/27/2024 0526   GLUCOSE 110 (H) 02/27/2024 0526   BUN 13 02/27/2024 0526   BUN 13 12/23/2019 1447   CREATININE 0.89 02/27/2024 0526   CREATININE 0.91 11/21/2023 1059   CALCIUM  9.4 02/27/2024 0526   GFRNONAA >60 02/27/2024 0526   GFRNONAA >89 12/09/2016 0934   GFRAA 116 12/23/2019 1447   GFRAA >89 12/09/2016 0934    Lipid Panel     Component Value Date/Time   CHOL 91 11/21/2023 1059   CHOL 139 12/23/2019 1447   TRIG 154 (H) 11/21/2023 1059   HDL 18 (L) 11/21/2023 1059   HDL 23 (L) 12/23/2019 1447   CHOLHDL 5.1 (H) 11/21/2023 1059   VLDL 29 12/09/2016 0934   LDLCALC 49 11/21/2023 1059    CBC    Component Value Date/Time   WBC 6.3 02/28/2024 0602   RBC 4.28 02/28/2024 0602   HGB 10.4 (L) 02/28/2024 0602   HGB 14.1 11/27/2018 1344   HCT 33.5 (L) 02/28/2024 0602   HCT 41.8 11/27/2018 1344   PLT 163 02/28/2024 0602   PLT 200 11/27/2018 1344   MCV 78.3  (L) 02/28/2024 0602   MCV 83 11/27/2018 1344   MCH 24.3 (L) 02/28/2024 0602   MCHC 31.0 02/28/2024 0602   RDW 15.1 02/28/2024 0602   RDW 13.1 11/27/2018 1344   LYMPHSABS 2.0 02/27/2024 0526   LYMPHSABS 1.9 11/27/2018 1344   MONOABS 1.2 (H) 02/27/2024 0526   EOSABS 0.3 02/27/2024 0526   EOSABS 0.5 (H) 11/27/2018 1344   BASOSABS 0.1 02/27/2024 0526   BASOSABS 0.2 11/27/2018 1344    Hgb A1C Lab Results  Component Value Date   HGBA1C 6.1 (H) 11/21/2023       Assessment and Plan:  Acute Bacterial Sinusitis No recent COVID-19 or influenza exposure. Exceeds typical seasonal allergies. - Prescribed Augmentin  875 mg/125 mg BID for 10 days. - Recommended Flonase  nasal spray BID for 3 days, then QD. - Prescribed albuterol  inhaler. - Advised rest. - Instructed to report back by Monday if no improvement.  RTC in 1 week for follow-up of chronic conditions Follow Up Instructions:    I discussed the assessment and treatment plan with the patient. The patient was provided an opportunity to ask questions and all were answered. The patient agreed with the plan and demonstrated an understanding of the instructions.   The patient was advised to call back or seek an in-person evaluation if the symptoms worsen or if the condition fails to improve as anticipated.   Angeline Laura, NP

## 2024-05-22 ENCOUNTER — Ambulatory Visit: Admitting: Internal Medicine

## 2024-05-27 ENCOUNTER — Encounter: Payer: Self-pay | Admitting: Internal Medicine

## 2024-05-27 ENCOUNTER — Ambulatory Visit (INDEPENDENT_AMBULATORY_CARE_PROVIDER_SITE_OTHER): Admitting: Internal Medicine

## 2024-05-27 VITALS — BP 160/100 | HR 59 | Ht 68.0 in | Wt 210.2 lb

## 2024-05-27 DIAGNOSIS — K219 Gastro-esophageal reflux disease without esophagitis: Secondary | ICD-10-CM

## 2024-05-27 DIAGNOSIS — E781 Pure hyperglyceridemia: Secondary | ICD-10-CM

## 2024-05-27 DIAGNOSIS — E66811 Obesity, class 1: Secondary | ICD-10-CM

## 2024-05-27 DIAGNOSIS — S48911S Complete traumatic amputation of right shoulder and upper arm, level unspecified, sequela: Secondary | ICD-10-CM

## 2024-05-27 DIAGNOSIS — G546 Phantom limb syndrome with pain: Secondary | ICD-10-CM

## 2024-05-27 DIAGNOSIS — I10 Essential (primary) hypertension: Secondary | ICD-10-CM

## 2024-05-27 DIAGNOSIS — Z6831 Body mass index (BMI) 31.0-31.9, adult: Secondary | ICD-10-CM

## 2024-05-27 DIAGNOSIS — K5903 Drug induced constipation: Secondary | ICD-10-CM

## 2024-05-27 DIAGNOSIS — R7303 Prediabetes: Secondary | ICD-10-CM

## 2024-05-27 DIAGNOSIS — G894 Chronic pain syndrome: Secondary | ICD-10-CM

## 2024-05-27 DIAGNOSIS — E6609 Other obesity due to excess calories: Secondary | ICD-10-CM

## 2024-05-27 DIAGNOSIS — F419 Anxiety disorder, unspecified: Secondary | ICD-10-CM

## 2024-05-27 DIAGNOSIS — S88919A Complete traumatic amputation of unspecified lower leg, level unspecified, initial encounter: Secondary | ICD-10-CM

## 2024-05-27 DIAGNOSIS — D700 Congenital agranulocytosis: Secondary | ICD-10-CM

## 2024-05-27 MED ORDER — METOPROLOL SUCCINATE ER 50 MG PO TB24: ORAL_TABLET | ORAL | 1 refills | Status: AC

## 2024-05-27 NOTE — Assessment & Plan Note (Signed)
 Continue gabapentin  600 mg 3 times daily as prescribed

## 2024-05-27 NOTE — Assessment & Plan Note (Signed)
Wearing prosthesis

## 2024-05-27 NOTE — Assessment & Plan Note (Signed)
Does not have prosthesis

## 2024-05-27 NOTE — Assessment & Plan Note (Signed)
 Complicated by obesity Encourage weight loss as this can help reduce reflux symptoms Continue omeprazole  40 mg daily, he has failed wean in the past

## 2024-05-27 NOTE — Assessment & Plan Note (Signed)
 Improved now that he is no longer on narcotics Encouraged high fiber diet and adequate water intake

## 2024-05-27 NOTE — Assessment & Plan Note (Signed)
 Not medicated Will monitor

## 2024-05-27 NOTE — Progress Notes (Signed)
 Subjective:    Patient ID: Brandon Myers, male    DOB: 07/11/1966, 58 y.o.   MRN: [id number]  HPI  Patient presents to clinic today for 74-month follow-up of chronic conditions.  HTN: His BP today is 185/96.  He is taking losartan  and metoprolol  as prescribed.  ECG from 05/2023 reviewed.  HLD: His last LDL was 49, triglycerides 845, 10/2023.  He denies myalgias on atorvastatin .  He does not consume a low-fat diet.  GERD with history of esophageal stricture: He has intermittent dysphagia. He denies breakthrough on omeprazole . He reports he failed weaning this in the past. There is no upper GI on file.  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  He is taking gabapentin  as prescribed.  He no longer follows with pain management.  Anxiety: Currently not an issue.  He is not taking any medication for this.  He is not seeing a therapist.  He denies depression, SI/HI.  Constipation: Intermittent, however he is not currently taking any medication for this.  He reports this is not as bad now that he is not on narcotics.  Colonoscopy from 11/2022 reviewed.  Prediabetes: His last A1c was 6.1%, 10/2023.  He is not taking any oral diabetic medication at this time.  He does not check his sugars.  Anemia: His last H/H was 10.4/33.5, 10/2023.  He is not taking any oral iron at this time.  He does not follow with hematology.  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  He is taking filgrastim  injections.  He follows with hematology.  Review of Systems     Past Medical History:  Diagnosis Date   Allergy    Anemia 03/03/2022   Anxiety 01/21/2020   Controlled substance agreement signed 04/02/2017   GERD (gastroesophageal reflux disease)    Hypertension    Neutropenia, congenital (HCC)    Phantom limb pain (HCC)     Current Outpatient Medications  Medication Sig Dispense Refill   albuterol  (VENTOLIN  HFA) 108 (90 Base) MCG/ACT inhaler Inhale 2  puffs into the lungs every 6 (six) hours as needed for wheezing or shortness of breath. 8 g 0   amoxicillin -clavulanate (AUGMENTIN ) 875-125 MG tablet Take 1 tablet by mouth 2 (two) times daily. 20 tablet 0   atorvastatin  (LIPITOR) 10 MG tablet TAKE 1 TABLET(10 MG) BY MOUTH DAILY 90 tablet 1   filgrastim -sndz (ZARXIO ) 480 MCG/0.8ML SOSY injection Inject 0.8 mLs into the muscle every other day.     gabapentin  (NEURONTIN ) 600 MG tablet Take 1 tablet (600 mg total) by mouth 3 (three) times daily. 270 tablet 0   loratadine  (CLARITIN ) 10 MG tablet Take 1 tablet (10 mg total) by mouth daily. TAKE 1 TABLET(10 MG) BY MOUTH DAILY 90 tablet 3   losartan  (COZAAR ) 100 MG tablet TAKE 1 TABLET(100 MG) BY MOUTH DAILY 90 tablet 0   metoprolol  succinate (TOPROL -XL) 50 MG 24 hr tablet TAKE 1 TABLET BY MOUTH EVERY DAY AFTER A MEAL 30 tablet 0   omeprazole  (PRILOSEC) 40 MG capsule TAKE 1 CAPSULE(40 MG) BY MOUTH DAILY 90 capsule 0   No current facility-administered medications for this visit.    Allergies  Allergen Reactions   Enalapril  Cough    ACEi Cough   Hydrochlorothiazide      Sweats, hot flashes   Prochlorperazine     Other reaction(s): Other (See Comments) As a child unable to recall   Sulfa Antibiotics Other (See Comments)    Other  reaction(s): Other (See Comments) As a child unable to recall   Benadryl [Diphenhydramine]     Increased phantom pain   Strawberry Extract Rash    Family History  Problem Relation Age of Onset   Heart Problems Mother    Asthma Mother    Depression Mother    Thyroid  disease Mother    Alzheimer's disease Mother    Hypertension Mother    Stroke Maternal Grandmother    Cancer Maternal Grandmother    Cancer Father        skin   Hypertension Father    Cancer Paternal Grandmother    Cancer Paternal Grandfather     Social History   Socioeconomic History   Marital status: Married    Spouse name: Akeel Reffner   Number of children: 2   Years of education: Not on  file   Highest education level: Associate degree: occupational, scientist, product/process development, or vocational program  Occupational History   Occupation: Disabled  Tobacco Use   Smoking status: Never   Smokeless tobacco: Never  Vaping Use   Vaping status: Never Used  Substance and Sexual Activity   Alcohol use: Never   Drug use: Never   Sexual activity: Not Currently  Other Topics Concern   Not on file  Social History Narrative   Not on file   Social Drivers of Health   Financial Resource Strain: Low Risk  (04/10/2024)   Overall Financial Resource Strain (CARDIA)    Difficulty of Paying Living Expenses: Not very hard  Food Insecurity: Food Insecurity Present (04/10/2024)   Hunger Vital Sign    Worried About Running Out of Food in the Last Year: Sometimes true    Ran Out of Food in the Last Year: Sometimes true  Transportation Needs: No Transportation Needs (04/10/2024)   PRAPARE - Administrator, Civil Service (Medical): No    Lack of Transportation (Non-Medical): No  Physical Activity: Inactive (04/10/2024)   Exercise Vital Sign    Days of Exercise per Week: 0 days    Minutes of Exercise per Session: Not on file  Stress: No Stress Concern Present (04/10/2024)   [employer] of Occupational Health - Occupational Stress Questionnaire    Feeling of Stress: Only a little  Social Connections: Moderately Isolated (04/10/2024)   Social Connection and Isolation Panel    Frequency of Communication with Friends and Family: Once a week    Frequency of Social Gatherings with Friends and Family: Once a week    Attends Religious Services: More than 4 times per year    Active Member of [employer] or Organizations: No    Attends Engineer, Structural: Not on file    Marital Status: Married  Catering Manager Violence: Not At Risk (02/27/2024)   Humiliation, Afraid, Rape, and Kick questionnaire    Fear of Current or Ex-Partner: No    Emotionally Abused: No    Physically Abused: No     Sexually Abused: No     Constitutional: Denies fever, malaise, fatigue, headache or abrupt weight changes.  HEENT: Denies eye pain, eye redness, ear pain, ringing in the ears, wax buildup, runny nose, nasal congestion, bloody nose, or sore throat. Respiratory: Denies difficulty breathing, shortness of breath, cough or sputum production.   Cardiovascular: Denies chest pain, chest tightness, palpitations or swelling in the hands or feet.  Gastrointestinal: Patient reports intermittent constipation.  Denies abdominal pain, bloating, diarrhea or blood in the stool.  GU: Denies urgency, frequency, pain with urination, burning  sensation, blood in urine, odor or discharge. Musculoskeletal: Patient reports chronic joint pain.  Denies decrease in range of motion, difficulty with gait, muscle pain or joint swelling.  Skin: Denies redness, rashes, lesions or ulcercations.  Neurological: Patient reports neuropathic pain.  Denies dizziness, difficulty with memory, difficulty with speech or problems with balance and coordination.  Psych: Patient has a history of anxiety.  Denies depression, SI/HI.  No other specific complaints in a complete review of systems (except as listed in HPI above).  Objective:   Physical Exam  BP (!) 160/100   Pulse (!) 59   Ht 5' 8 (1.727 m)   Wt 210 lb 3.2 oz (95.3 kg)   SpO2 96%   BMI 31.96 kg/m    Wt Readings from Last 3 Encounters:  04/11/24 212 lb 9.6 oz (96.4 kg)  03/13/24 201 lb (91.2 kg)  02/27/24 201 lb (91.2 kg)    General: Appears his stated age, obese, in NAD. Skin: Warm and clammy. HEENT: Head: normal shape and size; Eyes: sclera white, no icterus, conjunctiva pink, PERRLA and EOMs intact;  Cardiovascular: Tachycardic with rhythm. S1,S2 noted.  No murmur, rubs or gallops noted. No JVD or LLE edema. No carotid bruits noted. Pulmonary/Chest: Normal effort and positive vesicular breath sounds. No respiratory distress. No wheezes, rales or ronchi noted.   Abdomen: Soft and nontender. Normal bowel sounds.  Musculoskeletal: Gait slow and steady with use of RLE prosthesis without assistive device. Neurological: Alert and oriented. Coordination normal.  Psychiatric: Mood and affect normal. Behavior is normal. Judgment and thought content normal.    BMET    Component Value Date/Time   NA 135 02/27/2024 0526   NA 139 12/23/2019 1447   K 4.3 02/27/2024 0526   CL 103 02/27/2024 0526   CO2 23 02/27/2024 0526   GLUCOSE 110 (H) 02/27/2024 0526   BUN 13 02/27/2024 0526   BUN 13 12/23/2019 1447   CREATININE 0.89 02/27/2024 0526   CREATININE 0.91 11/21/2023 1059   CALCIUM  9.4 02/27/2024 0526   GFRNONAA >60 02/27/2024 0526   GFRNONAA >89 12/09/2016 0934   GFRAA 116 12/23/2019 1447   GFRAA >89 12/09/2016 0934    Lipid Panel     Component Value Date/Time   CHOL 91 11/21/2023 1059   CHOL 139 12/23/2019 1447   TRIG 154 (H) 11/21/2023 1059   HDL 18 (L) 11/21/2023 1059   HDL 23 (L) 12/23/2019 1447   CHOLHDL 5.1 (H) 11/21/2023 1059   VLDL 29 12/09/2016 0934   LDLCALC 49 11/21/2023 1059    CBC    Component Value Date/Time   WBC 6.3 02/28/2024 0602   RBC 4.28 02/28/2024 0602   HGB 10.4 (L) 02/28/2024 0602   HGB 14.1 11/27/2018 1344   HCT 33.5 (L) 02/28/2024 0602   HCT 41.8 11/27/2018 1344   PLT 163 02/28/2024 0602   PLT 200 11/27/2018 1344   MCV 78.3 (L) 02/28/2024 0602   MCV 83 11/27/2018 1344   MCH 24.3 (L) 02/28/2024 0602   MCHC 31.0 02/28/2024 0602   RDW 15.1 02/28/2024 0602   RDW 13.1 11/27/2018 1344   LYMPHSABS 2.0 02/27/2024 0526   LYMPHSABS 1.9 11/27/2018 1344   MONOABS 1.2 (H) 02/27/2024 0526   EOSABS 0.3 02/27/2024 0526   EOSABS 0.5 (H) 11/27/2018 1344   BASOSABS 0.1 02/27/2024 0526   BASOSABS 0.2 11/27/2018 1344    Hgb A1C Lab Results  Component Value Date   HGBA1C 6.1 (H) 11/21/2023  Assessment & Plan:     RTC in 6 months for your annual exam Angeline Laura, NP

## 2024-05-27 NOTE — Assessment & Plan Note (Addendum)
 Continue filgrastim  injections He will continue to follow with hematology

## 2024-05-27 NOTE — Assessment & Plan Note (Addendum)
 Complicated by obesity Persistently elevated despite repeat check Continue losartan  100 mg and metoprolol  25 mg XR daily He will monitor BP at home for 2 weeks, if consistently >130/80, will add amlodipine  5 mg daily Reinforced DASH diet and exercise for weight loss C-Met today

## 2024-05-27 NOTE — Assessment & Plan Note (Signed)
 Complicated by obesity CMET and lipid profile today Continue atorvastatin  10 mg daily Encouraged him to consume a low fat diet

## 2024-05-27 NOTE — Assessment & Plan Note (Signed)
 Encouraged diet and exercise for weight loss ?

## 2024-05-27 NOTE — Patient Instructions (Signed)

## 2024-05-27 NOTE — Assessment & Plan Note (Signed)
 Complicated by obesity Encouraged low carb diet A1C today

## 2024-05-28 ENCOUNTER — Ambulatory Visit: Payer: Self-pay | Admitting: Internal Medicine

## 2024-05-28 LAB — COMPREHENSIVE METABOLIC PANEL WITH GFR
AG Ratio: 1.6 (calc) (ref 1.0–2.5)
ALT: 34 U/L (ref 9–46)
AST: 55 U/L — ABNORMAL HIGH (ref 10–35)
Albumin: 4.3 g/dL (ref 3.6–5.1)
Alkaline phosphatase (APISO): 98 U/L (ref 35–144)
BUN: 13 mg/dL (ref 7–25)
CO2: 26 mmol/L (ref 20–32)
Calcium: 9.5 mg/dL (ref 8.6–10.3)
Chloride: 103 mmol/L (ref 98–110)
Creat: 0.82 mg/dL (ref 0.70–1.30)
Globulin: 2.7 g/dL (ref 1.9–3.7)
Glucose, Bld: 97 mg/dL (ref 65–99)
Potassium: 4.5 mmol/L (ref 3.5–5.3)
Sodium: 136 mmol/L (ref 135–146)
Total Bilirubin: 0.6 mg/dL (ref 0.2–1.2)
Total Protein: 7 g/dL (ref 6.1–8.1)
eGFR: 103 mL/min/1.73m2 (ref >=60)

## 2024-05-28 LAB — CBC
HCT: 37.2 % — ABNORMAL LOW (ref 38.5–50.0)
Hemoglobin: 11.4 g/dL — ABNORMAL LOW (ref 13.2–17.1)
MCH: 23.8 pg — ABNORMAL LOW (ref 27.0–33.0)
MCHC: 30.6 g/dL — ABNORMAL LOW (ref 32.0–36.0)
MCV: 77.8 fL — ABNORMAL LOW (ref 80.0–100.0)
MPV: 11 fL (ref 7.5–12.5)
Platelets: 198 Thousand/uL (ref 140–400)
RBC: 4.78 Million/uL (ref 4.20–5.80)
RDW: 14 % (ref 11.0–15.0)
WBC: 5.3 Thousand/uL (ref 3.8–10.8)

## 2024-05-28 LAB — LIPID PANEL
Cholesterol: 111 mg/dL (ref <200)
HDL: 19 mg/dL — ABNORMAL LOW (ref >=40)
LDL Cholesterol (Calc): 66 mg/dL
Non-HDL Cholesterol (Calc): 92 mg/dL (ref <130)
Total CHOL/HDL Ratio: 5.8 (calc) — ABNORMAL HIGH (ref <5.0)
Triglycerides: 180 mg/dL — ABNORMAL HIGH (ref <150)

## 2024-05-28 LAB — HEMOGLOBIN A1C
Hgb A1c MFr Bld: 5.8 % — ABNORMAL HIGH (ref <5.7)
Mean Plasma Glucose: 120 mg/dL
eAG (mmol/L): 6.6 mmol/L

## 2024-06-07 ENCOUNTER — Ambulatory Visit: Admitting: Internal Medicine

## 2024-06-07 ENCOUNTER — Ambulatory Visit (INDEPENDENT_AMBULATORY_CARE_PROVIDER_SITE_OTHER): Admitting: Internal Medicine

## 2024-06-07 ENCOUNTER — Encounter: Payer: Self-pay | Admitting: Internal Medicine

## 2024-06-07 VITALS — BP 150/90 | HR 71 | Temp 97.9°F | Ht 68.0 in | Wt 210.0 lb

## 2024-06-07 DIAGNOSIS — R0981 Nasal congestion: Secondary | ICD-10-CM

## 2024-06-07 DIAGNOSIS — B9789 Other viral agents as the cause of diseases classified elsewhere: Secondary | ICD-10-CM

## 2024-06-07 DIAGNOSIS — D700 Congenital agranulocytosis: Secondary | ICD-10-CM | POA: Diagnosis not present

## 2024-06-07 DIAGNOSIS — J329 Chronic sinusitis, unspecified: Secondary | ICD-10-CM | POA: Diagnosis not present

## 2024-06-07 LAB — POC COVID19/FLU A&B COMBO
Covid Antigen, POC: NEGATIVE
Influenza A Antigen, POC: NEGATIVE
Influenza B Antigen, POC: NEGATIVE

## 2024-06-07 MED ORDER — FLUTICASONE PROPIONATE 50 MCG/ACT NA SUSP: 2.0000 | Freq: Two times a day (BID) | NASAL | 0 refills | Status: AC

## 2024-06-07 MED ORDER — AZITHROMYCIN 250 MG PO TABS: ORAL_TABLET | ORAL | 0 refills | Status: AC

## 2024-06-07 MED ORDER — PROMETHAZINE-DM 6.25-15 MG/5ML PO SYRP: 5.0000 mL | ORAL_SOLUTION | Freq: Four times a day (QID) | ORAL | 0 refills | Status: AC | PRN

## 2024-06-07 NOTE — Patient Instructions (Signed)

## 2024-06-07 NOTE — Progress Notes (Signed)
 Subjective:    Patient ID: Brandon Myers, male    DOB: [birth year], 58 y.o.   MRN: [id number]  HPI  Discussed the use of AI scribe software for clinical note transcription with the patient, who gave verbal consent to proceed.  Brandon Myers is a 58 year old male who presents with headache and sinus pressure.  He has been experiencing headache and sinus pressure for the past week, approximately four to five days. He describes having a runny nose and nasal congestion with a slight yellow discoloration. No ear pain or sore throat, but he mentions coughing up brown sputum and experiencing a little shortness of breath.  Earlier in the week, he had diarrhea, specifically on Monday. He also feels achy but denies having a fever or chills. He was treated for a sinus infection with Augmentin  three weeks ago, which initially improved his symptoms, but they have since returned.     Review of Systems     Past Medical History:  Diagnosis Date   Allergy    Anemia 03/03/2022   Anxiety 01/21/2020   Controlled substance agreement signed 04/02/2017   GERD (gastroesophageal reflux disease)    Hypertension    Neutropenia, congenital (HCC)    Phantom limb pain (HCC)     Current Outpatient Medications  Medication Sig Dispense Refill   albuterol  (VENTOLIN  HFA) 108 (90 Base) MCG/ACT inhaler Inhale 2 puffs into the lungs every 6 (six) hours as needed for wheezing or shortness of breath. 8 g 0   amoxicillin -clavulanate (AUGMENTIN ) 875-125 MG tablet Take 1 tablet by mouth 2 (two) times daily. 20 tablet 0   atorvastatin  (LIPITOR) 10 MG tablet TAKE 1 TABLET(10 MG) BY MOUTH DAILY 90 tablet 1   filgrastim -sndz (ZARXIO ) 480 MCG/0.8ML SOSY injection Inject 0.8 mLs into the muscle every other day.     gabapentin  (NEURONTIN ) 600 MG tablet Take 1 tablet (600 mg total) by mouth 3 (three) times daily. 270 tablet 0   loratadine  (CLARITIN ) 10 MG tablet Take 1 tablet (10 mg total) by mouth daily. TAKE 1 TABLET(10 MG) BY  MOUTH DAILY 90 tablet 3   losartan  (COZAAR ) 100 MG tablet TAKE 1 TABLET(100 MG) BY MOUTH DAILY 90 tablet 0   metoprolol  succinate (TOPROL -XL) 50 MG 24 hr tablet TAKE 1 TABLET BY MOUTH EVERY DAY AFTER A MEAL 90 tablet 1   omeprazole  (PRILOSEC) 40 MG capsule TAKE 1 CAPSULE(40 MG) BY MOUTH DAILY 90 capsule 0   No current facility-administered medications for this visit.    Allergies  Allergen Reactions   Enalapril  Cough    ACEi Cough   Hydrochlorothiazide      Sweats, hot flashes   Prochlorperazine     Other reaction(s): Other (See Comments) As a child unable to recall   Sulfa Antibiotics Other (See Comments)    Other reaction(s): Other (See Comments) As a child unable to recall   Benadryl [Diphenhydramine]     Increased phantom pain   Strawberry Extract Rash    Family History  Problem Relation Age of Onset   Heart Problems Mother    Asthma Mother    Depression Mother    Thyroid  disease Mother    Alzheimer's disease Mother    Hypertension Mother    Stroke Maternal Grandmother    Cancer Maternal Grandmother    Cancer Father        skin   Hypertension Father    Cancer Paternal Grandmother    Cancer Paternal Grandfather     Social  History   Socioeconomic History   Marital status: Married    Spouse name: Denario Bagot   Number of children: 2   Years of education: Not on file   Highest education level: Associate degree: occupational, scientist, product/process development, or vocational program  Occupational History   Occupation: Disabled  Tobacco Use   Smoking status: Never   Smokeless tobacco: Never  Vaping Use   Vaping status: Never Used  Substance and Sexual Activity   Alcohol use: Never   Drug use: Never   Sexual activity: Not Currently  Other Topics Concern   Not on file  Social History Narrative   Not on file   Social Drivers of Health   Financial Resource Strain: Low Risk  (04/10/2024)   Overall Financial Resource Strain (CARDIA)    Difficulty of Paying Living Expenses: Not very  hard  Food Insecurity: Food Insecurity Present (04/10/2024)   Hunger Vital Sign    Worried About Running Out of Food in the Last Year: Sometimes true    Ran Out of Food in the Last Year: Sometimes true  Transportation Needs: No Transportation Needs (04/10/2024)   PRAPARE - Administrator, Civil Service (Medical): No    Lack of Transportation (Non-Medical): No  Physical Activity: Inactive (04/10/2024)   Exercise Vital Sign    Days of Exercise per Week: 0 days    Minutes of Exercise per Session: Not on file  Stress: No Stress Concern Present (04/10/2024)   [employer] of Occupational Health - Occupational Stress Questionnaire    Feeling of Stress: Only a little  Social Connections: Moderately Isolated (04/10/2024)   Social Connection and Isolation Panel    Frequency of Communication with Friends and Family: Once a week    Frequency of Social Gatherings with Friends and Family: Once a week    Attends Religious Services: More than 4 times per year    Active Member of [employer] or Organizations: No    Attends Banker Meetings: Not on file    Marital Status: Married  Catering Manager Violence: Not At Risk (02/27/2024)   Humiliation, Afraid, Rape, and Kick questionnaire    Fear of Current or Ex-Partner: No    Emotionally Abused: No    Physically Abused: No    Sexually Abused: No     Constitutional: Pt reports headache. Denies fever, malaise, fatigue, or abrupt weight changes.  HEENT: Pt reports sinus pressure, runny nose, and nasal congestion. Denies eye pain, eye redness, ear pain, ringing in the ears, wax buildup, runny nose, bloody nose, or sore throat. Respiratory: Pt reports cough, shortness of breath. Denies difficulty breathing, or sputum production.   Cardiovascular: Pt reports chest tightness. Denies chest pain, palpitations or swelling in the hands or feet.  Gastrointestinal: Patient reports diarrhea.  Denies abdominal pain, bloating, constipation, or blood  in the stool.  Musculoskeletal: Patient reports chronic joint pain.  Denies decrease in range of motion, difficulty with gait, muscle pain or joint swelling.  Neurological: Patient reports neuropathic pain.  Denies dizziness, difficulty with memory, difficulty with speech or problems with balance and coordination.   No other specific complaints in a complete review of systems (except as listed in HPI above).  Objective:   Physical Exam BP (!) 152/90 (BP Location: Left Arm, Patient Position: Sitting, Cuff Size: Normal)   Pulse 71   Temp 97.9 F (36.6 C)   Ht 5' 8 (1.727 m)   Wt 210 lb (95.3 kg)   SpO2 98%   BMI  31.93 kg/m     Wt Readings from Last 3 Encounters:  05/27/24 210 lb 3.2 oz (95.3 kg)  04/11/24 212 lb 9.6 oz (96.4 kg)  03/13/24 201 lb (91.2 kg)    General: Appears his stated age, obese, in NAD. Skin: Warm and clammy. HEENT: Head: normal shape and size, no sinus tenderness noted; Eyes: sclera white, no icterus, conjunctiva pink, PERRLA and EOMs intact; Nose: Mucosa boggy and moist, turbinates swollen; Throat: mucosa pink and moist, + PND, no tonsillar erythema, exudate or lesions noted. Neck: No adenopathy noted. Cardiovascular: Tachycardic with rhythm. S1,S2 noted.  No murmur, rubs or gallops noted.  Pulmonary/Chest: Normal effort and positive vesicular breath sounds. No respiratory distress. No wheezes, rales or ronchi noted.  Neurological: Alert and oriented.   BMET    Component Value Date/Time   NA 136 05/27/2024 1423   NA 139 12/23/2019 1447   K 4.5 05/27/2024 1423   CL 103 05/27/2024 1423   CO2 26 05/27/2024 1423   GLUCOSE 97 05/27/2024 1423   BUN 13 05/27/2024 1423   BUN 13 12/23/2019 1447   CREATININE 0.82 05/27/2024 1423   CALCIUM  9.5 05/27/2024 1423   GFRNONAA >60 02/27/2024 0526   GFRNONAA >89 12/09/2016 0934   GFRAA 116 12/23/2019 1447   GFRAA >89 12/09/2016 0934    Lipid Panel     Component Value Date/Time   CHOL 111 05/27/2024 1423    CHOL 139 12/23/2019 1447   TRIG 180 (H) 05/27/2024 1423   HDL 19 (L) 05/27/2024 1423   HDL 23 (L) 12/23/2019 1447   CHOLHDL 5.8 (H) 05/27/2024 1423   VLDL 29 12/09/2016 0934   LDLCALC 66 05/27/2024 1423    CBC    Component Value Date/Time   WBC 5.3 05/27/2024 1423   RBC 4.78 05/27/2024 1423   HGB 11.4 (L) 05/27/2024 1423   HGB 14.1 11/27/2018 1344   HCT 37.2 (L) 05/27/2024 1423   HCT 41.8 11/27/2018 1344   PLT 198 05/27/2024 1423   PLT 200 11/27/2018 1344   MCV 77.8 (L) 05/27/2024 1423   MCV 83 11/27/2018 1344   MCH 23.8 (L) 05/27/2024 1423   MCHC 30.6 (L) 05/27/2024 1423   RDW 14.0 05/27/2024 1423   RDW 13.1 11/27/2018 1344   LYMPHSABS 2.0 02/27/2024 0526   LYMPHSABS 1.9 11/27/2018 1344   MONOABS 1.2 (H) 02/27/2024 0526   EOSABS 0.3 02/27/2024 0526   EOSABS 0.5 (H) 11/27/2018 1344   BASOSABS 0.1 02/27/2024 0526   BASOSABS 0.2 11/27/2018 1344    Hgb A1C Lab Results  Component Value Date   HGBA1C 5.8 (H) 05/27/2024           Assessment & Plan:   Assessment and Plan    Viral sinusitis Likely viral etiology.  Chronic neutropenia present. Antibiotics not indicated. Prednisone  avoided due to past adverse effects. - Prescribed Flonase  nasal spray, 1 spray twice daily x 1 week. - Prescribed promethazine  DM 6.25-15 mg cough syrup. - Prescribed azithromycin  250 mg x 5 days for potential worsening, to be taken only if fever develops.       RTC in 5 months for your annual exam Angeline Laura, NP

## 2024-07-03 ENCOUNTER — Other Ambulatory Visit: Payer: Self-pay | Admitting: Internal Medicine

## 2024-07-05 ENCOUNTER — Other Ambulatory Visit: Payer: Self-pay

## 2024-07-05 MED ORDER — GABAPENTIN 600 MG PO TABS: 600.0000 mg | ORAL_TABLET | Freq: Three times a day (TID) | ORAL | 0 refills | Status: AC

## 2024-07-05 NOTE — Telephone Encounter (Signed)
 Duplicate request, refilled 07/05/24.  Requested Prescriptions  Pending Prescriptions Disp Refills   gabapentin  (NEURONTIN ) 600 MG tablet [Pharmacy Med Name: GABAPENTIN  600MG  TABLETS] 270 tablet 0    Sig: TAKE 1 TABLET(600 MG) BY MOUTH THREE TIMES DAILY     Neurology: Anticonvulsants - gabapentin  Passed - 07/05/2024  1:34 PM      Passed - Cr in normal range and within 360 days    Creat  Date Value Ref Range Status  05/27/2024 0.82 0.70 - 1.30 mg/dL Final         Passed - Completed PHQ-2 or PHQ-9 in the last 360 days      Passed - Valid encounter within last 12 months    Recent Outpatient Visits           4 weeks ago Nasal congestion   [place] [facility] [place], Angeline ORN, NP   1 month ago Prediabetes   [place] [facility] [place], Angeline ORN, NP   1 month ago Acute bacterial sinusitis   [place] [facility] [place], Angeline ORN, NP   2 months ago Cellulitis of left lower extremity   [place] [facility] [place], Angeline ORN, NP   7 months ago Encounter for general adult medical examination with abnormal findings    [facility] [place], Angeline ORN, NP

## 2024-07-22 ENCOUNTER — Other Ambulatory Visit: Payer: Self-pay | Admitting: Internal Medicine

## 2024-07-22 DIAGNOSIS — K219 Gastro-esophageal reflux disease without esophagitis: Secondary | ICD-10-CM

## 2024-07-24 NOTE — Telephone Encounter (Signed)
 Requested by interface surescripts. Future visit 11/21/24. Requested Prescriptions  Pending Prescriptions Disp Refills   omeprazole  (PRILOSEC) 40 MG capsule [Pharmacy Med Name: OMEPRAZOLE  40MG  CAPSULES] 90 capsule 0    Sig: TAKE 1 CAPSULE(40 MG) BY MOUTH DAILY     Gastroenterology: Proton Pump Inhibitors Passed - 07/24/2024 11:18 AM      Passed - Valid encounter within last 12 months    Recent Outpatient Visits           1 month ago Nasal congestion   [place] [facility] [place], Angeline ORN, NP   1 month ago Prediabetes   [place] [facility] [place], Angeline ORN, NP   2 months ago Acute bacterial sinusitis   [place] [facility] [place], Angeline ORN, NP   3 months ago Cellulitis of left lower extremity   [place] [facility] [place], Angeline ORN, NP   8 months ago Encounter for general adult medical examination with abnormal findings   [place] [facility] [place], Angeline ORN, NP

## 2024-07-31 ENCOUNTER — Other Ambulatory Visit: Payer: Self-pay | Admitting: Internal Medicine

## 2024-07-31 DIAGNOSIS — I10 Essential (primary) hypertension: Secondary | ICD-10-CM

## 2024-08-01 NOTE — Telephone Encounter (Signed)
 Requested Prescriptions  Pending Prescriptions Disp Refills   losartan  (COZAAR ) 100 MG tablet [Pharmacy Med Name: LOSARTAN  100MG  TABLETS] 90 tablet 1    Sig: TAKE 1 TABLET(100 MG) BY MOUTH DAILY     Cardiovascular:  Angiotensin Receptor Blockers Failed - 08/01/2024 11:16 AM      Failed - Last BP in normal range    BP Readings from Last 1 Encounters:  06/07/24 (!) 150/90         Passed - Cr in normal range and within 180 days    Creat  Date Value Ref Range Status  05/27/2024 0.82 0.70 - 1.30 mg/dL Final         Passed - K in normal range and within 180 days    Potassium  Date Value Ref Range Status  05/27/2024 4.5 3.5 - 5.3 mmol/L Final         Passed - Patient is not pregnant      Passed - Valid encounter within last 6 months    Recent Outpatient Visits           1 month ago Nasal congestion   [place] [facility] [place], Angeline ORN, NP   2 months ago Prediabetes   [place] [facility] [place], [place], NP   2 months ago Acute bacterial sinusitis   [place] [facility] [place], Angeline ORN, NP   3 months ago Cellulitis of left lower extremity   [place] [facility] [place], Angeline ORN, NP   8 months ago Encounter for general adult medical examination with abnormal findings    [place] [facility] [place], Angeline ORN, NP

## 2024-11-21 ENCOUNTER — Encounter: Admitting: Internal Medicine
